# Patient Record
Sex: Female | Born: 1935 | Race: Black or African American | Hispanic: No | Smoking: Former smoker
Health system: Southern US, Community
[De-identification: ages and names within clinical notes are randomized; demographics above are authoritative.]

## PROBLEM LIST (undated history)

## (undated) DIAGNOSIS — I639 Cerebral infarction, unspecified: Secondary | ICD-10-CM

## (undated) DIAGNOSIS — E119 Type 2 diabetes mellitus without complications: Secondary | ICD-10-CM

## (undated) DIAGNOSIS — I10 Essential (primary) hypertension: Secondary | ICD-10-CM

---

## 2000-04-07 ENCOUNTER — Encounter: Payer: Self-pay | Admitting: Neurosurgery

## 2000-04-07 ENCOUNTER — Inpatient Hospital Stay (HOSPITAL_COMMUNITY): Admission: RE | Admit: 2000-04-07 | Discharge: 2000-04-08 | Payer: Self-pay | Admitting: Neurosurgery

## 2000-05-10 ENCOUNTER — Ambulatory Visit (HOSPITAL_COMMUNITY): Admission: RE | Admit: 2000-05-10 | Discharge: 2000-05-10 | Payer: Self-pay | Admitting: Neurosurgery

## 2000-05-10 ENCOUNTER — Encounter: Payer: Self-pay | Admitting: Neurosurgery

## 2006-04-11 ENCOUNTER — Ambulatory Visit: Payer: Self-pay | Admitting: Ophthalmology

## 2006-04-17 ENCOUNTER — Ambulatory Visit: Payer: Self-pay | Admitting: Ophthalmology

## 2006-12-21 ENCOUNTER — Other Ambulatory Visit: Payer: Self-pay

## 2006-12-21 ENCOUNTER — Inpatient Hospital Stay: Payer: Self-pay | Admitting: Internal Medicine

## 2007-01-01 ENCOUNTER — Ambulatory Visit: Payer: Self-pay | Admitting: Ophthalmology

## 2007-01-08 ENCOUNTER — Ambulatory Visit: Payer: Self-pay | Admitting: Ophthalmology

## 2008-03-24 ENCOUNTER — Other Ambulatory Visit: Payer: Self-pay

## 2008-03-24 ENCOUNTER — Emergency Department: Payer: Self-pay | Admitting: Emergency Medicine

## 2009-04-21 ENCOUNTER — Inpatient Hospital Stay: Payer: Self-pay | Admitting: Internal Medicine

## 2013-05-23 ENCOUNTER — Ambulatory Visit: Payer: Self-pay

## 2013-06-18 ENCOUNTER — Ambulatory Visit: Payer: Self-pay

## 2014-02-04 ENCOUNTER — Emergency Department: Payer: Self-pay | Admitting: Emergency Medicine

## 2014-02-04 LAB — CBC WITH DIFFERENTIAL/PLATELET
Basophil #: 0 10*3/uL (ref 0.0–0.1)
Basophil %: 1.2 %
Eosinophil #: 0.1 10*3/uL (ref 0.0–0.7)
Eosinophil %: 2.9 %
HCT: 35.8 % (ref 35.0–47.0)
HGB: 11.5 g/dL — ABNORMAL LOW (ref 12.0–16.0)
Lymphocyte #: 1.5 10*3/uL (ref 1.0–3.6)
Lymphocyte %: 35.8 %
MCH: 29.7 pg (ref 26.0–34.0)
MCHC: 32.1 g/dL (ref 32.0–36.0)
MCV: 92 fL (ref 80–100)
Monocyte #: 0.3 x10 3/mm (ref 0.2–0.9)
Monocyte %: 6.3 %
Neutrophil #: 2.3 10*3/uL (ref 1.4–6.5)
Neutrophil %: 53.8 %
Platelet: 174 10*3/uL (ref 150–440)
RBC: 3.88 10*6/uL (ref 3.80–5.20)
RDW: 15.5 % — ABNORMAL HIGH (ref 11.5–14.5)
WBC: 4.3 10*3/uL (ref 3.6–11.0)

## 2014-02-04 LAB — URINALYSIS, COMPLETE
Glucose,UR: >=500 mg/dL (ref 0–75)
Ketone: NEGATIVE
Nitrite: NEGATIVE
Ph: 7 (ref 4.5–8.0)
Protein: 30
RBC,UR: 1 /HPF (ref 0–5)
Specific Gravity: 1.005 (ref 1.003–1.030)
Squamous Epithelial: 1
WBC UR: 4 /HPF (ref 0–5)

## 2014-02-04 LAB — BASIC METABOLIC PANEL
Anion Gap: 4 — ABNORMAL LOW (ref 7–16)
BUN: 12 mg/dL (ref 7–18)
Calcium, Total: 9.3 mg/dL (ref 8.5–10.1)
Chloride: 108 mmol/L — ABNORMAL HIGH (ref 98–107)
Co2: 29 mmol/L (ref 21–32)
Creatinine: 1.06 mg/dL (ref 0.60–1.30)
EGFR (African American): 59 — ABNORMAL LOW
EGFR (Non-African Amer.): 51 — ABNORMAL LOW
Glucose: 225 mg/dL — ABNORMAL HIGH (ref 65–99)
Osmolality: 288 (ref 275–301)
Potassium: 4.3 mmol/L (ref 3.5–5.1)
Sodium: 141 mmol/L (ref 136–145)

## 2014-02-04 LAB — PRO B NATRIURETIC PEPTIDE: B-Type Natriuretic Peptide: 175 pg/mL (ref 0–450)

## 2014-02-04 LAB — TROPONIN I: Troponin-I: <0.02 ng/mL

## 2014-08-20 DIAGNOSIS — E1140 Type 2 diabetes mellitus with diabetic neuropathy, unspecified: Secondary | ICD-10-CM | POA: Insufficient documentation

## 2014-08-20 DIAGNOSIS — R42 Dizziness and giddiness: Secondary | ICD-10-CM | POA: Insufficient documentation

## 2014-08-20 DIAGNOSIS — R200 Anesthesia of skin: Secondary | ICD-10-CM | POA: Insufficient documentation

## 2014-08-20 DIAGNOSIS — E119 Type 2 diabetes mellitus without complications: Secondary | ICD-10-CM | POA: Insufficient documentation

## 2014-08-20 DIAGNOSIS — E669 Obesity, unspecified: Secondary | ICD-10-CM | POA: Insufficient documentation

## 2014-08-27 DIAGNOSIS — R262 Difficulty in walking, not elsewhere classified: Secondary | ICD-10-CM | POA: Insufficient documentation

## 2014-12-15 ENCOUNTER — Ambulatory Visit: Payer: Self-pay | Admitting: Internal Medicine

## 2014-12-29 ENCOUNTER — Encounter: Payer: Self-pay | Admitting: Internal Medicine

## 2015-01-21 DIAGNOSIS — Z8673 Personal history of transient ischemic attack (TIA), and cerebral infarction without residual deficits: Secondary | ICD-10-CM | POA: Insufficient documentation

## 2015-02-10 ENCOUNTER — Emergency Department: Payer: Self-pay | Admitting: Emergency Medicine

## 2015-02-19 ENCOUNTER — Ambulatory Visit: Payer: Self-pay | Admitting: Vascular Surgery

## 2015-05-20 ENCOUNTER — Ambulatory Visit: Payer: Medicare HMO | Attending: Internal Medicine

## 2015-05-20 DIAGNOSIS — E118 Type 2 diabetes mellitus with unspecified complications: Secondary | ICD-10-CM | POA: Insufficient documentation

## 2015-05-20 DIAGNOSIS — H539 Unspecified visual disturbance: Secondary | ICD-10-CM | POA: Insufficient documentation

## 2015-05-20 DIAGNOSIS — G4733 Obstructive sleep apnea (adult) (pediatric): Secondary | ICD-10-CM | POA: Diagnosis not present

## 2015-05-20 DIAGNOSIS — R400 Somnolence: Secondary | ICD-10-CM | POA: Diagnosis present

## 2015-08-31 DIAGNOSIS — E1165 Type 2 diabetes mellitus with hyperglycemia: Secondary | ICD-10-CM | POA: Diagnosis not present

## 2015-08-31 DIAGNOSIS — Z8673 Personal history of transient ischemic attack (TIA), and cerebral infarction without residual deficits: Secondary | ICD-10-CM | POA: Diagnosis not present

## 2015-08-31 DIAGNOSIS — I10 Essential (primary) hypertension: Secondary | ICD-10-CM | POA: Diagnosis not present

## 2015-08-31 DIAGNOSIS — H539 Unspecified visual disturbance: Secondary | ICD-10-CM | POA: Diagnosis not present

## 2015-08-31 DIAGNOSIS — E118 Type 2 diabetes mellitus with unspecified complications: Secondary | ICD-10-CM | POA: Diagnosis not present

## 2015-09-01 DIAGNOSIS — I10 Essential (primary) hypertension: Secondary | ICD-10-CM | POA: Diagnosis not present

## 2015-09-01 DIAGNOSIS — Z8673 Personal history of transient ischemic attack (TIA), and cerebral infarction without residual deficits: Secondary | ICD-10-CM | POA: Diagnosis not present

## 2015-09-01 DIAGNOSIS — Z794 Long term (current) use of insulin: Secondary | ICD-10-CM | POA: Diagnosis not present

## 2015-09-01 DIAGNOSIS — E118 Type 2 diabetes mellitus with unspecified complications: Secondary | ICD-10-CM | POA: Diagnosis not present

## 2015-09-01 DIAGNOSIS — H539 Unspecified visual disturbance: Secondary | ICD-10-CM | POA: Diagnosis not present

## 2015-09-03 DIAGNOSIS — I10 Essential (primary) hypertension: Secondary | ICD-10-CM | POA: Diagnosis not present

## 2015-09-03 DIAGNOSIS — Z794 Long term (current) use of insulin: Secondary | ICD-10-CM | POA: Diagnosis not present

## 2015-09-03 DIAGNOSIS — E118 Type 2 diabetes mellitus with unspecified complications: Secondary | ICD-10-CM | POA: Diagnosis not present

## 2015-09-03 DIAGNOSIS — Z8673 Personal history of transient ischemic attack (TIA), and cerebral infarction without residual deficits: Secondary | ICD-10-CM | POA: Diagnosis not present

## 2015-09-03 DIAGNOSIS — R262 Difficulty in walking, not elsewhere classified: Secondary | ICD-10-CM | POA: Diagnosis not present

## 2015-10-05 DIAGNOSIS — E1140 Type 2 diabetes mellitus with diabetic neuropathy, unspecified: Secondary | ICD-10-CM | POA: Diagnosis not present

## 2015-10-05 DIAGNOSIS — I119 Hypertensive heart disease without heart failure: Secondary | ICD-10-CM | POA: Diagnosis not present

## 2015-11-27 ENCOUNTER — Ambulatory Visit
Admission: RE | Admit: 2015-11-27 | Discharge: 2015-11-27 | Disposition: A | Discharge status: Home / Self Care | Payer: Commercial Managed Care - HMO | Source: Ambulatory Visit | Attending: Physician Assistant | Admitting: Physician Assistant

## 2015-11-27 ENCOUNTER — Other Ambulatory Visit: Payer: Self-pay | Admitting: Physician Assistant

## 2015-11-27 DIAGNOSIS — R600 Localized edema: Secondary | ICD-10-CM | POA: Insufficient documentation

## 2015-11-27 DIAGNOSIS — M79604 Pain in right leg: Secondary | ICD-10-CM

## 2016-01-01 DIAGNOSIS — Z8673 Personal history of transient ischemic attack (TIA), and cerebral infarction without residual deficits: Secondary | ICD-10-CM | POA: Diagnosis not present

## 2016-01-01 DIAGNOSIS — I10 Essential (primary) hypertension: Secondary | ICD-10-CM | POA: Diagnosis not present

## 2016-01-01 DIAGNOSIS — E118 Type 2 diabetes mellitus with unspecified complications: Secondary | ICD-10-CM | POA: Diagnosis not present

## 2016-01-01 DIAGNOSIS — R262 Difficulty in walking, not elsewhere classified: Secondary | ICD-10-CM | POA: Diagnosis not present

## 2016-01-19 DIAGNOSIS — I10 Essential (primary) hypertension: Secondary | ICD-10-CM | POA: Diagnosis not present

## 2016-01-19 DIAGNOSIS — N183 Chronic kidney disease, stage 3 (moderate): Secondary | ICD-10-CM | POA: Diagnosis not present

## 2016-01-19 DIAGNOSIS — Z794 Long term (current) use of insulin: Secondary | ICD-10-CM | POA: Diagnosis not present

## 2016-01-19 DIAGNOSIS — R200 Anesthesia of skin: Secondary | ICD-10-CM | POA: Diagnosis not present

## 2016-01-19 DIAGNOSIS — E1122 Type 2 diabetes mellitus with diabetic chronic kidney disease: Secondary | ICD-10-CM | POA: Diagnosis not present

## 2016-01-19 DIAGNOSIS — Z8673 Personal history of transient ischemic attack (TIA), and cerebral infarction without residual deficits: Secondary | ICD-10-CM | POA: Diagnosis not present

## 2016-01-19 DIAGNOSIS — E118 Type 2 diabetes mellitus with unspecified complications: Secondary | ICD-10-CM | POA: Diagnosis not present

## 2016-01-19 DIAGNOSIS — I739 Peripheral vascular disease, unspecified: Secondary | ICD-10-CM | POA: Diagnosis not present

## 2016-02-02 DIAGNOSIS — E113553 Type 2 diabetes mellitus with stable proliferative diabetic retinopathy, bilateral: Secondary | ICD-10-CM | POA: Diagnosis not present

## 2016-02-25 DIAGNOSIS — M6281 Muscle weakness (generalized): Secondary | ICD-10-CM | POA: Diagnosis not present

## 2016-02-26 ENCOUNTER — Emergency Department
Admission: EM | Admit: 2016-02-26 | Discharge: 2016-02-26 | Disposition: A | Discharge status: Home / Self Care | Payer: Commercial Managed Care - HMO | Attending: Emergency Medicine | Admitting: Emergency Medicine

## 2016-02-26 ENCOUNTER — Emergency Department: Payer: Commercial Managed Care - HMO

## 2016-02-26 ENCOUNTER — Encounter: Payer: Self-pay | Admitting: Emergency Medicine

## 2016-02-26 DIAGNOSIS — S80212A Abrasion, left knee, initial encounter: Secondary | ICD-10-CM | POA: Insufficient documentation

## 2016-02-26 DIAGNOSIS — Y998 Other external cause status: Secondary | ICD-10-CM | POA: Insufficient documentation

## 2016-02-26 DIAGNOSIS — W1839XA Other fall on same level, initial encounter: Secondary | ICD-10-CM | POA: Insufficient documentation

## 2016-02-26 DIAGNOSIS — S0990XA Unspecified injury of head, initial encounter: Secondary | ICD-10-CM | POA: Diagnosis not present

## 2016-02-26 DIAGNOSIS — W19XXXA Unspecified fall, initial encounter: Secondary | ICD-10-CM

## 2016-02-26 DIAGNOSIS — Y92009 Unspecified place in unspecified non-institutional (private) residence as the place of occurrence of the external cause: Secondary | ICD-10-CM | POA: Insufficient documentation

## 2016-02-26 DIAGNOSIS — E119 Type 2 diabetes mellitus without complications: Secondary | ICD-10-CM | POA: Insufficient documentation

## 2016-02-26 DIAGNOSIS — R531 Weakness: Secondary | ICD-10-CM | POA: Insufficient documentation

## 2016-02-26 DIAGNOSIS — Z87891 Personal history of nicotine dependence: Secondary | ICD-10-CM | POA: Insufficient documentation

## 2016-02-26 DIAGNOSIS — I10 Essential (primary) hypertension: Secondary | ICD-10-CM | POA: Insufficient documentation

## 2016-02-26 DIAGNOSIS — I6782 Cerebral ischemia: Secondary | ICD-10-CM | POA: Diagnosis not present

## 2016-02-26 DIAGNOSIS — Y9389 Activity, other specified: Secondary | ICD-10-CM | POA: Insufficient documentation

## 2016-02-26 HISTORY — DX: Essential (primary) hypertension: I10

## 2016-02-26 HISTORY — DX: Type 2 diabetes mellitus without complications: E11.9

## 2016-02-26 HISTORY — DX: Cerebral infarction, unspecified: I63.9

## 2016-02-26 LAB — URINALYSIS COMPLETE WITH MICROSCOPIC (ARMC ONLY)
Bacteria, UA: NONE SEEN
Bilirubin Urine: NEGATIVE
Glucose, UA: >500 mg/dL — AB
KETONES UR: NEGATIVE mg/dL
LEUKOCYTES UA: NEGATIVE
Nitrite: NEGATIVE
PH: 6 (ref 5.0–8.0)
PROTEIN: NEGATIVE mg/dL
SPECIFIC GRAVITY, URINE: 1.007 (ref 1.005–1.030)

## 2016-02-26 LAB — CBC
HCT: 38.4 % (ref 35.0–47.0)
Hemoglobin: 12.6 g/dL (ref 12.0–16.0)
MCH: 29.7 pg (ref 26.0–34.0)
MCHC: 32.9 g/dL (ref 32.0–36.0)
MCV: 90.3 fL (ref 80.0–100.0)
PLATELETS: 167 10*3/uL (ref 150–440)
RBC: 4.26 MIL/uL (ref 3.80–5.20)
RDW: 15 % — AB (ref 11.5–14.5)
WBC: 4.7 10*3/uL (ref 3.6–11.0)

## 2016-02-26 LAB — COMPREHENSIVE METABOLIC PANEL
ALBUMIN: 3.6 g/dL (ref 3.5–5.0)
ALT: 17 U/L (ref 14–54)
AST: 20 U/L (ref 15–41)
Alkaline Phosphatase: 84 U/L (ref 38–126)
Anion gap: 5 (ref 5–15)
BUN: 23 mg/dL — AB (ref 6–20)
CHLORIDE: 104 mmol/L (ref 101–111)
CO2: 30 mmol/L (ref 22–32)
Calcium: 9.3 mg/dL (ref 8.9–10.3)
Creatinine, Ser: 1.08 mg/dL — ABNORMAL HIGH (ref 0.44–1.00)
GFR calc Af Amer: 55 mL/min — ABNORMAL LOW (ref >60)
GFR calc non Af Amer: 47 mL/min — ABNORMAL LOW (ref >60)
GLUCOSE: 126 mg/dL — AB (ref 65–99)
POTASSIUM: 4 mmol/L (ref 3.5–5.1)
Sodium: 139 mmol/L (ref 135–145)
Total Bilirubin: 0.7 mg/dL (ref 0.3–1.2)
Total Protein: 7.6 g/dL (ref 6.5–8.1)

## 2016-02-26 LAB — TROPONIN I
Troponin I: 0.04 ng/mL — ABNORMAL HIGH (ref <0.031)
Troponin I: 0.03 ng/mL (ref <0.031)

## 2016-02-26 NOTE — Discharge Instructions (Signed)
Weakness °Weakness is a lack of strength. You may feel weak all over your body or just in one part of your body. Weakness can be serious. In some cases, you may need more medical tests. °HOME CARE °· Rest. °· Eat a well-balanced diet. °· Try to exercise every day. °· Only take medicines as told by your doctor. °GET HELP RIGHT AWAY IF:  °· You cannot do your normal daily activities. °· You cannot walk up and down stairs, or you feel very tired when you do so. °· You have shortness of breath or chest pain. °· You have trouble moving parts of your body. °· You have weakness in only one body part or on only one side of the body. °· You have a fever. °· You have trouble speaking or swallowing. °· You cannot control when you pee (urinate) or poop (bowel movement). °· You have black or bloody throw up (vomit) or poop. °· Your weakness gets worse or spreads to other body parts. °· You have new aches or pains. °MAKE SURE YOU:  °· Understand these instructions. °· Will watch your condition. °· Will get help right away if you are not doing well or get worse. °  °This information is not intended to replace advice given to you by your health care provider. Make sure you discuss any questions you have with your health care provider. °  °Document Released: 11/17/2008 Document Revised: 06/05/2012 Document Reviewed: 02/03/2012 °Elsevier Interactive Patient Education ©2016 [employer] ° °

## 2016-02-26 NOTE — ED Notes (Signed)
Pts daughter called for pickup and wheelchair.

## 2016-02-26 NOTE — ED Notes (Signed)
Crystal from [employer] here to take pt home, awaiting clean scrubs from supply chain

## 2016-02-26 NOTE — ED Notes (Signed)
Pt arrived by EMS from home post fall and generalized weakness. EMS reports pt fell tonight onto wooden floor, pt and pts daughter were unable to get out of floor, EMS was called. Pt reports she has had generalized weakness, mostly in legs and knees. EMS states pt had a stroke 2 months ago. Pt is 98% RA on arrival.

## 2016-02-26 NOTE — ED Notes (Signed)
Continuing to attempt to reach daughter for pt's discharge

## 2016-02-26 NOTE — ED Notes (Signed)
Dr. Gwinda PassePadachowski aware of BP readings.

## 2016-02-26 NOTE — ED Notes (Signed)
Pt resting quietly, no distress noted, cont to monitor 

## 2016-02-26 NOTE — ED Provider Notes (Signed)
[facility] Emergency Department Provider Note  Time seen: 1:37 AM  I have reviewed the triage vital signs and the nursing notes.   HISTORY  Chief Complaint Fall and Weakness    HPI [name] [name] is a 82 y.o. female with a past medical history of diabetes, hypertension, CVA 5 months ago affecting her left side presents the emergency department after a fall. According to the patient for the past 5 months since her stroke she has continued to feel weak on the left side. Patient states today she was attempting to walk when she fell down to her knees. Denies hitting her head or any loss of consciousness. Patient's family was unable to get her up off the floor so they called EMS. Patient denies any new weakness, states her weakness has not improved since her stroke. She saw her primary care physician on Monday, and they were discussing possible rehabilitation or home health per patient.Patient denies any headache, chest or abdominal pain. She does state mild left knee pain.     Past Medical History  Diagnosis Date  . Stroke (HCC)   . Diabetes mellitus without complication (HCC)   . Hypertension     There are no active problems to display for this patient.   History reviewed. No pertinent past surgical history.  No current outpatient prescriptions on file.  Allergies Review of patient's allergies indicates not on file.  History reviewed. No pertinent family history.  Social History Social History  Substance Use Topics  . Smoking status: Former Smoker    Types: Cigarettes  . Smokeless tobacco: None  . Alcohol Use: No    Review of Systems Constitutional: Negative for fever. Cardiovascular: Negative for chest pain. Respiratory: Negative for shortness of breath. Gastrointestinal: Negative for abdominal pain Musculoskeletal: Mild left knee pain Skin: Abrasion to left knee Neurological: Negative for . Weakness on the left side which she states is  unchanged since her stroke approximate 5 months ago 10-point ROS otherwise negative.  ____________________________________________   PHYSICAL EXAM:  VITAL SIGNS: ED Triage Vitals  Enc Vitals Group     BP 02/26/16 0123 173/99 mmHg     Pulse Rate 02/26/16 0123 87     Resp 02/26/16 0123 16     Temp 02/26/16 0123 98.4 F (36.9 C)     Temp Source 02/26/16 0123 Oral     SpO2 02/26/16 0113 95 %     Weight 02/26/16 0123 165 lb (74.844 kg)     Height 02/26/16 0123 5\' 2"  (1.575 m)     Head Cir --      Peak Flow --      Pain Score --      Pain Loc --      Pain Edu? --      Excl. in [place]? --     Constitutional: Alert.  Well appearing and in no distress. Eyes: Normal exam ENT   Head: Normocephalic and atraumatic.   Mouth/Throat: Mucous membranes are moist. Cardiovascular: Normal rate, regular rhythm Respiratory: Normal respiratory effort without tachypnea nor retractions. Breath sounds are clear Gastrointestinal: Soft and nontender. No distention. Musculoskeletal: Mild abrasion to left knee, no effusion palpated, good range of motion without tenderness. Stable pelvis, good range of motion bilateral hips. Neurologic:  Normal speech and language. 4/5 motor in left upper and 3/5 motor in left lower extremity which she states is unchanged since her stroke. Speech is normal. Skin:  Skin is warm, dry and intact.  Psychiatric: Mood and affect  are normal. Speech and behavior are normal.   ____________________________________________    EKG  EKG reviewed and interpreted by myself shows normal sinus rhythm at 88 bpm, widened QRS, left axis deviation, prolonged PR interval, nonspecific ST changes. No ST elevations.  ____________________________________________    RADIOLOGY  CT shows no acute abnormality  ____________________________________________    INITIAL IMPRESSION / ASSESSMENT AND PLAN / ED COURSE  Pertinent labs & imaging results that were available during my care of  the patient were reviewed by me and considered in my medical decision making (see chart for details).  Patient presents the emergency department after a fall complaining of weakness however she states the weakness is unchanged since her stroke. We will check a CT head, labs including urinalysis and closely monitor in the emergency department. Patient has a mild abrasion to left knee, otherwise nontender with good range of motion. Do not suspect any bony abnormalities. No other signs of trauma.  CT shows no acute abnormality. Troponin slightly elevated 0.04. Patient states she feels well. We'll repeat a troponin 2 hours after initial labs. If within normal limits we'll discharge the patient home, patient able to plan.  Repeat troponin negative. We'll discharge the patient home. Patient agreeable plan.  ____________________________________________   FINAL CLINICAL IMPRESSION(S) / ED DIAGNOSES  Fall Left-sided weakness   Minna Antis, MD 02/26/16 [phone]

## 2016-02-26 NOTE — ED Notes (Signed)
Pt states that she has a girl who comes to help her from [employer] who is supposed to be getting to her house now, pt states that if we are able to reach her or her company she feels certain she would either wake her daughter or come get her herselg, phone call made

## 2016-02-26 NOTE — ED Notes (Signed)
Continuing to attempt to reach daughter

## 2016-04-02 ENCOUNTER — Emergency Department: Payer: Commercial Managed Care - HMO

## 2016-04-02 ENCOUNTER — Emergency Department
Admission: EM | Admit: 2016-04-02 | Discharge: 2016-04-03 | Disposition: A | Discharge status: Home / Self Care | Payer: Commercial Managed Care - HMO | Attending: Emergency Medicine | Admitting: Emergency Medicine

## 2016-04-02 ENCOUNTER — Encounter: Payer: Self-pay | Admitting: *Deleted

## 2016-04-02 DIAGNOSIS — I10 Essential (primary) hypertension: Secondary | ICD-10-CM | POA: Diagnosis not present

## 2016-04-02 DIAGNOSIS — H539 Unspecified visual disturbance: Secondary | ICD-10-CM | POA: Diagnosis not present

## 2016-04-02 DIAGNOSIS — Z794 Long term (current) use of insulin: Secondary | ICD-10-CM | POA: Diagnosis not present

## 2016-04-02 DIAGNOSIS — Z79899 Other long term (current) drug therapy: Secondary | ICD-10-CM | POA: Diagnosis not present

## 2016-04-02 DIAGNOSIS — H578 Other specified disorders of eye and adnexa: Secondary | ICD-10-CM | POA: Insufficient documentation

## 2016-04-02 DIAGNOSIS — Z8673 Personal history of transient ischemic attack (TIA), and cerebral infarction without residual deficits: Secondary | ICD-10-CM | POA: Diagnosis not present

## 2016-04-02 DIAGNOSIS — H542 Low vision, both eyes: Secondary | ICD-10-CM | POA: Diagnosis present

## 2016-04-02 DIAGNOSIS — S0990XA Unspecified injury of head, initial encounter: Secondary | ICD-10-CM | POA: Diagnosis not present

## 2016-04-02 DIAGNOSIS — E119 Type 2 diabetes mellitus without complications: Secondary | ICD-10-CM | POA: Insufficient documentation

## 2016-04-02 DIAGNOSIS — Z87891 Personal history of nicotine dependence: Secondary | ICD-10-CM | POA: Insufficient documentation

## 2016-04-02 DIAGNOSIS — H538 Other visual disturbances: Secondary | ICD-10-CM | POA: Diagnosis not present

## 2016-04-02 LAB — BASIC METABOLIC PANEL
Anion gap: 7 (ref 5–15)
BUN: 19 mg/dL (ref 6–20)
CHLORIDE: 110 mmol/L (ref 101–111)
CO2: 23 mmol/L (ref 22–32)
CREATININE: 1.03 mg/dL — AB (ref 0.44–1.00)
Calcium: 9.1 mg/dL (ref 8.9–10.3)
GFR calc Af Amer: 58 mL/min — ABNORMAL LOW (ref >60)
GFR calc non Af Amer: 50 mL/min — ABNORMAL LOW (ref >60)
GLUCOSE: 92 mg/dL (ref 65–99)
Potassium: 4.3 mmol/L (ref 3.5–5.1)
Sodium: 140 mmol/L (ref 135–145)

## 2016-04-02 LAB — CBC
HCT: 37.6 % (ref 35.0–47.0)
Hemoglobin: 12.5 g/dL (ref 12.0–16.0)
MCH: 30.4 pg (ref 26.0–34.0)
MCHC: 33.1 g/dL (ref 32.0–36.0)
MCV: 91.9 fL (ref 80.0–100.0)
PLATELETS: 163 10*3/uL (ref 150–440)
RBC: 4.09 MIL/uL (ref 3.80–5.20)
RDW: 15.8 % — AB (ref 11.5–14.5)
WBC: 3.6 10*3/uL (ref 3.6–11.0)

## 2016-04-02 LAB — SEDIMENTATION RATE: Sed Rate: 38 mm/hr — ABNORMAL HIGH (ref 0–30)

## 2016-04-02 NOTE — ED Notes (Signed)
Pt soiled self, cleaned up and changed, given new sheets. Tolerated well NAD noted.

## 2016-04-02 NOTE — ED Notes (Signed)
Lab called, purple top hemolyzed. Must redraw.

## 2016-04-02 NOTE — ED Notes (Addendum)
Pt to ED from home via EMS with vision changes, per pt, "I just see some dots everywhere" Upon arrival, pt AAOx2, baseline cognition per EMS. Denies chest pain, SOB, HA or any other complaints/pain. Pt with hx of stoke in past, baseline left sided weakness. No neuro deficits noted. Vitals stable. NAD noted.

## 2016-04-02 NOTE — ED Notes (Signed)
MD Quale at bedside. 

## 2016-04-03 NOTE — ED Provider Notes (Signed)
-----------------------------------------   1:09 AM on 04/03/2016 -----------------------------------------  CT head without contrast interpreted per Dr. Clovis RileyMitchell: No acute intracranial findings. There is remote left occipital infarction. There are remote lacunar infarctions in the right globus pallidus and in the pons. There is unchanged generalized atrophy and chronic small vessel disease.  Updated patient on CT results. Plan for discharge and ophthalmology follow-up as per Dr. Fanny BienQuale. Strict return precautions given. Patient and family verbalizes understanding and agrees with plan of care.    [name] HongJade J Noraa Pickeral, MD 04/03/16 [phone]

## 2016-04-03 NOTE — Discharge Instructions (Signed)
Visual Disturbances °You have had a disturbance in your vision. This may be caused by various conditions, such as: °· Migraines. Migraine headaches are often preceded by a disturbance in vision. Blind spots or light flashes are followed by a headache. This type of visual disturbance is temporary. It does not damage the eye. °· Glaucoma. This is caused by increased pressure in the eye. Symptoms include haziness, blurred vision, or seeing rainbow colored circles when looking at bright lights. Partial or complete visual loss can occur. You may or may not experience eye pain. Visual loss may be gradual or sudden and is irreversible. Glaucoma is the leading cause of blindness. °· Retina problems. Vision will be reduced if the retina becomes detached or if there is a circulation problem as with diabetes, high blood pressure, or a mini-stroke. Symptoms include seeing "floaters," flashes of light, or shadows, as if a curtain has fallen over your eye. °· Optic nerve problems. The main nerve in your eye can be damaged by redness, soreness, and swelling (inflammation), poor circulation, drugs, and toxins. °It is very important to have a complete exam done by a specialist to determine the exact cause of your eye problem. The specialist may recommend medicines or surgery, depending on the cause of the problem. This can help prevent further loss of vision or reduce the risk of having a stroke. Contact the caregiver to whom you have been referred and arrange for follow-up care right away. °SEEK IMMEDIATE MEDICAL CARE IF:  °· Your vision gets worse. °· You develop severe headaches. °· You have any weakness or numbness in the face, arms, or legs. °· You have any trouble speaking or walking. °  °This information is not intended to replace advice given to you by your health care provider. Make sure you discuss any questions you have with your health care provider. °  °Document Released: 01/12/2005 Document Revised: 02/27/2012 Document  Reviewed: 05/14/2014 °Elsevier Interactive Patient Education ©2016 Elsevier Inc. ° °

## 2016-04-03 NOTE — ED Provider Notes (Addendum)
[facility] Emergency Department Provider Note  ____________________________________________  Time seen: Approximately 12:11 AM  I have reviewed the triage vital signs and the nursing notes.   HISTORY  Chief Complaint Eye Problem    HPI [name] [name] is a 82 y.o. female reports that she started seeing "spots" in the eyes around 8:00. She tells me that she has severe problems with poor vision and "almost blind" at baseline, today she started no 7 she looks at lights that she sees some spots in the distance. She denies loss of vision or that she cannot see. Just seeing spots occasionally, they are not always consistent or persistent.  Denies headache. No nausea or vomiting. No fever. No injury. No numbness weakness or tingling except for some chronic left-sided weakness from a previous stroke.  No trouble speaking  Past Medical History  Diagnosis Date  . Stroke ([place])   . Diabetes mellitus without complication ([place])   . Hypertension     There are no active problems to display for this patient.   History reviewed. No pertinent past surgical history.  Current Outpatient Rx  Name  Route  Sig  Dispense  Refill  . atorvastatin (LIPITOR) 10 MG tablet   Oral   Take 1 tablet by mouth daily.         Marland Kitchen gabapentin (NEURONTIN) 100 MG capsule   Oral   Take 1 capsule by mouth daily.         Marland Kitchen KLOR-CON M20 20 MEQ tablet   Oral   Take 1 tablet by mouth daily.           Dispense as written.   Marland Kitchen NOVOLIN 70/30 (70-30) 100 UNIT/ML injection   Subcutaneous   Inject 45 Units into the skin every morning.      4     Dispense as written.   . Vitamin D, Ergocalciferol, (DRISDOL) 50000 units CAPS capsule   Oral   Take 1 capsule by mouth once a week.           Allergies Review of patient's allergies indicates no known allergies.  History reviewed. No pertinent family history.  Social History Social History  Substance Use Topics  . Smoking  status: Former Smoker    Types: Cigarettes  . Smokeless tobacco: None  . Alcohol Use: No    Review of Systems Constitutional: No fever/chills Eyes:See history of present illness. No double vision ENT: No sore throat. Cardiovascular: Denies chest pain. Respiratory: Denies shortness of breath. Gastrointestinal: No abdominal pain.  No nausea, no vomiting.  No diarrhea.  No constipation. Genitourinary: Negative for dysuria. Musculoskeletal: Negative for back pain. Skin: Negative for rash. Neurological: Negative for headaches, focal weakness or numbness.  10-point ROS otherwise negative.  ____________________________________________   PHYSICAL EXAM:  VITAL SIGNS: ED Triage Vitals  Enc Vitals Group     BP 04/02/16 2105 169/90 mmHg     Pulse Rate 04/02/16 2105 86     Resp 04/02/16 2105 16     Temp 04/02/16 2105 98.7 F (37.1 C)     Temp Source 04/02/16 2105 Oral     SpO2 04/02/16 2105 96 %     Weight 04/02/16 2104 165 lb (74.844 kg)     Height 04/02/16 2104 5' 2" (1.575 m)     Head Cir --      Peak Flow --      Pain Score --      Pain Loc --      Pain  Edu? --      Excl. in [place]? --    Constitutional: Alert and oriented. Well appearing and in no acute distress. Eyes: Conjunctivae are normal. PERRL. EOMI. The patient is able to discriminate 2 fingers at arms length with both eyes, she does have trouble reading 12.5 at arms reach, but also reports that she has poor vision at baseline and this seems to be unchanged. The present time she is no longer seeing any spots. Her vision seems to be better now. Ophthalmological exam demonstrates no evidence of hemorrhage, the vessels of the retina are seen clearly. Head: Atraumatic. Nose: No congestion/rhinnorhea. Mouth/Throat: Mucous membranes are moist.  Oropharynx non-erythematous. Neck: No stridor.   Cardiovascular: Normal rate, regular rhythm. Grossly normal heart sounds.  Good peripheral circulation. Respiratory: Normal respiratory  effort.  No retractions. Lungs CTAB. Gastrointestinal: Soft and nontender. No distention.  Musculoskeletal: No lower extremity tenderness nor edema.  No joint effusions. Neurologic:  Normal speech and language. The patient has mild 4-5 strength on the left side in the arm and leg, but reports this is chronic since her previous stroke. She denies any new changes. She has good strength on the right side. Normal sensation. No pronator drift. No sensory deficits. Normal cranial nerve exam. Skin:  Skin is warm, dry and intact. No rash noted. Psychiatric: Mood and affect are normal. Speech and behavior are normal.  ____________________________________________   LABS (all labs ordered are listed, but only abnormal results are displayed)  Labs Reviewed  BASIC METABOLIC PANEL - Abnormal; Notable for the following:    Creatinine, Ser 1.03 (*)    GFR calc non Af Amer 50 (*)    GFR calc Af Amer 58 (*)    All other components within normal limits  CBC - Abnormal; Notable for the following:    RDW 15.8 (*)    All other components within normal limits  SEDIMENTATION RATE - Abnormal; Notable for the following:    Sed Rate 38 (*)    All other components within normal limits   ____________________________________________  EKG  Reviewed and interpreted by me at 2115 Ventricular rate 90 Here to 40 QRS 108 QTc 450 Reviewed and interpreted as left ventricular hypertrophy, no evidence of acute ST abnormality. Probable old inferior or anterior MI. ____________________________________________  RADIOLOGY  Pending at time of sign out ____________________________________________   PROCEDURES  Procedure(s) performed: None  Critical Care performed: No  ____________________________________________   INITIAL IMPRESSION / ASSESSMENT AND PLAN / ED COURSE  Pertinent labs & imaging results that were available during my care of the patient were reviewed by me and considered in my medical decision  making (see chart for details).  Patient presents for evaluation of changes in her vision including seeing spots. This seems to resolve now at the present time I find no evidence of a new visual deficit. She does have chronic left-sided weakness reports this is unchanged for months. She has no evidence of new or acute neurologic deficit. No evidence of retinal hemorrhage, and her acuity seems to be unchanged from previous based on her report.  No temporal artery tenderness, normal temporal artery sensation and ESR less than 40 making giant cell highly unlikely. He will proceed with CT of the head to evaluate for any possible new stroke, though the patient does not clearly report any new symptoms. At the present time her ophthalmologic exam is reassuring.  Discussed with the patient and her family, we will have her follow-up with ophthalmology early this  week. Careful return precautions advised.  Ongoing care assigned to Dr. Beather Arbour. Follow-up CT head, no evidence of a new stroke or acute abnormality would discharged home to follow-up with ophthalmology. ____________________________________________   FINAL CLINICAL IMPRESSION(S) / ED DIAGNOSES  Final diagnoses:  Vision changes      Delman Kitten, MD 04/03/16 0031  Delman Kitten, MD 04/14/16 [phone]

## 2016-04-05 DIAGNOSIS — Z8673 Personal history of transient ischemic attack (TIA), and cerebral infarction without residual deficits: Secondary | ICD-10-CM | POA: Diagnosis not present

## 2016-04-05 DIAGNOSIS — E1149 Type 2 diabetes mellitus with other diabetic neurological complication: Secondary | ICD-10-CM | POA: Diagnosis not present

## 2016-04-05 DIAGNOSIS — H5310 Unspecified subjective visual disturbances: Secondary | ICD-10-CM | POA: Diagnosis not present

## 2016-04-05 DIAGNOSIS — I10 Essential (primary) hypertension: Secondary | ICD-10-CM | POA: Diagnosis not present

## 2016-04-06 DIAGNOSIS — E113593 Type 2 diabetes mellitus with proliferative diabetic retinopathy without macular edema, bilateral: Secondary | ICD-10-CM | POA: Diagnosis not present

## 2016-08-01 DIAGNOSIS — E113593 Type 2 diabetes mellitus with proliferative diabetic retinopathy without macular edema, bilateral: Secondary | ICD-10-CM | POA: Diagnosis not present

## 2016-08-21 ENCOUNTER — Encounter: Payer: Self-pay | Admitting: Emergency Medicine

## 2016-08-21 ENCOUNTER — Inpatient Hospital Stay
Admission: EM | Admit: 2016-08-21 | Discharge: 2016-08-25 | DRG: 580 | Disposition: A | Discharge status: Skilled Nursing Facility | Payer: Commercial Managed Care - HMO | Attending: Internal Medicine | Admitting: Internal Medicine

## 2016-08-21 ENCOUNTER — Emergency Department: Payer: Commercial Managed Care - HMO

## 2016-08-21 DIAGNOSIS — N179 Acute kidney failure, unspecified: Secondary | ICD-10-CM | POA: Diagnosis present

## 2016-08-21 DIAGNOSIS — R918 Other nonspecific abnormal finding of lung field: Secondary | ICD-10-CM | POA: Diagnosis not present

## 2016-08-21 DIAGNOSIS — E1165 Type 2 diabetes mellitus with hyperglycemia: Secondary | ICD-10-CM | POA: Diagnosis not present

## 2016-08-21 DIAGNOSIS — T383X6A Underdosing of insulin and oral hypoglycemic [antidiabetic] drugs, initial encounter: Secondary | ICD-10-CM | POA: Diagnosis not present

## 2016-08-21 DIAGNOSIS — L02211 Cutaneous abscess of abdominal wall: Principal | ICD-10-CM | POA: Diagnosis present

## 2016-08-21 DIAGNOSIS — Z833 Family history of diabetes mellitus: Secondary | ICD-10-CM | POA: Diagnosis not present

## 2016-08-21 DIAGNOSIS — Z5189 Encounter for other specified aftercare: Secondary | ICD-10-CM | POA: Diagnosis not present

## 2016-08-21 DIAGNOSIS — R1312 Dysphagia, oropharyngeal phase: Secondary | ICD-10-CM | POA: Diagnosis not present

## 2016-08-21 DIAGNOSIS — Z823 Family history of stroke: Secondary | ICD-10-CM

## 2016-08-21 DIAGNOSIS — L0291 Cutaneous abscess, unspecified: Secondary | ICD-10-CM | POA: Diagnosis not present

## 2016-08-21 DIAGNOSIS — Z87891 Personal history of nicotine dependence: Secondary | ICD-10-CM

## 2016-08-21 DIAGNOSIS — B964 Proteus (mirabilis) (morganii) as the cause of diseases classified elsewhere: Secondary | ICD-10-CM | POA: Diagnosis not present

## 2016-08-21 DIAGNOSIS — J40 Bronchitis, not specified as acute or chronic: Secondary | ICD-10-CM | POA: Diagnosis present

## 2016-08-21 DIAGNOSIS — I10 Essential (primary) hypertension: Secondary | ICD-10-CM | POA: Diagnosis present

## 2016-08-21 DIAGNOSIS — Z981 Arthrodesis status: Secondary | ICD-10-CM | POA: Diagnosis not present

## 2016-08-21 DIAGNOSIS — M6281 Muscle weakness (generalized): Secondary | ICD-10-CM | POA: Diagnosis not present

## 2016-08-21 DIAGNOSIS — Z8673 Personal history of transient ischemic attack (TIA), and cerebral infarction without residual deficits: Secondary | ICD-10-CM

## 2016-08-21 DIAGNOSIS — E876 Hypokalemia: Secondary | ICD-10-CM | POA: Diagnosis not present

## 2016-08-21 DIAGNOSIS — E785 Hyperlipidemia, unspecified: Secondary | ICD-10-CM | POA: Diagnosis not present

## 2016-08-21 DIAGNOSIS — R739 Hyperglycemia, unspecified: Secondary | ICD-10-CM

## 2016-08-21 DIAGNOSIS — R0902 Hypoxemia: Secondary | ICD-10-CM | POA: Diagnosis present

## 2016-08-21 DIAGNOSIS — R41841 Cognitive communication deficit: Secondary | ICD-10-CM | POA: Diagnosis not present

## 2016-08-21 DIAGNOSIS — Z91128 Patient's intentional underdosing of medication regimen for other reason: Secondary | ICD-10-CM

## 2016-08-21 DIAGNOSIS — E119 Type 2 diabetes mellitus without complications: Secondary | ICD-10-CM | POA: Diagnosis not present

## 2016-08-21 DIAGNOSIS — Z794 Long term (current) use of insulin: Secondary | ICD-10-CM

## 2016-08-21 DIAGNOSIS — Y92009 Unspecified place in unspecified non-institutional (private) residence as the place of occurrence of the external cause: Secondary | ICD-10-CM | POA: Diagnosis not present

## 2016-08-21 DIAGNOSIS — R262 Difficulty in walking, not elsewhere classified: Secondary | ICD-10-CM | POA: Diagnosis not present

## 2016-08-21 DIAGNOSIS — J9691 Respiratory failure, unspecified with hypoxia: Secondary | ICD-10-CM | POA: Diagnosis not present

## 2016-08-21 LAB — BASIC METABOLIC PANEL
ANION GAP: 10 (ref 5–15)
BUN: 25 mg/dL — AB (ref 6–20)
CO2: 27 mmol/L (ref 22–32)
Calcium: 8.5 mg/dL — ABNORMAL LOW (ref 8.9–10.3)
Chloride: 96 mmol/L — ABNORMAL LOW (ref 101–111)
Creatinine, Ser: 1.24 mg/dL — ABNORMAL HIGH (ref 0.44–1.00)
GFR, EST AFRICAN AMERICAN: 46 mL/min — AB (ref >60)
GFR, EST NON AFRICAN AMERICAN: 40 mL/min — AB (ref >60)
Glucose, Bld: 525 mg/dL (ref 65–99)
POTASSIUM: 4.8 mmol/L (ref 3.5–5.1)
SODIUM: 133 mmol/L — AB (ref 135–145)

## 2016-08-21 LAB — GLUCOSE, CAPILLARY
Glucose-Capillary: 446 mg/dL — ABNORMAL HIGH (ref 65–99)
Glucose-Capillary: 520 mg/dL (ref 65–99)
Glucose-Capillary: 527 mg/dL (ref 65–99)

## 2016-08-21 LAB — PHOSPHORUS: PHOSPHORUS: 2.9 mg/dL (ref 2.5–4.6)

## 2016-08-21 LAB — CBC
HEMATOCRIT: 37.6 % (ref 35.0–47.0)
HEMOGLOBIN: 12.4 g/dL (ref 12.0–16.0)
MCH: 30.2 pg (ref 26.0–34.0)
MCHC: 33 g/dL (ref 32.0–36.0)
MCV: 91.3 fL (ref 80.0–100.0)
Platelets: 231 10*3/uL (ref 150–440)
RBC: 4.12 MIL/uL (ref 3.80–5.20)
RDW: 15.6 % — AB (ref 11.5–14.5)
WBC: 12.6 10*3/uL — AB (ref 3.6–11.0)

## 2016-08-21 LAB — MAGNESIUM: Magnesium: 2.2 mg/dL (ref 1.7–2.4)

## 2016-08-21 MED ORDER — ATORVASTATIN CALCIUM 20 MG PO TABS
10.0000 mg | ORAL_TABLET | Freq: Every day | ORAL | Status: DC
  Administered 2016-08-22 – 2016-08-25 (×4): 10 mg via ORAL
  Filled 2016-08-21 (×4): qty 1

## 2016-08-21 MED ORDER — SODIUM CHLORIDE 0.9 % IV BOLUS (SEPSIS)
1000.0000 mL | Freq: Once | INTRAVENOUS | Status: AC
  Administered 2016-08-21: 1000 mL via INTRAVENOUS

## 2016-08-21 MED ORDER — ONDANSETRON HCL 4 MG/2ML IJ SOLN
4.0000 mg | Freq: Four times a day (QID) | INTRAMUSCULAR | Status: DC | PRN
  Filled 2016-08-21: qty 2

## 2016-08-21 MED ORDER — ZOLPIDEM TARTRATE 5 MG PO TABS: 5.0000 mg | ORAL_TABLET | Freq: Every evening | ORAL | Status: DC | PRN

## 2016-08-21 MED ORDER — INSULIN ASPART PROT & ASPART (70-30 MIX) 100 UNIT/ML ~~LOC~~ SUSP: 45.0000 [IU] | Freq: Every day | SUBCUTANEOUS | Status: DC

## 2016-08-21 MED ORDER — ENOXAPARIN SODIUM 40 MG/0.4ML ~~LOC~~ SOLN
40.0000 mg | Freq: Every day | SUBCUTANEOUS | Status: DC
  Administered 2016-08-21 – 2016-08-24 (×4): 40 mg via SUBCUTANEOUS
  Filled 2016-08-21 (×4): qty 0.4

## 2016-08-21 MED ORDER — VANCOMYCIN HCL IN DEXTROSE 750-5 MG/150ML-% IV SOLN
750.0000 mg | INTRAVENOUS | Status: DC
  Administered 2016-08-22 – 2016-08-24 (×3): 750 mg via INTRAVENOUS
  Filled 2016-08-21 (×4): qty 150

## 2016-08-21 MED ORDER — SENNOSIDES-DOCUSATE SODIUM 8.6-50 MG PO TABS: 1.0000 | ORAL_TABLET | Freq: Every evening | ORAL | Status: DC | PRN

## 2016-08-21 MED ORDER — ACETAMINOPHEN 325 MG PO TABS: 650.0000 mg | ORAL_TABLET | Freq: Four times a day (QID) | ORAL | Status: DC | PRN

## 2016-08-21 MED ORDER — MAGNESIUM CITRATE PO SOLN: 1.0000 | Freq: Once | ORAL | Status: DC | PRN

## 2016-08-21 MED ORDER — VITAMIN D (ERGOCALCIFEROL) 1.25 MG (50000 UNIT) PO CAPS
50000.0000 [IU] | ORAL_CAPSULE | ORAL | Status: DC
  Administered 2016-08-22: 50000 [IU] via ORAL
  Filled 2016-08-21: qty 1

## 2016-08-21 MED ORDER — BISACODYL 5 MG PO TBEC: 5.0000 mg | DELAYED_RELEASE_TABLET | Freq: Every day | ORAL | Status: DC | PRN

## 2016-08-21 MED ORDER — POTASSIUM CHLORIDE CRYS ER 20 MEQ PO TBCR
20.0000 meq | EXTENDED_RELEASE_TABLET | Freq: Every day | ORAL | Status: DC
  Administered 2016-08-22 – 2016-08-25 (×4): 20 meq via ORAL
  Filled 2016-08-21 (×4): qty 1

## 2016-08-21 MED ORDER — ONDANSETRON HCL 4 MG PO TABS: 4.0000 mg | ORAL_TABLET | Freq: Four times a day (QID) | ORAL | Status: DC | PRN

## 2016-08-21 MED ORDER — INSULIN ASPART 100 UNIT/ML ~~LOC~~ SOLN
0.0000 [IU] | Freq: Three times a day (TID) | SUBCUTANEOUS | Status: DC
  Administered 2016-08-22: 5 [IU] via SUBCUTANEOUS
  Administered 2016-08-22: 11 [IU] via SUBCUTANEOUS
  Administered 2016-08-23: 2 [IU] via SUBCUTANEOUS
  Administered 2016-08-23: 8 [IU] via SUBCUTANEOUS
  Administered 2016-08-23: 5 [IU] via SUBCUTANEOUS
  Administered 2016-08-24: 8 [IU] via SUBCUTANEOUS
  Administered 2016-08-24: 2 [IU] via SUBCUTANEOUS
  Administered 2016-08-24: 5 [IU] via SUBCUTANEOUS
  Administered 2016-08-25: 8 [IU] via SUBCUTANEOUS
  Administered 2016-08-25: 5 [IU] via SUBCUTANEOUS
  Filled 2016-08-21: qty 5
  Filled 2016-08-21: qty 2
  Filled 2016-08-21: qty 11
  Filled 2016-08-21: qty 5
  Filled 2016-08-21 (×2): qty 8
  Filled 2016-08-21: qty 5
  Filled 2016-08-21: qty 8

## 2016-08-21 MED ORDER — ACETAMINOPHEN 650 MG RE SUPP: 650.0000 mg | Freq: Four times a day (QID) | RECTAL | Status: DC | PRN

## 2016-08-21 MED ORDER — SODIUM CHLORIDE 0.9 % IV SOLN
INTRAVENOUS | Status: DC
  Administered 2016-08-21: via INTRAVENOUS

## 2016-08-21 MED ORDER — INSULIN ASPART 100 UNIT/ML ~~LOC~~ SOLN
0.0000 [IU] | Freq: Every day | SUBCUTANEOUS | Status: DC
  Administered 2016-08-22: 3 [IU] via SUBCUTANEOUS
  Administered 2016-08-22: 4 [IU] via SUBCUTANEOUS
  Administered 2016-08-24: 3 [IU] via SUBCUTANEOUS
  Filled 2016-08-21: qty 3
  Filled 2016-08-21: qty 4
  Filled 2016-08-21: qty 3
  Filled 2016-08-21: qty 2

## 2016-08-21 MED ORDER — GABAPENTIN 100 MG PO CAPS
100.0000 mg | ORAL_CAPSULE | Freq: Every day | ORAL | Status: DC
Start: 2016-08-22 — End: 2016-08-25
  Administered 2016-08-22 – 2016-08-25 (×4): 100 mg via ORAL
  Filled 2016-08-21 (×4): qty 1

## 2016-08-21 MED ORDER — LIDOCAINE-EPINEPHRINE (PF) 1 %-1:200000 IJ SOLN
30.0000 mL | Freq: Once | INTRAMUSCULAR | Status: DC
Start: 2016-08-21 — End: 2016-08-25
  Filled 2016-08-21: qty 30

## 2016-08-21 MED ORDER — HYDROCODONE-ACETAMINOPHEN 5-325 MG PO TABS
1.0000 | ORAL_TABLET | ORAL | Status: DC | PRN
  Administered 2016-08-24: 1 via ORAL
  Filled 2016-08-21: qty 1
  Filled 2016-08-21: qty 2

## 2016-08-21 MED ORDER — VANCOMYCIN HCL IN DEXTROSE 1-5 GM/200ML-% IV SOLN
1000.0000 mg | Freq: Once | INTRAVENOUS | Status: AC
  Administered 2016-08-21: 1000 mg via INTRAVENOUS
  Filled 2016-08-21: qty 200

## 2016-08-21 MED ORDER — INSULIN ASPART 100 UNIT/ML ~~LOC~~ SOLN
15.0000 [IU] | Freq: Once | SUBCUTANEOUS | Status: AC
  Administered 2016-08-21: 15 [IU] via SUBCUTANEOUS
  Filled 2016-08-21: qty 15

## 2016-08-21 NOTE — ED Provider Notes (Signed)
[facility] Emergency Department Provider Note   ____________________________________________   I have reviewed the triage vital signs and the nursing notes.   HISTORY  Chief Complaint Abscess   History limited by: Not Limited   HPI [name] [name] is a 82 y.o. female with history of diabetes who presents to the emergency department today because of concerns for redness and swelling to her stomach. Patient states that this is been getting worse for the past week. It started after she injected herself with insulin in her stomach. It is painful. She states she has not given herself her insulin for the past 2 days because of this. It has started draining a small amount of fluid. She denies any fevers. No nausea or vomiting.   Past Medical History:  Diagnosis Date  . Diabetes mellitus without complication (HCC)   . Hypertension   . Stroke [facility])     There are no active problems to display for this patient.   History reviewed. No pertinent surgical history.  Prior to Admission medications   Medication Sig Start Date End Date Taking? Authorizing Provider  atorvastatin (LIPITOR) 10 MG tablet Take 1 tablet by mouth daily. 03/26/16   Historical Provider, MD  gabapentin (NEURONTIN) 100 MG capsule Take 1 capsule by mouth daily. 02/23/16   Historical Provider, MD  KLOR-CON M20 20 MEQ tablet Take 1 tablet by mouth daily. 02/23/16   Historical Provider, MD  NOVOLIN 70/30 (70-30) 100 UNIT/ML injection Inject 45 Units into the skin every morning. 01/28/16   Historical Provider, MD  Vitamin D, Ergocalciferol, (DRISDOL) 50000 units CAPS capsule Take 1 capsule by mouth once a week. 03/26/16   Historical Provider, MD    Allergies Review of patient's allergies indicates no known allergies.  History reviewed. No pertinent family history.  Social History Social History  Substance Use Topics  . Smoking status: Former Smoker    Types: Cigarettes  . Smokeless tobacco: Never  Used  . Alcohol use No    Review of Systems  Constitutional: Negative for fever. Cardiovascular: Negative for chest pain. Respiratory: Negative for shortness of breath. Gastrointestinal: Positive for abdominal pain. Genitourinary: Negative for dysuria. Musculoskeletal: Negative for back pain. Skin: Positive for redness and swelling to the abdomen.  Neurological: Negative for headaches, focal weakness or numbness.   10-point ROS otherwise negative.  ____________________________________________   PHYSICAL EXAM:  VITAL SIGNS: ED Triage Vitals  Enc Vitals Group     BP 08/21/16 1753 (!) 131/55     Pulse Rate 08/21/16 1753 91     Resp --      Temp 08/21/16 1753 98.1 F (36.7 C)     Temp Source 08/21/16 1753 Oral     SpO2 08/21/16 1753 (!) 89 %     Weight 08/21/16 1753 160 lb (72.6 kg)     Height 08/21/16 1753 5\' 2"  (1.575 m)     Head Circumference --      Peak Flow --      Pain Score 08/21/16 1801 0   Constitutional: Alert and oriented. Well appearing and in no distress. Eyes: Conjunctivae are normal. Normal extraocular movements. ENT   Head: Normocephalic and atraumatic.   Nose: No congestion/rhinnorhea.   Mouth/Throat: Mucous membranes are moist.   Neck: No stridor. Hematological/Lymphatic/Immunilogical: No cervical lymphadenopathy. Cardiovascular: Normal rate, regular rhythm.  No murmurs, rubs, or gallops. Respiratory: Normal respiratory effort without tachypnea nor retractions. Breath sounds are clear and equal bilaterally. No wheezes/rales/rhonchi. Gastrointestinal: Soft and nontender. No distention.  Genitourinary: Deferred Musculoskeletal: Normal range of motion in all extremities. No lower extremity edema. Neurologic:  Normal speech and language. No gross focal neurologic deficits are appreciated.  Skin:  Large area of redness, warmth and fluctuance just inferior to the umbilicus.  Psychiatric: Mood and affect are normal. Speech and behavior are  normal. Patient exhibits appropriate insight and judgment.  ____________________________________________    LABS (pertinent positives/negatives)  Labs Reviewed  BASIC METABOLIC PANEL - Abnormal; Notable for the following:       Result Value   Sodium 133 (*)    Chloride 96 (*)    Glucose, Bld 525 (*)    BUN 25 (*)    Creatinine, Ser 1.24 (*)    Calcium 8.5 (*)    GFR calc non Af Amer 40 (*)    GFR calc Af Amer 46 (*)    All other components within normal limits  CBC - Abnormal; Notable for the following:    WBC 12.6 (*)    RDW 15.6 (*)    All other components within normal limits  GLUCOSE, CAPILLARY - Abnormal; Notable for the following:    Glucose-Capillary 520 (*)    All other components within normal limits  CBG MONITORING, ED     ____________________________________________   EKG  None  ____________________________________________    RADIOLOGY  None  ____________________________________________   PROCEDURES  .Marland KitchenIncision and Drainage Date/Time: 08/21/2016 8:58 PM Performed by: Phineas Semen Authorized by: Phineas Semen   Consent:    Consent obtained:  Verbal   Consent given by:  Patient   Risks discussed:  Infection and pain Location:    Type:  Abscess   Size:  5 cm   Location:  Trunk   Trunk location:  Abdomen Pre-procedure details:    Skin preparation:  Chloraprep Anesthesia (see MAR for exact dosages):    Anesthesia method:  Local infiltration   Local anesthetic:  Lidocaine 1% WITH epi Procedure type:    Complexity:  Complex Procedure details:    Incision types:  Single straight   Incision depth:  Dermal   Scalpel blade:  11   Wound management:  Probed and deloculated and irrigated with saline   Drainage:  Purulent   Drainage amount:  Copious   Packing materials:  1/4 in iodoform gauze   Amount 1/4" iodoform:  15 cm Post-procedure details:    Patient tolerance of procedure:  Tolerated well, no immediate  complications    ____________________________________________   INITIAL IMPRESSION / ASSESSMENT AND PLAN / ED COURSE  Pertinent labs & imaging results that were available during my care of the patient were reviewed by me and considered in my medical decision making (see chart for details).  Patient presented to the emergency department today because of concerns for redness swelling and pain to her abdomen. Clinical exam was consistent with an abscess. This was incised and drained with a large amount of purulent material. This was packed. Patient was given dose of IV vancomycin. I think likely the infection and the fact the patient has not had her insulin the past couple of days is because of the hyperglycemia. No anion gap to suggest DKA. In addition the patient was found to be satting 88% on room air. Because of this patient was placed on 2 L nasal cannula and a chest x-ray will be obtained. ____________________________________________   FINAL CLINICAL IMPRESSION(S) / ED DIAGNOSES  Final diagnoses:  Abscess  Hyperglycemia  Hypoxia     Note: This dictation was prepared  with Office managerDragon dictation. Any transcriptional errors that result from this process are unintentional    Phineas SemenGraydon Jmya Uliano, MD 08/21/16 2102

## 2016-08-21 NOTE — ED Triage Notes (Signed)
Pt arrived to ED via POV with abscess around umbilicus. Pt gets insulin shots in her abdomen and developed redness and abscess just below umbilicus.  Area is firm and pt states some drainage has been present.

## 2016-08-21 NOTE — Progress Notes (Signed)
Pharmacy Antibiotic Note  [name] [name] is a 82 y.o. female admitted on 08/21/2016 with cellulitis with abscess.  Pharmacy has been consulted for vancomycin dosing.  Plan: Vancomycin 1 gm IV x 1 in ED followed in approximately 16 hours (stacked dosing) by vancomycin 750 mg IV Q24, predicted trough 16 mcg/mL. Pharmacy will continue to follow and adjust as needed to maintain trough 15 to 20 mcg/mL.   Vd 41.2 L, Ke 0.032 hr-1, T1/2 21.5 hr  Height: 5\' 2"  (157.5 cm) Weight: 160 lb (72.6 kg) IBW/kg (Calculated) : 50.1  Temp (24hrs), Avg:98.4 F (36.9 C), Min:98.1 F (36.7 C), Max:98.6 F (37 C)   Recent Labs Lab 08/21/16 1818  WBC 12.6*  CREATININE 1.24*    Estimated Creatinine Clearance (by C-G formula based on SCr of 1.24 mg/dL) Female: 16.133.8 mL/min Female: 41.5 mL/min    No Known Allergies  Thank you for allowing pharmacy to be a part of this patient's care.  Carola FrostNathan A Khamari Yousuf, Pharm.D., BCPS Clinical Pharmacist 08/21/2016 11:33 PM

## 2016-08-21 NOTE — ED Notes (Signed)
Report called to floor, given to Nicki Guadalajararicia, Charity fundraiserN.

## 2016-08-21 NOTE — ED Notes (Signed)
Admitting MD paged regarding glucose.

## 2016-08-22 ENCOUNTER — Inpatient Hospital Stay: Payer: Commercial Managed Care - HMO

## 2016-08-22 ENCOUNTER — Encounter: Payer: Self-pay | Admitting: *Deleted

## 2016-08-22 DIAGNOSIS — J9691 Respiratory failure, unspecified with hypoxia: Secondary | ICD-10-CM

## 2016-08-22 LAB — BASIC METABOLIC PANEL
ANION GAP: 2 — AB (ref 5–15)
BUN: 27 mg/dL — ABNORMAL HIGH (ref 6–20)
CO2: 30 mmol/L (ref 22–32)
Calcium: 8.1 mg/dL — ABNORMAL LOW (ref 8.9–10.3)
Chloride: 107 mmol/L (ref 101–111)
Creatinine, Ser: 1.08 mg/dL — ABNORMAL HIGH (ref 0.44–1.00)
GFR, EST AFRICAN AMERICAN: 55 mL/min — AB (ref >60)
GFR, EST NON AFRICAN AMERICAN: 47 mL/min — AB (ref >60)
GLUCOSE: 54 mg/dL — AB (ref 65–99)
POTASSIUM: 3.9 mmol/L (ref 3.5–5.1)
Sodium: 139 mmol/L (ref 135–145)

## 2016-08-22 LAB — GLUCOSE, CAPILLARY
GLUCOSE-CAPILLARY: 341 mg/dL — AB (ref 65–99)
GLUCOSE-CAPILLARY: 51 mg/dL — AB (ref 65–99)
GLUCOSE-CAPILLARY: 185 mg/dL — AB (ref 65–99)
GLUCOSE-CAPILLARY: 315 mg/dL — AB (ref 65–99)
GLUCOSE-CAPILLARY: 270 mg/dL — AB (ref 65–99)
Glucose-Capillary: 72 mg/dL (ref 65–99)
Glucose-Capillary: 242 mg/dL — ABNORMAL HIGH (ref 65–99)

## 2016-08-22 LAB — CBC
HEMATOCRIT: 30 % — AB (ref 35.0–47.0)
HEMOGLOBIN: 10.2 g/dL — AB (ref 12.0–16.0)
MCH: 30.3 pg (ref 26.0–34.0)
MCHC: 34.1 g/dL (ref 32.0–36.0)
MCV: 89 fL (ref 80.0–100.0)
Platelets: 211 10*3/uL (ref 150–440)
RBC: 3.37 MIL/uL — AB (ref 3.80–5.20)
RDW: 14.9 % — ABNORMAL HIGH (ref 11.5–14.5)
WBC: 9.9 10*3/uL (ref 3.6–11.0)

## 2016-08-22 LAB — HEMOGLOBIN A1C: Hgb A1c MFr Bld: 9.1 % — ABNORMAL HIGH (ref 4.0–6.0)

## 2016-08-22 MED ORDER — IPRATROPIUM-ALBUTEROL 0.5-2.5 (3) MG/3ML IN SOLN: 3.0000 mL | Freq: Four times a day (QID) | RESPIRATORY_TRACT | Status: DC | PRN

## 2016-08-22 MED ORDER — PIPERACILLIN-TAZOBACTAM 3.375 G IVPB
3.3750 g | Freq: Three times a day (TID) | INTRAVENOUS | Status: DC
  Administered 2016-08-22 – 2016-08-25 (×9): 3.375 g via INTRAVENOUS
  Filled 2016-08-22 (×9): qty 50

## 2016-08-22 MED ORDER — INSULIN ASPART 100 UNIT/ML ~~LOC~~ SOLN
15.0000 [IU] | Freq: Once | SUBCUTANEOUS | Status: AC
  Administered 2016-08-22: 15 [IU] via SUBCUTANEOUS
  Filled 2016-08-22: qty 15

## 2016-08-22 MED ORDER — INSULIN ASPART PROT & ASPART (70-30 MIX) 100 UNIT/ML ~~LOC~~ SUSP
38.0000 [IU] | Freq: Every day | SUBCUTANEOUS | Status: DC
  Administered 2016-08-23 – 2016-08-25 (×3): 38 [IU] via SUBCUTANEOUS
  Filled 2016-08-22 (×3): qty 38

## 2016-08-22 NOTE — Progress Notes (Signed)
Dr Emmit PomfretHugelmeyer notified of bloodsugar 341, ordered to follow bedtime ss

## 2016-08-22 NOTE — H&P (Addendum)
[facility] - [place] @ [facility] Admission History and Physical [name] [name], D.O.  ---------------------------------------------------------------------------------------------------------------------   PATIENT NAMETerez [name] MR#: [id number] DATE OF BIRTH: 03/29/1934 DATE OF ADMISSION: 08/21/2016 PRIMARY CARE PHYSICIAN: Barbette Reichmann, MD  REQUESTING/REFERRING PHYSICIAN: ED Dr. Derrill Kay  CHIEF COMPLAINT: Chief Complaint  Patient presents with  . Abscess    HISTORY OF PRESENT ILLNESS: [name] [name] is a 82 y.o. female with a known history of Diabetes, hypertension, CVA was in a usual state of health until one week ago when she noticed swelling and redness in her abdomen at this location site of her insulin injections. She states that she has been having increasing pain, redness and swelling and therefore has not administered insulin for the past 2 days secondary to pain. She was concerned because her abdominal wound began draining foul-smelling fluid. She reports decreased appetite but no nausea or vomiting, fevers or chills. He lives at home with her daughter but states that she has a caretaker.  Otherwise there has been no change in status. There has been no recent change in medication or diet.  There has been no recent illness, travel or sick contacts.    Patient denies fevers/chills, weakness, dizziness, chest pain, shortness of breath, N/V/C/D, abdominal pain, dysuria/frequency, changes in mental status.   EMS/ED COURSE:   I&D of the abdominal wound was performed by the emergency department physician and the patient subsequently received 1 g of IV vancomycin. Hospitalist was consulted to continue management of cellulitis as well as hyperglycemia without DKA.  PAST MEDICAL HISTORY: Past Medical History:  Diagnosis Date  . Diabetes mellitus without complication (HCC)   . Hypertension   . Stroke [facility])       PAST SURGICAL HISTORY: History reviewed. No  pertinent surgical history.    SOCIAL HISTORY: Social History  Substance Use Topics  . Smoking status: Former Smoker    Types: Cigarettes  . Smokeless tobacco: Never Used  . Alcohol use No      FAMILY HISTORY: History reviewed. No pertinent family history.   MEDICATIONS AT HOME: Prior to Admission medications   Medication Sig Start Date End Date Taking? Authorizing Provider  atorvastatin (LIPITOR) 10 MG tablet Take 1 tablet by mouth daily. 03/26/16  Yes Historical Provider, MD  gabapentin (NEURONTIN) 100 MG capsule Take 1 capsule by mouth daily. 02/23/16  Yes Historical Provider, MD  KLOR-CON M20 20 MEQ tablet Take 1 tablet by mouth daily. 02/23/16  Yes Historical Provider, MD  NOVOLIN 70/30 (70-30) 100 UNIT/ML injection Inject 45 Units into the skin every morning. 01/28/16  Yes Historical Provider, MD  Vitamin D, Ergocalciferol, (DRISDOL) 50000 units CAPS capsule Take 1 capsule by mouth once a week. 03/26/16  Yes Historical Provider, MD      DRUG ALLERGIES: No Known Allergies   REVIEW OF SYSTEMS: CONSTITUTIONAL: No fever/chills, fatigue, weakness, weight gain/loss, headache EYES: No blurry or double vision. ENT: No tinnitus, postnasal drip, redness or soreness of the oropharynx. RESPIRATORY: No cough, wheeze, hemoptysis, dyspnea. CARDIOVASCULAR: No chest pain, orthopnea, palpitations, syncope. GASTROINTESTINAL: No nausea, vomiting, constipation, diarrhea, abdominal pain, hematemesis, melena or hematochezia. GENITOURINARY: No dysuria or hematuria. ENDOCRINE: No polyuria or nocturia. No heat or cold intolerance. HEMATOLOGY: No anemia, bruising, bleeding. INTEGUMENTARY: No rashes, ulcers, lesions.Positive abdominal wound MUSCULOSKELETAL: No arthritis, swelling, gout. NEUROLOGIC: No numbness, tingling, weakness or ataxia. No seizure-type activity. PSYCHIATRIC: No anxiety, depression, insomnia.  PHYSICAL EXAMINATION: VITAL SIGNS: Blood pressure (!) 151/74, pulse 90, temperature  98.6 F (37 C), temperature source Oral, resp.  rate 16, height 5\' 2"  (1.575 m), weight 72.6 kg (160 lb), SpO2 100 %.  GENERAL: 82 y.o.-year-old black female patient, well-developed, well-nourished lying in the bed in no acute distress.  Pleasant and cooperative.   HEENT: Head atraumatic, normocephalic. Pupils equal, round, reactive to light and accommodation. No scleral icterus. Extraocular muscles intact. Nares are patent. Oropharynx is clear. Mucus membranes moist. NECK: Supple, full range of motion. No JVD, no bruit heard. No thyroid enlargement, no tenderness, no cervical lymphadenopathy. CHEST: Normal breath sounds bilaterally. No wheezing, rales, rhonchi or crackles. No use of accessory muscles of respiration.  No reproducible chest wall tenderness.  CARDIOVASCULAR: S1, S2 normal. No murmurs, rubs, or gallops. Cap refill <2 seconds. ABDOMEN: Soft, nontender, nondistended. No rebound, guarding, rigidity. Normoactive bowel sounds present in all four quadrants. No organomegaly or mass. EXTREMITIES: Full range of motion. No pedal edema, cyanosis, or clubbing. NEUROLOGIC: Cranial nerves II through XII are grossly intact with no focal sensorimotor deficit. Muscle strength 5/5 in all extremities. Sensation intact. Gait not checked. PSYCHIATRIC: The patient is alert and oriented x 3. Normal affect, mood, thought content. SKIN: Warm, dry, and intact without obvious rash, lesion, or ulcer with the exception of packed and dressed wound in the central abdomen just inferior to the umbilicus status post I&D.  LABORATORY PANEL:  CBC  Recent Labs Lab 08/21/16 1818  WBC 12.6*  HGB 12.4  HCT 37.6  PLT 231   ----------------------------------------------------------------------------------------------------------------- Chemistries  Recent Labs Lab 08/21/16 1818 08/21/16 2255  NA 133*  --   K 4.8  --   CL 96*  --   CO2 27  --   GLUCOSE 525*  --   BUN 25*  --   CREATININE 1.24*  --    CALCIUM 8.5*  --   MG  --  2.2   ------------------------------------------------------------------------------------------------------------------ Cardiac Enzymes No results for input(s): TROPONINI in the last 168 hours. ------------------------------------------------------------------------------------------------------------------  RADIOLOGY: Dg Chest Portable 1 View  Result Date: 08/21/2016 CLINICAL DATA:  Hypoxia, history hypertension, stroke, diabetes mellitus EXAM: PORTABLE CHEST 1 VIEW COMPARISON:  Portable exam 2054 hours compared 02/04/2014 FINDINGS: Normal heart size, mediastinal contours, and pulmonary vascularity. Atherosclerotic calcification aorta. Bronchitic changes with bibasilar scarring. No acute infiltrate, pleural effusion or pneumothorax. Bones demineralized. Prior cervical spine fusion. IMPRESSION: Bronchitic changes with bibasilar scarring. No acute abnormalities. Aortic atherosclerosis. Electronically Signed   By: Ulyses Southward M.D.   On: 08/21/2016 21:15    EKG: Pending  IMPRESSION AND PLAN:  This is a 82 y.o. female with a history of diabetes, hypertension, CVA now being admitted with: 1. Cellulitis and abscess of the abdominal wall secondary to insulin administration status post I&D. Patient will be admitted for IV antibiotics, IV fluids, tight glucose control and follow up wound cultures. 2. Hyperglycemia secondary to medication noncompliance-Will continue home medications and cover with regular insulin sliding scale coverage. 3. AKI -IV normal saline and recheck BMP in a.m. 4. Hypoxia with evidence of bronchitis -continue O2, DuoNeb's and repeat chest x-ray in a.m.  Consider steroids if hypoxia persists.  Patient has no fevers, cough or symptoms suggestive of pneumonia and therefore does not warrant antibiotics at this time.  Otherwise continue home medications We'll place a consult to social work for home health services.  Diet/Nutrition: Carb controlled,  heart healthy Fluids: IV normal saline DVT Px: Lovenox, SCDs and early ambulation Code Status: Full  All the records are reviewed and case discussed with ED provider. Management plans discussed with the patient and/or  family who express understanding and agree with plan of care.   TOTAL TIME TAKING CARE OF THIS PATIENT: 60 minutes.   [name] [name] D.O. on 08/22/2016 at 1:51 AM Between 7am to 6pm - Pager - [phone] After 6pm go to [email or web address] - Social research officer, governmentpassword EPAS [facility] [place] Hospitalists Office [phone] CC: Primary care physician; Barbette ReichmannHANDE,VISHWANATH, MD     Note: This dictation was prepared with Dragon dictation along with smaller phrase technology. Any transcriptional errors that result from this process are unintentional.

## 2016-08-22 NOTE — Care Management Important Message (Signed)
Important Message  Patient Details  Name: [name] [name] MRN: [id number] Date of Birth: 1934/09/11   Medicare Important Message Given:  Yes    [name] A, RN 08/22/2016, 7:52 AM

## 2016-08-22 NOTE — Progress Notes (Signed)
Dr Emmit PomfretHugelmeyer notified of increased blood sugar, orders given, also instructed no lab draw

## 2016-08-22 NOTE — Progress Notes (Signed)
Pharmacy Antibiotic Note  [name] [name] is a 82 y.o. female admitted on 08/21/2016 with cellulitis with abscess.  Pharmacy has been consulted for vancomycin and Zosyn dosing.  Plan: Zosyn 3.375 g EI q 8 hours.   Vancomycin 1 gm IV x 1 in ED followed in approximately 16 hours (stacked dosing) by vancomycin 750 mg IV Q24, predicted trough 16 mcg/mL. Pharmacy will continue to follow and adjust as needed to maintain trough 15 to 20 mcg/mL.   Vd 41.2 L, Ke 0.032 hr-1, T1/2 21.5 hr  Height: 5\' 2"  (157.5 cm) Weight: 160 lb (72.6 kg) IBW/kg (Calculated) : 50.1  Temp (24hrs), Avg:98.3 F (36.8 C), Min:98.1 F (36.7 C), Max:98.6 F (37 C)   Recent Labs Lab 08/21/16 1818 08/22/16 0634  WBC 12.6* 9.9  CREATININE 1.24* 1.08*    Estimated Creatinine Clearance (by C-G formula based on SCr of 1.08 mg/dL) Female: 40.938.8 mL/min Female: 47.7 mL/min    No Known Allergies  Thank you for allowing pharmacy to be a part of this patient's care.  Luisa Harthristy, Erbie Arment D, Pharm.D., BCPS Clinical Pharmacist 08/22/2016 11:52 AM

## 2016-08-22 NOTE — Progress Notes (Signed)
Inpatient Diabetes Program Recommendations  AACE/ADA: New Consensus Statement on Inpatient Glycemic Control (2015)  Target Ranges:  Prepandial:   less than 140 mg/dL      Peak postprandial:   less than 180 mg/dL (1-2 hours)      Critically ill patients:  140 - 180 mg/dL   Results for [name] [name], [name] [name] (MRN [id number]) as of 08/22/2016 08:25  Ref. Range 08/21/2016 22:29 08/21/2016 23:46 08/22/2016 01:27 08/22/2016 07:44 08/22/2016 08:00  Glucose-Capillary Latest Ref Range: 65 - 99 mg/dL 119527 (HH) 147446 (H) 829341 (H) 51 (L) 72    Review of Glycemic Control  Diabetes history: DM2 Outpatient Diabetes medications: 70/30 45 units q am Current orders for Inpatient glycemic control: 70/30 45 units q am + Novolog correction 0-15 units tid + 0-5 units hs  Inpatient Diabetes Program Recommendations:  Noted patient had hypoglycemia post administration of Novolog to decrease hyperglycemia. Spoke with nurse Clydie BraunKaren regarding patient admitted with abdominal wound @ site of insulin injections. Nurse has been reviewing with patient need to rotate injection sites and plans to review proper selection of sites along with need for medication compliance. Attempted to call by phone to speak with patient but no answer. Will follow.  Thank you, Billy FischerJudy E. Jadin Kagel, RN, MSN, CDE Inpatient Glycemic Control Team Team Pager [phone] (8am-5pm) 08/22/2016 8:41 AM

## 2016-08-22 NOTE — Care Management Note (Signed)
Case Management Note  Patient Details  Name: Frederich Chickrnell Gorby MRN: [id number] Date of Birth: [birth year]  Subjective/Objective:       Attempted to discuss discharge planning with Mrs Rebecca EatonMcTillman but she was quite pleasantly confused and was a poor historian today. PCP=DR Hande. She is unable to remember the name of her pharmacy but states [place]. Ms Rebecca EatonMcTillman reports that she has a home health aide who helps with cooking and housework but she cannot remember what agency that the aide comes from. She reports that the Aide pulls up her insulin into syringes for her but that she self administers her own insulin. She reports that she resides with her [facility] and breaking her leg in 2 places. [place]. Ms Rebecca EatonMcTillman states that she has a RW ,and no home oxygen.Case management will follow for discharge planning and will try to contact Ms Bradd BurnerMcTillmans daughter to obtain more information.                Action/Plan:   Expected Discharge Date:                  Expected Discharge Plan:     In-House Referral:     Discharge planning Services     Post Acute Care Choice:    Choice offered to:     DME Arranged:    DME Agency:     HH Arranged:    HH Agency:     Status of Service:     If discussed at [employer] Length of Stay Meetings, dates discussed:    Additional Comments:  Shanaia Sievers A, RN 08/22/2016, 2:41 PM

## 2016-08-22 NOTE — Progress Notes (Signed)
[facility] - [place] at [facility]   PATIENT NAME: [name] [name]    MR#:  [id number]  DATE OF BIRTH:  March 08, 1934  SUBJECTIVE:  CHIEF COMPLAINT:   Chief Complaint  Patient presents with  . Abscess   Feels well. Mild pain around the abdominal wound. She was taking her insulin shots in the same site every day.  Do not take insulin for 2 days since noticing her wound.  REVIEW OF SYSTEMS:    Review of Systems  Constitutional: Positive for malaise/fatigue. Negative for chills and fever.  HENT: Negative for sore throat.   Eyes: Negative for blurred vision, double vision and pain.  Respiratory: Negative for cough, hemoptysis, shortness of breath and wheezing.   Cardiovascular: Negative for chest pain, palpitations, orthopnea and leg swelling.  Gastrointestinal: Negative for abdominal pain, constipation, diarrhea, heartburn, nausea and vomiting.  Genitourinary: Negative for dysuria and hematuria.  Musculoskeletal: Negative for back pain and joint pain.  Skin: Negative for rash.  Neurological: Negative for sensory change, speech change, focal weakness and headaches.  Endo/Heme/Allergies: Does not bruise/bleed easily.  Psychiatric/Behavioral: Negative for depression. The patient is not nervous/anxious.     DRUG ALLERGIES:  No Known Allergies  VITALS:  Blood pressure (!) 107/47, pulse 77, temperature 98.2 F (36.8 C), temperature source Oral, resp. rate 20, height 5\' 2"  (1.575 m), weight 72.6 kg (160 lb), SpO2 99 %.  PHYSICAL EXAMINATION:   Physical Exam  GENERAL:  82 y.o.-year-old patient lying in the bed with no acute distress. obese EYES: Pupils equal, round, reactive to light and accommodation. No scleral icterus. Extraocular muscles intact.  HEENT: Head atraumatic, normocephalic. Oropharynx and nasopharynx clear.  NECK:  Supple, no jugular venous distention. No thyroid enlargement, no tenderness.  LUNGS: Normal breath sounds bilaterally, no  wheezing, rales, rhonchi. No use of accessory muscles of respiration.  CARDIOVASCULAR: S1, S2 normal. No murmurs, rubs, or gallops.  ABDOMEN: Soft, nondistended. Bowel sounds present. No organomegaly or mass. Open Wound 2 x 2 cm with serosanguineous foul-smelling discharge. Rounding erythema EXTREMITIES: No cyanosis, clubbing or edema b/l.    NEUROLOGIC: Cranial nerves II through XII are intact. No focal Motor or sensory deficits b/l.   PSYCHIATRIC: The patient is alert and oriented x 3.  SKIN: No obvious rash, lesion, or ulcer.   LABORATORY PANEL:   CBC  Recent Labs Lab 08/22/16 0634  WBC 9.9  HGB 10.2*  HCT 30.0*  PLT 211   ------------------------------------------------------------------------------------------------------------------ Chemistries   Recent Labs Lab 08/21/16 2255 08/22/16 0634  NA  --  139  K  --  3.9  CL  --  107  CO2  --  30  GLUCOSE  --  54*  BUN  --  27*  CREATININE  --  1.08*  CALCIUM  --  8.1*  MG 2.2  --    ------------------------------------------------------------------------------------------------------------------  Cardiac Enzymes No results for input(s): TROPONINI in the last 168 hours. ------------------------------------------------------------------------------------------------------------------  RADIOLOGY:  Dg Chest 1 View  Result Date: 08/22/2016 CLINICAL DATA:  Hypoxic EXAM: CHEST 1 VIEW COMPARISON:  08/21/2016 FINDINGS: Mild patchy bibasilar opacities, likely atelectasis, right lower lobe pneumonia not excluded. Possible small bilateral pleural effusions.  No pneumothorax. The heart is normal in size. IMPRESSION: Mild patchy bibasilar opacities, likely atelectasis, right lower lobe pneumonia not excluded. Possible small bilateral pleural effusions. Electronically Signed   By: Charline Bills M.D.   On: 08/22/2016 10:23   Dg Chest Portable 1 View  Result Date: 08/21/2016 CLINICAL DATA:  Hypoxia,  history hypertension, stroke,  diabetes mellitus EXAM: PORTABLE CHEST 1 VIEW COMPARISON:  Portable exam 2054 hours compared 02/04/2014 FINDINGS: Normal heart size, mediastinal contours, and pulmonary vascularity. Atherosclerotic calcification aorta. Bronchitic changes with bibasilar scarring. No acute infiltrate, pleural effusion or pneumothorax. Bones demineralized. Prior cervical spine fusion. IMPRESSION: Bronchitic changes with bibasilar scarring. No acute abnormalities. Aortic atherosclerosis. Electronically Signed   By: Ulyses SouthwardMark  Boles M.D.   On: 08/21/2016 21:15     ASSESSMENT AND PLAN:   * Abdominal abscess at insulin injection site S/p InD Sent wound cx swab today. Dressing changes daily On IV vancomycin. Foul smelling discharge. Will add zosyn for GNR and anerobes.  *  IDDM Uncontrolled on admission due to missing insulin doses for 2 days Hypoglycemia earlier likely due to increased SSI coverage. Reduce 70/30 dose. Continue SSI Diabetic diet  * Hypertension Continue home medications  * DVT prophylaxis with Lovenox  All the records are reviewed and case discussed with Care Management/Social Workerr. Management plans discussed with the patient, family and they are in agreement.  CODE STATUS: FULL CODE  DVT Prophylaxis: SCDs  TOTAL TIME TAKING CARE OF THIS PATIENT: 30 minutes.   POSSIBLE D/C IN 1-2 DAYS, DEPENDING ON CLINICAL CONDITION.  Milagros LollSudini, Verl Kitson R M.D on 08/22/2016 at 11:46 AM  Between 7am to 6pm - Pager - [phone]  After 6pm go to [email or web address] - password [email or web address]  Fabio Neighbors[facility] [place] [facility]  Office  [phone]  CC: Primary care physician; Barbette ReichmannHANDE,VISHWANATH, MD  Note: This dictation was prepared with Dragon dictation along with smaller phrase technology. Any transcriptional errors that result from this process are unintentional.

## 2016-08-23 DIAGNOSIS — L0291 Cutaneous abscess, unspecified: Secondary | ICD-10-CM

## 2016-08-23 LAB — GLUCOSE, CAPILLARY
GLUCOSE-CAPILLARY: 287 mg/dL — AB (ref 65–99)
GLUCOSE-CAPILLARY: 164 mg/dL — AB (ref 65–99)
Glucose-Capillary: 250 mg/dL — ABNORMAL HIGH (ref 65–99)
Glucose-Capillary: 136 mg/dL — ABNORMAL HIGH (ref 65–99)

## 2016-08-23 MED ORDER — DAKINS (1/4 STRENGTH) 0.125 % EX SOLN
1300.0000 "application " | Freq: Two times a day (BID) | CUTANEOUS | Status: DC
  Administered 2016-08-23 – 2016-08-25 (×4): 1300
  Filled 2016-08-23 (×2): qty 13244

## 2016-08-23 NOTE — NC FL2 (Signed)
[place] MEDICAID FL2 LEVEL OF CARE SCREENING TOOL     IDENTIFICATION  Patient Name: [name] [name] Birthdate: February 16, 1934 Sex: female Admission Date (Current Location): 08/21/2016  [place]   Facility and Address:  [facility], [address], [place], [place] [address]      Provider Number: [id number]  Attending Physician Name and Address:  Altamese DillingVaibhavkumar Vachhani, MD  Relative Name and Phone Number:       Current Level of Care: Hospital Recommended Level of Care: Skilled Nursing Facility Prior Approval Number:    Date Approved/Denied:   PASRR Number:  [id number])  Discharge Plan: SNF    Current Diagnoses: Patient Active Problem List   Diagnosis Date Noted  . Abscess 08/21/2016    Orientation RESPIRATION BLADDER Height & Weight     Self, Time, Situation  Normal Incontinent Weight: 160 lb (72.6 kg) Height:  5\' 2"  (157.5 cm)  BEHAVIORAL SYMPTOMS/MOOD NEUROLOGICAL BOWEL NUTRITION STATUS   (none)  (none) Continent Diet (Diet: Heart Healthy/ Carb Modified )  AMBULATORY STATUS COMMUNICATION OF NEEDS Skin   Extensive Assist Verbally Surgical wounds (I&D on Abdomen. )                       Personal Care Assistance Level of Assistance  Bathing, Feeding, Dressing Bathing Assistance: Limited assistance Feeding assistance: Independent Dressing Assistance: Limited assistance     Functional Limitations Info  Sight, Hearing, Speech Sight Info: Adequate Hearing Info: Adequate Speech Info: Adequate    SPECIAL CARE FACTORS FREQUENCY  PT (By licensed PT), OT (By licensed OT)     PT Frequency:  (5) OT Frequency:  (5)            Contractures      Additional Factors Info  Code Status, Allergies, Insulin Sliding Scale Code Status Info:  (Full Code. ) Allergies Info:  (No Known Allergies. )   Insulin Sliding Scale Info:  (NovoLog Insulin )       Current Medications (08/23/2016):  This is the current  hospital active medication list Current Facility-Administered Medications  Medication Dose Route Frequency Provider Last Rate Last Dose  . acetaminophen (TYLENOL) tablet 650 mg  650 mg Oral Q6H PRN Alexis Hugelmeyer, DO       Or  . acetaminophen (TYLENOL) suppository 650 mg  650 mg Rectal Q6H PRN Alexis Hugelmeyer, DO      . atorvastatin (LIPITOR) tablet 10 mg  10 mg Oral Daily Alexis Hugelmeyer, DO   10 mg at 08/23/16 1149  . bisacodyl (DULCOLAX) EC tablet 5 mg  5 mg Oral Daily PRN Alexis Hugelmeyer, DO      . enoxaparin (LOVENOX) injection 40 mg  40 mg Subcutaneous QHS Alexis Hugelmeyer, DO   40 mg at 08/22/16 2203  . gabapentin (NEURONTIN) capsule 100 mg  100 mg Oral Daily Alexis Hugelmeyer, DO   100 mg at 08/23/16 1149  . HYDROcodone-acetaminophen (NORCO/VICODIN) 5-325 MG per tablet 1-2 tablet  1-2 tablet Oral Q4H PRN Alexis Hugelmeyer, DO      . insulin aspart (novoLOG) injection 0-15 Units  0-15 Units Subcutaneous TID WC Alexis Hugelmeyer, DO   8 Units at 08/23/16 1147  . insulin aspart (novoLOG) injection 0-5 Units  0-5 Units Subcutaneous QHS Alexis Hugelmeyer, DO   3 Units at 08/22/16 2204  . insulin aspart protamine- aspart (NOVOLOG MIX 70/30) injection 38 Units  38 Units Subcutaneous Q breakfast Milagros LollSrikar Sudini, MD   38 Units at 08/23/16 [id number]  .  ipratropium-albuterol (DUONEB) 0.5-2.5 (3) MG/3ML nebulizer solution 3 mL  3 mL Nebulization Q6H PRN Alexis Hugelmeyer, DO      . lidocaine-EPINEPHrine (XYLOCAINE-EPINEPHrine) 1 %-1:200000 (PF) injection 30 mL  30 mL Intradermal Once Phineas Semen, MD      . magnesium citrate solution 1 Bottle  1 Bottle Oral Once PRN Alexis Hugelmeyer, DO      . ondansetron (ZOFRAN) tablet 4 mg  4 mg Oral Q6H PRN Alexis Hugelmeyer, DO       Or  . ondansetron (ZOFRAN) injection 4 mg  4 mg Intravenous Q6H PRN Alexis Hugelmeyer, DO      . piperacillin-tazobactam (ZOSYN) IVPB 3.375 g  3.375 g Intravenous Q8H Srikar Sudini, MD   3.375 g at 08/23/16 1345  . potassium  chloride SA (K-DUR,KLOR-CON) CR tablet 20 mEq  20 mEq Oral Daily Alexis Hugelmeyer, DO   20 mEq at 08/23/16 1148  . senna-docusate (Senokot-S) tablet 1 tablet  1 tablet Oral QHS PRN Alexis Hugelmeyer, DO      . sodium hypochlorite (DAKIN'S 1/4 STRENGTH) topical solution 1,300 application  1,300 application Irrigation BID Ricarda Frame, MD      . vancomycin (VANCOCIN) IVPB 750 mg/150 ml premix  750 mg Intravenous Q24H Alexis Hugelmeyer, DO   750 mg at 08/23/16 1151  . Vitamin D (Ergocalciferol) (DRISDOL) capsule 50,000 Units  50,000 Units Oral Weekly Alexis Hugelmeyer, DO   50,000 Units at 08/22/16 1125  . zolpidem (AMBIEN) tablet 5 mg  5 mg Oral QHS PRN Alexis Hugelmeyer, DO         Discharge Medications: Please see discharge summary for a list of discharge medications.  Relevant Imaging Results:  Relevant Lab Results:   Additional Information  (SSN: [id number])  Dontavion Noxon, Darleen Crocker, LCSW

## 2016-08-23 NOTE — Evaluation (Signed)
Physical Therapy Evaluation Patient Details Name: [name] [name] Matters MRN: [id number] DOB: [birth year] Today's Date: 08/23/2016   History of Present Illness  Pt is an 82 y.o. female presenting to hospital with redness and swelling to stomache.  Pt admitted with cellulitis and abscess of abdominal wall secondary to insulin administration; s/p I&D 08/21/16.  PMH includes DM, htn, stroke, c-spine fusion.  Clinical Impression  Prior to admission, pt was most recently using w/c in home d/t unable to ambulate d/t weakness for past few weeks.  Pt lives alone currently and reports struggling at home (pt reports home health aide only comes about 1x/month now).  Currently pt is mod assist supine to sit and required 2 assist to stand and take some steps bed to chair with RW.  Pt would benefit from skilled PT to address noted impairments and functional limitations.  Pt does not appear safe to discharge home alone d/t current assist levels required for functional mobility.  Recommend pt discharge to STR when medically appropriate.    Follow Up Recommendations SNF    Equipment Recommendations   (TBD)    Recommendations for Other Services       Precautions / Restrictions Precautions Precautions: Fall Precaution Comments: Abdominal incision Restrictions Weight Bearing Restrictions: No      Mobility  Bed Mobility Overal bed mobility: Needs Assistance Bed Mobility: Supine to Sit     Supine to sit: HOB elevated;Mod assist     General bed mobility comments: vc's for logrolling technique required; assist for trunk and B LE's; extra time to perform  Transfers Overall transfer level: Needs assistance Equipment used: Rolling walker (2 wheeled) Transfers: Sit to/from Stand Sit to Stand: Min assist;Mod assist;+2 physical assistance         General transfer comment: assist to initiate stand and come to full upright; vc's for hand and feet placement required  Ambulation/Gait Ambulation/Gait  assistance: Min assist;Mod assist;Max assist;+2 physical assistance Ambulation Distance (Feet): 3 Feet Assistive device: Rolling walker (2 wheeled)   Gait velocity: decreased   General Gait Details: pt initially min to mod assist x2 with shuffling gait but pt quickly fatigued and required mod to max assist x2 to make it to chair safely  Stairs            Wheelchair Mobility    Modified Rankin (Stroke Patients Only)       Balance Overall balance assessment: Needs assistance Sitting-balance support: Bilateral upper extremity supported;Feet supported Sitting balance-Leahy Scale: Fair     Standing balance support: Bilateral upper extremity supported (on RW) Standing balance-Leahy Scale: Fair Standing balance comment: static standing                             Pertinent Vitals/Pain Pain Assessment: 0-10 Pain Score: 3  Pain Location: Abdomen Pain Descriptors / Indicators: Sore Pain Intervention(s): Limited activity within patient's tolerance;Monitored during session;Repositioned  Pt's O2 desaturated to 89% on room air with activity but recovered to 93% with rest (nursing notified).  HR WFL during session.    Home Living Family/patient expects to be discharged to:: Private residence Living Arrangements: Alone (Pt's daughter typically lives with pt but currently not staying in the home) Available Help at Discharge: Family Type of Home: House Home Access: Stairs to enter Entrance Stairs-Rails: Right Entrance Stairs-Number of Steps: 6 Home Layout: One level Home Equipment: Wheelchair - [employer] Corporationmanual;Walker - 2 wheels      Prior Function Level of Independence: Independent with assistive  device(s)         Comments: Pt reports using w/c last few weeks d/t too weak to walk.  Pt reports having a home health aide but she only comes 1x/month to assist with groceries etc and pt reports having difficulty managing at home now and family has had to help.     Hand  Dominance        Extremity/Trunk Assessment   Upper Extremity Assessment: RUE deficits/detail;LUE deficits/detail RUE Deficits / Details: R shoulder flexion 4+/5; R elbow flexion/extension 4+/5; good grip strength     LUE Deficits / Details: L shoulder flexion 4/5; L elbow flexion/extension 4/5; decreased L grip strength compared to R (residual from prior stroke)   Lower Extremity Assessment: RLE deficits/detail;LLE deficits/detail RLE Deficits / Details: R hip flexion, knee flexion/extension and DF at least 4/5 LLE Deficits / Details: L hip flexion 3+/5; L knee flexion/extension 4/5; L DF 4-/5     Communication   Communication: No difficulties  Cognition Arousal/Alertness: Awake/alert Behavior During Therapy: WFL for tasks assessed/performed Overall Cognitive Status:  (Oriented to person, Place (with multiple choice); requires cueing for date and situation)                      General Comments   Nursing cleared pt for participation in physical therapy.  Pt agreeable to PT session.    Exercises General Exercises - Lower Extremity Ankle Circles/Pumps: AROM;Strengthening;Both;10 reps;Supine Short Arc Quad: AROM;Strengthening;Both;10 reps;Supine Heel Slides: AROM;AAROM;Strengthening;Both;10 reps;Supine Hip ABduction/ADduction: AROM;AAROM;Strengthening;Both;10 reps;Supine      Assessment/Plan    PT Assessment Patient needs continued PT services  PT Diagnosis Difficulty walking;Generalized weakness   PT Problem List Decreased strength;Decreased activity tolerance;Decreased balance;Decreased mobility  PT Treatment Interventions DME instruction;Gait training;Stair training;Functional mobility training;Therapeutic activities;Therapeutic exercise;Balance training;Patient/family education   PT Goals (Current goals can be found in the Care Plan section) Acute Rehab PT Goals Patient Stated Goal: to be able to walk again PT Goal Formulation: With patient Time For Goal  Achievement: 09/06/16 Potential to Achieve Goals: Fair    Frequency Min 2X/week   Barriers to discharge Decreased caregiver support      Co-evaluation               End of Session Equipment Utilized During Treatment: Gait belt Activity Tolerance: Patient limited by fatigue Patient left: in chair;with call bell/phone within reach;with chair alarm set;with family/visitor present Nurse Communication: Mobility status;Precautions (assist levels)         Time: 9604-5409 PT Time Calculation (min) (ACUTE ONLY): 38 min   Charges:   PT Evaluation $PT Eval Low Complexity: 1 Procedure PT Treatments $Therapeutic Exercise: 8-22 mins $Therapeutic Activity: 8-22 mins   PT G CodesHendricks Limes Sep 07, 2016, 5:03 PM Hendricks Limes, PT [phone]

## 2016-08-23 NOTE — Progress Notes (Signed)
[facility] - [place] at [facility]   PATIENT NAME: [name] [name]    MR#:  [id number]  DATE OF BIRTH:  Jul 22, 1934  SUBJECTIVE:  CHIEF COMPLAINT:   Chief Complaint  Patient presents with  . Abscess   Feels well. Mild pain around the abdominal wound. She was taking her insulin shots in the same site every day.  did not take insulin for 2 days since noticing her wound. I& D was done in ER, and pt feels the wound is getting better, as per nurse also less induration today.  REVIEW OF SYSTEMS:    Review of Systems  Constitutional: Positive for malaise/fatigue. Negative for chills and fever.  HENT: Negative for sore throat.   Eyes: Negative for blurred vision, double vision and pain.  Respiratory: Negative for cough, hemoptysis, shortness of breath and wheezing.   Cardiovascular: Negative for chest pain, palpitations, orthopnea and leg swelling.  Gastrointestinal: Negative for abdominal pain, constipation, diarrhea, heartburn, nausea and vomiting.  Genitourinary: Negative for dysuria and hematuria.  Musculoskeletal: Negative for back pain and joint pain.  Skin: Negative for rash.  Neurological: Negative for sensory change, speech change, focal weakness and headaches.  Endo/Heme/Allergies: Does not bruise/bleed easily.  Psychiatric/Behavioral: Negative for depression. The patient is not nervous/anxious.     DRUG ALLERGIES:  No Known Allergies  VITALS:  Blood pressure (!) 137/57, pulse 79, temperature 98.6 F (37 C), temperature source Oral, resp. rate 20, height 5\' 2"  (1.575 m), weight 72.6 kg (160 lb), SpO2 96 %.  PHYSICAL EXAMINATION:   Physical Exam  GENERAL:  82 y.o.-year-old patient lying in the bed with no acute distress. obese EYES: Pupils equal, round, reactive to light and accommodation. No scleral icterus. Extraocular muscles intact.  HEENT: Head atraumatic, normocephalic. Oropharynx and nasopharynx clear.  NECK:  Supple, no jugular  venous distention. No thyroid enlargement, no tenderness.  LUNGS: Normal breath sounds bilaterally, no wheezing, rales, rhonchi. No use of accessory muscles of respiration.  CARDIOVASCULAR: S1, S2 normal. No murmurs, rubs, or gallops.  ABDOMEN: Soft, nondistended. Bowel sounds present. No organomegaly or mass. Open Wound on lower abdominal wall 2 x 2 cm with serosanguineous foul-smelling discharge. Rounding erythema for 3-4 cm surrounding the wound all the sides. EXTREMITIES: No cyanosis, clubbing or edema b/l.    NEUROLOGIC: Cranial nerves II through XII are intact. No focal Motor or sensory deficits b/l.   PSYCHIATRIC: The patient is alert and oriented x 3.  SKIN: No obvious rash, lesion, or ulcer.   LABORATORY PANEL:   CBC  Recent Labs Lab 08/22/16 0634  WBC 9.9  HGB 10.2*  HCT 30.0*  PLT 211   ------------------------------------------------------------------------------------------------------------------ Chemistries   Recent Labs Lab 08/21/16 2255 08/22/16 0634  NA  --  139  K  --  3.9  CL  --  107  CO2  --  30  GLUCOSE  --  54*  BUN  --  27*  CREATININE  --  1.08*  CALCIUM  --  8.1*  MG 2.2  --    ------------------------------------------------------------------------------------------------------------------  Cardiac Enzymes No results for input(s): TROPONINI in the last 168 hours. ------------------------------------------------------------------------------------------------------------------  RADIOLOGY:  Dg Chest 1 View  Result Date: 08/22/2016 CLINICAL DATA:  Hypoxic EXAM: CHEST 1 VIEW COMPARISON:  08/21/2016 FINDINGS: Mild patchy bibasilar opacities, likely atelectasis, right lower lobe pneumonia not excluded. Possible small bilateral pleural effusions.  No pneumothorax. The heart is normal in size. IMPRESSION: Mild patchy bibasilar opacities, likely atelectasis, right lower lobe pneumonia not excluded.  Possible small bilateral pleural effusions.  Electronically Signed   By: Charline BillsSriyesh  Krishnan M.D.   On: 08/22/2016 10:23   Dg Chest Portable 1 View  Result Date: 08/21/2016 CLINICAL DATA:  Hypoxia, history hypertension, stroke, diabetes mellitus EXAM: PORTABLE CHEST 1 VIEW COMPARISON:  Portable exam 2054 hours compared 02/04/2014 FINDINGS: Normal heart size, mediastinal contours, and pulmonary vascularity. Atherosclerotic calcification aorta. Bronchitic changes with bibasilar scarring. No acute infiltrate, pleural effusion or pneumothorax. Bones demineralized. Prior cervical spine fusion. IMPRESSION: Bronchitic changes with bibasilar scarring. No acute abnormalities. Aortic atherosclerosis. Electronically Signed   By: Ulyses SouthwardMark  Boles M.D.   On: 08/21/2016 21:15     ASSESSMENT AND PLAN:   * Abdominal abscess at insulin injection site S/p I &D Sent wound cx swab . Dressing changes twice daily On IV vancomycin and Zosyn. Foul smelling discharge. Appreciated help by surgery today.  *  IDDM Uncontrolled on admission due to missing insulin doses for 2 days Hypoglycemia earlier likely due to increased SSI coverage. Reduce 70/30 dose. Continue SSI Diabetic diet  * Hypertension Continue home medications- stable.  * DVT prophylaxis with Lovenox  * Generalized weakness   Will get PT eval.  All the records are reviewed and case discussed with Care Management/Social Workerr. Management plans discussed with the patient, family and they are in agreement.  CODE STATUS: FULL CODE  DVT Prophylaxis: SCDs  TOTAL TIME TAKING CARE OF THIS PATIENT: 30 minutes.   POSSIBLE D/C IN 1-2 DAYS, DEPENDING ON CLINICAL CONDITION.  Altamese DillingVACHHANI, Hubbert Landrigan M.D on 08/23/2016 at 1:18 PM  Between 7am to 6pm - Pager - [phone]  After 6pm go to [email or web address] - password [email or web address]  Fabio Neighbors[facility] [place] [facility]  Office  [phone]  CC: Primary care physician; Barbette ReichmannHANDE,VISHWANATH, MD  Note: This dictation was prepared with Dragon dictation along with  smaller phrase technology. Any transcriptional errors that result from this process are unintentional.

## 2016-08-23 NOTE — Progress Notes (Signed)
Inpatient Diabetes Program Recommendations  AACE/ADA: New Consensus Statement on Inpatient Glycemic Control (2015)  Target Ranges:  Prepandial:   less than 140 mg/dL      Peak postprandial:   less than 180 mg/dL (1-2 hours)      Critically ill patients:  140 - 180 mg/dL  Results for [name] [name], [name] [name] (MRN [id number]) as of 08/23/2016 12:44  Ref. Range 08/21/2016 22:29 08/21/2016 23:46 08/22/2016 01:27 08/22/2016 07:44 08/22/2016 08:00 08/22/2016 09:56 08/22/2016 11:39 08/22/2016 16:29 08/22/2016 21:55 08/23/2016 07:43 08/23/2016 11:36  Glucose-Capillary Latest Ref Range: 65 - 99 mg/dL 784 (HH) 696 (H) 295 (H) 51 (L) 72 185 (H) 242 (H) 315 (H) 270 (H) 250 (H) 287 (H)   Results for [name] [name], [name] [name] (MRN [id number]) as of 08/23/2016 12:44  Ref. Range 08/22/2016 06:34  Hemoglobin A1C Latest Ref Range: 4.0 - 6.0 % 9.1 (H)    Review of Glycemic Control  Diabetes history: DM2 Outpatient Diabetes medications: 70/30 28 units QAM (before breakfast) Current orders for Inpatient glycemic control: 70/30 38 units with breakfast, Novolog 0-15 units TID with meals, Novolog 0-5 units QHS  Inpatient Diabetes Program Recommendations: HgbA1C: A1C 9.1% on 08/22/16 indicating an average glucose of 214 mg/dl over the past 2-3 months.   NOTE: Spoke with patient about diabetes and home regimen for diabetes control. Patient reports that she is followed by PCP for diabetes management and currently she takes insulin 28 units before breakfast daily as an outpatient for diabetes control. Inquired about the name of the insulin she is taking and patient states "I am not sure what the name of it is. I have been on it for years and it is the same one I have been taking for years."  Informed patient that 70/30 is the insulin listed on her home medication list and patient stated "I guess that is the one I am taking then."  Patient reports that she is taking insulin as prescribed and that no changes were made with her insulin at her last office visit. In  talking with the patient she states that she has been injecting the insulin in her abdomen in the same general area. She now has an abscess where she has been injecting insulin and patient reports that it is tender and had pus draining from wound. Examined abdomen but wound is covered with dressing so not able to visualize. Explained to the patient that she needs to be rotating insulin injection sites. Discussed other locations she could use for insulin injections (back of arms, front or side of thigh, side of hips, etc..).  Patient states that she was not aware that she could use other locations for insulin injections. Patient states that she checks her glucose once a day and that it is usually in the mid 100's mg/dl in the morning.  Inquired about prior A1C and patient reports that she does not know what an  A1C is. Explained what an A1C is. Discussed A1C results (9.1% on 08/22/16) and explained that her current A1C indicates an average glucose of 214 mg/dl over the past 2-3 months. Discussed glucose and A1C goals. Discussed importance of checking CBGs and maintaining good CBG control to prevent long-term and short-term complications. Explained how hyperglycemia leads to damage within blood vessels which lead to the common complications seen with uncontrolled diabetes. Stressed to the patient the importance of improving glycemic control to prevent further complications from uncontrolled diabetes. Discussed impact of nutrition, exercise, stress, sickness, and medications on diabetes control.  Encouraged patient to check  her glucose 2-3 times per day (before meals and at bedtime) and to keep a log book of glucose readings and insulin taken which she will need to take to doctor appointments. Explained how the doctor he follows up with can use the log book to continue to make insulin adjustments if needed. Provided patient with log book to document on at home. Patient verbalized understanding of information discussed  and she states that she has no further questions at this time related to diabetes.  Note that patient is using 70/30 once a day and is currently ordered 70/30 38 units with breakfast as an inpatient. Will follow glucose trends but anticipate that patient will need to take 70/30 BID as once a day 70/30 will not last for 24 hours.   Thanks, Orlando PennerMarie Breaunna Gottlieb, RN, MSN, CDE Diabetes Coordinator Inpatient Diabetes Program [phone] (Team Pager) [phone] ([facility] office) [phone] [facility] office) [phone] [facility] office)

## 2016-08-23 NOTE — Clinical Social Work Note (Signed)
Clinical Social Work Assessment  Patient Details  Name: [name] [name] MRN: [id number] Date of Birth: 1934-03-24  Date of referral:  08/23/16               Reason for consult:  Facility Placement                Permission sought to share information with:  Family Supports, Oceanographeracility Contact Representative Permission granted to share information::  Yes, Verbal Permission Granted  Name::        Agency::     Relationship::     Contact Information:     Housing/Transportation Living arrangements for the past 2 months:  Single Family Home Source of Information:   (sister of patient: [name] [name]: [phone]) Patient Interpreter Needed:  None Criminal Activity/Legal Involvement Pertinent to Current Situation/Hospitalization:  No - Comment as needed Significant Relationships:  Siblings Lives with:   (granddaughter who recently broke her leg ) Do you feel safe going back to the place where you live?  Yes Need for family participation in patient care:  Yes (Comment)  Care giving concerns:  Patient resides with her granddaughter who is unable to assist in her care due to her having broken her leg recently.   Social Worker assessment / plan:  Patient's sister requested to speak with CSW and stated that patient would not be able to return home because she cannot care for herself. CSW explained the placement process and insurance process. Patient's sister and patient verbalized understanding. CSW was asked by patient and sister to contact Charlie at [phone] to discuss with her patient's situation as she was a Tourist information centre managercommunity social worker. CSW contacted Billey GoslingCharlie and was informed that she is a Sports coachcase manager with [employer] who provides patient aide services for a total of 12 hours per week. They have noticed patient is rapidly declining and feel as though she needs 24/7 care. Patient and sister gave permission for CSW to conduct bed search.   Employment status:  Retired Product/process development scientistnsurance information:  Managed  Medicare PT Recommendations:  Skilled Nursing Facility Information / Referral to community resources:  Skilled Nursing Facility  Patient/Family's Response to care:  Patient gave permission to speak with family and with Billey GoslingCharlie and gave permission for bedsearch but other than this, did not have much else to contribute to the conversation.   Patient/Family's Understanding of and Emotional Response to Diagnosis, Current Treatment, and Prognosis:  Patient was in a pleasant mood. Emotional Assessment Appearance:  Appears stated age Attitude/Demeanor/Rapport:   (quiet but gave verbal permission for me to speak with her sister ) Affect (typically observed):  Accepting, Adaptable Orientation:  Oriented to Self, Oriented to Situation, Oriented to Place Alcohol / Substance use:  Not Applicable Psych involvement (Current and /or in the community):  No (Comment)  Discharge Needs  Concerns to be addressed:  Care Coordination Readmission within the last 30 days:  No Current discharge risk:  None Barriers to Discharge:  No Barriers Identified   York SpanielMonica Kimerly Rowand, LCSW 08/23/2016, 3:43 PM

## 2016-08-23 NOTE — Consult Note (Signed)
Patient ID: [name] [name], female   DOB: 07-25-1934, 82 y.o.   MRN: [id number]  CC: Abdominal wound infection  HPI [name] [name] is a 82 y.o. female who is currently admitted to the medicine service for treatment of an infected abdominal wound. Surgery consult was requested by Dr.Vachhani for further evaluation of her abdominal wound. Patient reports that the area began to swell 2 days before coming to the emergency department. It was drained in the ER. She says she is feeling much better than when she came in. She currently denies any fevers, chills, nausea, vomiting, chest pain, shortness of breath, constipation, diarrhea. Her only complaint is of the foul smell coming from the open wound. [name] known to have diabetes and states the area of infection is where she is been injecting insulin into herself.  HPI  Past Medical History:  Diagnosis Date  . Diabetes mellitus without complication (HCC)   . Hypertension   . Stroke [facility](HCC)     Surgical history: Hysterectomy, cervical spine fusion  Family history: Diabetes, CVA, prostate cancer. All needle extended family.  Social History Social History  Substance Use Topics  . Smoking status: Former Smoker    Types: Cigarettes  . Smokeless tobacco: Never Used  . Alcohol use No  [name] a widow who lives with her daughter.  No Known Allergies  Current Facility-Administered Medications  Medication Dose Route Frequency Provider Last Rate Last Dose  . acetaminophen (TYLENOL) tablet 650 mg  650 mg Oral Q6H PRN Alexis Hugelmeyer, DO       Or  . acetaminophen (TYLENOL) suppository 650 mg  650 mg Rectal Q6H PRN Alexis Hugelmeyer, DO      . atorvastatin (LIPITOR) tablet 10 mg  10 mg Oral Daily Alexis Hugelmeyer, DO   10 mg at 08/22/16 1126  . bisacodyl (DULCOLAX) EC tablet 5 mg  5 mg Oral Daily PRN Alexis Hugelmeyer, DO      . enoxaparin (LOVENOX) injection 40 mg  40 mg Subcutaneous QHS Alexis Hugelmeyer, DO   40 mg at 08/22/16 2203  .  gabapentin (NEURONTIN) capsule 100 mg  100 mg Oral Daily Alexis Hugelmeyer, DO   100 mg at 08/22/16 1125  . HYDROcodone-acetaminophen (NORCO/VICODIN) 5-325 MG per tablet 1-2 tablet  1-2 tablet Oral Q4H PRN Alexis Hugelmeyer, DO      . insulin aspart (novoLOG) injection 0-15 Units  0-15 Units Subcutaneous TID WC Alexis Hugelmeyer, DO   5 Units at 08/23/16 0853  . insulin aspart (novoLOG) injection 0-5 Units  0-5 Units Subcutaneous QHS Alexis Hugelmeyer, DO   3 Units at 08/22/16 2204  . insulin aspart protamine- aspart (NOVOLOG MIX 70/30) injection 38 Units  38 Units Subcutaneous Q breakfast Milagros LollSrikar Sudini, MD   38 Units at 08/23/16 [id number]  . ipratropium-albuterol (DUONEB) 0.5-2.5 (3) MG/3ML nebulizer solution 3 mL  3 mL Nebulization Q6H PRN Alexis Hugelmeyer, DO      . lidocaine-EPINEPHrine (XYLOCAINE-EPINEPHrine) 1 %-1:200000 (PF) injection 30 mL  30 mL Intradermal Once Phineas SemenGraydon Goodman, MD      . magnesium citrate solution 1 Bottle  1 Bottle Oral Once PRN Alexis Hugelmeyer, DO      . ondansetron (ZOFRAN) tablet 4 mg  4 mg Oral Q6H PRN Alexis Hugelmeyer, DO       Or  . ondansetron (ZOFRAN) injection 4 mg  4 mg Intravenous Q6H PRN Alexis Hugelmeyer, DO      . piperacillin-tazobactam (ZOSYN) IVPB 3.375 g  3.375 g Intravenous Q8H Milagros LollSrikar Sudini, MD  3.375 g at 08/23/16 0517  . potassium chloride SA (K-DUR,KLOR-CON) CR tablet 20 mEq  20 mEq Oral Daily Alexis Hugelmeyer, DO   20 mEq at 08/22/16 1125  . senna-docusate (Senokot-S) tablet 1 tablet  1 tablet Oral QHS PRN Alexis Hugelmeyer, DO      . sodium hypochlorite (DAKIN'S 1/4 STRENGTH) topical solution 1,300 application  1,300 application Irrigation BID Ricarda Frame, MD      . vancomycin (VANCOCIN) IVPB 750 mg/150 ml premix  750 mg Intravenous Q24H Alexis Hugelmeyer, DO   750 mg at 08/22/16 1235  . Vitamin D (Ergocalciferol) (DRISDOL) capsule 50,000 Units  50,000 Units Oral Weekly Alexis Hugelmeyer, DO   50,000 Units at 08/22/16 1125  . zolpidem  (AMBIEN) tablet 5 mg  5 mg Oral QHS PRN Alexis Hugelmeyer, DO         Review of Systems A Multi-point review of systems was asked and was negative except for the findings documented in the history of present illness  Physical Exam Blood pressure (!) 137/57, pulse 79, temperature 98.6 F (37 C), temperature source Oral, resp. rate 20, height 5\' 2"  (1.575 m), weight 72.6 kg (160 lb), SpO2 96 %. CONSTITUTIONAL: Resting in bed in no acute distress. EYES: Pupils are equal, round, and reactive to light, Sclera are non-icteric. EARS, NOSE, MOUTH AND THROAT: The oropharynx is clear. The oral mucosa is pink and moist. Hearing is intact to voice. LYMPH NODES:  Lymph nodes in the neck are normal. RESPIRATORY:  Lungs are clear. There is normal respiratory effort, with equal breath sounds bilaterally, and without pathologic use of accessory muscles. CARDIOVASCULAR: Heart is regular without murmurs, gallops, or rubs. GI: The abdomen is soft, and nondistended. There is an obvious wound just below her umbilicus. The opening in the skin measures approximately 1.5 cm and surrounded with what appears to be necrotic material. There is mild peripheral cellulitis that extends approximately 1 cm from the wound bed edges. Induration extends additional 3 cm beyond that in all directions. The areas appropriately tender to palpation when pressed upon.. There is no hepatosplenomegaly. There are normal bowel sounds in all quadrants. GU: Rectal deferred.   MUSCULOSKELETAL: Normal muscle strength and tone. No cyanosis or edema.   SKIN: Turgor is good and there are no pathologic skin lesions or ulcers. NEUROLOGIC: Motor and sensation is grossly normal. Cranial nerves are grossly intact. PSYCH:  Oriented to person, place and time. Affect is normal.  Data Reviewed Images and labs reviewed. Culture of the wound currently showing gram-positive cocci in pairs and gram variable rods. Most recent CBC is from yesterday with a white  blood cell count 9.9. Patient has numerous glucose measurements that are elevated with most recent being at 250. The remainder of her labs are unremarkable. There are no images associated with this encounter. I have personally reviewed the patient's imaging, laboratory findings and medical records.    Assessment    Infected abdominal wound    Plan    82 year old female with an infected abdominal wound secondary to self injections with insulin. Per the patient and the nurse the area is improving. The area was incised and drained by the emergency department at the time of admission. Although the areas improving, there continues to be a foul odor coming from the wound. The wound is widely drained however we will change the dressings to twice daily Dakin soaked gauze and a wet-to-dry fashion. Discussed this treatment plan with the nursing voiced understanding. Discussed with the patient that this  would help clear the infection and all questions were answered to her satisfaction. Recommend continuing to work for better control of her diabetes. Continue IV antibiotics until culture provides a more targeted therapy. Surgery will follow with you while she is an inpatient.     Time spent with the patient was 80 minutes, with more than 50% of the time spent in face-to-face education, counseling and care coordination.     Ricarda Frame, MD FACS General Surgeon 08/23/2016, 11:25 AM

## 2016-08-24 LAB — GLUCOSE, CAPILLARY
GLUCOSE-CAPILLARY: 133 mg/dL — AB (ref 65–99)
GLUCOSE-CAPILLARY: 260 mg/dL — AB (ref 65–99)
GLUCOSE-CAPILLARY: 286 mg/dL — AB (ref 65–99)
Glucose-Capillary: 239 mg/dL — ABNORMAL HIGH (ref 65–99)

## 2016-08-24 NOTE — Progress Notes (Signed)
CC: Abdominal wound Subjective: Patient reports feeling much better today. No complaints of pain. Tolerating a diet and having bowel function.  Objective: Vital signs in last 24 hours: Temp:  [98.2 F (36.8 C)-99.4 F (37.4 C)] 98.6 F (37 C) (09/06 0400) Pulse Rate:  [79-85] 79 (09/06 0400) Resp:  [16-20] 20 (09/06 0400) BP: (147-154)/(62-70) 149/62 (09/06 0400) SpO2:  [94 %-96 %] 94 % (09/06 0400) Last BM Date: 08/23/16  Intake/Output from previous day: 09/05 0701 - 09/06 0700 In: 710 [P.O.:360; IV Piggyback:350] Out: -  Intake/Output this shift: Total I/O In: 240 [P.O.:240] Out: -   Physical exam:  Gen.: No acute distress Chest: Clear to auscultation Heart: Regular rhythm Abdomen: Soft, nondistended, minimally tender to palpation around the abscess site. Abscess with Dakin solution dressing in place. Odor much improved. Retreating cellulitis with appropriate tenderness at the skin opening.  Lab Results: CBC   Recent Labs  08/21/16 1818 08/22/16 0634  WBC 12.6* 9.9  HGB 12.4 10.2*  HCT 37.6 30.0*  PLT 231 211   BMET  Recent Labs  08/21/16 1818 08/22/16 0634  NA 133* 139  K 4.8 3.9  CL 96* 107  CO2 27 30  GLUCOSE 525* 54*  BUN 25* 27*  CREATININE 1.24* 1.08*  CALCIUM 8.5* 8.1*   PT/INR No results for input(s): LABPROT, INR in the last 72 hours. ABG No results for input(s): PHART, HCO3 in the last 72 hours.  Invalid input(s): PCO2, PO2  Studies/Results: No results found.  Anti-infectives: Anti-infectives    Start     Dose/Rate Route Frequency Ordered Stop   08/22/16 1400  piperacillin-tazobactam (ZOSYN) IVPB 3.375 g     3.375 g 12.5 mL/hr over 240 Minutes Intravenous Every 8 hours 08/22/16 1152     08/22/16 1200  vancomycin (VANCOCIN) IVPB 750 mg/150 ml premix     750 mg 150 mL/hr over 60 Minutes Intravenous Every 24 hours 08/21/16 2329     08/21/16 1945  vancomycin (VANCOCIN) IVPB 1000 mg/200 mL premix     1,000 mg 200 mL/hr over 60  Minutes Intravenous  Once 08/21/16 1935 08/21/16 2058      Assessment/Plan:  82 year old female with abdominal abscess secondary to insulin injection. Doing much better. She will need continued wound care but does not appear to need any further opening of the wound. Would recommend continuing Dakin solution as part of her wound care. Continue antibiotics. Surgery will continue to follow with you.  Katilin Raynes T. Tonita CongWoodham, MD, FACS  08/24/2016

## 2016-08-24 NOTE — Progress Notes (Signed)
[facility] [place] [facility]   PATIENT NAME: [name] [name]    MR#:  [id number]  DATE OF BIRTH:  [birth year]  SUBJECTIVE:   Feels well. Mild pain around the abdominal wound. She was taking her insulin shots in the same site every day. Feels weak REVIEW OF SYSTEMS:    Review of Systems  Constitutional: Positive for malaise/fatigue. Negative for chills and fever.  HENT: Negative for sore throat.   Eyes: Negative for blurred vision, double vision and pain.  Respiratory: Negative for cough, hemoptysis, shortness of breath and wheezing.   Cardiovascular: Negative for chest pain, palpitations, orthopnea and leg swelling.  Gastrointestinal: Negative for abdominal pain, constipation, diarrhea, heartburn, nausea and vomiting.  Genitourinary: Negative for dysuria and hematuria.  Musculoskeletal: Negative for back pain and joint pain.  Skin: Negative for rash.  Neurological: Negative for sensory change, speech change, focal weakness and headaches.  Endo/Heme/Allergies: Does not bruise/bleed easily.  Psychiatric/Behavioral: Negative for depression. The patient is not nervous/anxious.     DRUG ALLERGIES:  No Known Allergies  VITALS:  Blood pressure (!) 149/62, pulse 79, temperature 98.6 F (37 C), temperature source Oral, resp. rate 20, height 5\' 2"  (1.575 m), weight 72.6 kg (160 lb), SpO2 94 %.  PHYSICAL EXAMINATION:   Physical Exam  GENERAL:  82 y.o.-year-old patient lying in the bed with no acute distress. obese EYES: Pupils equal, round, reactive to light and accommodation. No scleral icterus. Extraocular muscles intact.  HEENT: Head atraumatic, normocephalic. Oropharynx and nasopharynx clear.  NECK:  Supple, no jugular venous distention. No thyroid enlargement, no tenderness.  LUNGS: Normal breath sounds bilaterally, no wheezing, rales, rhonchi. No use of accessory muscles of respiration.  CARDIOVASCULAR: S1, S2 normal. No murmurs, rubs, or  gallops.  ABDOMEN: Soft, nondistended. Bowel sounds present. No organomegaly or mass. Open Wound on lower abdominal wall 2 x 2 cm with serosanguineous foul-smelling discharge. Rounding erythema for 3-4 cm surrounding the wound all the sides. EXTREMITIES: No cyanosis, clubbing or edema b/l.    NEUROLOGIC: Cranial nerves II through XII are intact. No focal Motor or sensory deficits b/l.   PSYCHIATRIC: The patient is alert and oriented x 3.  SKIN: No obvious rash, lesion, or ulcer.   LABORATORY PANEL:   CBC  Recent Labs Lab 08/22/16 0634  WBC 9.9  HGB 10.2*  HCT 30.0*  PLT 211   ------------------------------------------------------------------------------------------------------------------ Chemistries   Recent Labs Lab 08/21/16 2255 08/22/16 0634  NA  --  139  K  --  3.9  CL  --  107  CO2  --  30  GLUCOSE  --  54*  BUN  --  27*  CREATININE  --  1.08*  CALCIUM  --  8.1*  MG 2.2  --    ------------------------------------------------------------------------------------------------------------------  Cardiac Enzymes No results for input(s): TROPONINI in the last 168 hours. ------------------------------------------------------------------------------------------------------------------  RADIOLOGY:  No results found.   ASSESSMENT AND PLAN:   * Abdominal abscess at insulin injection site S/p I &D Sent wound cx swab -result pending Dressing changes twice daily per surgery On IV vancomycin and Zosyn. Foul smelling discharge.  *  IDDM Continue 70/30 dose and  SSI Diabetic diet  * Hypertension Continue home medications- stable.  * DVT prophylaxis with Lovenox  * Generalized weakness  PT recommends SNF. Pt agreeable  All the records are reviewed and case discussed with Care Management/Social Workerr. Management plans discussed with the patient, family and they are in agreement.  CODE STATUS: FULL CODE  DVT Prophylaxis: SCDs  TOTAL TIME TAKING CARE OF THIS  PATIENT: 30 minutes.   POSSIBLE D/C IN 1-2 DAYS, DEPENDING ON CLINICAL CONDITION.  Kristyl Athens M.D on 08/24/2016 at 12:31 PM  Between 7am to 6pm - Pager - [phone]  After 6pm go to [email or web address] - password [email or web address]  [facility]  Office  [phone]  CC: Primary care physician; Barbette Reichmann, MD  Note: This dictation was prepared with Dragon dictation along with smaller phrase technology. Any transcriptional errors that result from this process are unintentional.

## 2016-08-24 NOTE — Clinical Social Work Note (Signed)
Bed offers are available and CSW attempted to speak with patient this morning but she was confused and unaware of time, situation, or place. She was unable to follow my questions. CSW contacted her sister, [name] [name], and left a message in order to extend bed offers. The physician informed CSW that discharge is anticipated for tomorrow. CSW has updated Medicare [facility] rep: Amy regarding need for review for auth once a facility has been chosen. York SpanielMonica Pakou [name] MSW,LCSW [phone]

## 2016-08-24 NOTE — Plan of Care (Signed)
Problem: Education: Goal: Knowledge of [place] [facility] Education information/materials will improve Outcome: Not Progressing [name] confused at times.  Needs assistance.  Problem: Health Behavior/Discharge Planning: Goal: Ability to manage health-related needs will improve Outcome: Not Progressing Needs family assistance.

## 2016-08-24 NOTE — Progress Notes (Signed)
PT Cancellation Note  Patient Details Name: [name] [name] MRN: [id number] DOB: 1934-02-06   Cancelled Treatment:    Reason Eval/Treat Not Completed: Other (comment). Treatment attempted; nursing assistant just initiating personal hygiene/change with pt, and state they will need some time. Re attempt treatment at a later time/date.   Kristeen MissHeidi Elizabeth Bishop, [place]PTA 08/24/2016, 3:40 PM

## 2016-08-24 NOTE — Clinical Social Work Note (Signed)
Second message left for patient's sister, Vee this afternoon to attempt to extend bed offers. York SpanielMonica Cortlan Dolin MSW,LCSW [phone]

## 2016-08-25 DIAGNOSIS — S31109D Unspecified open wound of abdominal wall, unspecified quadrant without penetration into peritoneal cavity, subsequent encounter: Secondary | ICD-10-CM | POA: Diagnosis not present

## 2016-08-25 DIAGNOSIS — R739 Hyperglycemia, unspecified: Secondary | ICD-10-CM | POA: Diagnosis not present

## 2016-08-25 DIAGNOSIS — M1990 Unspecified osteoarthritis, unspecified site: Secondary | ICD-10-CM | POA: Diagnosis not present

## 2016-08-25 DIAGNOSIS — E784 Other hyperlipidemia: Secondary | ICD-10-CM | POA: Diagnosis not present

## 2016-08-25 DIAGNOSIS — N183 Chronic kidney disease, stage 3 (moderate): Secondary | ICD-10-CM | POA: Diagnosis not present

## 2016-08-25 DIAGNOSIS — I10 Essential (primary) hypertension: Secondary | ICD-10-CM | POA: Diagnosis not present

## 2016-08-25 DIAGNOSIS — E876 Hypokalemia: Secondary | ICD-10-CM | POA: Diagnosis not present

## 2016-08-25 DIAGNOSIS — Z7982 Long term (current) use of aspirin: Secondary | ICD-10-CM | POA: Diagnosis not present

## 2016-08-25 DIAGNOSIS — G629 Polyneuropathy, unspecified: Secondary | ICD-10-CM | POA: Diagnosis not present

## 2016-08-25 DIAGNOSIS — S31105A Unspecified open wound of abdominal wall, periumbilic region without penetration into peritoneal cavity, initial encounter: Secondary | ICD-10-CM | POA: Diagnosis not present

## 2016-08-25 DIAGNOSIS — R41841 Cognitive communication deficit: Secondary | ICD-10-CM | POA: Diagnosis not present

## 2016-08-25 DIAGNOSIS — R0602 Shortness of breath: Secondary | ICD-10-CM | POA: Diagnosis not present

## 2016-08-25 DIAGNOSIS — R0600 Dyspnea, unspecified: Secondary | ICD-10-CM | POA: Diagnosis present

## 2016-08-25 DIAGNOSIS — E1122 Type 2 diabetes mellitus with diabetic chronic kidney disease: Secondary | ICD-10-CM | POA: Diagnosis present

## 2016-08-25 DIAGNOSIS — R0902 Hypoxemia: Secondary | ICD-10-CM | POA: Diagnosis not present

## 2016-08-25 DIAGNOSIS — I130 Hypertensive heart and chronic kidney disease with heart failure and stage 1 through stage 4 chronic kidney disease, or unspecified chronic kidney disease: Secondary | ICD-10-CM | POA: Diagnosis not present

## 2016-08-25 DIAGNOSIS — L0291 Cutaneous abscess, unspecified: Secondary | ICD-10-CM | POA: Diagnosis not present

## 2016-08-25 DIAGNOSIS — Z794 Long term (current) use of insulin: Secondary | ICD-10-CM | POA: Diagnosis not present

## 2016-08-25 DIAGNOSIS — E1140 Type 2 diabetes mellitus with diabetic neuropathy, unspecified: Secondary | ICD-10-CM | POA: Diagnosis not present

## 2016-08-25 DIAGNOSIS — M6281 Muscle weakness (generalized): Secondary | ICD-10-CM | POA: Diagnosis not present

## 2016-08-25 DIAGNOSIS — Z8673 Personal history of transient ischemic attack (TIA), and cerebral infarction without residual deficits: Secondary | ICD-10-CM | POA: Diagnosis not present

## 2016-08-25 DIAGNOSIS — R262 Difficulty in walking, not elsewhere classified: Secondary | ICD-10-CM | POA: Diagnosis not present

## 2016-08-25 DIAGNOSIS — J9601 Acute respiratory failure with hypoxia: Secondary | ICD-10-CM | POA: Diagnosis not present

## 2016-08-25 DIAGNOSIS — L02211 Cutaneous abscess of abdominal wall: Secondary | ICD-10-CM | POA: Diagnosis not present

## 2016-08-25 DIAGNOSIS — D649 Anemia, unspecified: Secondary | ICD-10-CM | POA: Diagnosis not present

## 2016-08-25 DIAGNOSIS — E785 Hyperlipidemia, unspecified: Secondary | ICD-10-CM | POA: Diagnosis not present

## 2016-08-25 DIAGNOSIS — E119 Type 2 diabetes mellitus without complications: Secondary | ICD-10-CM | POA: Diagnosis not present

## 2016-08-25 DIAGNOSIS — Z79899 Other long term (current) drug therapy: Secondary | ICD-10-CM | POA: Diagnosis not present

## 2016-08-25 DIAGNOSIS — N179 Acute kidney failure, unspecified: Secondary | ICD-10-CM | POA: Diagnosis not present

## 2016-08-25 DIAGNOSIS — E11622 Type 2 diabetes mellitus with other skin ulcer: Secondary | ICD-10-CM | POA: Diagnosis not present

## 2016-08-25 DIAGNOSIS — I5033 Acute on chronic diastolic (congestive) heart failure: Secondary | ICD-10-CM | POA: Diagnosis not present

## 2016-08-25 DIAGNOSIS — X58XXXA Exposure to other specified factors, initial encounter: Secondary | ICD-10-CM | POA: Diagnosis not present

## 2016-08-25 DIAGNOSIS — Z87891 Personal history of nicotine dependence: Secondary | ICD-10-CM | POA: Diagnosis not present

## 2016-08-25 DIAGNOSIS — Z5189 Encounter for other specified aftercare: Secondary | ICD-10-CM | POA: Diagnosis not present

## 2016-08-25 DIAGNOSIS — R1312 Dysphagia, oropharyngeal phase: Secondary | ICD-10-CM | POA: Diagnosis not present

## 2016-08-25 LAB — GLUCOSE, CAPILLARY
GLUCOSE-CAPILLARY: 221 mg/dL — AB (ref 65–99)
GLUCOSE-CAPILLARY: 255 mg/dL — AB (ref 65–99)

## 2016-08-25 MED ORDER — INSULIN ASPART PROT & ASPART (70-30 MIX) 100 UNIT/ML ~~LOC~~ SUSP: 10.0000 [IU] | Freq: Every day | SUBCUTANEOUS | 11 refills | Status: DC

## 2016-08-25 MED ORDER — AMOXICILLIN-POT CLAVULANATE 875-125 MG PO TABS: 1.0000 | ORAL_TABLET | Freq: Two times a day (BID) | ORAL | 0 refills | Status: DC

## 2016-08-25 MED ORDER — INSULIN ASPART PROT & ASPART (70-30 MIX) 100 UNIT/ML ~~LOC~~ SUSP: 38.0000 [IU] | Freq: Every day | SUBCUTANEOUS | Status: DC

## 2016-08-25 MED ORDER — AMOXICILLIN-POT CLAVULANATE 875-125 MG PO TABS: 1.0000 | ORAL_TABLET | Freq: Two times a day (BID) | ORAL | Status: DC

## 2016-08-25 MED ORDER — INSULIN ASPART PROT & ASPART (70-30 MIX) 100 UNIT/ML ~~LOC~~ SUSP: 10.0000 [IU] | Freq: Every day | SUBCUTANEOUS | Status: DC

## 2016-08-25 MED ORDER — INSULIN ASPART PROT & ASPART (70-30 MIX) 100 UNIT/ML ~~LOC~~ SUSP: 38.0000 [IU] | Freq: Every day | SUBCUTANEOUS | 11 refills | Status: DC

## 2016-08-25 NOTE — Consult Note (Signed)
   [facility]   08/25/2016  [name] [name] [birth year] [id number]   [facility] received.  Chart reviewed. Noted discharge plan is for  SNF  There are no identifiable [facility] needs at this time. For questions please contact:  Raiford Nobletika Hall, MSN-Ed, RN,BSN [facility] Care [facility] Hospital Liaison [phone]

## 2016-08-25 NOTE — Plan of Care (Signed)
Problem: Education: Goal: Knowledge of [facility] Education information/materials will improve Outcome: Not Progressing Needs family assistance.  Problem: Health Behavior/Discharge Planning: Goal: Ability to manage health-related needs will improve Outcome: Not Progressing Needs family assistance.

## 2016-08-25 NOTE — Clinical Social Work Placement (Signed)
   CLINICAL SOCIAL WORK PLACEMENT  NOTE  Date:  08/25/2016  Patient Details  Name: [name] [name] MRN: [id number] Date of Birth: 1934/03/09  Clinical Social Work is seeking post-discharge placement for this patient at the Skilled  Nursing Facility level of care (*CSW will initial, date and re-position this form in  chart as items are completed):  Yes   Patient/family provided with [facility] Clinical Social Work Department's list of facilities offering this level of care within the geographic area requested by the patient (or if unable, by the patient's family).  Yes   Patient/family informed of their freedom to choose among providers that offer the needed level of care, that participate in Medicare, Medicaid or managed care program needed by the patient, have an available bed and are willing to accept the patient.  Yes   Patient/family informed of [facility]'s ownership interest in [facility] and [facility], as well as of the fact that they are under no obligation to receive care at these facilities.  PASRR submitted to EDS on 08/23/16     PASRR number received on 08/23/16     Existing PASRR number confirmed on       FL2 transmitted to all facilities in geographic area requested by pt/family on 08/23/16     FL2 transmitted to all facilities within larger geographic area on       Patient informed that his/her managed care company has contracts with or will negotiate with certain facilities, including the following:        Yes   Patient/family informed of bed offers received.  Patient chooses bed at  [facility])     Physician recommends and patient chooses bed at  [facility](SNF)    Patient to be transferred to  ([facility]) on 08/25/16.  Patient to be transferred to facility by  (sister)     Patient family notified on 08/25/16 of transfer.  Name of family member notified:   (sister: Vee)     PHYSICIAN       Additional Comment:     _______________________________________________ York SpanielMonica Austin Pongratz, LCSW 08/25/2016, 2:00 PM

## 2016-08-25 NOTE — Care Management Important Message (Signed)
Important Message  Patient Details  Name: Frederich Chickrnell Gallego MRN: [id number] Date of Birth: 1934/02/09   Medicare Important Message Given:  Yes    Gwenette GreetBrenda S Tereasa Yilmaz, RN 08/25/2016, 2:18 PM

## 2016-08-25 NOTE — Discharge Summary (Signed)
[facility] [place] [facility]   PATIENT NAME: [name] [name]    MR#:  [id number]  DATE OF BIRTH:  18-Mar-1934  DATE OF ADMISSION:  08/21/2016 ADMITTING PHYSICIAN: Tonye Royalty, DO  DATE OF DISCHARGE: 08/25/16  PRIMARY CARE PHYSICIAN: Barbette Reichmann, MD    ADMISSION DIAGNOSIS:  Abscess [L02.91] Hyperglycemia [R73.9] Hypoxia [R09.02]  DISCHARGE DIAGNOSIS:  Abdominal abscess s/p I and D DM-2  SECONDARY DIAGNOSIS:   Past Medical History:  Diagnosis Date  . Diabetes mellitus without complication (HCC)   . Hypertension   . Stroke [facility])     HOSPITAL COURSE:   * Abdominal abscess at insulin injection site S/p I &D Sent wound cx swab -shows proteus and Providencia Dressing changes twice daily per surgery On IV vancomycin and Zosyn.---change to po augmentin -dressing changes per Surgeon instructions.  *  IDDM Continue 70/30 dose and  SSI Diabetic diet  * Hypertension Continue home medications- stable.  * DVT prophylaxis with Lovenox  * Generalized weakness  PT recommends SNF. Pt agreeable  Overall improving D/c to SNF today CONSULTS OBTAINED:  Treatment Team:  Altamese Dilling, MD Ricarda Frame, MD  DRUG ALLERGIES:  No Known Allergies  DISCHARGE MEDICATIONS:   Current Discharge Medication List    START taking these medications   Details  amoxicillin-clavulanate (AUGMENTIN) 875-125 MG tablet Take 1 tablet by mouth every 12 (twelve) hours. Qty: 18 tablet, Refills: 0    !! insulin aspart protamine- aspart (NOVOLOG MIX 70/30) (70-30) 100 UNIT/ML injection Inject 0.38 mLs (38 Units total) into the skin daily with breakfast. Qty: 10 mL, Refills: 11    !! insulin aspart protamine- aspart (NOVOLOG MIX 70/30) (70-30) 100 UNIT/ML injection Inject 0.1 mLs (10 Units total) into the skin daily with supper. Qty: 10 mL, Refills: 11     !! - Potential duplicate medications found. Please discuss with provider.     CONTINUE these medications which have NOT CHANGED   Details  atorvastatin (LIPITOR) 10 MG tablet Take 1 tablet by mouth daily.    gabapentin (NEURONTIN) 100 MG capsule Take 1 capsule by mouth daily.    KLOR-CON M20 20 MEQ tablet Take 1 tablet by mouth daily.    Vitamin D, Ergocalciferol, (DRISDOL) 50000 units CAPS capsule Take 1 capsule by mouth once a week.      STOP taking these medications     NOVOLIN 70/30 (70-30) 100 UNIT/ML injection         If you experience worsening of your admission symptoms, develop shortness of breath, life threatening emergency, suicidal or homicidal thoughts you must seek medical attention immediately by calling 911 or calling your MD immediately  if symptoms less severe.  You Must read complete instructions/literature along with all the possible adverse reactions/side effects for all the Medicines you take and that have been prescribed to you. Take any new Medicines after you have completely understood and accept all the possible adverse reactions/side effects.   Please note  You were cared for by a hospitalist during your hospital stay. If you have any questions about your discharge medications or the care you received while you were in the hospital after you are discharged, you can call the unit and asked to speak with the hospitalist on call if the hospitalist that took care of you is not available. Once you are discharged, your primary care physician will handle any further medical issues. Please note that NO REFILLS for any discharge medications will be authorized once you are discharged, as  it is imperative that you return to your primary care physician (or establish a relationship with a primary care physician if you do not have one) for your aftercare needs so that they can reassess your need for medications and monitor your lab values. Today   SUBJECTIVE   Doing ok  VITAL SIGNS:  Blood pressure (!) 134/50, pulse 78, temperature 98.2 F (36.8  C), temperature source Oral, resp. rate 17, height 5\' 2"  (1.575 m), weight 72.6 kg (160 lb), SpO2 97 %.  I/O:   Intake/Output Summary (Last 24 hours) at 08/25/16 1333 Last data filed at 08/25/16 0900  Gross per 24 hour  Intake              590 ml  Output                0 ml  Net              590 ml    PHYSICAL EXAMINATION:  GENERAL:  82 y.o.-year-old patient lying in the bed with no acute distress.  EYES: Pupils equal, round, reactive to light and accommodation. No scleral icterus. Extraocular muscles intact.  HEENT: Head atraumatic, normocephalic. Oropharynx and nasopharynx clear.  NECK:  Supple, no jugular venous distention. No thyroid enlargement, no tenderness.  LUNGS: Normal breath sounds bilaterally, no wheezing, rales,rhonchi or crepitation. No use of accessory muscles of respiration.  CARDIOVASCULAR: S1, S2 normal. No murmurs, rubs, or gallops.  ABDOMEN: Soft, non-tender, non-distended. Bowel sounds present. No organomegaly or mass. Abdominal dressing+ EXTREMITIES: No pedal edema, cyanosis, or clubbing.  NEUROLOGIC: Cranial nerves II through XII are intact. Muscle strength 5/5 in all extremities. Sensation intact. Gait not checked.  PSYCHIATRIC: The patient is alert and oriented x 3.  SKIN: No obvious rash, lesion, or ulcer.   DATA REVIEW:   CBC   Recent Labs Lab 08/22/16 0634  WBC 9.9  HGB 10.2*  HCT 30.0*  PLT 211    Chemistries   Recent Labs Lab 08/21/16 2255 08/22/16 0634  NA  --  139  K  --  3.9  CL  --  107  CO2  --  30  GLUCOSE  --  54*  BUN  --  27*  CREATININE  --  1.08*  CALCIUM  --  8.1*  MG 2.2  --     Microbiology Results   Recent Results (from the past 240 hour(s))  Aerobic/Anaerobic Culture (surgical/deep wound)     Status: None (Preliminary result)   Collection Time: 08/22/16 10:38 AM  Result Value Ref Range Status   Specimen Description ABDOMEN  Final   Special Requests NONE  Final   Gram Stain   Final    FEW WBC PRESENT,BOTH  PMN AND MONONUCLEAR MODERATE GRAM VARIABLE ROD FEW GRAM POSITIVE COCCI IN PAIRS [facility]    Culture FEW PROVIDENCIA STUARTII FEW PROTEUS SPECIES   Final   Report Status PENDING  Incomplete   Organism ID, Bacteria PROVIDENCIA STUARTII  Final   Organism ID, Bacteria PROTEUS SPECIES  Final      Susceptibility   Proteus species - MIC*    AMPICILLIN >=32 RESISTANT Resistant     CEFAZOLIN >=64 RESISTANT Resistant     CEFEPIME <=1 SENSITIVE Sensitive     CEFTAZIDIME <=1 SENSITIVE Sensitive     CEFTRIAXONE <=1 SENSITIVE Sensitive     CIPROFLOXACIN <=0.25 SENSITIVE Sensitive     GENTAMICIN <=1 SENSITIVE Sensitive     IMIPENEM 8 INTERMEDIATE Intermediate  TRIMETH/SULFA <=20 SENSITIVE Sensitive     AMPICILLIN/SULBACTAM 8 SENSITIVE Sensitive     PIP/TAZO <=4 SENSITIVE Sensitive     * FEW PROTEUS SPECIES   Providencia stuartii - MIC*    AMPICILLIN 16 RESISTANT Resistant     CEFAZOLIN >=64 RESISTANT Resistant     CEFEPIME <=1 SENSITIVE Sensitive     CEFTAZIDIME <=1 SENSITIVE Sensitive     CEFTRIAXONE <=1 SENSITIVE Sensitive     CIPROFLOXACIN <=0.25 SENSITIVE Sensitive     GENTAMICIN 4 RESISTANT Resistant     IMIPENEM 2 SENSITIVE Sensitive     TRIMETH/SULFA <=20 SENSITIVE Sensitive     AMPICILLIN/SULBACTAM 4 SENSITIVE Sensitive     PIP/TAZO <=4 SENSITIVE Sensitive     * FEW PROVIDENCIA STUARTII    RADIOLOGY:  No results found.   Management plans discussed with the patient, family and they are in agreement.  CODE STATUS:     Code Status Orders        Start     Ordered   08/21/16 2324  Full code  Continuous     08/21/16 2323    Code Status History    Date Active Date Inactive Code Status Order ID Comments User Context   This patient has a current code status but no historical code status.      TOTAL TIME TAKING CARE OF THIS PATIENT: 40  minutes.    Raeden Belzer M.D on 08/25/2016 at 1:33 PM  Between 7am to 6pm - Pager - [phone] After 6pm  go to [email or web address] - password [email or web address] [facility]  [place] [facility]  Office  [phone]  CC: Primary care physician; Barbette Reichmann, MD

## 2016-08-25 NOTE — Clinical Social Work Note (Signed)
CSW was able to reach Vee this morning and she chose [facility] for patient due to location and also due to the fact that she has heard good things about the facility. [facility]: [id number]. Jomarie LongsJoseph at [facility] is aware and nurse to call report. Patient's sister to transport.  York SpanielMonica Ramiyah Mcclenahan MSW,LCSW [phone]

## 2016-08-25 NOTE — Progress Notes (Signed)
Inpatient Diabetes Program Recommendations  AACE/ADA: New Consensus Statement on Inpatient Glycemic Control (2015)  Target Ranges:  Prepandial:   less than 140 mg/dL      [facility]ak postprandial:   less than 180 mg/dL (1-2 hours)      Critically ill patients:  140 - 180 mg/dL   Lab Results  Component Value Date   GLUCAP 221 (H) 08/25/2016   HGBA1C 9.1 (H) 08/22/2016    Review of Glycemic Control  Results for [name], [name] (MRN [id number]) as of 08/25/2016 07:50  Ref. Range 08/24/2016 07:42 08/24/2016 11:40 08/24/2016 16:33 08/24/2016 21:44 08/25/2016 07:35  Glucose-Capillary Latest Ref Range: 65 - 99 mg/dL 829133 (H) 562239 (H) 130260 (H) 286 (H) 221 (H)    Diabetes history: DM2 Outpatient Diabetes medications: 70/30 28 units q am  Current orders for Inpatient glycemic control: 70/30 38 units q am + Novolog correction 0-15 units tid + 0-5 units hs   Recommendations: Blood sugar remains elevated- consider adding Novolog mix 70/30 10 units pre-supper.   Susette RacerJulie Caroleann Casler, RN, BA, MHA, CDE Diabetes Coordinator Inpatient Diabetes Program  [phone] (Team Pager) [phone] [facility] Office) 08/25/2016 7:55 AM

## 2016-08-25 NOTE — Progress Notes (Signed)
08/25/2016 2:17 PM  Called report to receiving nurse Kim at [facility].  Answered all questions and gave my phone number in case further info is required.  Bradly Chrisougherty, Copelyn Widmer E, RN

## 2016-08-25 NOTE — Progress Notes (Signed)
Physical Therapy Treatment Patient Details Name: [name] [name] MRN: [id number] DOB: 04/14/1934 Today's Date: 08/25/2016    History of Present Illness Pt is an 82 y.o. female presenting to hospital with redness and swelling to stomache.  Pt admitted with cellulitis and abscess of abdominal wall secondary to insulin administration; s/p I&D 08/21/16.  PMH includes DM, htn, stroke, c-spine fusion.    PT Comments    Pt agreeable to PT. Reports only mild discomfort in abdominal incision. Pt complains of poor left eye sight and weakness from a previous stroke? Pt very talkative with many topics regarding her family. Pt requires increased cues, but less physical assist for bed mobility. Difficulty attaining balance, but able to maintain through exercises once attained. Pt subjectively notes feeling unsteady with slight movement of trunk. Requires 2 assist for stand with poor ability to attain full upright position. Stepping proves very difficult bed to chair. Pt comfortable up in chair. Continue PT to progress strength, endurance and balance to improve functional mobility.   Follow Up Recommendations  SNF     Equipment Recommendations       Recommendations for Other Services       Precautions / Restrictions Precautions Precautions: Fall Restrictions Weight Bearing Restrictions: No    Mobility  Bed Mobility Overal bed mobility: Needs Assistance Bed Mobility: Supine to Sit     Supine to sit: Min assist;HOB elevated     General bed mobility comments: Pt perfomred log roll with Mod I; assist to push to sit and find balance. Maintains balance with seated exercises with single UE support and feet supported  Transfers Overall transfer level: Needs assistance Equipment used: Rolling walker (2 wheeled) Transfers: Sit to/from Stand Sit to Stand: Mod assist;+2 physical assistance (Attempted with 1; unable to attain stand)         General transfer comment: Attempt with 1 person at bed  demonstrates heavy lean posterolateral to the R. 2 person assist with poor upright posture and leans R  Ambulation/Gait Ambulation/Gait assistance: Mod assist;+2 physical assistance Ambulation Distance (Feet): 3 Feet Assistive device: Rolling walker (2 wheeled) Gait Pattern/deviations: Step-to pattern;Decreased dorsiflexion - right;Decreased dorsiflexion - left;Decreased weight shift to left;Decreased step length - right;Decreased step length - left;Decreased stance time - left;Trunk flexed;Narrow base of support (heavy lean R)     General Gait Details: Poor ability to step and 2+ to maintain upright posture   Stairs            Wheelchair Mobility    Modified Rankin (Stroke Patients Only)       Balance Overall balance assessment: Needs assistance Sitting-balance support: Single extremity supported;Bilateral upper extremity supported;Feet supported Sitting balance-Leahy Scale: Fair Sitting balance - Comments: Diffiuculty attaining seated upright posture initially. Once assisted able to maintain, but subjectively notes unsteady feel with sligth movement out of base of support   Standing balance support: No upper extremity supported Standing balance-Leahy Scale: Poor Standing balance comment: Requires 2 person assist to maintain upright posture, which is poor/forward flexed                    Cognition Arousal/Alertness: Awake/alert Behavior During Therapy: WFL for tasks assessed/performed;Impulsive (brings up random conversations) Overall Cognitive Status: Difficult to assess       Memory: Decreased short-term memory              Exercises General Exercises - Lower Extremity Long Arc Quad: AROM;Both;20 reps;Seated Hip ABduction/ADduction: AROM;Both;20 reps;Seated (90/90 position) Hip Flexion/Marching: AROM;Both;20 reps;Seated Toe Raises: AROM;Both;20  reps;Seated Heel Raises: AROM;Both;20 reps;Seated    General Comments        Pertinent Vitals/Pain  Pain Assessment:  ("I know it's there, not bad" referring to abdominal incision) Pain Intervention(s): Monitored during session    Home Living                      Prior Function            PT Goals (current goals can now be found in the care plan section) Progress towards PT goals: Progressing toward goals (slowly)    Frequency  Min 2X/week    PT Plan Current plan remains appropriate    Co-evaluation             End of Session   Activity Tolerance: Patient limited by fatigue;Other (comment) (weakness and balance) Patient left: in chair;with chair alarm set;with SCD's reapplied;with call bell/phone within reach     Time: 1610-9604 PT Time Calculation (min) (ACUTE ONLY): 32 min  Charges:  $Gait Training: 8-22 mins $Therapeutic Exercise: 8-22 mins                    G Codes:      Kristeen Miss, PTA 08/25/2016, 11:53 AM

## 2016-08-25 NOTE — Progress Notes (Signed)
CC: Abdominal wall abscess Subjective: Patient reports no complaints this morning. Doing the same.  Objective: Vital signs in last 24 hours: Temp:  [98.2 F (36.8 C)-98.5 F (36.9 C)] 98.4 F (36.9 C) (09/07 0524) Pulse Rate:  [77-82] 82 (09/07 1114) Resp:  [16-20] 20 (09/07 0524) BP: (136-143)/(57-70) 140/70 (09/07 0524) SpO2:  [94 %-96 %] 96 % (09/07 1114) Last BM Date: 08/24/16  Intake/Output from previous day: 09/06 0701 - 09/07 0700 In: 540 [P.O.:240; IV Piggyback:300] Out: -  Intake/Output this shift: Total I/O In: 290 [P.O.:240; IV Piggyback:50] Out: -   Physical exam:  Gen.: No acute distress Chest: Clear to auscultation Heart: Regular rhythm Abdomen: Soft and nontender. Abdominal wall abscess with minimal hyperemia around the opening in the skin. No foul smell or obvious purulent drainage.  Lab Results: CBC  No results for input(s): WBC, HGB, HCT, PLT in the last 72 hours. BMET No results for input(s): NA, K, CL, CO2, GLUCOSE, BUN, CREATININE, CALCIUM in the last 72 hours. PT/INR No results for input(s): LABPROT, INR in the last 72 hours. ABG No results for input(s): PHART, HCO3 in the last 72 hours.  Invalid input(s): PCO2, PO2  Studies/Results: No results found.  Anti-infectives: Anti-infectives    Start     Dose/Rate Route Frequency Ordered Stop   08/22/16 1400  piperacillin-tazobactam (ZOSYN) IVPB 3.375 g     3.375 g 12.5 mL/hr over 240 Minutes Intravenous Every 8 hours 08/22/16 1152     08/22/16 1200  vancomycin (VANCOCIN) IVPB 750 mg/150 ml premix  Status:  Discontinued     750 mg 150 mL/hr over 60 Minutes Intravenous Every 24 hours 08/21/16 2329 08/25/16 0847   08/21/16 1945  vancomycin (VANCOCIN) IVPB 1000 mg/200 mL premix     1,000 mg 200 mL/hr over 60 Minutes Intravenous  Once 08/21/16 1935 08/21/16 2058      Assessment/Plan:  82 year old female with open abscess to the anterior abdominal wall. Continues to improve with twice a day  dressing changes. No indications for any further operative intervention at this time. Patient will need to continue with local wound care until it fully heals. If patient remains afebrile and without leukocytosis of be okay to transition to oral antibiotics for outpatient therapy. Surgery will continue to follow with you.  Amnah Breuer T. Tonita CongWoodham, MD, FACS  08/25/2016

## 2016-08-25 NOTE — Progress Notes (Signed)
08/25/2016  3:54 PM  [name] [name] to be D/C'd Skilled nursing facility per MD order.  Discussed prescriptions and follow up appointments with the patient. Prescriptions given to patient, medication list explained in detail. Pt verbalized understanding.    Medication List    STOP taking these medications   NOVOLIN 70/30 (70-30) 100 UNIT/ML injection Generic drug:  insulin NPH-regular Human     TAKE these medications   amoxicillin-clavulanate 875-125 MG tablet Commonly known as:  AUGMENTIN Take 1 tablet by mouth every 12 (twelve) hours.   atorvastatin 10 MG tablet Commonly known as:  LIPITOR Take 1 tablet by mouth daily.   gabapentin 100 MG capsule Commonly known as:  NEURONTIN Take 1 capsule by mouth daily.   insulin aspart protamine- aspart (70-30) 100 UNIT/ML injection Commonly known as:  NOVOLOG MIX 70/30 Inject 0.1 mLs (10 Units total) into the skin daily with supper.   insulin aspart protamine- aspart (70-30) 100 UNIT/ML injection Commonly known as:  NOVOLOG MIX 70/30 Inject 0.38 mLs (38 Units total) into the skin daily with breakfast. Start taking on:  08/26/2016   KLOR-CON M20 20 MEQ tablet Generic drug:  potassium chloride SA Take 1 tablet by mouth daily.   Vitamin D (Ergocalciferol) 50000 units Caps capsule Commonly known as:  DRISDOL Take 1 capsule by mouth once a week.       Vitals:   08/25/16 1114 08/25/16 1327  BP:  (!) 134/50  Pulse: 82 78  Resp:  17  Temp:  98.2 F (36.8 C)    Skin clean, dry and intact without evidence of skin break down, no evidence of skin tears noted. IV catheter discontinued intact. Site without signs and symptoms of complications. Dressing and pressure applied. Pt denies pain at this time. No complaints noted.  An After Visit Summary was printed and given to the patient. Patient escorted via WC, and D/C to SNF via private auto.  Bradly Chrisougherty, [name] E

## 2016-08-25 NOTE — Discharge Instructions (Signed)
Abdominal wound dressing changes per surgery instructions

## 2016-08-26 LAB — AEROBIC/ANAEROBIC CULTURE (SURGICAL/DEEP WOUND)

## 2016-08-26 LAB — AEROBIC/ANAEROBIC CULTURE W GRAM STAIN (SURGICAL/DEEP WOUND)

## 2016-08-27 DIAGNOSIS — E119 Type 2 diabetes mellitus without complications: Secondary | ICD-10-CM | POA: Diagnosis not present

## 2016-08-27 DIAGNOSIS — E784 Other hyperlipidemia: Secondary | ICD-10-CM | POA: Diagnosis not present

## 2016-08-27 DIAGNOSIS — G629 Polyneuropathy, unspecified: Secondary | ICD-10-CM | POA: Diagnosis not present

## 2016-08-27 DIAGNOSIS — I10 Essential (primary) hypertension: Secondary | ICD-10-CM | POA: Diagnosis not present

## 2016-09-01 ENCOUNTER — Other Ambulatory Visit: Payer: Self-pay

## 2016-09-01 ENCOUNTER — Ambulatory Visit (INDEPENDENT_AMBULATORY_CARE_PROVIDER_SITE_OTHER): Payer: Commercial Managed Care - HMO | Admitting: General Surgery

## 2016-09-01 ENCOUNTER — Encounter: Payer: Self-pay | Admitting: General Surgery

## 2016-09-01 VITALS — BP 143/93 | HR 85 | Temp 98.4°F

## 2016-09-01 DIAGNOSIS — S31109A Unspecified open wound of abdominal wall, unspecified quadrant without penetration into peritoneal cavity, initial encounter: Secondary | ICD-10-CM | POA: Insufficient documentation

## 2016-09-01 DIAGNOSIS — G5600 Carpal tunnel syndrome, unspecified upper limb: Secondary | ICD-10-CM | POA: Insufficient documentation

## 2016-09-01 DIAGNOSIS — S31109D Unspecified open wound of abdominal wall, unspecified quadrant without penetration into peritoneal cavity, subsequent encounter: Secondary | ICD-10-CM

## 2016-09-01 DIAGNOSIS — I739 Peripheral vascular disease, unspecified: Secondary | ICD-10-CM | POA: Insufficient documentation

## 2016-09-01 DIAGNOSIS — I10 Essential (primary) hypertension: Secondary | ICD-10-CM | POA: Insufficient documentation

## 2016-09-01 NOTE — Patient Instructions (Addendum)
Please continue to change dressing once a day or when needed.  Please give us a call if patient starts to have a fever, drainage, or redness.  We will send a referral to the [facility] so they could continue patient's care.

## 2016-09-01 NOTE — Progress Notes (Signed)
Outpatient Surgical Follow Up  09/01/2016  [name] [name] is an 82 y.o. female.   Chief Complaint  Patient presents with  . Follow-up    Hospital-abdominal abscess drained    HPI: A 82 year old female presents to clinic for follow-up from recent hospitalization. She is currently being cared for at [facility]. She had an abscess at the site of insulin injections to her abdominal wall that required incision and drainage last week. She is continuing daily wound care. She is unable to tell if she thinks it looks better or worse but states it does not hurt as much as it did last week. She is otherwise questioning about her mobility and her neuropathy. She denies any fevers, chills, nausea, vomiting, diarrhea, constipation.  Past Medical History:  Diagnosis Date  . Diabetes mellitus without complication (HCC)   . Hypertension   . Stroke [facility](HCC)     No past surgical history on file.  No family history on file.  Social History:  reports that she has quit smoking. Her smoking use included Cigarettes. She has never used smokeless tobacco. She reports that she does not drink alcohol or use drugs.  Allergies: No Known Allergies  Medications reviewed.    ROS A multipoint review of systems was completed. All pertinent positives and negatives are documented in the history of present illness remainder negative.   BP (!) 143/93   Pulse 85   Temp 98.4 F (36.9 C) (Oral)   Physical Exam Gen.: No acute distress Chest: Clear to auscultation Heart: Regular rhythm Abdomen: Soft, nontender, nondistended. Approximately 3 cm wide open wound to the lower anterior abdominal wall. There is no evidence of spreading erythema or purulent discharge. The open wound is filled with a fibrinous exudate and without any evidence of necrotic material or purulence.    No results found for this or any previous visit (from the past 48 hour(s)). No results found.  Assessment/Plan:  1. Open abdominal  wall wound, subsequent encounter 82 year old female with an open abdominal wound from an infected insulin injection site. Much improved from when she was in the hospital. Discussed with the patient and with the [facility] form that she is to continue daily wound care. At this point given the size of the wound she will need prolonged wound care so a referral to the wound care center will be placed today. The wound care center and can follow-up with the surgery clinic on an as-needed basis.     Ricarda Frameharles Kynsie Falkner, MD FACS General Surgeon  09/01/2016,10:12 AM

## 2016-09-02 DIAGNOSIS — E784 Other hyperlipidemia: Secondary | ICD-10-CM | POA: Diagnosis not present

## 2016-09-02 DIAGNOSIS — I10 Essential (primary) hypertension: Secondary | ICD-10-CM | POA: Diagnosis not present

## 2016-09-02 DIAGNOSIS — D649 Anemia, unspecified: Secondary | ICD-10-CM | POA: Diagnosis not present

## 2016-09-02 DIAGNOSIS — E119 Type 2 diabetes mellitus without complications: Secondary | ICD-10-CM | POA: Diagnosis not present

## 2016-09-02 DIAGNOSIS — G629 Polyneuropathy, unspecified: Secondary | ICD-10-CM | POA: Diagnosis not present

## 2016-09-05 ENCOUNTER — Telehealth: Payer: Self-pay | Admitting: Surgery

## 2016-09-05 NOTE — Telephone Encounter (Signed)
I have contacted the [facility] to check the status of referral. The receptionist has stated that she has tried calling the patient two times and left voicemail. I have also called the patient to advise her that they have been trying to contact her to make an appointment. No answer. I have also left a detailed message.

## 2016-09-09 ENCOUNTER — Telehealth: Payer: Self-pay

## 2016-09-09 ENCOUNTER — Encounter: Payer: Commercial Managed Care - HMO | Attending: Surgery | Admitting: Surgery

## 2016-09-09 DIAGNOSIS — E11622 Type 2 diabetes mellitus with other skin ulcer: Secondary | ICD-10-CM | POA: Diagnosis not present

## 2016-09-09 DIAGNOSIS — E1140 Type 2 diabetes mellitus with diabetic neuropathy, unspecified: Secondary | ICD-10-CM | POA: Insufficient documentation

## 2016-09-09 DIAGNOSIS — Z794 Long term (current) use of insulin: Secondary | ICD-10-CM | POA: Diagnosis not present

## 2016-09-09 DIAGNOSIS — M1990 Unspecified osteoarthritis, unspecified site: Secondary | ICD-10-CM | POA: Diagnosis not present

## 2016-09-09 DIAGNOSIS — E119 Type 2 diabetes mellitus without complications: Secondary | ICD-10-CM | POA: Diagnosis not present

## 2016-09-09 DIAGNOSIS — L02211 Cutaneous abscess of abdominal wall: Secondary | ICD-10-CM | POA: Diagnosis not present

## 2016-09-09 DIAGNOSIS — S31105A Unspecified open wound of abdominal wall, periumbilic region without penetration into peritoneal cavity, initial encounter: Secondary | ICD-10-CM | POA: Insufficient documentation

## 2016-09-09 DIAGNOSIS — Z8673 Personal history of transient ischemic attack (TIA), and cerebral infarction without residual deficits: Secondary | ICD-10-CM | POA: Diagnosis not present

## 2016-09-09 DIAGNOSIS — X58XXXA Exposure to other specified factors, initial encounter: Secondary | ICD-10-CM | POA: Insufficient documentation

## 2016-09-09 DIAGNOSIS — E784 Other hyperlipidemia: Secondary | ICD-10-CM | POA: Diagnosis not present

## 2016-09-09 DIAGNOSIS — I10 Essential (primary) hypertension: Secondary | ICD-10-CM | POA: Insufficient documentation

## 2016-09-09 DIAGNOSIS — G629 Polyneuropathy, unspecified: Secondary | ICD-10-CM | POA: Diagnosis not present

## 2016-09-09 NOTE — Telephone Encounter (Signed)
-----   Message from Nicole KindredAngela K Brouillard sent at 09/02/2016  2:41 PM EDT ----- Regarding: RE: Referral Referral has been sent. Patient has [employer] Gold Plus, I have left a message with PCP for referral.  ----- Message ----- From: Adela PortsMaritza Nafisa Olds, CMA Sent: 09/01/2016  11:42 AM To: Karna ChristmasAngela K Brouillard Subject: Referral                                       Can you please refer patient to Wound Care Center for Open abdominal wall wound.  Patient had an I&D done on 08/21/2016.  Thank you!

## 2016-09-09 NOTE — Telephone Encounter (Signed)
Patient has an appointment with the wound care center on 09/09/2016 with Dr. Meyer RusselBritto to take care of patient's wound.

## 2016-09-10 NOTE — Progress Notes (Signed)
Rebecca EatonMCTILLMAN, Taina ([id number]) Visit Report for 09/09/2016 Abuse/Suicide Risk Screen Details Patient Name: [name] [name], [name] [name] Date of Service: 09/09/2016 3:00 PM Medical Record Patient Account Number: [id number] Number: Afful, RN, BSN, Treating RN: 11-12-1934 (82 y.o. [place] Date of Birth/Sex: Female) Other Clinician: Primary Care Physician: Barbette ReichmannHande, Vishwanath Treating Britto, Errol Referring Physician: Ricarda FrameWOODHAM, CHARLES Physician/Extender: Weeks in Treatment: 0 Abuse/Suicide Risk Screen Items Answer ABUSE/SUICIDE RISK SCREEN: Has anyone close to you tried to hurt or harm you recentlyo No Do you feel uncomfortable with anyone in your familyo No Has anyone forced you do things that you didnot want to doo No Do you have any thoughts of harming yourselfo No Patient displays signs or symptoms of abuse and/or neglect. No Electronic Signature(s) Signed: 09/09/2016 4:34:10 PM By: Elpidio EricAfful, Rita BSN, RN Entered By: Elpidio EricAfful, Rita on 09/09/2016 14:48:20 Manard, Yurani ([id number]) -------------------------------------------------------------------------------- Activities of Daily Living Details Patient Name: [name] [name], [name] [name] Date of Service: 09/09/2016 3:00 PM Medical Record Patient Account Number: [id number] Number: Afful, RN, BSN, Treating RN: 11-12-1934 (82 y.o. [place] Date of Birth/Sex: Female) Other Clinician: Primary Care Physician: Barbette ReichmannHande, Vishwanath Treating Britto, Errol Referring Physician: Ricarda FrameWOODHAM, CHARLES Physician/Extender: Weeks in Treatment: 0 Activities of Daily Living Items Answer Activities of Daily Living (Please select one for each item) Drive Automobile Need Assistance Take Medications Need Assistance Use Telephone Need Assistance Care for Appearance Need Assistance Use Toilet Need Assistance Bath / Shower Need Assistance Dress Self Need Assistance Feed Self Need Assistance Walk Need Assistance Get In / Out Bed Need Assistance Housework Need  Assistance Prepare Meals Need Assistance Handle Money Need Assistance Shop for Self Need Assistance Electronic Signature(s) Signed: 09/09/2016 4:34:10 PM By: Elpidio EricAfful, Rita BSN, RN Entered By: Elpidio EricAfful, Rita on 09/09/2016 14:48:52 Railsback, Adhira ([id number]) -------------------------------------------------------------------------------- Education Assessment Details Patient Name: [name] [name], [name] [name] Date of Service: 09/09/2016 3:00 PM Medical Record Patient Account Number: [id number] Number: Afful, RN, BSN, Treating RN: 11-12-1934 (82 y.o. [place] Date of Birth/Sex: Female) Other Clinician: Primary Care Physician: Marcello FennelHande, Vishwanath Treating Britto, Errol Referring Physician: Ricarda FrameWOODHAM, CHARLES Physician/Extender: Tania AdeWeeks in Treatment: 0 Primary Learner Assessed: Patient Learning Preferences/Education Level/Primary Language Learning Preference: Explanation Highest Education Level: High School Preferred Language: English Cognitive Barrier Assessment/Beliefs Language Barrier: No Physical Barrier Assessment Impaired Vision: No Impaired Hearing: No Decreased Hand dexterity: No Knowledge/Comprehension Assessment Knowledge Level: Medium Comprehension Level: Medium Ability to understand written Medium instructions: Ability to understand verbal Medium instructions: Motivation Assessment Anxiety Level: Calm Cooperation: Cooperative Education Importance: Acknowledges Need Interest in Health Problems: Asks Questions Perception: Coherent Willingness to Engage in Self- Medium Management Activities: Readiness to Engage in Self- Medium Management Activities: Electronic Signature(s) Signed: 09/09/2016 4:34:10 PM By: Elpidio EricAfful, Rita BSN, RN Entered By: Elpidio EricAfful, Rita on 09/09/2016 14:49:15 Mahurin, Victorina ([id number]) HamiltonMCTILLMAN, [name] [name] ([id number]) -------------------------------------------------------------------------------- Fall Risk Assessment Details Patient Name: [name] [name],  [name] [name] Date of Service: 09/09/2016 3:00 PM Medical Record Patient Account Number: [id number] Number: Afful, RN, BSN, Treating RN: 11-12-1934 (82 y.o. Grandfather [place] Date of Birth/Sex: Female) Other Clinician: Primary Care Physician: Barbette ReichmannHande, Vishwanath Treating Britto, Errol Referring Physician: Ricarda FrameWOODHAM, CHARLES Physician/Extender: Weeks in Treatment: 0 Fall Risk Assessment Items Have you had 2 or more falls in the last 12 monthso 0 No Have you had any fall that resulted in injury in the last 12 monthso 0 No FALL RISK ASSESSMENT: History of falling - immediate or within 3 months 0 No Secondary diagnosis 0 No Ambulatory aid None/bed rest/wheelchair/nurse 0 Yes Crutches/cane/walker 0 No Furniture 0 No IV Access/Saline Lock 0 No Gait/Training Normal/bed rest/immobile 0 Yes  Weak 10 Yes Impaired 20 Yes Mental Status Oriented to own ability 0 Yes Electronic Signature(s) Signed: 09/09/2016 4:34:10 PM By: Elpidio Eric BSN, RN Entered By: Elpidio Eric on 09/09/2016 14:49:38 [place], Aloha ([id number]) -------------------------------------------------------------------------------- Foot Assessment Details Patient Name: [name] [name] Date of Service: 09/09/2016 3:00 PM Medical Record Patient Account Number: [id number] Number: Afful, RN, BSN, Treating RN: 22-Mar-1934 (82 y.o. [place] Date of Birth/Sex: Female) Other Clinician: Primary Care Physician: Barbette Reichmann Treating Britto, Errol Referring Physician: Ricarda Frame Physician/Extender: Weeks in Treatment: 0 Foot Assessment Items Site Locations + = Sensation present, - = Sensation absent, C = Callus, U = Ulcer R = Redness, W = Warmth, M = Maceration, PU = Pre-ulcerative lesion F = Fissure, S = Swelling, D = Dryness Assessment Right: Left: Other Deformity: No No Prior Foot Ulcer: No No Prior Amputation: No No Charcot Joint: No No Ambulatory Status: Ambulatory With Help Assistance Device: Wheelchair Gait:  Surveyor, mining) Signed: 09/09/2016 4:34:10 PM By: Elpidio Eric BSN, RN Entered By: Elpidio Eric on 09/09/2016 14:49:57 Kulesza, Salinda ([id number]) [facility], Namiyah ([id number]) -------------------------------------------------------------------------------- Nutrition Risk Assessment Details Patient Name: [name] [name] Date of Service: 09/09/2016 3:00 PM Medical Record Patient Account Number: [id number] Number: Afful, RN, BSN, Treating RN: 31-Jul-1934 (82 y.o. [place] Date of Birth/Sex: Female) Other Clinician: Primary Care Physician: Barbette Reichmann Treating Britto, Errol Referring Physician: Ricarda Frame Physician/Extender: Weeks in Treatment: 0 Height (in): 62 Weight (lbs): 165 Body Mass Index (BMI): 30.2 Nutrition Risk Assessment Items NUTRITION RISK SCREEN: I have an illness or condition that made me change the kind and/or 0 No amount of food I eat I eat fewer than two meals per day 0 No I eat few fruits and vegetables, or milk products 0 No I have three or more drinks of beer, liquor or wine almost every day 0 No I have tooth or mouth problems that make it hard for me to eat 0 No I don't always have enough money to buy the food I need 0 No I eat alone most of the time 0 No I take three or more different prescribed or over-the-counter drugs a 0 No day Without wanting to, I have lost or gained 10 pounds in the last six 0 No months I am not always physically able to shop, cook and/or feed myself 0 No Nutrition Protocols Good Risk Protocol 0 No interventions needed Moderate Risk Protocol Electronic Signature(s) Signed: 09/09/2016 4:34:10 PM By: Elpidio Eric BSN, RN Entered By: Elpidio Eric on 09/09/2016 14:49:44

## 2016-09-10 NOTE — Progress Notes (Addendum)
[name] [name] ([id number]) Visit Report for 09/09/2016 Allergy List Details Patient Name: [name] [name] Date of Service: 09/09/2016 3:00 PM Medical Record Number: [id number] Patient Account Number: [id number] Date of Birth/Sex: [birth year] (82 y.o. Female) Treating RN: Clover Mealy, RN, BSN, [place] Sink Primary Care Physician: Barbette Reichmann Other Clinician: Referring Physician: Ricarda Frame Treating Physician/Extender: Rudene Re in Treatment: 0 Allergies Active Allergies No known Allergies Allergy Notes Electronic Signature(s) Signed: 09/09/2016 4:34:10 PM By: Elpidio Eric BSN, RN Entered By: Elpidio Eric on 09/09/2016 14:59:39 [name] [name] ([id number]) -------------------------------------------------------------------------------- Arrival Information Details Patient Name: [name] [name] Date of Service: 09/09/2016 3:00 PM Medical Record Number: [id number] Patient Account Number: [id number] Date of Birth/Sex: 07-07-1934 (82 y.o. Female) Treating RN: Clover Mealy, RN, BSN, [place] Sink Primary Care Physician: Barbette Reichmann Other Clinician: Referring Physician: Ricarda Frame Treating Physician/Extender: Rudene Re in Treatment: 0 Visit Information Patient Arrived: Wheel Chair Arrival Time: 14:38 Accompanied By: caregiver Transfer Assistance: EasyPivot Patient Lift Patient Identification Verified: Yes Secondary Verification Process Yes Completed: Patient Requires Transmission- No Based Precautions: Patient Has Alerts: No Electronic Signature(s) Signed: 09/09/2016 4:34:10 PM By: Elpidio Eric BSN, RN Entered By: Elpidio Eric on 09/09/2016 14:41:36 [name] [name] ([id number]) -------------------------------------------------------------------------------- Clinic Level of Care Assessment Details Patient Name: [name] [name] Date of Service: 09/09/2016 3:00 PM Medical Record Number: [id number] Patient Account Number: [id number] Date of Birth/Sex: 01/01/1934 (82  y.o. Female) Treating RN: Afful, RN, BSN, [place] Sink Primary Care Physician: Barbette Reichmann Other Clinician: Referring Physician: Ricarda Frame Treating Physician/Extender: Rudene Re in Treatment: 0 Clinic Level of Care Assessment Items TOOL 1 Quantity Score []  - Use when EandM and Procedure is performed on INITIAL visit 0 ASSESSMENTS - Nursing Assessment / Reassessment X - General Physical Exam (combine w/ comprehensive assessment (listed just 1 20 below) when performed on new pt. evals) X - Comprehensive Assessment (HX, ROS, Risk Assessments, Wounds Hx, etc.) 1 25 ASSESSMENTS - Wound and Skin Assessment / Reassessment []  - Dermatologic / Skin Assessment (not related to wound area) 0 ASSESSMENTS - Ostomy and/or Continence Assessment and Care []  - Incontinence Assessment and Management 0 []  - Ostomy Care Assessment and Management (repouching, etc.) 0 PROCESS - Coordination of Care X - Simple Patient / Family Education for ongoing care 1 15 []  - Complex (extensive) Patient / Family Education for ongoing care 0 X - Staff obtains Chiropractor, Records, Test Results / Process Orders 1 10 X - Staff telephones HHA, Nursing Homes / Clarify orders / etc 1 10 []  - Routine Transfer to another Facility (non-emergent condition) 0 []  - Routine Hospital Admission (non-emergent condition) 0 X - New Admissions / Manufacturing engineer / Ordering NPWT, Apligraf, etc. 1 15 []  - Emergency Hospital Admission (emergent condition) 0 PROCESS - Special Needs []  - Pediatric / Minor Patient Management 0 []  - Isolation Patient Management 0 [name] [name] ([id number]) []  - Hearing / Language / Visual special needs 0 []  - Assessment of Community assistance (transportation, D/C planning, etc.) 0 []  - Additional assistance / Altered mentation 0 []  - Support Surface(s) Assessment (bed, cushion, seat, etc.) 0 INTERVENTIONS - Miscellaneous []  - External ear exam 0 X - Patient Transfer (multiple staff  / Nurse, adult / Similar devices) 1 10 []  - Simple Staple / Suture removal (25 or less) 0 []  - Complex Staple / Suture removal (26 or more) 0 []  - Hypo/Hyperglycemic Management (do not check if billed separately) 0 []  - Ankle / Brachial Index (ABI) - do not check if billed separately 0 Has the patient been seen at the  hospital within the last three years: Yes Total Score: 105 Level Of Care: New/Established - Level 3 Electronic Signature(s) Signed: 09/09/2016 4:34:10 PM By: Elpidio Eric BSN, RN Entered By: Elpidio Eric on 09/09/2016 15:13:06 [name] [name] ([id number]) -------------------------------------------------------------------------------- Encounter Discharge Information Details Patient Name: [name] [name] Date of Service: 09/09/2016 3:00 PM Medical Record Number: [id number] Patient Account Number: [id number] Date of Birth/Sex: 11-03-1934 (82 y.o. Female) Treating RN: Clover Mealy, RN, BSN, [place] Sink Primary Care Physician: Barbette Reichmann Other Clinician: Referring Physician: Ricarda Frame Treating Physician/Extender: Rudene Re in Treatment: 0 Encounter Discharge Information Items Discharge Pain Level: 0 Discharge Condition: Stable Ambulatory Status: Wheelchair Discharge Destination: Nursing Home Transportation: Other Accompanied By: caregiver/transport Schedule Follow-up Appointment: No Medication Reconciliation completed and provided to No Patient/Care Ludwika Rodd: Provided on Clinical Summary of Care: 09/09/2016 Form Type Recipient Paper Patient EM Electronic Signature(s) Signed: 09/09/2016 4:34:10 PM By: Elpidio Eric BSN, RN Previous Signature: 09/09/2016 3:12:43 PM Version By: Gwenlyn Perking Entered By: Elpidio Eric on 09/09/2016 15:16:39 [name] [name] ([id number]) -------------------------------------------------------------------------------- Lower Extremity Assessment Details Patient Name: [name] [name] Date of Service: 09/09/2016 3:00 PM Medical Record  Number: [id number] Patient Account Number: [id number] Date of Birth/Sex: 01-18-1934 (82 y.o. Female) Treating RN: Clover Mealy, RN, BSN, [place] Sink Primary Care Physician: Barbette Reichmann Other Clinician: Referring Physician: Ricarda Frame Treating Physician/Extender: Rudene Re in Treatment: 0 Electronic Signature(s) Signed: 09/09/2016 4:34:10 PM By: Elpidio Eric BSN, RN Entered By: Elpidio Eric on 09/09/2016 14:45:59 [name] [name] ([id number]) -------------------------------------------------------------------------------- Multi Wound Chart Details Patient Name: [name] [name] Date of Service: 09/09/2016 3:00 PM Medical Record Number: [id number] Patient Account Number: [id number] Date of Birth/Sex: 1934/03/15 (82 y.o. Female) Treating RN: Clover Mealy, RN, BSN, [place] Sink Primary Care Physician: Barbette Reichmann Other Clinician: Referring Physician: Ricarda Frame Treating Physician/Extender: Rudene Re in Treatment: 0 Vital Signs Height(in): 62 Pulse(bpm): 91 Weight(lbs): 165 Blood Pressure 152/76 (mmHg): Body Mass Index(BMI): 30 Temperature(F): 98.3 Respiratory Rate 18 (breaths/min): Photos: [1:No Photos] [N/A:N/A] Wound Location: [1:Right Abdomen - Lower Quadrant - Medial] [N/A:N/A] Wounding Event: [1:Gradually Appeared] [N/A:N/A] Primary Etiology: [1:Trauma, Other] [N/A:N/A] Date Acquired: [1:08/02/2016] [N/A:N/A] Weeks of Treatment: [1:0] [N/A:N/A] Wound Status: [1:Open] [N/A:N/A] Measurements L x W x D 2.2x3.8x0.2 [N/A:N/A] (cm) Area (cm) : [1:6.566] [N/A:N/A] Volume (cm) : [1:1.313] [N/A:N/A] % Reduction in Area: [1:0.00%] [N/A:N/A] % Reduction in Volume: 0.00% [N/A:N/A] Classification: [1:Full Thickness Without Exposed Support Structures] [N/A:N/A] Exudate Amount: [1:Large] [N/A:N/A] Exudate Type: [1:Serosanguineous] [N/A:N/A] Exudate Color: [1:red, brown] [N/A:N/A] Wound Margin: [1:Distinct, outline attached] [N/A:N/A] Granulation Amount: [1:Small  (1-33%)] [N/A:N/A] Granulation Quality: [1:Pale] [N/A:N/A] Necrotic Amount: [1:Large (67-100%)] [N/A:N/A] Necrotic Tissue: [1:Eschar, Adherent Slough] [N/A:N/A] Exposed Structures: [1:Fascia: No Fat: No Tendon: No Muscle: No Joint: No] [N/A:N/A] Bone: No Limited to Skin Breakdown Epithelialization: None N/A N/A Periwound Skin Texture: Edema: No N/A N/A Excoriation: No Induration: No Callus: No Crepitus: No Fluctuance: No Friable: No Rash: No Scarring: No Periwound Skin Moist: Yes N/A N/A Moisture: Maceration: No Dry/Scaly: No Periwound Skin Color: Atrophie Blanche: No N/A N/A Cyanosis: No Ecchymosis: No Erythema: No Hemosiderin Staining: No Mottled: No Pallor: No Rubor: No Temperature: No Abnormality N/A N/A Tenderness on No N/A N/A Palpation: Wound Preparation: Ulcer Cleansing: N/A N/A Rinsed/Irrigated with Saline Topical Anesthetic Applied: Other: lidocaine 4% Treatment Notes Electronic Signature(s) Signed: 09/09/2016 4:34:10 PM By: Elpidio Eric BSN, RN Entered By: Elpidio Eric on 09/09/2016 15:00:58 [name] [name] ([id number]) -------------------------------------------------------------------------------- Multi-Disciplinary Care Plan Details Patient Name: [name] [name] Date of Service: 09/09/2016 3:00 PM Medical Record Number: [id number] Patient Account Number: [id number] Date of Birth/Sex: 24-Feb-1934 (82 y.o.  Female) Treating RN: Clover Mealy, RN, BSN, [place] Sink Primary Care Physician: Barbette Reichmann Other Clinician: Referring Physician: Ricarda Frame Treating Physician/Extender: Rudene Re in Treatment: 0 Active Inactive Electronic Signature(s) Signed: 10/19/2016 1:42:52 PM By: Elliot Gurney RN, BSN, Kim RN, BSN Signed: 12/16/2016 5:33:36 PM By: Elpidio Eric BSN, RN Previous Signature: 09/09/2016 4:34:10 PM Version By: Elpidio Eric BSN, RN Entered By: Elliot Gurney, RN, BSN, Kim on 10/19/2016 13:42:51 [name] [name]  ([id number]) -------------------------------------------------------------------------------- Pain Assessment Details Patient Name: [name] [name] Date of Service: 09/09/2016 3:00 PM Medical Record Number: [id number] Patient Account Number: [id number] Date of Birth/Sex: 09/30/1934 (82 y.o. Female) Treating RN: Clover Mealy, RN, BSN, [place] Sink Primary Care Physician: Barbette Reichmann Other Clinician: Referring Physician: Ricarda Frame Treating Physician/Extender: Rudene Re in Treatment: 0 Active Problems Location of Pain Severity and Description of Pain Patient Has Paino No Site Locations With Dressing Change: No Pain Management and Medication Current Pain Management: Electronic Signature(s) Signed: 09/09/2016 4:34:10 PM By: Elpidio Eric BSN, RN Entered By: Elpidio Eric on 09/09/2016 14:46:09 [name] [name] ([id number]) -------------------------------------------------------------------------------- Patient/Caregiver Education Details Patient Name: [name] [name] Date of Service: 09/09/2016 3:00 PM Medical Record Number: [id number] Patient Account Number: [id number] Date of Birth/Gender: 1934-08-05 (82 y.o. Female) Treating RN: Clover Mealy, RN, BSN, [place] Sink Primary Care Physician: Barbette Reichmann Other Clinician: Referring Physician: Ricarda Frame Treating Physician/Extender: Rudene Re in Treatment: 0 Education Assessment Education Provided To: Patient Education Topics Provided Basic Hygiene: Methods: Explain/Verbal Responses: State content correctly Safety: Methods: Explain/Verbal Responses: State content correctly [facility]: Methods: Explain/Verbal Responses: State content correctly Wound/Skin Impairment: Methods: Explain/Verbal Responses: State content correctly Electronic Signature(s) Signed: 09/09/2016 4:34:10 PM By: Elpidio Eric BSN, RN Entered By: Elpidio Eric on 09/09/2016 15:16:59 [name] [name]  ([id number]) -------------------------------------------------------------------------------- Wound Assessment Details Patient Name: [name] [name] Date of Service: 09/09/2016 3:00 PM Medical Record Number: [id number] Patient Account Number: [id number] Date of Birth/Sex: April 04, 1934 (82 y.o. Female) Treating RN: Afful, RN, BSN, [name] Primary Care Physician: Barbette Reichmann Other Clinician: Referring Physician: Ricarda Frame Treating Physician/Extender: Rudene Re in Treatment: 0 Wound Status Wound Number: 1 Primary Etiology: Trauma, Other Wound Location: Right Abdomen - Lower Wound Status: Open Quadrant - Medial Wounding Event: Gradually Appeared Date Acquired: 08/02/2016 Weeks Of Treatment: 0 Clustered Wound: No Photos Photo Uploaded By: Elpidio Eric on 09/09/2016 16:30:53 Wound Measurements Length: (cm) 2.2 Width: (cm) 3.8 Depth: (cm) 0.2 Area: (cm) 6.566 Volume: (cm) 1.313 % Reduction in Area: 0% % Reduction in Volume: 0% Epithelialization: None Tunneling: No Undermining: No Wound Description Full Thickness Without Exposed Classification: Support Structures Wound Margin: Distinct, outline attached Exudate Large Amount: Exudate Type: Serosanguineous Exudate Color: red, brown Wound Bed Granulation Amount: Small (1-33%) Exposed Structure Granulation Quality: Pale Fascia Exposed: No Routt, [name] ([id number]) Necrotic Amount: Large (67-100%) Fat Layer Exposed: No Necrotic Quality: Eschar, Adherent Slough Tendon Exposed: No Muscle Exposed: No Joint Exposed: No Bone Exposed: No Limited to Skin Breakdown Periwound Skin Texture Texture Color No Abnormalities Noted: No No Abnormalities Noted: No Callus: No Atrophie Blanche: No Crepitus: No Cyanosis: No Excoriation: No Ecchymosis: No Fluctuance: No Erythema: No Friable: No Hemosiderin Staining: No Induration: No Mottled: No Localized Edema: No Pallor: No Rash: No Rubor:  No Scarring: No Temperature / Pain Moisture Temperature: No Abnormality No Abnormalities Noted: No Dry / Scaly: No Maceration: No Moist: Yes Wound Preparation Ulcer Cleansing: Rinsed/Irrigated with Saline Topical Anesthetic Applied: Other: lidocaine 4%, Electronic Signature(s) Signed: 09/09/2016 4:34:10 PM By: Elpidio Eric BSN, RN Entered By: Elpidio Eric on 09/09/2016 14:45:39 Sampedro, Karoline (  [id number]) -------------------------------------------------------------------------------- Vitals Details Patient Name: [name] ChickMCTILLMAN, Katlin Date of Service: 09/09/2016 3:00 PM Medical Record Number: [id number] Patient Account Number: [id number] Date of Birth/Sex: 07-31-1934 (82 y.o. Female) Treating RN: Afful, RN, BSN, [name] Primary Care Physician: Barbette ReichmannHande, Vishwanath Other Clinician: Referring Physician: Ricarda FrameWOODHAM, CHARLES Treating Physician/Extender: Rudene ReBritto, Errol Weeks in Treatment: 0 Vital Signs Time Taken: 14:46 Temperature (F): 98.3 Height (in): 62 Pulse (bpm): 91 Source: Stated Respiratory Rate (breaths/min): 18 Weight (lbs): 165 Blood Pressure (mmHg): 152/76 Source: Stated Reference Range: 80 - 120 mg / dl Body Mass Index (BMI): 30.2 Electronic Signature(s) Signed: 09/09/2016 4:34:10 PM By: Elpidio EricAfful, [name] BSN, RN Entered By: Elpidio EricAfful, [name] on 09/09/2016 14:46:58

## 2016-09-10 NOTE — Progress Notes (Addendum)
[name], [name] ([id number]) Visit Report for 09/09/2016 Chief Complaint Document Details Patient Name: [name] [name], [name] [name] Date of Service: 09/09/2016 3:00 PM Medical Record Patient Account Number: [id number] Number: Afful, [name] [name], [name] [name], Treating [name] [name]: [birth year] (82 y.o. [name] [name] Date of Birth/Sex: Female) Other Clinician: Primary Care Physician: Barbette Reichmann Treating [name] [name] Referring Physician: Ricarda Frame Physician/Extender: Weeks in Treatment: 0 Information Obtained from: Patient Chief Complaint Patients presents for treatment of an open diabetic ulcer the abdomen wall in the periumbilical region which she's had since 3 weeks. Electronic Signature(s) Signed: 09/09/2016 3:10:03 PM By: [name] Kanner MD, [name] [name] Entered By: [name] [name] on 09/09/2016 15:10:02 [facility], [place] ([id number]) -------------------------------------------------------------------------------- Debridement Details Patient Name: [name] [name] Date of Service: 09/09/2016 3:00 PM Medical Record Patient Account Number: [id number] Number: Afful, [name] [name], [name] [name], Treating [name] [name]: December 21, 1933 (82 y.o. [name] [name] Date of Birth/Sex: Female) Other Clinician: Primary Care Physician: Barbette Reichmann Treating Trysten Bernard Referring Physician: Ricarda Frame Physician/Extender: Weeks in Treatment: 0 Debridement Performed for Wound #1 Right,Medial Abdomen - Lower Quadrant Assessment: Performed By: Physician [name] Kanner, MD Debridement: Debridement Pre-procedure Yes - 14:57 Verification/Time Out Taken: Start Time: 14:57 Pain Control: Lidocaine 4% Topical Solution Level: Skin/Subcutaneous Tissue Total Area Debrided (L x 2.2 (cm) x 3.8 (cm) = 8.36 (cm) W): Tissue and other Viable, Non-Viable, Eschar, Exudate, Fat, Fibrin/Slough, Subcutaneous material debrided: Instrument: Blade, Forceps Bleeding: Minimum Hemostasis Achieved: Pressure End Time: 15:02 Procedural Pain: 0 Post Procedural Pain:  0 Response to Treatment: Procedure was tolerated well Post Debridement Measurements of Total Wound Length: (cm) 2.2 Width: (cm) 3.8 Depth: (cm) 0.5 Volume: (cm) 3.283 Character of Wound/Ulcer Post Requires Further Debridement Debridement: Severity of Tissue Post Debridement: Fat layer exposed Post Procedure Diagnosis Same as Pre-procedure Electronic Signature(s) Signed: 09/09/2016 3:09:19 PM By: [name] Kanner MD, [name] [name] Signed: 09/09/2016 4:34:10 PM By: Elpidio Eric BSN, [name] [name] [place], Laural ([id number]) Entered By: [name] [name] on 09/09/2016 15:09:19 Vachon, Ndia ([id number]) -------------------------------------------------------------------------------- HPI Details Patient Name: [name] [name] Date of Service: 09/09/2016 3:00 PM Medical Record Patient Account Number: [id number] Number: Afful, [name] [name], [name] [name], Treating [name] [name]: 11/15/1934 (82 y.o. [name] [name] Date of Birth/Sex: Female) Other Clinician: Primary Care Physician: Barbette Reichmann Treating [name] [name] Referring Physician: Ricarda Frame Physician/Extender: Weeks in Treatment: 0 History of Present Illness Location: abdomen wall below her mucus Quality: Patient reports experiencing a dull pain to affected area(s). Severity: Patient states wound are getting worse. Duration: Patient has had the wound for < 3 weeks prior to presenting for treatment Timing: Pain in wound is Intermittent (comes and goes Context: The wound occurred when the patient an abscess in that area and was treated in hospital for this Modifying Factors: Other treatment(s) tried include:ID in the ER and IV antibiotics and supportive care Associated Signs and Symptoms: Patient reports having increase discharge. HPI Description: 82 year old female who was recently admitted to the hospital between 08/21/2016 and 08/25/2016, after an incision and drainage of an abdominal abscess. Her past medical history significant for diabetes mellitus,  hypertension and stroke. The abscess happened at the site of insulin injection and grew Proteus and probably dementia. She was treated with IV vancomycin and Zosyn while she was in hospital and changed over to Augmentin at the time of discharge. palmar smoker and her last hemoglobin A1c was 9.1% Electronic Signature(s) Signed: 09/09/2016 3:11:21 PM By: [name] Kanner MD, [name] [name] Previous Signature: 09/09/2016 2:55:24 PM Version By: [name] Kanner MD, [name] [name] Previous Signature: 09/09/2016 2:54:12 PM Version By: [name] Kanner MD, [name] [name] Entered By: [name] [name] on 09/09/2016 15:11:21 Schloesser, Saida ([id number]) --------------------------------------------------------------------------------  Physical Exam Details Patient Name: [name] ChickMCTILLMAN, Markeda Date of Service: 09/09/2016 3:00 PM Medical Record Patient Account Number: [id number] Number: Afful, [name] [name], [name] [name], Treating [name] [name]: [birth year] (82 y.o. [name] [name] [name] Date of Birth/Sex: Female) Other Clinician: Primary Care Physician: Barbette ReichmannHande, Vishwanath Treating [name] [name] Referring Physician: Ricarda FrameWOODHAM, CHARLES Physician/Extender: Weeks in Treatment: 0 Constitutional . Pulse regular. Respirations normal and unlabored. Afebrile. . Eyes Nonicteric. Reactive to light. Ears, Nose, Mouth, and Throat Lips, teeth, and gums WNL.Marland Kitchen. Moist mucosa without lesions. Neck supple and nontender. No palpable supraclavicular or cervical adenopathy. Normal sized without goiter. Respiratory WNL. No retractions.. Cardiovascular Pedal Pulses WNL. No clubbing, cyanosis or edema. Gastrointestinal (GI) Abdomen without masses or tenderness.. No liver or spleen enlargement or tenderness.. Lymphatic No adneopathy. No adenopathy. No adenopathy. Musculoskeletal Adexa without tenderness or enlargement.. Digits and nails w/o clubbing, cyanosis, infection, petechiae, ischemia, or inflammatory conditions.. Integumentary (Hair, Skin) No suspicious lesions. No crepitus or fluctuance. No  peri-wound warmth or erythema. No masses.Marland Kitchen. Psychiatric Judgement and insight Intact.. No evidence of depression, anxiety, or agitation.. Notes ulcerated area in the infraumbilical region with a lot of necrotic debris down to subcutaneous tissue and this was sharply debrided with a forcep and 50 number blade down to healthy granulation tissue. All bleeding controlled with pressure. Electronic Signature(s) Signed: 09/09/2016 3:12:07 PM By: [name] KannerBritto, Pacey Altizer MD, [name] [name] Entered By: [name] KannerBritto, Rheta Hemmelgarn on 09/09/2016 15:12:07 Gatchell, Jameson ([id number]) -------------------------------------------------------------------------------- Physician Orders Details Patient Name: [name] ChickMCTILLMAN, Breeonna Date of Service: 09/09/2016 3:00 PM Medical Record Patient Account Number: [id number] Number: Afful, [name] [name], [name] [name], Treating [name] [name]: [birth year] (82 y.o. [place] [name] [name] Date of Birth/Sex: Female) Other Clinician: Primary Care Physician: Marcello FennelHande, Vishwanath Treating [name] [name] Referring Physician: Ricarda FrameWOODHAM, CHARLES Physician/Extender: Tania AdeWeeks in Treatment: 0 Verbal / Phone Orders: Yes Clinician: Afful, [name] [name], [name] [name], Rita Read Back and Verified: Yes Diagnosis Coding Wound Cleansing Wound #1 Right,Medial Abdomen - Lower Quadrant o Cleanse wound with mild soap and water o May Shower, gently pat wound dry prior to applying new dressing. Anesthetic Wound #1 Right,Medial Abdomen - Lower Quadrant o Topical Lidocaine 4% cream applied to wound bed prior to debridement Skin Barriers/Peri-Wound Care Wound #1 Right,Medial Abdomen - Lower Quadrant o Skin Prep Primary Wound Dressing Wound #1 Right,Medial Abdomen - Lower Quadrant o Aquacel Ag Secondary Dressing Wound #1 Right,Medial Abdomen - Lower Quadrant o Dry Gauze o Boardered Foam Dressing Dressing Change Frequency Wound #1 Right,Medial Abdomen - Lower Quadrant o Change dressing every day. Follow-up Appointments Wound #1 Right,Medial Abdomen - Lower Quadrant o  Return Appointment in 1 week. Additional Orders / Instructions Wound #1 Right,Medial Abdomen - Lower Quadrant o Increase protein intake. [facility] ([id number]) o Activity as tolerated o Other: - Zinc, MVI, Vit C,A Electronic Signature(s) Signed: 09/09/2016 3:52:17 PM By: [name] KannerBritto, Benjamine Strout MD, [name] [name] Signed: 09/09/2016 4:34:10 PM By: Elpidio EricAfful, Rita BSN, [name] [name] Entered By: Elpidio EricAfful, Rita on 09/09/2016 15:12:10 Rachels, Mariza ([id number]) -------------------------------------------------------------------------------- Problem List Details Patient Name: [name] ChickMCTILLMAN, Jolonda Date of Service: 09/09/2016 3:00 PM Medical Record Patient Account Number: [id number] Number: Afful, [name] [name], [name] [name], Treating [name] [name]: [birth year] (82 y.o. [place] [name] [name] Date of Birth/Sex: Female) Other Clinician: Primary Care Physician: Barbette ReichmannHande, Vishwanath Treating [name] KannerBritto, [name] [name] Referring Physician: Ricarda FrameWOODHAM, CHARLES Physician/Extender: Weeks in Treatment: 0 Active Problems ICD-10 Encounter Code Description Active Date Diagnosis E11.622 Type 2 diabetes mellitus with other skin ulcer 09/09/2016 Yes S31.105A Unspecified open wound of abdominal wall, periumbilic 09/09/2016 Yes region without penetration into peritoneal cavity, initial encounter L02.211 Cutaneous abscess of abdominal wall 09/09/2016 Yes Inactive Problems Resolved Problems Electronic Signature(s) Signed: 09/09/2016 3:08:46 PM  By: [name] Kanner MD, [name] [name] Entered By: [name] [name] on 09/09/2016 15:08:46 [place], Shallen ([id number]) -------------------------------------------------------------------------------- Progress Note Details Patient Name: [name] [name] Date of Service: 09/09/2016 3:00 PM Medical Record Patient Account Number: [id number] Number: Afful, [name] [name], [name] [name], Treating [name] [name]: December 06, 1934 (82 y.o. [place] [name] Date of Birth/Sex: Female) Other Clinician: Primary Care Physician: Barbette Reichmann Treating [name] [name] Referring Physician: Ricarda Frame Physician/Extender: Weeks in Treatment: 0 Subjective Chief Complaint Information obtained from Patient Patients presents for treatment of an open diabetic ulcer the abdomen wall in the periumbilical region which she's had since 3 weeks. History of Present Illness (HPI) The following HPI elements were documented for the patient's wound: Location: abdomen wall below her mucus Quality: Patient reports experiencing a dull pain to affected area(s). Severity: Patient states wound are getting worse. Duration: Patient has had the wound for < 3 weeks prior to presenting for treatment Timing: Pain in wound is Intermittent (comes and goes Context: The wound occurred when the patient an abscess in that area and was treated in hospital for this Modifying Factors: Other treatment(s) tried include:ID in the ER and IV antibiotics and supportive care Associated Signs and Symptoms: Patient reports having increase discharge. 82 year old female who was recently admitted to the hospital between 08/21/2016 and 08/25/2016, after an incision and drainage of an abdominal abscess. Her past medical history significant for diabetes mellitus, hypertension and stroke. The abscess happened at the site of insulin injection and grew Proteus and probably dementia. She was treated with IV vancomycin and Zosyn while she was in hospital and changed over to Augmentin at the time of discharge. palmar smoker and her last hemoglobin A1c was 9.1% Wound History Patient presents with 1 open wound that has been present for approximately 1months. Patient has been treating wound in the following manner: silver alginate. Laboratory tests have not been performed in the last month. Patient reportedly has not tested positive for an antibiotic resistant organism. Patient reportedly has not tested positive for osteomyelitis. Patient reportedly has not had testing performed to evaluate circulation in the legs. Patient experiences  the following problems associated with their wounds: infection. Patient History Information obtained from Patient, Chart. Allergies No known Allergies [place], Baylyn ([id number]) Family History Diabetes - Siblings, Hypertension - Paternal Grandparents, Father, No family history of Cancer, Heart Disease, Hereditary Spherocytosis, Kidney Disease, Lung Disease, Seizures, Stroke, Thyroid Problems, Tuberculosis. Social History Never smoker, Marital Status - Widowed, Alcohol Use - Never, Drug Use - No History, Caffeine Use - Moderate. Medical History Eyes Patient has history of Cataracts Ear/Nose/Mouth/Throat Denies history of Chronic sinus problems/congestion, Middle ear problems Hematologic/Lymphatic Denies history of Anemia, Hemophilia, Human Immunodeficiency Virus, Lymphedema, Sickle Cell Disease Respiratory Denies history of Aspiration, Asthma, Pneumothorax, Sleep Apnea, Tuberculosis Cardiovascular Patient has history of Arrhythmia, Hypertension, Peripheral Venous Disease Denies history of Angina, Hypotension Gastrointestinal Denies history of Cirrhosis , Colitis, Crohn s, Hepatitis A, Hepatitis B, Hepatitis C Endocrine Patient has history of Type II Diabetes Genitourinary Denies history of End Stage Renal Disease Immunological Denies history of Lupus Erythematosus, Raynaud s, Scleroderma Integumentary (Skin) Denies history of History of Burn, History of pressure wounds Musculoskeletal Patient has history of Osteoarthritis Neurologic Patient has history of Neuropathy - idiopathic peripheral autonomic Oncologic Denies history of Received Chemotherapy, Received Radiation [name] treated with Insulin, Oral Agents. Blood sugar is not tested. Blood sugar results noted at the following times: Breakfast - 156. Medical And Surgical History Notes Ear/Nose/Mouth/Throat Dysphasia Cardiovascular Vitamin D deficiency, hypokalemia Musculoskeletal Difficulty in  walking [facility], Nayelis (  [id number]) Review of Systems (ROS) Constitutional Symptoms (General Health) The patient has no complaints or symptoms. Eyes Complains or has symptoms of Glasses / Contacts. Ear/Nose/Mouth/Throat The patient has no complaints or symptoms. Hematologic/Lymphatic The patient has no complaints or symptoms. Respiratory The patient has no complaints or symptoms. Cardiovascular The patient has no complaints or symptoms. Gastrointestinal The patient has no complaints or symptoms. Endocrine The patient has no complaints or symptoms. Genitourinary The patient has no complaints or symptoms. Immunological The patient has no complaints or symptoms. Integumentary (Skin) Complains or has symptoms of Wounds, Breakdown. Musculoskeletal Complains or has symptoms of Muscle Weakness. Neurologic The patient has no complaints or symptoms. Oncologic The patient has no complaints or symptoms. Psychiatric Complains or has symptoms of Anxiety. Objective Constitutional Pulse regular. Respirations normal and unlabored. Afebrile. Vitals Time Taken: 2:46 PM, Height: 62 in, Source: Stated, Weight: 165 lbs, Source: Stated, BMI: 30.2, Temperature: 98.3 F, Pulse: 91 bpm, Respiratory Rate: 18 breaths/min, Blood Pressure: 152/76 mmHg. Eyes Nonicteric. Reactive to light. Ears, Nose, Mouth, and Throat Eichler, Karron ([id number]) Lips, teeth, and gums WNL.Marland Kitchen Moist mucosa without lesions. Neck supple and nontender. No palpable supraclavicular or cervical adenopathy. Normal sized without goiter. Respiratory WNL. No retractions.. Cardiovascular Pedal Pulses WNL. No clubbing, cyanosis or edema. Gastrointestinal (GI) Abdomen without masses or tenderness.. No liver or spleen enlargement or tenderness.. Lymphatic No adneopathy. No adenopathy. No adenopathy. Musculoskeletal Adexa without tenderness or enlargement.. Digits and nails w/o clubbing, cyanosis, infection,  petechiae, ischemia, or inflammatory conditions.Marland Kitchen Psychiatric Judgement and insight Intact.. No evidence of depression, anxiety, or agitation.. General Notes: ulcerated area in the infraumbilical region with a lot of necrotic debris down to subcutaneous tissue and this was sharply debrided with a forcep and 50 number blade down to healthy granulation tissue. All bleeding controlled with pressure. Integumentary (Hair, Skin) No suspicious lesions. No crepitus or fluctuance. No peri-wound warmth or erythema. No masses.. Wound #1 status is Open. Original cause of wound was Gradually Appeared. The wound is located on the Right,Medial Abdomen - Lower Quadrant. The wound measures 2.2cm length x 3.8cm width x 0.2cm depth; 6.566cm^2 area and 1.313cm^3 volume. The wound is limited to skin breakdown. There is no tunneling or undermining noted. There is a large amount of serosanguineous drainage noted. The wound margin is distinct with the outline attached to the wound base. There is small (1-33%) pale granulation within the wound bed. There is a large (67-100%) amount of necrotic tissue within the wound bed including Eschar and Adherent Slough. The periwound skin appearance exhibited: Moist. The periwound skin appearance did not exhibit: Callus, Crepitus, Excoriation, Fluctuance, Friable, Induration, Localized Edema, Rash, Scarring, Dry/Scaly, Maceration, Atrophie Blanche, Cyanosis, Ecchymosis, Hemosiderin Staining, Mottled, Pallor, Rubor, Erythema. Periwound temperature was noted as No Abnormality. Assessment Active Problems [name] [name], [name] [name] ([id number]) ICD-10 E11.622 - Type 2 diabetes mellitus with other skin ulcer S31.105A - Unspecified open wound of abdominal wall, periumbilic region without penetration into peritoneal cavity, initial encounter L02.211 - Cutaneous abscess of abdominal wall 82 year old patient with recent incision and drainage of an abdominal wall abscess with diabetes mellitus  is here for an opinion regarding further wound management. I have recommended: 1. repacking of the wound with silver alginate and a bordered foam. 2. Good control of her diabetes mellitus 3. Adequate protein, vitamin A, vitamin C and zinc. 4. review on a regular basis at the wound center Procedures Wound #1 Wound #1 is a Trauma, Other located on the Right,Medial Abdomen - Lower Quadrant . There was a  Skin/Subcutaneous Tissue Debridement ([id number]) debridement with total area of 8.36 sq cm performed by [name] Kanner, MD. with the following instrument(s): Blade and Forceps to remove Viable and Non-Viable tissue/material including Exudate, Fat, Fibrin/Slough, Eschar, and Subcutaneous after achieving pain control using Lidocaine 4% Topical Solution. A time out was conducted at 14:57, prior to the start of the procedure. A Minimum amount of bleeding was controlled with Pressure. The procedure was tolerated well with a pain level of 0 throughout and a pain level of 0 following the procedure. Post Debridement Measurements: 2.2cm length x 3.8cm width x 0.5cm depth; 3.283cm^3 volume. Character of Wound/Ulcer Post Debridement requires further debridement. Severity of Tissue Post Debridement is: Fat layer exposed. Post procedure Diagnosis Wound #1: Same as Pre-Procedure Plan 82 year old patient with recent incision and drainage of an abdominal wall abscess with diabetes mellitus is here for an opinion regarding further wound management. I have recommended: 1. repacking of the wound with silver alginate and a bordered foam. Bicking, Marilin ([id number]) 2. Good control of her diabetes mellitus 3. Adequate protein, vitamin A, vitamin C and zinc. 4. review on a regular basis at the wound center Electronic Signature(s) Signed: 09/12/2016 3:07:31 PM By: [name] Kanner MD, [name] [name] Previous Signature: 09/09/2016 3:14:05 PM Version By: [name] Kanner MD, [name] [name] Entered By: [name] [name] on 09/12/2016  15:07:31 Forsberg, Onita ([id number]) -------------------------------------------------------------------------------- ROS/PFSH Details Patient Name: [name] [name] Date of Service: 09/09/2016 3:00 PM Medical Record Patient Account Number: [id number] Number: Afful, [name] [name], [name] [name], Treating [name] [name]: 1934/01/08 (82 y.o. [place] [name] Date of Birth/Sex: Female) Other Clinician: Primary Care Physician: Barbette Reichmann Treating Alahna Dunne Referring Physician: Ricarda Frame Physician/Extender: Weeks in Treatment: 0 Information Obtained From Patient Chart Wound History Do you currently have one or more open woundso Yes How many open wounds do you currently haveo 1 Approximately how long have you had your woundso 1months How have you been treating your wound(s) until nowo silver alginate Has your wound(s) ever healed and then re-openedo No Have you had any lab work done in the past montho No Have you tested positive for an antibiotic resistant organism (MRSA, VRE)o No Have you tested positive for osteomyelitis (bone infection)o No Have you had any tests for circulation on your legso No Have you had other problems associated with your woundso Infection Eyes Complaints and Symptoms: Positive for: Glasses / Contacts Medical History: Positive for: Cataracts Integumentary (Skin) Complaints and Symptoms: Positive for: Wounds; Breakdown Medical History: Negative for: History of Burn; History of pressure wounds Musculoskeletal Complaints and Symptoms: Positive for: Muscle Weakness Medical History: Positive for: Osteoarthritis Past Medical History Notes: Difficulty in walking [facility], [place] ([id number]) Psychiatric Complaints and Symptoms: Positive for: Anxiety Constitutional Symptoms (General Health) Complaints and Symptoms: No Complaints or Symptoms Ear/Nose/Mouth/Throat Complaints and Symptoms: No Complaints or Symptoms Medical History: Negative for: Chronic sinus  problems/congestion; Middle ear problems Past Medical History Notes: Dysphasia Hematologic/Lymphatic Complaints and Symptoms: No Complaints or Symptoms Medical History: Negative for: Anemia; Hemophilia; Human Immunodeficiency Virus; Lymphedema; Sickle Cell Disease Respiratory Complaints and Symptoms: No Complaints or Symptoms Medical History: Negative for: Aspiration; Asthma; Pneumothorax; Sleep Apnea; Tuberculosis Cardiovascular Complaints and Symptoms: No Complaints or Symptoms Medical History: Positive for: Arrhythmia; Hypertension; Peripheral Venous Disease Negative for: Angina; Hypotension Past Medical History Notes: Vitamin D deficiency, hypokalemia Gastrointestinal Complaints and Symptoms: No Complaints or Symptoms Medical HistoryClaressa Hughley, Islah ([id number]) Negative for: Cirrhosis ; Colitis; Crohnos; Hepatitis A; Hepatitis B; Hepatitis C Endocrine Complaints and Symptoms: No Complaints or Symptoms Medical History: Positive for: Type II Diabetes Time  with diabetes: years Treated with: Insulin, Oral agents Blood sugar tested every day: No Blood sugar testing results: Breakfast: 156 Genitourinary Complaints and Symptoms: No Complaints or Symptoms Medical History: Negative for: End Stage Renal Disease Immunological Complaints and Symptoms: No Complaints or Symptoms Medical History: Negative for: Lupus Erythematosus; Raynaudos; Scleroderma Neurologic Complaints and Symptoms: No Complaints or Symptoms Medical History: Positive for: Neuropathy - idiopathic peripheral autonomic Oncologic Complaints and Symptoms: No Complaints or Symptoms Medical History: Negative for: Received Chemotherapy; Received Radiation HBO Extended History Items Eyes: Cataracts Vanpatten, Georgetta ([id number]) Family and Social History Cancer: No; Diabetes: Yes - Siblings; Heart Disease: No; Hereditary Spherocytosis: No; Hypertension: Yes - Paternal Grandparents, Father; Kidney  Disease: No; Lung Disease: No; Seizures: No; Stroke: No; Thyroid Problems: No; Tuberculosis: No; Never smoker; Marital Status - Widowed; Alcohol Use: Never; Drug Use: No History; Caffeine Use: Moderate; Financial Concerns: No; Food, Clothing or Shelter Needs: No; Support System Lacking: No; Transportation Concerns: No; Advanced Directives: No; Patient does not want information on Advanced Directives; Living Will: No Physician Affirmation I have reviewed and agree with the above information. Electronic Signature(s) Signed: 09/09/2016 4:34:10 PM By: Elpidio Eric BSN, [name] [name] Signed: 09/09/2016 4:37:55 PM By: [name] Kanner MD, [name] [name] Previous Signature: 09/09/2016 3:52:17 PM Version By: [name] Kanner MD, [name] [name] Entered By: Elpidio Eric on 09/09/2016 15:53:59 Mckelvie, Munira ([id number]) -------------------------------------------------------------------------------- SuperBill Details Patient Name: [name] [name] Date of Service: 09/09/2016 Medical Record Patient Account Number: [id number] Number: Afful, [name] [name], [name] [name], Treating [name] [name]: 04/22/1934 (82 y.o. [place] [name] Date of Birth/Sex: Female) Other Clinician: Primary Care Physician: Barbette Reichmann Treating Adore Kithcart Referring Physician: Ricarda Frame Physician/Extender: Weeks in Treatment: 0 Diagnosis Coding ICD-10 Codes Code Description E11.622 Type 2 diabetes mellitus with other skin ulcer Unspecified open wound of abdominal wall, periumbilic region without penetration into S31.105A peritoneal cavity, initial encounter L02.211 Cutaneous abscess of abdominal wall Facility Procedures CPT4: Description Modifier Quantity Code 78295621 99213 - WOUND CARE VISIT-LEV 3 EST PT 1 CPT4: 30865784 11042 - DEB SUBQ TISSUE 20 SQ CM/< 1 ICD-10 Description Diagnosis E11.622 Type 2 diabetes mellitus with other skin ulcer S31.105A Unspecified open wound of abdominal wall, periumbilic region without penetration into peritoneal cavity,  initial encounter  L02.211 Cutaneous abscess of abdominal wall Physician Procedures CPT4: Description Modifier Quantity Code 6962952 99204 - WC PHYS LEVEL 4 - NEW PT 25 1 ICD-10 Description Diagnosis E11.622 Type 2 diabetes mellitus with other skin ulcer S31.105A Unspecified open wound of abdominal wall, periumbilic region without  penetration into peritoneal cavity, initial encounter L02.211 Cutaneous abscess of abdominal wall CPT4: 8413244 11042 - WC PHYS SUBQ TISS 20 SQ CM 1 Description Oppedisano, Sondra ([id number]) Electronic Signature(s) Signed: 09/09/2016 3:14:25 PM By: [name] Kanner MD, [name] [name] Entered By: [name] [name] on 09/09/2016 15:14:24

## 2016-09-14 ENCOUNTER — Other Ambulatory Visit: Payer: Self-pay

## 2016-09-14 ENCOUNTER — Encounter: Payer: Self-pay | Admitting: Emergency Medicine

## 2016-09-14 ENCOUNTER — Inpatient Hospital Stay
Admission: EM | Admit: 2016-09-14 | Discharge: 2016-09-17 | DRG: 291 | Disposition: A | Discharge status: Skilled Nursing Facility | Payer: Commercial Managed Care - HMO | Attending: Internal Medicine | Admitting: Internal Medicine

## 2016-09-14 ENCOUNTER — Emergency Department: Payer: Commercial Managed Care - HMO

## 2016-09-14 DIAGNOSIS — Z794 Long term (current) use of insulin: Secondary | ICD-10-CM

## 2016-09-14 DIAGNOSIS — J9601 Acute respiratory failure with hypoxia: Secondary | ICD-10-CM | POA: Diagnosis present

## 2016-09-14 DIAGNOSIS — R2681 Unsteadiness on feet: Secondary | ICD-10-CM

## 2016-09-14 DIAGNOSIS — Z87891 Personal history of nicotine dependence: Secondary | ICD-10-CM

## 2016-09-14 DIAGNOSIS — Z7982 Long term (current) use of aspirin: Secondary | ICD-10-CM | POA: Diagnosis not present

## 2016-09-14 DIAGNOSIS — E1122 Type 2 diabetes mellitus with diabetic chronic kidney disease: Secondary | ICD-10-CM | POA: Diagnosis present

## 2016-09-14 DIAGNOSIS — I509 Heart failure, unspecified: Secondary | ICD-10-CM | POA: Diagnosis not present

## 2016-09-14 DIAGNOSIS — M6281 Muscle weakness (generalized): Secondary | ICD-10-CM | POA: Diagnosis not present

## 2016-09-14 DIAGNOSIS — R739 Hyperglycemia, unspecified: Secondary | ICD-10-CM | POA: Diagnosis not present

## 2016-09-14 DIAGNOSIS — Z8673 Personal history of transient ischemic attack (TIA), and cerebral infarction without residual deficits: Secondary | ICD-10-CM | POA: Diagnosis not present

## 2016-09-14 DIAGNOSIS — E1140 Type 2 diabetes mellitus with diabetic neuropathy, unspecified: Secondary | ICD-10-CM | POA: Diagnosis present

## 2016-09-14 DIAGNOSIS — Z79899 Other long term (current) drug therapy: Secondary | ICD-10-CM

## 2016-09-14 DIAGNOSIS — N179 Acute kidney failure, unspecified: Secondary | ICD-10-CM | POA: Diagnosis present

## 2016-09-14 DIAGNOSIS — I130 Hypertensive heart and chronic kidney disease with heart failure and stage 1 through stage 4 chronic kidney disease, or unspecified chronic kidney disease: Secondary | ICD-10-CM | POA: Diagnosis present

## 2016-09-14 DIAGNOSIS — R079 Chest pain, unspecified: Secondary | ICD-10-CM | POA: Diagnosis not present

## 2016-09-14 DIAGNOSIS — E119 Type 2 diabetes mellitus without complications: Secondary | ICD-10-CM | POA: Diagnosis not present

## 2016-09-14 DIAGNOSIS — L02211 Cutaneous abscess of abdominal wall: Secondary | ICD-10-CM | POA: Diagnosis not present

## 2016-09-14 DIAGNOSIS — I679 Cerebrovascular disease, unspecified: Secondary | ICD-10-CM | POA: Diagnosis not present

## 2016-09-14 DIAGNOSIS — R0902 Hypoxemia: Secondary | ICD-10-CM

## 2016-09-14 DIAGNOSIS — R0602 Shortness of breath: Secondary | ICD-10-CM | POA: Diagnosis not present

## 2016-09-14 DIAGNOSIS — N183 Chronic kidney disease, stage 3 (moderate): Secondary | ICD-10-CM | POA: Diagnosis present

## 2016-09-14 DIAGNOSIS — Z5189 Encounter for other specified aftercare: Secondary | ICD-10-CM | POA: Diagnosis not present

## 2016-09-14 DIAGNOSIS — E876 Hypokalemia: Secondary | ICD-10-CM | POA: Diagnosis not present

## 2016-09-14 DIAGNOSIS — I5033 Acute on chronic diastolic (congestive) heart failure: Secondary | ICD-10-CM | POA: Diagnosis present

## 2016-09-14 DIAGNOSIS — R0600 Dyspnea, unspecified: Secondary | ICD-10-CM | POA: Diagnosis present

## 2016-09-14 DIAGNOSIS — E785 Hyperlipidemia, unspecified: Secondary | ICD-10-CM | POA: Diagnosis present

## 2016-09-14 LAB — BLOOD GAS, ARTERIAL
ACID-BASE EXCESS: 1.9 mmol/L (ref 0.0–2.0)
Bicarbonate: 27.7 mmol/L (ref 20.0–28.0)
Delivery systems: POSITIVE
FIO2: 0.5
Mechanical Rate: 8
O2 SAT: 96.7 %
PATIENT TEMPERATURE: 37
PCO2 ART: 48 mmHg (ref 32.0–48.0)
PO2 ART: 90 mmHg (ref 83.0–108.0)
pH, Arterial: 7.37 (ref 7.350–7.450)

## 2016-09-14 LAB — CBC WITH DIFFERENTIAL/PLATELET
BASOS PCT: 1 %
Basophils Absolute: 0 10*3/uL (ref 0–0.1)
EOS ABS: 0.2 10*3/uL (ref 0–0.7)
EOS PCT: 3 %
HCT: 35.4 % (ref 35.0–47.0)
HEMOGLOBIN: 11.9 g/dL — AB (ref 12.0–16.0)
LYMPHS ABS: 3.4 10*3/uL (ref 1.0–3.6)
Lymphocytes Relative: 54 %
MCH: 31.5 pg (ref 26.0–34.0)
MCHC: 33.8 g/dL (ref 32.0–36.0)
MCV: 93.3 fL (ref 80.0–100.0)
MONO ABS: 0.4 10*3/uL (ref 0.2–0.9)
MONOS PCT: 6 %
NEUTROS PCT: 36 %
Neutro Abs: 2.2 10*3/uL (ref 1.4–6.5)
Platelets: 209 10*3/uL (ref 150–440)
RBC: 3.79 MIL/uL — ABNORMAL LOW (ref 3.80–5.20)
RDW: 17.6 % — AB (ref 11.5–14.5)
WBC: 6.2 10*3/uL (ref 3.6–11.0)

## 2016-09-14 LAB — COMPREHENSIVE METABOLIC PANEL
ALBUMIN: 3.1 g/dL — AB (ref 3.5–5.0)
ALK PHOS: 92 U/L (ref 38–126)
ALT: 13 U/L — ABNORMAL LOW (ref 14–54)
ANION GAP: 6 (ref 5–15)
AST: 29 U/L (ref 15–41)
BILIRUBIN TOTAL: 0.7 mg/dL (ref 0.3–1.2)
BUN: 24 mg/dL — ABNORMAL HIGH (ref 6–20)
CALCIUM: 8.9 mg/dL (ref 8.9–10.3)
CO2: 28 mmol/L (ref 22–32)
Chloride: 105 mmol/L (ref 101–111)
Creatinine, Ser: 1.39 mg/dL — ABNORMAL HIGH (ref 0.44–1.00)
GFR calc non Af Amer: 35 mL/min — ABNORMAL LOW (ref >60)
GFR, EST AFRICAN AMERICAN: 40 mL/min — AB (ref >60)
Glucose, Bld: 348 mg/dL — ABNORMAL HIGH (ref 65–99)
POTASSIUM: 4.2 mmol/L (ref 3.5–5.1)
SODIUM: 139 mmol/L (ref 135–145)
TOTAL PROTEIN: 8.1 g/dL (ref 6.5–8.1)

## 2016-09-14 LAB — TROPONIN I: Troponin I: 0.03 ng/mL (ref <0.03)

## 2016-09-14 LAB — BRAIN NATRIURETIC PEPTIDE: B NATRIURETIC PEPTIDE 5: 191 pg/mL — AB (ref 0.0–100.0)

## 2016-09-14 MED ORDER — FUROSEMIDE 10 MG/ML IJ SOLN
60.0000 mg | Freq: Once | INTRAMUSCULAR | Status: AC
Start: 2016-09-14 — End: 2016-09-14
  Administered 2016-09-14: 60 mg via INTRAVENOUS
  Filled 2016-09-14: qty 8

## 2016-09-14 NOTE — ED Provider Notes (Addendum)
[facility] Emergency Department Provider Note  Time seen: 10:10 PM  I have reviewed the triage vital signs and the nursing notes.   HISTORY  Chief Complaint Respiratory Distress    HPI [name] [name] is a 82 y.o. female with a past medical history of diabetes, hypertension, CVA, who presents to the emergency department for unresponsiveness from her nursing facility. [name] currently at [facility] since 08/25/16, after hospital admission from abdominal abscess. Tonight EMS was called out for unresponsiveness. EMS states upon arrival of patient was unresponsive with very decreased breath sounds bilaterally and a room air saturation of 70%. [name] placed on CPAP given duo nebs, albuterol, 125 mg of Solu-Medrol, and patient improved dramatically. Initial blood pressure 240 systolic which decreased to 180 systolic in route to the hospital. Patient arrived via emergency traffic awake alert and oriented on CPAP. Patient denies chest pain at this time.  Past Medical History:  Diagnosis Date  . Diabetes mellitus without complication (HCC)   . Hypertension   . Stroke [facility])     Patient Active Problem List   Diagnosis Date Noted  . Carpal tunnel syndrome 09/01/2016  . Hypertension 09/01/2016  . PVD (peripheral vascular disease) (HCC) 09/01/2016  . Open abdominal wall wound 09/01/2016  . Abscess 08/21/2016  . Type 2 diabetes mellitus with complication (HCC) 05/20/2015  . Visual disturbance 05/20/2015  . H/O: CVA (cerebrovascular accident) 01/21/2015  . Difficulty in walking 08/27/2014  . Diabetes (HCC) 08/20/2014  . Diabetic neuropathy (HCC) 08/20/2014  . Dizzy spells 08/20/2014  . Hand numbness 08/20/2014  . Obesity 08/20/2014    History reviewed. No pertinent surgical history.  Prior to Admission medications   Medication Sig Start Date End Date Taking? Authorizing Provider  amoxicillin-clavulanate (AUGMENTIN) 875-125 MG tablet Take 1 tablet by  mouth every 12 (twelve) hours. 08/25/16   Enedina Finner, MD  aspirin EC 81 MG tablet Take 1 tablet by mouth 1 day or 1 dose.    Historical Provider, MD  atorvastatin (LIPITOR) 10 MG tablet Take 1 tablet by mouth daily. 03/26/16   Historical Provider, MD  gabapentin (NEURONTIN) 100 MG capsule Take 1 capsule by mouth daily. 02/23/16   Historical Provider, MD  insulin aspart protamine- aspart (NOVOLOG MIX 70/30) (70-30) 100 UNIT/ML injection Inject 0.38 mLs (38 Units total) into the skin daily with breakfast. 08/26/16   Enedina Finner, MD  insulin aspart protamine- aspart (NOVOLOG MIX 70/30) (70-30) 100 UNIT/ML injection Inject 0.1 mLs (10 Units total) into the skin daily with supper. 08/25/16   Enedina Finner, MD  KLOR-CON M20 20 MEQ tablet Take 1 tablet by mouth daily. 02/23/16   Historical Provider, MD  Vitamin D, Ergocalciferol, (DRISDOL) 50000 units CAPS capsule Take 1 capsule by mouth once a week. 03/26/16   Historical Provider, MD    No Known Allergies  History reviewed. No pertinent family history.  Social History Social History  Substance Use Topics  . Smoking status: Former Smoker    Types: Cigarettes  . Smokeless tobacco: Never Used  . Alcohol use No    Review of Systems Constitutional: Negative for fever. Cardiovascular: Negative for chest pain. Respiratory: Positive for shortness of breath. Gastrointestinal: Negative for abdominal pain Neurological: Negative for headache 10-point ROS otherwise negative.  ____________________________________________   PHYSICAL EXAM:  VITAL SIGNS: ED Triage Vitals  Enc Vitals Group     BP 09/14/16 2154 (!) 175/77     Pulse Rate 09/14/16 2154 100     Resp 09/14/16 2154 Marland Kitchen)  21     Temp 09/14/16 2154 98.1 F (36.7 C)     Temp src --      SpO2 09/14/16 2154 100 %     Weight 09/14/16 2201 160 lb (72.6 kg)     Height 09/14/16 2201 5\' 2"  (1.575 m)     Head Circumference --      Peak Flow --      Pain Score --      Pain Loc --      Pain Edu? --      Excl.  in GC? --     Constitutional: Alert and oriented. Mild distress due to respiratory distress with a CPAP machine. Eyes: Normal exam ENT   Head: Normocephalic and atraumatic   Mouth/Throat: Mucous membranes are moist. Cardiovascular: Normal rate, regular rhythm. Respiratory: Moderate tachypnea, decreased breath sounds bilaterally with mild external wheezes bilaterally. Gastrointestinal: Soft and nontender. No distention.  Musculoskeletal: Nontender with normal range of motion in all extremities. Slight lower extremity edema, equal bilaterally. Neurologic:  Normal speech and language. No gross focal neurologic deficits Skin:  Skin is warm, dry and intact.  Psychiatric: Mood and affect are normal.   ____________________________________________    EKG  EKG reviewed and interpreted, so shows sinus rhythm at 100 cc/m, slightly widened QRS, left axis deviation, nonspecific ST changes but no ST elevation.  ____________________________________________    RADIOLOGY  X-ray consistent with CHF exacerbation.  ____________________________________________   INITIAL IMPRESSION / ASSESSMENT AND PLAN / ED COURSE  Pertinent labs & imaging results that were available during my care of the patient were reviewed by me and considered in my medical decision making (see chart for details).  The patient presents in the emergency department from her nursing facility for unresponsiveness. Patient found to be very somnolent, room air saturation of 70%. Placed on CPAP given Solu-Medrol, DuoNeb, albuterol, and brought to the emergency department. Upon arrival the patient has a O2 saturation 95% on CPAP, patient is awake alert oriented, but still has moderate respiratory distress difficulty breathing with tachypnea. Patient's blood pressure initially greater in 200 systolic, which decreased to approximately 170 upon arrival. Given the patient's significant respiratory distress with hypoxia and  significantly elevated blood pressure this could suggest flash pulmonary edema. In reviewing the patient's records I see no history of CHF or COPD. We will continue the patient on BiPAP in the emergency department. Currently awaiting lab and imaging results.    Patient's blood pressure currently 159/76. She is awake alert oriented, appears much improved from arrival. We will trial the patient off of BiPAP as she appears very well at this time. I have dosed IV Lasix. Patient will be admitted for further treatment.  CRITICAL CARE Performed by: Minna Antis   Total critical care time: 30 minutes  Critical care time was exclusive of separately billable procedures and treating other patients.  Critical care was necessary to treat or prevent imminent or life-threatening deterioration.  Critical care was time spent personally by me on the following activities: development of treatment plan with patient and/or surrogate as well as nursing, discussions with consultants, evaluation of patient's response to treatment, examination of patient, obtaining history from patient or surrogate, ordering and performing treatments and interventions, ordering and review of laboratory studies, ordering and review of radiographic studies, pulse oximetry and re-evaluation of patient's condition.    ____________________________________________   FINAL CLINICAL IMPRESSION(S) / ED DIAGNOSES  Hypoxia Dyspnea Unresponsiveness CHF exacerbation   Minna Antis, MD 09/14/16 [phone]  Minna AntisKevin Aksh Swart, MD 09/14/16 [phone]

## 2016-09-14 NOTE — ED Notes (Signed)
Pt presents to ED via EMS from [facility] for respiratory distress. Nursing home called EMS, stating that pt coded briefly but no CPR initiated. Pt was found awake with diminished airway and saturation of 73%. Pt arrived to ED on BiPAP. EMS given duoneb x1, albuterol x1 and solumedrol 125mg  prior to arrival to ED. Initial vital per EMS BP-245/129, HR-107, CBG-362.

## 2016-09-15 ENCOUNTER — Inpatient Hospital Stay: Payer: Commercial Managed Care - HMO

## 2016-09-15 ENCOUNTER — Inpatient Hospital Stay: Admit: 2016-09-15 | Payer: Commercial Managed Care - HMO

## 2016-09-15 ENCOUNTER — Inpatient Hospital Stay
Admit: 2016-09-15 | Discharge: 2016-09-15 | Disposition: A | Discharge status: Home / Self Care | Payer: Commercial Managed Care - HMO | Attending: Cardiovascular Disease | Admitting: Cardiovascular Disease

## 2016-09-15 ENCOUNTER — Encounter: Payer: Self-pay | Admitting: *Deleted

## 2016-09-15 DIAGNOSIS — Z87891 Personal history of nicotine dependence: Secondary | ICD-10-CM | POA: Diagnosis not present

## 2016-09-15 DIAGNOSIS — I5033 Acute on chronic diastolic (congestive) heart failure: Secondary | ICD-10-CM | POA: Diagnosis present

## 2016-09-15 DIAGNOSIS — E1122 Type 2 diabetes mellitus with diabetic chronic kidney disease: Secondary | ICD-10-CM | POA: Diagnosis present

## 2016-09-15 DIAGNOSIS — E1140 Type 2 diabetes mellitus with diabetic neuropathy, unspecified: Secondary | ICD-10-CM | POA: Diagnosis present

## 2016-09-15 DIAGNOSIS — N183 Chronic kidney disease, stage 3 (moderate): Secondary | ICD-10-CM | POA: Diagnosis present

## 2016-09-15 DIAGNOSIS — J9601 Acute respiratory failure with hypoxia: Secondary | ICD-10-CM | POA: Diagnosis present

## 2016-09-15 DIAGNOSIS — R0600 Dyspnea, unspecified: Secondary | ICD-10-CM | POA: Diagnosis present

## 2016-09-15 DIAGNOSIS — Z7982 Long term (current) use of aspirin: Secondary | ICD-10-CM | POA: Diagnosis not present

## 2016-09-15 DIAGNOSIS — Z8673 Personal history of transient ischemic attack (TIA), and cerebral infarction without residual deficits: Secondary | ICD-10-CM | POA: Diagnosis not present

## 2016-09-15 DIAGNOSIS — I130 Hypertensive heart and chronic kidney disease with heart failure and stage 1 through stage 4 chronic kidney disease, or unspecified chronic kidney disease: Secondary | ICD-10-CM | POA: Diagnosis present

## 2016-09-15 DIAGNOSIS — Z794 Long term (current) use of insulin: Secondary | ICD-10-CM | POA: Diagnosis not present

## 2016-09-15 DIAGNOSIS — E785 Hyperlipidemia, unspecified: Secondary | ICD-10-CM | POA: Diagnosis present

## 2016-09-15 DIAGNOSIS — N179 Acute kidney failure, unspecified: Secondary | ICD-10-CM | POA: Diagnosis present

## 2016-09-15 DIAGNOSIS — Z79899 Other long term (current) drug therapy: Secondary | ICD-10-CM | POA: Diagnosis not present

## 2016-09-15 LAB — MRSA PCR SCREENING: MRSA BY PCR: NEGATIVE

## 2016-09-15 LAB — TROPONIN I
TROPONIN I: 0.08 ng/mL — AB (ref <0.03)
TROPONIN I: 0.08 ng/mL — AB (ref <0.03)
Troponin I: 0.08 ng/mL (ref <0.03)

## 2016-09-15 LAB — ECHOCARDIOGRAM COMPLETE
Height: 62 in
WEIGHTICAEL: 2556.8 [oz_av]

## 2016-09-15 LAB — TSH: TSH: 0.586 u[IU]/mL (ref 0.350–4.500)

## 2016-09-15 LAB — GLUCOSE, CAPILLARY
GLUCOSE-CAPILLARY: 484 mg/dL — AB (ref 65–99)
Glucose-Capillary: 447 mg/dL — ABNORMAL HIGH (ref 65–99)
Glucose-Capillary: 348 mg/dL — ABNORMAL HIGH (ref 65–99)

## 2016-09-15 MED ORDER — SODIUM CHLORIDE 0.9 % IV SOLN: 250.0000 mL | INTRAVENOUS | Status: DC | PRN

## 2016-09-15 MED ORDER — GABAPENTIN 100 MG PO CAPS
100.0000 mg | ORAL_CAPSULE | Freq: Every day | ORAL | Status: DC
  Administered 2016-09-15 – 2016-09-17 (×3): 100 mg via ORAL
  Filled 2016-09-15 (×3): qty 1

## 2016-09-15 MED ORDER — INSULIN ASPART 100 UNIT/ML ~~LOC~~ SOLN
0.0000 [IU] | Freq: Every day | SUBCUTANEOUS | Status: DC
  Administered 2016-09-15: 4 [IU] via SUBCUTANEOUS
  Filled 2016-09-15: qty 4

## 2016-09-15 MED ORDER — ATORVASTATIN CALCIUM 20 MG PO TABS
10.0000 mg | ORAL_TABLET | Freq: Every day | ORAL | Status: DC
  Administered 2016-09-15 – 2016-09-17 (×3): 10 mg via ORAL
  Filled 2016-09-15 (×3): qty 1

## 2016-09-15 MED ORDER — INSULIN ASPART PROT & ASPART (70-30 MIX) 100 UNIT/ML ~~LOC~~ SUSP
38.0000 [IU] | Freq: Every day | SUBCUTANEOUS | Status: DC
  Administered 2016-09-15 – 2016-09-16 (×2): 38 [IU] via SUBCUTANEOUS
  Filled 2016-09-15 (×2): qty 38

## 2016-09-15 MED ORDER — LISINOPRIL 5 MG PO TABS
2.5000 mg | ORAL_TABLET | Freq: Every day | ORAL | Status: DC
  Administered 2016-09-15 – 2016-09-17 (×3): 2.5 mg via ORAL
  Filled 2016-09-15 (×3): qty 1

## 2016-09-15 MED ORDER — INSULIN ASPART PROT & ASPART (70-30 MIX) 100 UNIT/ML ~~LOC~~ SUSP
10.0000 [IU] | Freq: Every day | SUBCUTANEOUS | Status: DC
  Administered 2016-09-15: 10 [IU] via SUBCUTANEOUS
  Filled 2016-09-15: qty 10

## 2016-09-15 MED ORDER — ONDANSETRON HCL 4 MG/2ML IJ SOLN: 4.0000 mg | Freq: Four times a day (QID) | INTRAMUSCULAR | Status: DC | PRN

## 2016-09-15 MED ORDER — POTASSIUM CHLORIDE CRYS ER 20 MEQ PO TBCR
20.0000 meq | EXTENDED_RELEASE_TABLET | Freq: Every day | ORAL | Status: DC
  Administered 2016-09-15 – 2016-09-17 (×3): 20 meq via ORAL
  Filled 2016-09-15 (×3): qty 1

## 2016-09-15 MED ORDER — ENOXAPARIN SODIUM 40 MG/0.4ML ~~LOC~~ SOLN
40.0000 mg | Freq: Every day | SUBCUTANEOUS | Status: DC
  Administered 2016-09-15 – 2016-09-16 (×2): 40 mg via SUBCUTANEOUS
  Filled 2016-09-15 (×2): qty 0.4

## 2016-09-15 MED ORDER — INSULIN ASPART 100 UNIT/ML ~~LOC~~ SOLN
10.0000 [IU] | Freq: Once | SUBCUTANEOUS | Status: AC
  Administered 2016-09-15: 10 [IU] via SUBCUTANEOUS
  Filled 2016-09-15: qty 10

## 2016-09-15 MED ORDER — ASPIRIN EC 81 MG PO TBEC
81.0000 mg | DELAYED_RELEASE_TABLET | ORAL | Status: DC
  Administered 2016-09-15 – 2016-09-17 (×3): 81 mg via ORAL
  Filled 2016-09-15 (×3): qty 1

## 2016-09-15 MED ORDER — INSULIN ASPART 100 UNIT/ML ~~LOC~~ SOLN
0.0000 [IU] | Freq: Three times a day (TID) | SUBCUTANEOUS | Status: DC
  Administered 2016-09-15: 12 [IU] via SUBCUTANEOUS
  Administered 2016-09-16: 1 [IU] via SUBCUTANEOUS
  Administered 2016-09-16: 3 [IU] via SUBCUTANEOUS
  Administered 2016-09-16 – 2016-09-17 (×3): 1 [IU] via SUBCUTANEOUS
  Filled 2016-09-15: qty 12
  Filled 2016-09-15 (×3): qty 1

## 2016-09-15 MED ORDER — CARVEDILOL 6.25 MG PO TABS
3.1250 mg | ORAL_TABLET | Freq: Two times a day (BID) | ORAL | Status: DC
  Administered 2016-09-15 – 2016-09-17 (×4): 3.125 mg via ORAL
  Filled 2016-09-15 (×5): qty 1

## 2016-09-15 MED ORDER — VITAMIN D (ERGOCALCIFEROL) 1.25 MG (50000 UNIT) PO CAPS
50000.0000 [IU] | ORAL_CAPSULE | ORAL | Status: DC
  Administered 2016-09-16: 50000 [IU] via ORAL
  Filled 2016-09-15: qty 1

## 2016-09-15 MED ORDER — SODIUM CHLORIDE 0.9% FLUSH
3.0000 mL | Freq: Two times a day (BID) | INTRAVENOUS | Status: DC
  Administered 2016-09-15 – 2016-09-17 (×6): 3 mL via INTRAVENOUS

## 2016-09-15 MED ORDER — ACETAMINOPHEN 325 MG PO TABS: 650.0000 mg | ORAL_TABLET | ORAL | Status: DC | PRN

## 2016-09-15 MED ORDER — SODIUM CHLORIDE 0.9% FLUSH: 3.0000 mL | INTRAVENOUS | Status: DC | PRN

## 2016-09-15 MED ORDER — FUROSEMIDE 10 MG/ML IJ SOLN
20.0000 mg | Freq: Every day | INTRAMUSCULAR | Status: DC
  Administered 2016-09-15 – 2016-09-16 (×2): 20 mg via INTRAVENOUS
  Filled 2016-09-15 (×2): qty 2

## 2016-09-15 NOTE — H&P (Addendum)
[facility] [place] [facility] Ad[facility] History and Physical Tonye RoyaltyAlexis Katrice [name], D.O.  ---------------------------------------------------------------------------------------------------------------------   PATIENT NAMFrederich [name]: [name] [name] MR#: [id number] DATE OF BIRTH: 04-Feb-1934 DATE OF ADMISSION: 09/14/2016 PRIMARY CARE PHYSICIAN: Barbette ReichmannHANDE,VISHWANATH, MD  REQUESTING/REFERRING PHYSICIAN: ED Dr. Lenard LancePaduchowski  CHIEF COMPLAINT: Chief Complaint  Patient presents with  . Respiratory Distress    HISTORY OF PRESENT ILLNESS: [name] [name] is a 82 y.o. female with a known history of CHF, diabetes, CVA was in a usual state of health until this afternoon when she experienced sudden onset respiratory distress. Patient was found unresponsive, hypoxic, reportedly sustained a CODE BLUE without the initiation of CPR. Per EMS records patient was found to be unresponsive with decreased breath sounds and a room air O2 saturation of 70%. Patient  was placed on CPAP and received DuoNeb, albuterol and Solu-Medrol per EMS. In the emergency department she received 60 mg of IV Lasix. She was briefly placed on BiPAP in the emergency department. She desatted into the low 80s on room air and was subsequently placed back on 2 L where she has been maintaining her sats in the mid 90s.  On my questioning patient states that she is significantly improved and is almost back to her baseline. Patient herself states that she has been having worsening shortness of breath with associated nonproductive cough. She denies any chest pain, palpitations, lower from edema.   Otherwise there has been no change in status. Patient has been taking medication as prescribed and there has been no recent change in medication or diet.  There has been no recent illness, travel or sick contacts.    Of note patient was admitted to this facility on 08/22/2016 with diagnosis of abdominal wall abscess. She has been at [facility]  rehabilitation facility since 08/25/2016.   Patient denies fevers/chills, weakness, dizziness, chest pain, N/V/C/D, abdominal pain, dysuria/frequency, changes in mental status.   EMS/ED COURSE:   See above  PAST MEDICAL HISTORY: Past Medical History:  Diagnosis Date  . Diabetes mellitus without complication (HCC)   . Hypertension   . Stroke [facility](HCC)       PAST SURGICAL HISTORY: History reviewed. No pertinent surgical history.    SOCIAL HISTORY: Social History  Substance Use Topics  . Smoking status: Former Smoker    Types: Cigarettes  . Smokeless tobacco: Never Used  . Alcohol use No      FAMILY HISTORY: History reviewed. No pertinent family history.   MEDICATIONS AT HOME: Prior to Admission medications   Medication Sig Start Date End Date Taking? Authorizing Provider  aspirin EC 81 MG tablet Take 1 tablet by mouth 1 day or 1 dose.   Yes Historical Provider, MD  atorvastatin (LIPITOR) 10 MG tablet Take 1 tablet by mouth daily. 03/26/16  Yes Historical Provider, MD  gabapentin (NEURONTIN) 100 MG capsule Take 1 capsule by mouth daily. 02/23/16  Yes Historical Provider, MD  insulin aspart protamine- aspart (NOVOLOG MIX 70/30) (70-30) 100 UNIT/ML injection Inject 0.38 mLs (38 Units total) into the skin daily with breakfast. 08/26/16  Yes Enedina FinnerSona Patel, MD  insulin aspart protamine- aspart (NOVOLOG MIX 70/30) (70-30) 100 UNIT/ML injection Inject 0.1 mLs (10 Units total) into the skin daily with supper. 08/25/16  Yes Enedina FinnerSona Patel, MD  KLOR-CON M20 20 MEQ tablet Take 1 tablet by mouth daily. 02/23/16  Yes Historical Provider, MD  Vitamin D, Ergocalciferol, (DRISDOL) 50000 units CAPS capsule Take 1 capsule by mouth once a week. 03/26/16  Yes Historical Provider, MD  amoxicillin-clavulanate (AUGMENTIN)  875-125 MG tablet Take 1 tablet by mouth every 12 (twelve) hours. Patient not taking: Reported on 09/14/2016 08/25/16   Enedina Finner, MD      DRUG ALLERGIES: No Known Allergies   REVIEW OF  SYSTEMS: CONSTITUTIONAL: No fever/chills, fatigue, weakness, weight gain/loss, headache EYES: No blurry or double vision. ENT: No tinnitus, postnasal drip, redness or soreness of the oropharynx. RESPIRATORY: Positive cough, wheeze,dyspnea. CARDIOVASCULAR: No chest pain, orthopnea, palpitations, syncope. GASTROINTESTINAL: No nausea, vomiting, constipation, diarrhea, abdominal pain, hematemesis, melena or hematochezia. GENITOURINARY: No dysuria or hematuria. ENDOCRINE: No polyuria or nocturia. No heat or cold intolerance. HEMATOLOGY: No anemia, bruising, bleeding. INTEGUMENTARY: No rashes, ulcers, lesions. MUSCULOSKELETAL: No arthritis, swelling, gout. NEUROLOGIC: No numbness, tingling, weakness or ataxia. No seizure-type activity. PSYCHIATRIC: No anxiety, depression, insomnia.  PHYSICAL EXAMINATION: VITAL SIGNS: Blood pressure (!) 172/84, pulse 90, temperature 98.1 F (36.7 C), resp. rate (!) 24, height 5\' 2"  (1.575 m), weight 72.6 kg (160 lb), SpO2 93 %.  GENERAL: 82 y.o.-yearblack female patient, well-developed, well-nourished lying in the bed in no acute distress.  Pleasant and cooperative.   HEENT: Head atraumatic, normocephalic. Pupils equal, round, reactive to light and accommodation. No scleral icterus. Extraocular muscles intact. Nares are patent. Oropharynx is clear. Mucus membranes moist. NECK: Supple, full range of motion. No JVD, no bruit heard. No thyroid enlargement, no tenderness, no cervical lymphadenopathy. CHEST: Decreased breath sounds bilaterally. No wheezing, rales, rhonchi or crackles. No use of accessory muscles of respiration.  No reproducible chest wall tenderness.  CARDIOVASCULAR: S1, S2 normal. No murmurs, rubs, or gallops. Cap refill <2 seconds. ABDOMEN: Soft, nontender, nondistended. No rebound, guarding, rigidity. Normoactive bowel sounds present in all four quadrants. No organomegaly or mass. EXTREMITIES: Full range of motion. No pedal edema, cyanosis, or  clubbing. NEUROLOGIC: Cranial nerves II through XII are grossly intact with no focal sensorimotor deficit. Muscle strength 5/5 in all extremities. Sensation intact. Gait not checked. PSYCHIATRIC: The patient is alert and oriented x 3. Normal affect, mood, thought content. SKIN: Warm, dry, and intact without obvious rash, lesion, or ulcer.  LABORATORY PANEL:  CBC  Recent Labs Lab 09/14/16 2158  WBC 6.2  HGB 11.9*  HCT 35.4  PLT 209   ----------------------------------------------------------------------------------------------------------------- Chemistries  Recent Labs Lab 09/14/16 2158  NA 139  K 4.2  CL 105  CO2 28  GLUCOSE 348*  BUN 24*  CREATININE 1.39*  CALCIUM 8.9  AST 29  ALT 13*  ALKPHOS 92  BILITOT 0.7   ------------------------------------------------------------------------------------------------------------------ Cardiac Enzymes  Recent Labs Lab 09/14/16 2158  TROPONINI 0.03*   ------------------------------------------------------------------------------------------------------------------  RADIOLOGY: Dg Chest Portable 1 View  Result Date: 09/14/2016 CLINICAL DATA:  82 year old female with shortness of breath EXAM: PORTABLE CHEST 1 VIEW COMPARISON:  Chest radiograph dated 08/22/2016 FINDINGS: Single portable view of the chest demonstrates central vascular and interstitial prominence compatible with congestive changes. Small bilateral pleural effusions noted. Bilateral mid to lower lung field hazy densities may be related to congestive changes or atelectasis versus infiltrate. There is no pneumothorax. Stable appearing cardiac silhouette. No acute osseous pathology. IMPRESSION: Congestive changes with small bilateral pleural effusions. Pneumonia is not excluded. Clinical correlation and follow-up is recommended. Electronically Signed   By: Elgie Collard M.D.   On: 09/14/2016 23:31    EKG:  normal sinus rhythm at 100 bpm with left axis deviation and  nonspecific ST-T wave changes.  IMPRESSION AND PLAN:  This is a 82 y.o. female with a history of  diabetes, CHF and CVA now being admitted with:  1.  Acute hypoxic respiratory failure secondary to exacerbation of CHF-admit to inpatient for telemetry monitoring, diuresis, strict I's and O's, daily weights, cardiology consult. We'll check troponins and TSH.  Add Coreg and Ace. Check echo.   2. AK I-we'll repeat BMP in a.m. 3. History of hypertension-added Coreg.  4. History of diabetes-continue NovoLog Mix 70/30. Accu-Cheks before meals and at bedtime. Carb controlled diet. 5. History of hyperlipidemia-continue Lipitor 6. History of peripheral neuropathy-continue gabapentin 7. Continue vitamin D, aspirin   Diet/Nutrition: carb controlled, heart healthy Fluids: Hep-Lock DVT Px: Lovenox, SCDs and early ambulation Code Status: Full  All the records are reviewed and case discussed with ED provider. Management plans discussed with the patient and/or family who express understanding and agree with plan of care.   TOTAL TIME TAKING CARE OF THIS PATIENT: 60 minutes.   Leila Schuff D.O. on 09/15/2016 at 2:27 AM Between 7am to 6pm - Pager - [phone] After 6pm go to [email or web address] - Social research officer, government [facility] [place] [facility] Office [phone] CC: Primary care physician; Barbette Reichmann, MD     Note: This dictation was prepared with Dragon dictation along with smaller phrase technology. Any transcriptional errors that result from this process are unintentional.  female

## 2016-09-15 NOTE — Care Management (Signed)
Admitted with acute respiratory failure due to exac of COPD.  [facility] under [facility] skilled plan of care.  Patient may return to facility but return has to be approved by [employer] insurance company.  There is a PT consult pending

## 2016-09-15 NOTE — Progress Notes (Signed)
Pt blood sugar was 484 at 1816, day shift RN gave 12U sliding scale along with 10U 70/30 insulin, rechecked at 1934 blood sugar still at 447. MD paged, Dr. Clint GuyHower to put in one time order for 10U novolog. Will recheck blood sugar after insulin is given & continue to monitor. Shirley FriarAlexis Miller, RN

## 2016-09-15 NOTE — Progress Notes (Addendum)
Inpatient Diabetes Program Recommendations  AACE/ADA: New Consensus Statement on Inpatient Glycemic Control (2015)  Target Ranges:  Prepandial:   less than 140 mg/dL      Peak postprandial:   less than 180 mg/dL (1-2 hours)      Critically ill patients:  140 - 180 mg/dL   Results for [name] [name], Kenedi (MRN [id number]) as of 09/15/2016 09:19  Ref. Range 09/14/2016 21:58  Glucose Latest Ref Range: 65 - 99 mg/dL 409348 (H)   Results for [name] [name], [name] [name] (MRN [id number]) as of 09/15/2016 09:19  Ref. Range 08/22/2016 06:34  Hemoglobin A1C Latest Ref Range: 4.0 - 6.0 % 9.1 (H)    Review of Glycemic Control  Diabetes history: DM2 Outpatient Diabetes medications: Novolog Mix 70/30 38 units daily with breakfast, Novolog Mix 70/30 10 units daily with supper Current orders for Inpatient glycemic control: Novolog Mix 70/30 38 units daily with breakfast, Novolog Mix 70/30 10 units daily with supper  Inpatient Diabetes Program Recommendations:  Please consider moniitoring CBG's and Novolog sensitive correction TIDWC & QHS.  Thank you,  Kristine LineaKaren Drisana Schweickert, RN, BSN Diabetes Coordinator Inpatient Diabetes Program [phone] (Team Pager) [phone] ([facility] office) [phone] [facility] office) [phone] [facility] office)

## 2016-09-15 NOTE — Clinical Social Work Note (Signed)
Clinical Social Work Assessment  Patient Details  Name: [name] [name] MRN: [id number] Date of Birth: April 17, 1934  Date of referral:  09/15/16               Reason for consult:  Other (Comment Required) (From [facility] for short term rehab. )                Permission sought to share information with:  Oceanographeracility Contact Representative Permission granted to share information::  Yes, Verbal Permission Granted  Name::      [facility]   Agency::   Skilled Nursing Facility   Relationship::     Contact Information:     Housing/Transportation Living arrangements for the past 2 months:  Skilled Nursing Facility, Single Family Home Source of Information:  Other (Comment Required) (Brother Merchant navy officerGary ) Patient Interpreter Needed:  None Criminal Activity/Legal Involvement Pertinent to Current Situation/Hospitalization:  No - Comment as needed Significant Relationships:  Adult Children, Siblings Lives with:  Adult Children, Siblings Do you feel safe going back to the place where you live?    Need for family participation in patient care:  Yes (Comment)  Care giving concerns:  [name] from [facility] for short term rehab.    Social Worker assessment / plan:  Visual merchandiserClinical Social Worker (CSW) received SNF consult. [name] from [facility]. Per Lovett CalenderJoseph [facility] liaison patient is a short term rehab resident and can return when stable. [facility] in [facility] to [facility] to [facility]. Per chart patient is not alert and oriented. CSW contacted patient's daughter and sister and left voicemails. CSW also contacted patient's brother Jillyn HiddenGary. Per Jillyn HiddenGary patient has been at [facility] since the beginning of September 2017. Per Jillyn HiddenGary patient lives in [place] with her daughter Judeth CornfieldStephanie. Per Jillyn HiddenGary patient does not have a HPOA and the family is working on that. Jillyn HiddenGary is agreeable for patient to return to [facility]. CSW explained to Jillyn HiddenGary that patient's insurance [employer] [facility] will have to give another authorization. FL2 complete and  faxed out. CSW will continue to follow and assist as needed.    Employment status:  Disabled (Comment on whether or not currently receiving Disability), Retired Database administratornsurance information:  Managed Medicare PT Recommendations:  Not assessed at this time Information / Referral to community resources:  Skilled Nursing Facility  Patient/Family's Response to care:  Patient's brother Jillyn HiddenGary is agreeable for patient to return to [facility].   Patient/Family's Understanding of and Emotional Response to Diagnosis, Current Treatment, and Prognosis:  Patient's brother Jillyn HiddenGary was pleasant and thanked CSW for calling.   Emotional Assessment Appearance:  Appears stated age Attitude/Demeanor/Rapport:  Unable to Assess Affect (typically observed):  Unable to Assess Orientation:  Oriented to Self, Fluctuating Orientation (Suspected and/or reported Sundowners) Alcohol / Substance use:  Not Applicable Psych involvement (Current and /or in the community):  No (Comment)  Discharge Needs  Concerns to be addressed:  Discharge Planning Concerns Readmission within the last 30 days:  No Current discharge risk:  Dependent with Mobility Barriers to Discharge:  Continued Medical Work up   [employer], Darleen CrockerBailey M, LCSW 09/15/2016, 1:53 PM

## 2016-09-15 NOTE — H&P (Signed)
[name] [name] is a 82 y.o. female  [id number]  Primary Cardiologist: Adrian Blackwater Reason for Consultation: CHF  HPI: This is a 82 year old white female with a past medical history of CHF CVA diabetes states that she was at home yesterday and all of a sudden became very short of breath and hypoxic and slurred speech. She did not have any chest pain and was found to be in in respiratory failure and was placed on BiPAP. She still feels short of breath and is able to vocalize sentences without any problem.   Review of Systems: No weakness of any side no chest pain   Past Medical History:  Diagnosis Date  . Diabetes mellitus without complication (HCC)   . Hypertension   . Stroke [facility])     Medications Prior to Admission  Medication Sig Dispense Refill  . aspirin EC 81 MG tablet Take 1 tablet by mouth 1 day or 1 dose.    Marland Kitchen atorvastatin (LIPITOR) 10 MG tablet Take 1 tablet by mouth daily.    Marland Kitchen gabapentin (NEURONTIN) 100 MG capsule Take 1 capsule by mouth daily.    . insulin aspart protamine- aspart (NOVOLOG MIX 70/30) (70-30) 100 UNIT/ML injection Inject 0.38 mLs (38 Units total) into the skin daily with breakfast. 10 mL 11  . insulin aspart protamine- aspart (NOVOLOG MIX 70/30) (70-30) 100 UNIT/ML injection Inject 0.1 mLs (10 Units total) into the skin daily with supper. 10 mL 11  . KLOR-CON M20 20 MEQ tablet Take 1 tablet by mouth daily.    . Vitamin D, Ergocalciferol, (DRISDOL) 50000 units CAPS capsule Take 1 capsule by mouth once a week.    Marland Kitchen amoxicillin-clavulanate (AUGMENTIN) 875-125 MG tablet Take 1 tablet by mouth every 12 (twelve) hours. (Patient not taking: Reported on 09/14/2016) 18 tablet 0     . aspirin EC  81 mg Oral 1 day or 1 dose  . atorvastatin  10 mg Oral Daily  . carvedilol  3.125 mg Oral BID WC  . enoxaparin (LOVENOX) injection  40 mg Subcutaneous QHS  . furosemide  20 mg Intravenous Daily  . gabapentin  100 mg Oral Daily  . insulin aspart protamine- aspart   10 Units Subcutaneous Q supper  . insulin aspart protamine- aspart  38 Units Subcutaneous Q breakfast  . lisinopril  2.5 mg Oral Daily  . potassium chloride SA  20 mEq Oral Daily  . sodium chloride flush  3 mL Intravenous Q12H  . [START ON 09/16/2016] Vitamin D (Ergocalciferol)  50,000 Units Oral Weekly    Infusions:    No Known Allergies  Social History   Social History  . Marital status: Married    Spouse name: N/A  . Number of children: N/A  . Years of education: N/A   Occupational History  . Not on file.   Social History Main Topics  . Smoking status: Former Smoker    Types: Cigarettes  . Smokeless tobacco: Never Used  . Alcohol use No  . Drug use: No  . Sexual activity: Not on file   Other Topics Concern  . Not on file   Social History Narrative  . No narrative on file    History reviewed. No pertinent family history.  PHYSICAL EXAM: Vitals:   09/15/16 0302 09/15/16 0336  BP: (!) 170/83 (!) 167/83  Pulse: 91 95  Resp: 18 20  Temp:  98.2 F (36.8 C)     Intake/Output Summary (Last 24 hours) at 09/15/16 4098 Last data filed  at 09/15/16 0700  Gross per 24 hour  Intake                0 ml  Output              500 ml  Net             -500 ml    General:  Well appearing. No respiratory difficulty HEENT: normal Neck: supple. no JVD. Carotids 2+ bilat; no bruits. No lymphadenopathy or thryomegaly appreciated. Cor: PMI nondisplaced. Regular rate & rhythm. No rubs, gallops or murmurs. Lungs: clear Abdomen: soft, nontender, nondistended. No hepatosplenomegaly. No bruits or masses. Good bowel sounds. Extremities: no cyanosis, clubbing, rash, edema Neuro: alert & oriented x 3, cranial nerves grossly intact. moves all 4 extremities w/o difficulty. Affect pleasant.  WJX:BJYNWECG:Sinus rhythm with old inferior wall MI and nonspecific ST-T changes  Results for orders placed or performed during the hospital encounter of 09/14/16 (from the past 24 hour(s))  CBC with  Differential     Status: Abnormal   Collection Time: 09/14/16  9:58 PM  Result Value Ref Range   WBC 6.2 3.6 - 11.0 K/uL   RBC 3.79 (L) 3.80 - 5.20 MIL/uL   Hemoglobin 11.9 (L) 12.0 - 16.0 g/dL   HCT 29.535.4 62.135.0 - 30.847.0 %   MCV 93.3 80.0 - 100.0 fL   MCH 31.5 26.0 - 34.0 pg   MCHC 33.8 32.0 - 36.0 g/dL   RDW 65.717.6 (H) 84.611.5 - 96.214.5 %   Platelets 209 150 - 440 K/uL   Neutrophils Relative % 36 %   Neutro Abs 2.2 1.4 - 6.5 K/uL   Lymphocytes Relative 54 %   Lymphs Abs 3.4 1.0 - 3.6 K/uL   Monocytes Relative 6 %   Monocytes Absolute 0.4 0.2 - 0.9 K/uL   Eosinophils Relative 3 %   Eosinophils Absolute 0.2 0 - 0.7 K/uL   Basophils Relative 1 %   Basophils Absolute 0.0 0 - 0.1 K/uL  Comprehensive metabolic panel     Status: Abnormal   Collection Time: 09/14/16  9:58 PM  Result Value Ref Range   Sodium 139 135 - 145 mmol/L   Potassium 4.2 3.5 - 5.1 mmol/L   Chloride 105 101 - 111 mmol/L   CO2 28 22 - 32 mmol/L   Glucose, Bld 348 (H) 65 - 99 mg/dL   BUN 24 (H) 6 - 20 mg/dL   Creatinine, Ser 9.521.39 (H) 0.44 - 1.00 mg/dL   Calcium 8.9 8.9 - 84.110.3 mg/dL   Total Protein 8.1 6.5 - 8.1 g/dL   Albumin 3.1 (L) 3.5 - 5.0 g/dL   AST 29 15 - 41 U/L   ALT 13 (L) 14 - 54 U/L   Alkaline Phosphatase 92 38 - 126 U/L   Total Bilirubin 0.7 0.3 - 1.2 mg/dL   GFR calc non Af Amer 35 (L) >60 mL/min   GFR calc Af Amer 40 (L) >60 mL/min   Anion gap 6 5 - 15  Troponin I     Status: Abnormal   Collection Time: 09/14/16  9:58 PM  Result Value Ref Range   Troponin I 0.03 (HH) <0.03 ng/mL  Brain natriuretic peptide     Status: Abnormal   Collection Time: 09/14/16  9:58 PM  Result Value Ref Range   B Natriuretic Peptide 191.0 (H) 0.0 - 100.0 pg/mL  Blood gas, arterial     Status: None   Collection Time: 09/14/16 10:19 PM  Result Value  Ref Range   FIO2 0.50    Delivery systems BILEVEL POSITIVE AIRWAY PRESSURE    pH, Arterial 7.37 7.350 - 7.450   pCO2 arterial 48 32.0 - 48.0 mmHg   pO2, Arterial 90 83.0 -  108.0 mmHg   Bicarbonate 27.7 20.0 - 28.0 mmol/L   Acid-Base Excess 1.9 0.0 - 2.0 mmol/L   O2 Saturation 96.7 %   Patient temperature 37.0    Collection site RIGHT RADIAL    Sample type ARTERIAL DRAW    Allens test (pass/fail) PASS PASS   Mechanical Rate 8   MRSA PCR Screening     Status: None   Collection Time: 09/15/16  3:35 AM  Result Value Ref Range   MRSA by PCR NEGATIVE NEGATIVE  Troponin I     Status: Abnormal   Collection Time: 09/15/16  4:03 AM  Result Value Ref Range   Troponin I 0.08 (HH) <0.03 ng/mL  TSH     Status: None   Collection Time: 09/15/16  4:03 AM  Result Value Ref Range   TSH 0.586 0.350 - 4.500 uIU/mL   Dg Chest 2 View  Result Date: 09/15/2016 CLINICAL DATA:  History of CHF, diabetes, and previous CVA. Episode of shortness of breath and chest pain last night. Former smoker. EXAM: CHEST  2 VIEW COMPARISON:  Portable chest x-ray of September 14, 2016 FINDINGS: The lungs are reasonably well inflated. Bilateral pleural effusions are again demonstrated. The pulmonary interstitial markings are increased bilaterally. The cardiac silhouette is enlarged but its margins are obscured by the left pleural effusion. There is tortuosity of the ascending and descending thoracic aorta with mural calcification. The bony thorax is unremarkable. IMPRESSION: CHF with pulmonary interstitial edema and bilateral pleural effusions. Electronically Signed   By: David  [place] M.D.   On: 09/15/2016 08:01   Dg Chest Portable 1 View  Result Date: 09/14/2016 CLINICAL DATA:  82 year old female with shortness of breath EXAM: PORTABLE CHEST 1 VIEW COMPARISON:  Chest radiograph dated 08/22/2016 FINDINGS: Single portable view of the chest demonstrates central vascular and interstitial prominence compatible with congestive changes. Small bilateral pleural effusions noted. Bilateral mid to lower lung field hazy densities may be related to congestive changes or atelectasis versus infiltrate. There is no  pneumothorax. Stable appearing cardiac silhouette. No acute osseous pathology. IMPRESSION: Congestive changes with small bilateral pleural effusions. Pneumonia is not excluded. Clinical correlation and follow-up is recommended. Electronically Signed   By: Elgie Collard M.D.   On: 09/14/2016 23:31     ASSESSMENT AND PLAN: Respiratory failure/CHF with chest x-ray showing bilateral pleural effusion with questionable pneumonia. Agree with starting the patient on lisinopril and Corag as well as Lasix. We'll get an echocardiogram to further evaluate patient's ejection fraction and make further recommendation.  Renell Coaxum A

## 2016-09-15 NOTE — Consult Note (Signed)
WOC Nurse wound consult note Reason for Consult: Abdominal wall abscess Wound type: Healing right lower quadrant abscess  Measurement: 1 cm x 2.7 cm x 0.2 cm Wound bed: 100% pink granulation tissue Drainage: no appreciable drainage noted; no odor Periwound: intact skin Dressing procedure/placement/frequency: Please cleanse area with saline solution, pat dry. Cover with small foam dressing.  Change daily.  Discussed POC with patient and bedside nurse.  Re consult if needed, will not follow at this time. Thanks Helmut MusterSherry Malissie Musgrave MSN, RN, CNS-BC, [employer]OCN

## 2016-09-15 NOTE — [place] FL2 (Signed)
[place] MEDICAID FL2 LEVEL OF CARE SCREENING TOOL     IDENTIFICATION  Patient Name: [name] [name] Birthdate: [birth year] Sex: female Admission Date (Current Location): 09/14/2016  [place] and [place] Number:  Chiropodist and Address:  [facility], [address] [place], [place] [address]      Provider Number: [id number]  Attending Physician Name and Address:  Katha Hamming, MD  Relative Name and Phone Number:       Current Level of Care: Hospital Recommended Level of Care: Skilled Nursing Facility Prior Approval Number:    Date Approved/Denied:   PASRR Number:  [id number])  Discharge Plan: SNF    Current Diagnoses: Patient Active Problem List   Diagnosis Date Noted  . Acute respiratory failure with hypoxia (HCC) 09/15/2016  . Carpal tunnel syndrome 09/01/2016  . Hypertension 09/01/2016  . PVD (peripheral vascular disease) (HCC) 09/01/2016  . Open abdominal wall wound 09/01/2016  . Abscess 08/21/2016  . Type 2 diabetes mellitus with complication (HCC) 05/20/2015  . Visual disturbance 05/20/2015  . H/O: CVA (cerebrovascular accident) 01/21/2015  . Difficulty in walking 08/27/2014  . Diabetes (HCC) 08/20/2014  . Diabetic neuropathy (HCC) 08/20/2014  . Dizzy spells 08/20/2014  . Hand numbness 08/20/2014  . Obesity 08/20/2014    Orientation RESPIRATION BLADDER Height & Weight     Self  O2 (Nasal Cannula: 2L/min ) Continent Weight: 159 lb 12.8 oz (72.5 kg) Height:  5\' 2"  (157.5 cm)  BEHAVIORAL SYMPTOMS/MOOD NEUROLOGICAL BOWEL NUTRITION STATUS  Wanderer  (None. ) Continent Diet (Diet: Heart Healthy/Carb Modified )  AMBULATORY STATUS COMMUNICATION OF NEEDS Skin   Extensive Assist Verbally Surgical wounds (Incision: Mid Abdomen )    Wound type: Healing right lower quadrant abscess  Measurement: 1 cm x 2.7 cm x 0.2 cm Wound bed: 100% pink granulation tissue Drainage: no appreciable drainage noted; no  odor Periwound: intact skin Dressing procedure/placement/frequency: Please cleanse area with saline solution, pat dry. Cover with small foam dressing.  Change daily.                      Personal Care Assistance Level of Assistance  Bathing, Feeding, Dressing Bathing Assistance: Independent Feeding assistance: Limited assistance Dressing Assistance: Independent     Functional Limitations Info  Hearing, Speech, Sight Sight Info: Adequate Hearing Info: Adequate Speech Info: Impaired (Patial Plate )    SPECIAL CARE FACTORS FREQUENCY  PT (By licensed PT), OT (By licensed OT)     PT Frequency:  (5) OT Frequency:  (5)            Contractures      Additional Factors Info  Code Status, Allergies, Insulin Sliding Scale Code Status Info:  (Full Code ) Allergies Info:  (No Known Allergies )   Insulin Sliding Scale Info:  (NovoLog )       Current Medications (09/15/2016):  This is the current hospital active medication list Current Facility-Administered Medications  Medication Dose Route Frequency Provider Last Rate Last Dose  . 0.9 %  sodium chloride infusion  250 mL Intravenous PRN Alexis Hugelmeyer, DO      . acetaminophen (TYLENOL) tablet 650 mg  650 mg Oral Q4H PRN Alexis Hugelmeyer, DO      . aspirin EC tablet 81 mg  81 mg Oral 1 day or 1 dose Alexis Hugelmeyer, DO   81 mg at 09/15/16 0904  . atorvastatin (LIPITOR) tablet 10 mg  10 mg Oral Daily Alexis Hugelmeyer, DO   10  mg at 09/15/16 0904  . carvedilol (COREG) tablet 3.125 mg  3.125 mg Oral BID WC Alexis Hugelmeyer, DO   3.125 mg at 09/15/16 0903  . enoxaparin (LOVENOX) injection 40 mg  40 mg Subcutaneous QHS Alexis Hugelmeyer, DO      . furosemide (LASIX) injection 20 mg  20 mg Intravenous Daily Alexis Hugelmeyer, DO   20 mg at 09/15/16 0903  . gabapentin (NEURONTIN) capsule 100 mg  100 mg Oral Daily Alexis Hugelmeyer, DO   100 mg at 09/15/16 0904  . insulin aspart protamine- aspart (NOVOLOG MIX 70/30) injection  10 Units  10 Units Subcutaneous Q supper Alexis Hugelmeyer, DO      . insulin aspart protamine- aspart (NOVOLOG MIX 70/30) injection 38 Units  38 Units Subcutaneous Q breakfast Alexis Hugelmeyer, DO   38 Units at 09/15/16 0903  . lisinopril (PRINIVIL,ZESTRIL) tablet 2.5 mg  2.5 mg Oral Daily Alexis Hugelmeyer, DO   2.5 mg at 09/15/16 0903  . ondansetron (ZOFRAN) injection 4 mg  4 mg Intravenous Q6H PRN Alexis Hugelmeyer, DO      . potassium chloride SA (K-DUR,KLOR-CON) CR tablet 20 mEq  20 mEq Oral Daily Alexis Hugelmeyer, DO   20 mEq at 09/15/16 0904  . sodium chloride flush (NS) 0.9 % injection 3 mL  3 mL Intravenous Q12H Alexis Hugelmeyer, DO   3 mL at 09/15/16 0904  . sodium chloride flush (NS) 0.9 % injection 3 mL  3 mL Intravenous PRN Alexis Hugelmeyer, DO      . [START ON 09/16/2016] Vitamin D (Ergocalciferol) (DRISDOL) capsule 50,000 Units  50,000 Units Oral Weekly Alexis Hugelmeyer, DO         Discharge Medications: Please see discharge summary for a list of discharge medications.  Relevant Imaging Results:  Relevant Lab Results:   Additional Information  (SSN: [id number])  Ralene BatheMackenzie Jayziah Bankhead, Student-Social Work

## 2016-09-15 NOTE — Progress Notes (Signed)
  Echocardiogram 2D Echocardiogram has been performed.  [name] SavoyCasey N Marquasha [name] 09/15/2016, 3:39 PM

## 2016-09-15 NOTE — Evaluation (Signed)
Physical Therapy Evaluation Patient Details Name: [name] [name] MRN: [id number] DOB: [birth year] Today's Date: 09/15/2016   History of Present Illness  This is a 82 year old white female with a past medical history of CHF CVA diabetes states that she was at home yesterday and all of a sudden became very short of breath and hypoxic and slurred speech. She did not have any chest pain and was found to be in in respiratory failure and was placed on BiPAP. Pt is now on nasal cannula. Pt reports multiple falls in the last 12 months. She is AOx1 at time of evaluation with cues for place. Disoriented to situation. Sister supplements history. Sister has difficulty describing patient's baseline level of cognition.  Clinical Impression  Pt admitted with above diagnosis. Pt currently with functional limitations due to the deficits listed below (see PT Problem List). Pt requires assist for bed mobility, transfers, and limited ambulation. She is weak and limited in her mobility compared to baseline. Pt only able to ambulate very short distance and becomes fatigued very quickly. She will need to return to SNF at discharge in order to facilitate safe return to prior level of function at home. Pt will benefit from skilled PT services to address deficits in strength, balance, and mobility in order to return to full function at home.     Follow Up Recommendations SNF    Equipment Recommendations  None recommended by PT    Recommendations for Other Services       Precautions / Restrictions Precautions Precautions: Fall Restrictions Weight Bearing Restrictions: No      Mobility  Bed Mobility Overal bed mobility: Needs Assistance Bed Mobility: Supine to Sit;Sit to Supine     Supine to sit: Min assist;HOB elevated Sit to supine: Min assist   General bed mobility comments: Pt performed log roll with Mod I; minA+1 to go from R sidelying to sitting . Maintains balance sitting at EOB without UE support.  MinA+1 to return legs back to bed when laying down.   Transfers Overall transfer level: Needs assistance Equipment used: Rolling walker (2 wheeled) Transfers: Sit to/from Stand Sit to Stand: Mod assist (Attempted with 1; unable to attain stand)         General transfer comment: Pt with heavy posterior leaning during transfer. Cues for hand placement and weight shifting. Once upright pt requires cues for postur but able to remain standing with CGA only.   Ambulation/Gait Ambulation/Gait assistance: Min assist Ambulation Distance (Feet): 6 Feet Assistive device: Rolling walker (2 wheeled) Gait Pattern/deviations: Decreased step length - right;Decreased step length - left Gait velocity: decreased Gait velocity interpretation: <1.8 ft/sec, indicative of risk for recurrent falls General Gait Details: Pt takes short shuffling steps forward and backwards at EOB. Pt with posterior leaning requiring minA+1 to maintain posture. Pt with high fear of falling and LE weakness. Once she returns back to bed she has an uncontrolled descent back into sitting before therapist has time to instruct her in safe sequencing  Stairs            Wheelchair Mobility    Modified Rankin (Stroke Patients Only)       Balance Overall balance assessment: Needs assistance Sitting-balance support: No upper extremity supported Sitting balance-Leahy Scale: Fair     Standing balance support: Bilateral upper extremity supported Standing balance-Leahy Scale: Poor Standing balance comment: Frequires minA+1 to maintain upright posture due to posterior leaning  Pertinent Vitals/Pain Pain Assessment: No/denies pain    Home Living Family/patient expects to be discharged to:: Skilled nursing facility Living Arrangements: Alone (Pt's daughter typically lives with pt. Pt recently broke her leg and is unable to help the patient) Available Help at Discharge: Family Type of  Home: House Home Access: Stairs to enter Entrance Stairs-Rails: Right Entrance Stairs-Number of Steps: 6 Home Layout: One level Home Equipment: Wheelchair - [employer] Corporationmanual;Walker - 2 wheels      Prior Function Level of Independence: Independent with assistive device(s)         Comments: Pt reports using w/c last few weeks d/t too weak to walk.  Pt reports having a home health aide but she only comes 1x/month to assist with groceries etc and pt reports having difficulty managing at home now and family has had to help.     Hand Dominance   Dominant Hand: Right    Extremity/Trunk Assessment   Upper Extremity Assessment: RUE deficits/detail;LUE deficits/detail RUE Deficits / Details: R shoulder flexion 4+/5; R elbow flexion/extension 4/5; good grip strength     LUE Deficits / Details: L shoulder flexion 4/5; L elbow flexion/extension 4/5; decreased L grip strength compared to R (residual from prior stroke)   Lower Extremity Assessment: RLE deficits/detail;LLE deficits/detail RLE Deficits / Details: R hip flexion, knee flexion/extension and DF at least 4/5 LLE Deficits / Details: L hip flexion 3+/5; L knee flexion/extension 4/5; L DF 4-/5     Communication   Communication: No difficulties  Cognition Arousal/Alertness: Awake/alert Behavior During Therapy: WFL for tasks assessed/performed Overall Cognitive Status: Difficult to assess       Memory: Decreased short-term memory              General Comments      Exercises General Exercises - Lower Extremity Ankle Circles/Pumps: AROM;Strengthening;Both;10 reps;Supine Heel Slides: Strengthening;Both;10 reps;Supine Hip ABduction/ADduction: Strengthening;Both;10 reps;Supine Straight Leg Raises: Strengthening;Both;10 reps;Supine   Assessment/Plan    PT Assessment Patient needs continued PT services  PT Problem List Decreased strength;Decreased activity tolerance;Decreased balance;Decreased mobility          PT Treatment  Interventions DME instruction;Gait training;Stair training;Functional mobility training;Therapeutic activities;Therapeutic exercise;Balance training;Patient/family education;Neuromuscular re-education    PT Goals (Current goals can be found in the Care Plan section)  Acute Rehab PT Goals Patient Stated Goal: Improve independence at home PT Goal Formulation: With patient/family Time For Goal Achievement: 09/29/16 Potential to Achieve Goals: Fair    Frequency Min 2X/week   Barriers to discharge Decreased caregiver support Lives with daughter but daughter recently broke her leg and cannot help care for her    Co-evaluation               End of Session Equipment Utilized During Treatment: Gait belt Activity Tolerance: Patient limited by fatigue Patient left: with call bell/phone within reach;with family/visitor present;in bed;with bed alarm set;with nursing/sitter in room (Lab tech in room) Nurse Communication: Mobility status         Time: 1440-1510 PT Time Calculation (min) (ACUTE ONLY): 30 min   Charges:         PT G Codes:       Sharalyn InkJason D Terissa Haffey PT, DPT   Ladell Lea 09/15/2016, 3:23 PM

## 2016-09-15 NOTE — Progress Notes (Signed)
[facility] - [place] at [facility]   PATIENT NAME: [name] [name]    MR#:  [id number]  DATE OF BIRTH:  1934-08-03  SUBJECTIVE:seen at bedside.admitted for sob,chf exacerbation.no chest pain but she says she is still sob.,  CHIEF COMPLAINT:   Chief Complaint  Patient presents with  . Respiratory Distress    REVIEW OF SYSTEMS:   ROS CONSTITUTIONAL: No fever, fatigue or weakness.  EYES: No blurred or double vision.  EARS, NOSE, AND THROAT: No tinnitus or ear pain.  RESPIRATORY:  Has sob.no wheezing or hemoptysis.  CARDIOVASCULAR: No chest pain, orthopnea, edema.  GASTROINTESTINAL: No nausea, vomiting, diarrhea or abdominal pain.  GENITOURINARY: No dysuria, hematuria.  ENDOCRINE: No polyuria, nocturia,  HEMATOLOGY: No anemia, easy bruising or bleeding SKIN: No rash or lesion. MUSCULOSKELETAL: No joint pain or arthritis.   NEUROLOGIC: No tingling, numbness, weakness.  PSYCHIATRY: No anxiety or depression.   DRUG ALLERGIES:  No Known Allergies  VITALS:  Blood pressure 127/61, pulse 85, temperature 98.1 F (36.7 C), temperature source Oral, resp. rate (!) 22, height 5\' 2"  (1.575 m), weight 72.5 kg (159 lb 12.8 oz), SpO2 100 %.  PHYSICAL EXAMINATION:  GENERAL:  82 y.o.-year-old patient lying in the bed with no acute distress.  EYES: Pupils equal, round, reactive to light and accommodation. No scleral icterus. Extraocular muscles intact.  HEENT: Head atraumatic, normocephalic. Oropharynx and nasopharynx clear.  NECK:  Supple, no jugular venous distention. No thyroid enlargement, no tenderness.  LUNGS: Normal breath sounds bilaterally, no wheezing, rales,rhonchi or crepitation. No use of accessory muscles of respiration.  CARDIOVASCULAR: S1, S2 normal. No murmurs, rubs, or gallops.  ABDOMEN: Soft, nontender, nondistended. Bowel sounds present. No organomegaly or mass.  EXTREMITIES:  Mild pedal edema.no. cyanosis, or clubbing.  NEUROLOGIC: Cranial  nerves II through XII are intact. Muscle strength 5/5 in all extremities. Sensation intact. Gait not checked.  PSYCHIATRIC: The patient is alert and oriented x 3.  SKIN: No obvious rash, lesion, or ulcer.    LABORATORY PANEL:   CBC  Recent Labs Lab 09/14/16 2158  WBC 6.2  HGB 11.9*  HCT 35.4  PLT 209   ------------------------------------------------------------------------------------------------------------------  Chemistries   Recent Labs Lab 09/14/16 2158  NA 139  K 4.2  CL 105  CO2 28  GLUCOSE 348*  BUN 24*  CREATININE 1.39*  CALCIUM 8.9  AST 29  ALT 13*  ALKPHOS 92  BILITOT 0.7   ------------------------------------------------------------------------------------------------------------------  Cardiac Enzymes  Recent Labs Lab 09/15/16 1510  TROPONINI 0.08*   ------------------------------------------------------------------------------------------------------------------  RADIOLOGY:  Dg Chest 2 View  Result Date: 09/15/2016 CLINICAL DATA:  History of CHF, diabetes, and previous CVA. Episode of shortness of breath and chest pain last night. Former smoker. EXAM: CHEST  2 VIEW COMPARISON:  Portable chest x-ray of September 14, 2016 FINDINGS: The lungs are reasonably well inflated. Bilateral pleural effusions are again demonstrated. The pulmonary interstitial markings are increased bilaterally. The cardiac silhouette is enlarged but its margins are obscured by the left pleural effusion. There is tortuosity of the ascending and descending thoracic aorta with mural calcification. The bony thorax is unremarkable. IMPRESSION: CHF with pulmonary interstitial edema and bilateral pleural effusions. Electronically Signed   By: David  [place] M.D.   On: 09/15/2016 08:01   Dg Chest Portable 1 View  Result Date: 09/14/2016 CLINICAL DATA:  82 year old female with shortness of breath EXAM: PORTABLE CHEST 1 VIEW COMPARISON:  Chest radiograph dated 08/22/2016 FINDINGS: Single  portable view of the chest demonstrates central vascular  and interstitial prominence compatible with congestive changes. Small bilateral pleural effusions noted. Bilateral mid to lower lung field hazy densities may be related to congestive changes or atelectasis versus infiltrate. There is no pneumothorax. Stable appearing cardiac silhouette. No acute osseous pathology. IMPRESSION: Congestive changes with small bilateral pleural effusions. Pneumonia is not excluded. Clinical correlation and follow-up is recommended. Electronically Signed   By: Elgie CollardArash  Radparvar M.D.   On: 09/14/2016 23:31    EKG:   Orders placed or performed in visit on 09/14/16  . EKG 12-Lead    ASSESSMENT AND PLAN:   1.82 yr old female with acute sob /acute on chronic diastolic chf;continue lasix,ace,coreg.Marland Kitchen. 2.DM;II;continue novolog 70/30 with coverage. And novolog S scale correction 3.h/o CVA;PT recommends SNF      All the records are reviewed and case discussed with Care Management/Social Workerr. Management plans discussed with the patient, family and they are in agreement.  CODE STATUS: full  TOTAL TIME TAKING CARE OF THIS PATIENT: 35 minutes.   POSSIBLE D/C IN 1-2 DAYS, DEPENDING ON CLINICAL CONDITION.   Katha HammingKONIDENA,Mahaley Schwering M.D on 09/15/2016 at 6:53 PM  Between 7am to 6pm - Pager - [phone]  After 6pm go to [email or web address] - password [email or web address]  [facility]  Office  [phone]  CC: Primary care physician; Barbette ReichmannHANDE,VISHWANATH, MD   Note: This dictation was prepared with Dragon dictation along with smaller phrase technology. Any transcriptional errors that result from this process are unintentional.

## 2016-09-16 ENCOUNTER — Ambulatory Visit: Payer: PPO | Admitting: Surgery

## 2016-09-16 LAB — BASIC METABOLIC PANEL
ANION GAP: 5 (ref 5–15)
BUN: 43 mg/dL — ABNORMAL HIGH (ref 6–20)
CALCIUM: 8.5 mg/dL — AB (ref 8.9–10.3)
CHLORIDE: 105 mmol/L (ref 101–111)
CO2: 31 mmol/L (ref 22–32)
Creatinine, Ser: 1.52 mg/dL — ABNORMAL HIGH (ref 0.44–1.00)
GFR calc non Af Amer: 31 mL/min — ABNORMAL LOW (ref >60)
GFR, EST AFRICAN AMERICAN: 36 mL/min — AB (ref >60)
Glucose, Bld: 130 mg/dL — ABNORMAL HIGH (ref 65–99)
Potassium: 3.7 mmol/L (ref 3.5–5.1)
SODIUM: 141 mmol/L (ref 135–145)

## 2016-09-16 LAB — GLUCOSE, CAPILLARY
GLUCOSE-CAPILLARY: 136 mg/dL — AB (ref 65–99)
Glucose-Capillary: 202 mg/dL — ABNORMAL HIGH (ref 65–99)
Glucose-Capillary: 145 mg/dL — ABNORMAL HIGH (ref 65–99)
Glucose-Capillary: 188 mg/dL — ABNORMAL HIGH (ref 65–99)

## 2016-09-16 MED ORDER — INSULIN ASPART PROT & ASPART (70-30 MIX) 100 UNIT/ML ~~LOC~~ SUSP
15.0000 [IU] | Freq: Every day | SUBCUTANEOUS | Status: DC
  Administered 2016-09-16: 15 [IU] via SUBCUTANEOUS
  Filled 2016-09-16: qty 15

## 2016-09-16 MED ORDER — FUROSEMIDE 20 MG PO TABS
20.0000 mg | ORAL_TABLET | Freq: Every day | ORAL | Status: DC
  Administered 2016-09-16 – 2016-09-17 (×2): 20 mg via ORAL
  Filled 2016-09-16 (×2): qty 1

## 2016-09-16 MED ORDER — INSULIN ASPART PROT & ASPART (70-30 MIX) 100 UNIT/ML ~~LOC~~ SUSP
42.0000 [IU] | Freq: Every day | SUBCUTANEOUS | Status: DC
  Administered 2016-09-17: 42 [IU] via SUBCUTANEOUS
  Filled 2016-09-16: qty 42

## 2016-09-16 NOTE — Progress Notes (Signed)
[facility] - [place] at [facility]   PATIENT NAME: [name] [name]    MR#:  [id number]  DATE OF BIRTH:  04-05-1934  SUBJECTIVE:seen at bedside.admitted for sob,chf exacerbation.better than yesterday.sob  Better today,  CHIEF COMPLAINT:   Chief Complaint  Patient presents with  . Respiratory Distress    REVIEW OF SYSTEMS:   ROS CONSTITUTIONAL: No fever, fatigue or weakness.  EYES: No blurred or double vision.  EARS, NOSE, AND THROAT: No tinnitus or ear pain.  RESPIRATORY:  Has sob.no wheezing or hemoptysis.  CARDIOVASCULAR: No chest pain, orthopnea, edema.  GASTROINTESTINAL: No nausea, vomiting, diarrhea or abdominal pain.  GENITOURINARY: No dysuria, hematuria.  ENDOCRINE: No polyuria, nocturia,  HEMATOLOGY: No anemia, easy bruising or bleeding SKIN: No rash or lesion. MUSCULOSKELETAL: No joint pain or arthritis.   NEUROLOGIC: No tingling, numbness, weakness.  PSYCHIATRY: No anxiety or depression.   DRUG ALLERGIES:  No Known Allergies  VITALS:  Blood pressure (!) 95/37, pulse 70, temperature 98.2 F (36.8 C), temperature source Oral, resp. rate 17, height 5\' 2"  (1.575 m), weight 72.3 kg (159 lb 4.8 oz), SpO2 100 %.  PHYSICAL EXAMINATION:  GENERAL:  82 y.o.-year-old patient lying in the bed with no acute distress.  EYES: Pupils equal, round, reactive to light and accommodation. No scleral icterus. Extraocular muscles intact.  HEENT: Head atraumatic, normocephalic. Oropharynx and nasopharynx clear.  NECK:  Supple, no jugular venous distention. No thyroid enlargement, no tenderness.  LUNGS: Normal breath sounds bilaterally, no wheezing, rales,rhonchi or crepitation. No use of accessory muscles of respiration.  CARDIOVASCULAR: S1, S2 normal. No murmurs, rubs, or gallops.  ABDOMEN: Soft, nontender, nondistended. Bowel sounds present. No organomegaly or mass.  EXTREMITIES:  Mild pedal edema.no. cyanosis, or clubbing.  NEUROLOGIC: Cranial nerves II  through XII are intact. Muscle strength 5/5 in all extremities. Sensation intact. Gait not checked.  PSYCHIATRIC: The patient is alert and oriented x 3.  SKIN: No obvious rash, lesion, or ulcer.    LABORATORY PANEL:   CBC  Recent Labs Lab 09/14/16 2158  WBC 6.2  HGB 11.9*  HCT 35.4  PLT 209   ------------------------------------------------------------------------------------------------------------------  Chemistries   Recent Labs Lab 09/14/16 2158 09/16/16 0511  NA 139 141  K 4.2 3.7  CL 105 105  CO2 28 31  GLUCOSE 348* 130*  BUN 24* 43*  CREATININE 1.39* 1.52*  CALCIUM 8.9 8.5*  AST 29  --   ALT 13*  --   ALKPHOS 92  --   BILITOT 0.7  --    ------------------------------------------------------------------------------------------------------------------  Cardiac Enzymes  Recent Labs Lab 09/15/16 1510  TROPONINI 0.08*   ------------------------------------------------------------------------------------------------------------------  RADIOLOGY:  Dg Chest 2 View  Result Date: 09/15/2016 CLINICAL DATA:  History of CHF, diabetes, and previous CVA. Episode of shortness of breath and chest pain last night. Former smoker. EXAM: CHEST  2 VIEW COMPARISON:  Portable chest x-ray of September 14, 2016 FINDINGS: The lungs are reasonably well inflated. Bilateral pleural effusions are again demonstrated. The pulmonary interstitial markings are increased bilaterally. The cardiac silhouette is enlarged but its margins are obscured by the left pleural effusion. There is tortuosity of the ascending and descending thoracic aorta with mural calcification. The bony thorax is unremarkable. IMPRESSION: CHF with pulmonary interstitial edema and bilateral pleural effusions. Electronically Signed   By: David  SwazilandJordan M.D.   On: 09/15/2016 08:01   Dg Chest Portable 1 View  Result Date: 09/14/2016 CLINICAL DATA:  82 year old female with shortness of breath EXAM: PORTABLE CHEST 1  VIEW  COMPARISON:  Chest radiograph dated 08/22/2016 FINDINGS: Single portable view of the chest demonstrates central vascular and interstitial prominence compatible with congestive changes. Small bilateral pleural effusions noted. Bilateral mid to lower lung field hazy densities may be related to congestive changes or atelectasis versus infiltrate. There is no pneumothorax. Stable appearing cardiac silhouette. No acute osseous pathology. IMPRESSION: Congestive changes with small bilateral pleural effusions. Pneumonia is not excluded. Clinical correlation and follow-up is recommended. Electronically Signed   By: Elgie Collard M.D.   On: 09/14/2016 23:31    EKG:   Orders placed or performed in visit on 09/14/16  . EKG 12-Lead  . EKG 12-Lead  . EKG 12-Lead    ASSESSMENT AND PLAN:   1.82 yr old female with acute sob /acute on chronic diastolic chf;continue lasix,ace,coreg...renal function slightly worse today,so  Monitor in hospital today ,check BMP am.change iv lasix to PO,ef 65%.follow  With cardio as an out pt for lexiscan stress test. 2.DM;II;continue novolog 70/30  42 units daily,15 units daily at supper. 3.h/o CVA;PT recommends SNF,can go back to [facility] tomorrow,      All the records are reviewed and case discussed with Care Management/Social Workerr. Management plans discussed with the patient, family and they are in agreement.  CODE STATUS: full  TOTAL TIME TAKING CARE OF THIS PATIENT: 35 minutes.   POSSIBLE D/C IN 1-2 DAYS, DEPENDING ON CLINICAL CONDITION.   Katha Hamming M.D on 09/16/2016 at 3:59 PM  Between 7am to 6pm - Pager - [phone]  After 6pm go to [email or web address] - password [email or web address]  [facility]  Office  [phone]  CC: Primary care physician; Barbette Reichmann, MD   Note: This dictation was prepared with Dragon dictation along with smaller phrase technology. Any transcriptional errors that result from this process are unintentional.

## 2016-09-16 NOTE — Progress Notes (Signed)
SUBJECTIVE: [name] feeling better   Vitals:   09/15/16 0336 09/15/16 1150 09/15/16 2008 09/16/16 0434  BP: (!) 167/83 127/61 (!) 115/47 (!) 114/51  Pulse: 95 85 88 73  Resp: 20 (!) 22 15 18   Temp: 98.2 F (36.8 C) 98.1 F (36.7 C) 99.3 F (37.4 C) 98.1 F (36.7 C)  TempSrc: Oral Oral Oral Oral  SpO2: 98% 100% 98% 99%  Weight: 159 lb 12.8 oz (72.5 kg)   159 lb 4.8 oz (72.3 kg)  Height: 5\' 2"  (1.575 m)       Intake/Output Summary (Last 24 hours) at 09/16/16 0841 Last data filed at 09/15/16 2217  Gross per 24 hour  Intake              243 ml  Output                1 ml  Net              242 ml    LABS: Basic Metabolic Panel:  Recent Labs  16/09/9608/27/17 2158 09/16/16 0511  NA 139 141  K 4.2 3.7  CL 105 105  CO2 28 31  GLUCOSE 348* 130*  BUN 24* 43*  CREATININE 1.39* 1.52*  CALCIUM 8.9 8.5*   Liver Function Tests:  Recent Labs  09/14/16 2158  AST 29  ALT 13*  ALKPHOS 92  BILITOT 0.7  PROT 8.1  ALBUMIN 3.1*   No results for input(s): LIPASE, AMYLASE in the last 72 hours. CBC:  Recent Labs  09/14/16 2158  WBC 6.2  NEUTROABS 2.2  HGB 11.9*  HCT 35.4  MCV 93.3  PLT 209   Cardiac Enzymes:  Recent Labs  09/15/16 0403 09/15/16 1010 09/15/16 1510  TROPONINI 0.08* 0.08* 0.08*   BNP: Invalid input(s): POCBNP D-Dimer: No results for input(s): DDIMER in the last 72 hours. Hemoglobin A1C: No results for input(s): HGBA1C in the last 72 hours. Fasting Lipid Panel: No results for input(s): CHOL, HDL, LDLCALC, TRIG, CHOLHDL, LDLDIRECT in the last 72 hours. Thyroid Function Tests:  Recent Labs  09/15/16 0403  TSH 0.586   Anemia Panel: No results for input(s): VITAMINB12, FOLATE, FERRITIN, TIBC, IRON, RETICCTPCT in the last 72 hours.   PHYSICAL EXAM General: Well developed, well nourished, in no acute distress HEENT:  Normocephalic and atramatic Neck:  No JVD.  Lungs: Clear bilaterally to auscultation and percussion. Heart: HRRR . Normal S1  and S2 without gallops or murmurs.  Abdomen: Bowel sounds are positive, abdomen soft and non-tender  Msk:  Back normal, normal gait. Normal strength and tone for age. Extremities: No clubbing, cyanosis or edema.   Neuro: Alert and oriented X 3. Psych:  Good affect, responds appropriately  TELEMETRY:Sinus rhythm  ASSESSMENT AND PLAN: Congestive heart failure due to diastolic dysfunction with left ventricular ejection fraction 65% but grade 1 diastolic dysfunction. Patient can go home with follow-up in the office on Tuesday at 10:00 and will do outpatient Lexiscan Myoview to rule out coronary artery disease. Corag can be treated discontinued breast of the medications can be continued. Lasix can be changed to by mouth.  Active Problems:   Acute respiratory failure with hypoxia (HCC)    [name] [name],[name] Hibler A, MD, [facility] 09/16/2016 8:41 AM

## 2016-09-16 NOTE — Care Management (Signed)
[facility] where she is under a short term skilled nursing plan of care. It is anticipated that she will return to the facility when medically stable.  Current 02 requirement is acute

## 2016-09-16 NOTE — Progress Notes (Signed)
Physical Therapy Treatment Patient Details Name: [name] [name] MRN: [id number] DOB: 1934-10-04 Today's Date: 09/16/2016    History of Present Illness This is a 82 year old white female with a past medical history of CHF CVA diabetes states that she was at home yesterday and all of a sudden became very short of breath and hypoxic and slurred speech. She did not have any chest pain and was found to be in in respiratory failure and was placed on BiPAP. Pt is now on nasal cannula. Pt reports multiple falls in the last 12 months. She is AOx1 at time of evaluation with cues for place. Disoriented to situation. Sister supplements history. Sister has difficulty describing patient's baseline level of cognition.    PT Comments    Pt in bed and agrees to session.  Participated in exercises as described below.  Pt stood 4 times at bedside with overall heavy posterior lean and using bed to support.  She required writer to push down on walker to provide her with a more solid support and when pressure on walker was let up, she would fall backwards.  She was able to take small steps marching in place and some forward and backwards near bed but with poor balance and a high fall risk.  Further gait was deferred and session focused on gaining balance in standing with marching.  She repeated several times that her legs felt weak and unsteady and that she did not trust them with standing mobility.  She also had some complaints of dizziness with mobility and head movements.  O2 at 2 LPM and sats remained 100% at rest and with activity.   Follow Up Recommendations  SNF     Equipment Recommendations  None recommended by PT    Recommendations for Other Services       Precautions / Restrictions Precautions Precautions: Fall Precaution Comments: Abdominal incision Restrictions Weight Bearing Restrictions: No    Mobility  Bed Mobility Overal bed mobility: Needs Assistance Bed Mobility: Supine to Sit;Sit to  Supine     Supine to sit: Min assist;HOB elevated Sit to supine: Min assist   General bed mobility comments: increased time and awkward movements at times as she struggled with techniques and hand placements  Transfers Overall transfer level: Needs assistance Equipment used: Rolling walker (2 wheeled) Transfers: Sit to/from Stand Sit to Stand: Mod assist         General transfer comment:  (posterior leand with legs braced on bed)  Ambulation/Gait Ambulation/Gait assistance: Min assist Ambulation Distance (Feet): 3 Feet (forward and backwards near bed) Assistive device: Rolling walker (2 wheeled) Gait Pattern/deviations: Decreased step length - right;Decreased step length - left;Shuffle Gait velocity: decreased Gait velocity interpretation: <1.8 ft/sec, indicative of risk for recurrent falls General Gait Details: posterior lean which required assistance from writer to push down through walker to prevent post LOB   Stairs            Wheelchair Mobility    Modified Rankin (Stroke Patients Only)       Balance Overall balance assessment: Needs assistance Sitting-balance support: Feet supported Sitting balance-Leahy Scale: Fair     Standing balance support: Bilateral upper extremity supported Standing balance-Leahy Scale: Poor                      Cognition Arousal/Alertness: Awake/alert Behavior During Therapy: WFL for tasks assessed/performed Overall Cognitive Status: Difficult to assess       Memory: Decreased short-term memory  Exercises General Exercises - Lower Extremity Ankle Circles/Pumps: AROM;Strengthening;Both;10 reps;Supine Long Arc Quad: AROM;Both;20 reps;Seated Heel Slides: Strengthening;Both;10 reps;Supine Hip ABduction/ADduction: Strengthening;Both;10 reps;Supine Straight Leg Raises: Strengthening;Both;10 reps;Supine Hip Flexion/Marching: AROM;Both;20 reps;Seated    General Comments        Pertinent  Vitals/Pain Pain Assessment: No/denies pain Pain Intervention(s): Monitored during session    Home Living                      Prior Function            PT Goals (current goals can now be found in the care plan section) Progress towards PT goals: Progressing toward goals    Frequency    Min 2X/week      PT Plan Current plan remains appropriate    Co-evaluation             End of Session Equipment Utilized During Treatment: Gait belt Activity Tolerance: Patient limited by fatigue Patient left: with call bell/phone within reach;with family/visitor present;in bed;with bed alarm set;with nursing/sitter in room     Time: 1520-1555 PT Time Calculation (min) (ACUTE ONLY): 35 min  Charges:  $Therapeutic Exercise: 8-22 mins $Therapeutic Activity: 8-22 mins                    G Codes:      Danielle DessSarah Donice Alperin, PTA 09/16/16, 5:12 PM

## 2016-09-16 NOTE — Progress Notes (Signed)
Inpatient Diabetes Program Recommendations  AACE/ADA: New Consensus Statement on Inpatient Glycemic Control (2015)  Target Ranges:  Prepandial:   less than 140 mg/dL      [facility] postprandial:   less than 180 mg/dL (1-2 hours)      Critically ill patients:  140 - 180 mg/dL   Results for [name] [name], [name] (MRN [id number]) as of 09/16/2016 08:20  Ref. Range 09/15/2016 18:16 09/15/2016 19:34 09/15/2016 22:05 09/16/2016 07:33  Glucose-Capillary Latest Ref Range: 65 - 99 mg/dL 409484 (H) Novolog 12 units 70/30 10 units 447 (H) Novolog 10 units  348 (H) Novolog 4 units 136 (H)   A1C on 08/22/16 (= average CBG of 214 mg/dL)   Review of Glycemic Control  Diabetes history: DM2 Outpatient Diabetes medications:Novolog Mix 70/30 38 units daily with breakfast, Novolog Mix 70/30 10 units daily with supper  Current orders for Inpatient glycemic control: Novolog Mix 70/30 38 units daily with breakfast, Novolog Mix 70/30 10 units daily with supper, Novolog Sensitive Correction TID with meals.  Inpatient Diabetes Program Recommendations:  Please consider increasing Novolog Mix 70/30 to 42 units daily at breakfast and 15 units daily at supper.  Thank you,  Kristine LineaKaren Matalyn Nawaz, RN, BSN Diabetes Coordinator Inpatient Diabetes Program [phone] (Team Pager) [phone] ([facility] office) [phone] [facility] office) [phone] [facility] office)

## 2016-09-16 NOTE — Care Management Important Message (Signed)
Important Message  Patient Details  Name: [name] [name] MRN: [id number] Date of Birth: 15-May-1934   Medicare Important Message Given:  Yes    Eber HongGreene, Silvano Garofano R, RN 09/16/2016, 11:52 AM

## 2016-09-17 DIAGNOSIS — E785 Hyperlipidemia, unspecified: Secondary | ICD-10-CM | POA: Diagnosis not present

## 2016-09-17 DIAGNOSIS — I509 Heart failure, unspecified: Secondary | ICD-10-CM | POA: Diagnosis not present

## 2016-09-17 DIAGNOSIS — E876 Hypokalemia: Secondary | ICD-10-CM | POA: Diagnosis not present

## 2016-09-17 DIAGNOSIS — G629 Polyneuropathy, unspecified: Secondary | ICD-10-CM | POA: Diagnosis not present

## 2016-09-17 DIAGNOSIS — L02211 Cutaneous abscess of abdominal wall: Secondary | ICD-10-CM | POA: Diagnosis not present

## 2016-09-17 DIAGNOSIS — I10 Essential (primary) hypertension: Secondary | ICD-10-CM | POA: Diagnosis not present

## 2016-09-17 DIAGNOSIS — E119 Type 2 diabetes mellitus without complications: Secondary | ICD-10-CM | POA: Diagnosis not present

## 2016-09-17 DIAGNOSIS — R739 Hyperglycemia, unspecified: Secondary | ICD-10-CM | POA: Diagnosis not present

## 2016-09-17 DIAGNOSIS — I679 Cerebrovascular disease, unspecified: Secondary | ICD-10-CM | POA: Diagnosis not present

## 2016-09-17 DIAGNOSIS — N179 Acute kidney failure, unspecified: Secondary | ICD-10-CM | POA: Diagnosis not present

## 2016-09-17 DIAGNOSIS — N390 Urinary tract infection, site not specified: Secondary | ICD-10-CM | POA: Diagnosis not present

## 2016-09-17 DIAGNOSIS — M6281 Muscle weakness (generalized): Secondary | ICD-10-CM | POA: Diagnosis not present

## 2016-09-17 DIAGNOSIS — J9601 Acute respiratory failure with hypoxia: Secondary | ICD-10-CM | POA: Diagnosis not present

## 2016-09-17 DIAGNOSIS — Z5189 Encounter for other specified aftercare: Secondary | ICD-10-CM | POA: Diagnosis not present

## 2016-09-17 DIAGNOSIS — E784 Other hyperlipidemia: Secondary | ICD-10-CM | POA: Diagnosis not present

## 2016-09-17 LAB — BASIC METABOLIC PANEL
Anion gap: 5 (ref 5–15)
BUN: 51 mg/dL — AB (ref 6–20)
CHLORIDE: 104 mmol/L (ref 101–111)
CO2: 31 mmol/L (ref 22–32)
CREATININE: 1.55 mg/dL — AB (ref 0.44–1.00)
Calcium: 8.5 mg/dL — ABNORMAL LOW (ref 8.9–10.3)
GFR calc Af Amer: 35 mL/min — ABNORMAL LOW (ref >60)
GFR calc non Af Amer: 30 mL/min — ABNORMAL LOW (ref >60)
Glucose, Bld: 193 mg/dL — ABNORMAL HIGH (ref 65–99)
POTASSIUM: 4.3 mmol/L (ref 3.5–5.1)
Sodium: 140 mmol/L (ref 135–145)

## 2016-09-17 LAB — GLUCOSE, CAPILLARY
GLUCOSE-CAPILLARY: 122 mg/dL — AB (ref 65–99)
Glucose-Capillary: 150 mg/dL — ABNORMAL HIGH (ref 65–99)

## 2016-09-17 MED ORDER — INSULIN ASPART PROT & ASPART (70-30 MIX) 100 UNIT/ML ~~LOC~~ SUSP: 15.0000 [IU] | Freq: Every day | SUBCUTANEOUS | 11 refills | Status: DC

## 2016-09-17 MED ORDER — CARVEDILOL 3.125 MG PO TABS: 3.1250 mg | ORAL_TABLET | Freq: Two times a day (BID) | ORAL | 0 refills | Status: AC

## 2016-09-17 MED ORDER — LISINOPRIL 2.5 MG PO TABS: 2.5000 mg | ORAL_TABLET | Freq: Every day | ORAL | 0 refills | Status: AC

## 2016-09-17 MED ORDER — FUROSEMIDE 20 MG PO TABS: 20.0000 mg | ORAL_TABLET | Freq: Every day | ORAL | 0 refills | Status: DC

## 2016-09-17 MED ORDER — ENOXAPARIN SODIUM 30 MG/0.3ML ~~LOC~~ SOLN: 30.0000 mg | Freq: Every day | SUBCUTANEOUS | Status: DC

## 2016-09-17 NOTE — Progress Notes (Signed)
Patient discharged via wheelchair and private vehicle. IV removed and catheter intact. All discharge instructions given and patient verbalizes understanding. Tele removed and returned. No prescriptions given to patient No distress noted. Report called to [facility] no questions at this time. Brother is here to pick her up

## 2016-09-17 NOTE — Discharge Summary (Signed)
[name] [name], is a 82 y.o. female  DOB 03/16/1934  MRN [id number].  Admission date:  09/14/2016  Admitting Physician  Tonye Royalty, DO  Discharge Date:  09/17/2016   Primary MD  Barbette Reichmann, MD  Recommendations for primary care physician for things to follow:   Follow up with primary doctor in 1 week   Admission Diagnosis  CHF (congestive heart failure) (HCC) [I50.9] Hypoxia [R09.02] Congestive heart failure, unspecified congestive heart failure chronicity, unspecified congestive heart failure type (HCC) [I50.9]   Discharge Diagnosis  CHF (congestive heart failure) (HCC) [I50.9] Hypoxia [R09.02] Congestive heart failure, unspecified congestive heart failure chronicity, unspecified congestive heart failure type (HCC) [I50.9]    Active Problems:   Acute respiratory failure with hypoxia [facility])      Past Medical History:  Diagnosis Date  . Diabetes mellitus without complication (HCC)   . Hypertension   . Stroke [facility])     History reviewed. No pertinent surgical history.     History of present illness and  Hospital Course:     Kindly see H&P for history of present illness and admission details, please review complete Labs, Consult reports and Test reports for all details in brief  HPI  from the history and physical done on the day of admission 82 year old female patient with history of fall CVA, diabetes mellitus, congestive heart failure comes in because of shortness of breath. No chest pain. Initially required BiPAP in the emergency room. Admitted to telemetry because of CHF exacerbation.   Hospital Course  #1 acute on chronic diastolic heart failure: Started on IV Lasix, seen by cardiology, establish showed a pleural effusion bilaterally. Patient also started on Coreg, lisinopril. A cardiogram  showed EF of 65%.[name] [name] for [facility] today. Plan # 2 CKD  Stage 3;. Creatinine 1.5. #3 slightly elevated troponins up to 0.08 without any chest pain. Cardiology saw the patient. 4. diabetes mellitus type 2: [name] on NovoLog 70/30  42 units daily with breakfast, 15 units with supper.. Seen by diabetes coordinator.  Diabetic neuropathy: Continue Neurontin.  Discharge Condition: stable   Follow UP  Contact information for after-discharge care    Destination    [facility] .   Specialty:  Skilled Nursing Facility Contact information: [address] [place] [address] [phone]                Discharge Instructions  and  Discharge Medications        Medication List    STOP taking these medications   amoxicillin-clavulanate 875-125 MG tablet Commonly known as:  AUGMENTIN     TAKE these medications   aspirin EC 81 MG tablet Take 1 tablet by mouth 1 day or 1 dose.   atorvastatin 10 MG tablet Commonly known as:  LIPITOR Take 1 tablet by mouth daily.   carvedilol 3.125 MG tablet Commonly known as:  COREG Take 1 tablet (3.125 mg total) by mouth 2 (two) times daily with a meal.   furosemide 20 MG tablet Commonly known as:  LASIX Take 1 tablet (20 mg total) by mouth daily. Start taking on:  09/18/2016   gabapentin 100 MG capsule Commonly known as:  NEURONTIN Take 1 capsule by mouth daily.   insulin aspart protamine- aspart (70-30) 100 UNIT/ML injection Commonly known as:  NOVOLOG MIX 70/30 Inject 0.38 mLs (38 Units total) into the skin daily with breakfast. What changed:  Another medication with the same name was changed. Make sure  you understand how and when to take each.   insulin aspart protamine- aspart (70-30) 100 UNIT/ML injection Commonly known as:  NOVOLOG MIX 70/30 Inject 0.15 mLs (15 Units total) into the skin daily with supper. What changed:  how much to take    KLOR-CON M20 20 MEQ tablet Generic drug:  potassium chloride SA Take 1 tablet by mouth daily.   lisinopril 2.5 MG tablet Commonly known as:  PRINIVIL,ZESTRIL Take 1 tablet (2.5 mg total) by mouth daily. Start taking on:  09/18/2016   Vitamin D (Ergocalciferol) 50000 units Caps capsule Commonly known as:  DRISDOL Take 1 capsule by mouth once a week.         Diet and Activity recommendation: See Discharge Instructions above   Consults obtained - cardio   Major procedures and Radiology Reports - PLEASE review detailed and final reports for all details, in brief -      Dg Chest 1 View  Result Date: 08/22/2016 CLINICAL DATA:  Hypoxic EXAM: CHEST 1 VIEW COMPARISON:  08/21/2016 FINDINGS: Mild patchy bibasilar opacities, likely atelectasis, right lower lobe pneumonia not excluded. Possible small bilateral pleural effusions.  No pneumothorax. The heart is normal in size. IMPRESSION: Mild patchy bibasilar opacities, likely atelectasis, right lower lobe pneumonia not excluded. Possible small bilateral pleural effusions. Electronically Signed   By: Charline Bills M.D.   On: 08/22/2016 10:23   Dg Chest 2 View  Result Date: 09/15/2016 CLINICAL DATA:  History of CHF, diabetes, and previous CVA. Episode of shortness of breath and chest pain last night. Former smoker. EXAM: CHEST  2 VIEW COMPARISON:  Portable chest x-ray of September 14, 2016 FINDINGS: The lungs are reasonably well inflated. Bilateral pleural effusions are again demonstrated. The pulmonary interstitial markings are increased bilaterally. The cardiac silhouette is enlarged but its margins are obscured by the left pleural effusion. There is tortuosity of the ascending and descending thoracic aorta with mural calcification. The bony thorax is unremarkable. IMPRESSION: CHF with pulmonary interstitial edema and bilateral pleural effusions. Electronically Signed   By: David  [place] M.D.   On: 09/15/2016 08:01   Dg Chest Portable 1  View  Result Date: 09/14/2016 CLINICAL DATA:  82 year old female with shortness of breath EXAM: PORTABLE CHEST 1 VIEW COMPARISON:  Chest radiograph dated 08/22/2016 FINDINGS: Single portable view of the chest demonstrates central vascular and interstitial prominence compatible with congestive changes. Small bilateral pleural effusions noted. Bilateral mid to lower lung field hazy densities may be related to congestive changes or atelectasis versus infiltrate. There is no pneumothorax. Stable appearing cardiac silhouette. No acute osseous pathology. IMPRESSION: Congestive changes with small bilateral pleural effusions. Pneumonia is not excluded. Clinical correlation and follow-up is recommended. Electronically Signed   By: Elgie Collard M.D.   On: 09/14/2016 23:31   Dg Chest Portable 1 View  Result Date: 08/21/2016 CLINICAL DATA:  Hypoxia, history hypertension, stroke, diabetes mellitus EXAM: PORTABLE CHEST 1 VIEW COMPARISON:  Portable exam 2054 hours compared 02/04/2014 FINDINGS: Normal heart size, mediastinal contours, and pulmonary vascularity. Atherosclerotic calcification aorta. Bronchitic changes with bibasilar scarring. No acute infiltrate, pleural effusion or pneumothorax. Bones demineralized. Prior cervical spine fusion. IMPRESSION: Bronchitic changes with bibasilar scarring. No acute abnormalities. Aortic atherosclerosis. Electronically Signed   By: Ulyses Southward M.D.   On: 08/21/2016 21:15    Micro Results    Recent Results (from the past 240 hour(s))  MRSA PCR Screening     Status: None   Collection Time: 09/15/16  3:35 AM  Result Value Ref  Range Status   MRSA by PCR NEGATIVE NEGATIVE Final    Comment:        The GeneXpert MRSA Assay (FDA approved for NASAL specimens only), is one component of a comprehensive MRSA colonization surveillance program. It is not intended to diagnose MRSA infection nor to guide or monitor treatment for MRSA infections.        Today    Subjective:   [name] [name] today has no shortness  of breath, no hypoxia.   Objective:   Blood pressure (!) 126/99, pulse 72, temperature 98.7 F (37.1 C), temperature source Oral, resp. rate 16, height 5\' 2"  (1.575 m), weight 73 kg (160 lb 14.4 oz), SpO2 98 %.   Intake/Output Summary (Last 24 hours) at 09/17/16 1206 Last data filed at 09/16/16 1848  Gross per 24 hour  Intake              240 ml  Output                0 ml  Net              240 ml    Exam Awake Alert, Oriented x 3, No new F.N deficits, Normal affect Robbins.AT,PERRAL Supple Neck,No JVD, No cervical lymphadenopathy appriciated.  Symmetrical Chest wall movement, Good air movement bilaterally, CTAB RRR,No Gallops,Rubs or new Murmurs, No Parasternal Heave +ve B.Sounds, Abd Soft, Non tender, No organomegaly appriciated, No rebound -guarding or rigidity. No Cyanosis, Clubbing or edema, No new Rash or bruise  Data Review   CBC w Diff: Lab Results  Component Value Date   WBC 6.2 09/14/2016   HGB 11.9 (L) 09/14/2016   HGB 11.5 (L) 02/04/2014   HCT 35.4 09/14/2016   HCT 35.8 02/04/2014   PLT 209 09/14/2016   PLT 174 02/04/2014   LYMPHOPCT 54 09/14/2016   LYMPHOPCT 35.8 02/04/2014   MONOPCT 6 09/14/2016   MONOPCT 6.3 02/04/2014   EOSPCT 3 09/14/2016   EOSPCT 2.9 02/04/2014   BASOPCT 1 09/14/2016   BASOPCT 1.2 02/04/2014    CMP: Lab Results  Component Value Date   NA 140 09/17/2016   NA 141 02/04/2014   K 4.3 09/17/2016   K 4.3 02/04/2014   CL 104 09/17/2016   CL 108 (H) 02/04/2014   CO2 31 09/17/2016   CO2 29 02/04/2014   BUN 51 (H) 09/17/2016   BUN 12 02/04/2014   CREATININE 1.55 (H) 09/17/2016   CREATININE 1.06 02/04/2014   PROT 8.1 09/14/2016   ALBUMIN 3.1 (L) 09/14/2016   BILITOT 0.7 09/14/2016   ALKPHOS 92 09/14/2016   AST 29 09/14/2016   ALT 13 (L) 09/14/2016  .   Total Time in preparing paper work, data evaluation and todays exam - 35 minutes  Lenay Lovejoy M.D on  09/17/2016 at 12:06 PM    Note: This dictation was prepared with Dragon dictation along with smaller phrase technology. Any transcriptional errors that result from this process are unintentional.

## 2016-09-17 NOTE — Progress Notes (Signed)
SUBJECTIVE: [name] feeling much better no chest pain or shortness of breath   Vitals:   09/16/16 1700 09/16/16 1946 09/17/16 0451 09/17/16 0820  BP: (!) 120/96 (!) 106/39 134/63 (!) 126/99  Pulse:  74 73 72  Resp:  17 16   Temp:  98.7 F (37.1 C)    TempSrc:  Oral    SpO2:  100% 98%   Weight:   160 lb 14.4 oz (73 kg)   Height:        Intake/Output Summary (Last 24 hours) at 09/17/16 1038 Last data filed at 09/16/16 1848  Gross per 24 hour  Intake              240 ml  Output                0 ml  Net              240 ml    LABS: Basic Metabolic Panel:  Recent Labs  12/21/7207/29/17 0511 09/17/16 0516  NA 141 140  K 3.7 4.3  CL 105 104  CO2 31 31  GLUCOSE 130* 193*  BUN 43* 51*  CREATININE 1.52* 1.55*  CALCIUM 8.5* 8.5*   Liver Function Tests:  Recent Labs  09/14/16 2158  AST 29  ALT 13*  ALKPHOS 92  BILITOT 0.7  PROT 8.1  ALBUMIN 3.1*   No results for input(s): LIPASE, AMYLASE in the last 72 hours. CBC:  Recent Labs  09/14/16 2158  WBC 6.2  NEUTROABS 2.2  HGB 11.9*  HCT 35.4  MCV 93.3  PLT 209   Cardiac Enzymes:  Recent Labs  09/15/16 0403 09/15/16 1010 09/15/16 1510  TROPONINI 0.08* 0.08* 0.08*   BNP: Invalid input(s): POCBNP D-Dimer: No results for input(s): DDIMER in the last 72 hours. Hemoglobin A1C: No results for input(s): HGBA1C in the last 72 hours. Fasting Lipid Panel: No results for input(s): CHOL, HDL, LDLCALC, TRIG, CHOLHDL, LDLDIRECT in the last 72 hours. Thyroid Function Tests:  Recent Labs  09/15/16 0403  TSH 0.586   Anemia Panel: No results for input(s): VITAMINB12, FOLATE, FERRITIN, TIBC, IRON, RETICCTPCT in the last 72 hours.   PHYSICAL EXAM General: Well developed, well nourished, in no acute distress HEENT:  Normocephalic and atramatic Neck:  No JVD.  Lungs: Clear bilaterally to auscultation and percussion. Heart: HRRR . Normal S1 and S2 without gallops or murmurs.  Abdomen: Bowel sounds are positive,  abdomen soft and non-tender  Msk:  Back normal, normal gait. Normal strength and tone for age. Extremities: No clubbing, cyanosis or edema.   Neuro: Alert and oriented X 3. Psych:  Good affect, responds appropriately  TELEMETRY:Sinus rhythm  ASSESSMENT AND PLAN: Congestive heart failure due to diastolic dysfunction with normal ejection fraction. Patient can go home on current medicines including Lasix and follow-up in the office on Tuesday at 10:00.  Active Problems:   Acute respiratory failure with hypoxia (HCC)    Adrian BlackwaterKHAN,Siennah Barrasso A, MD, [facility] 09/17/2016 10:38 AM

## 2016-09-17 NOTE — Plan of Care (Signed)
Problem: Education: Goal: Ability to demonstrate managment of disease process will improve Outcome: Not Met (add Reason) Patient has memory impairment, care will be provided by facility.

## 2016-09-17 NOTE — Progress Notes (Signed)
82 yo female ordered enoxaparin 40mg  SQ Q24hr.   Patient's CrCl ~ 6528mL/min. Per protocol will transition patient to enoxaparin 30mg  SQ Q24hr.    Pharmacy will continue to monitor and adjust per protocol.    MLS

## 2016-09-17 NOTE — Clinical Social Work Note (Signed)
Patient to dc back to [facility] via transport from her brother, Jillyn HiddenGary. The facility and family are aware. [employer] has authorized 7 days for CHF observation at [facility]. Berkley HarveyAuth #[id number]. CSW will con't to follow pending any additional dc needs.   Argentina PonderKaren Martha Keola Heninger, MSW, LCSW-A [phone]

## 2016-09-19 DIAGNOSIS — G629 Polyneuropathy, unspecified: Secondary | ICD-10-CM | POA: Diagnosis not present

## 2016-09-19 DIAGNOSIS — L02211 Cutaneous abscess of abdominal wall: Secondary | ICD-10-CM | POA: Diagnosis not present

## 2016-09-19 DIAGNOSIS — I509 Heart failure, unspecified: Secondary | ICD-10-CM | POA: Diagnosis not present

## 2016-09-19 DIAGNOSIS — E784 Other hyperlipidemia: Secondary | ICD-10-CM | POA: Diagnosis not present

## 2016-09-19 DIAGNOSIS — E119 Type 2 diabetes mellitus without complications: Secondary | ICD-10-CM | POA: Diagnosis not present

## 2016-09-19 DIAGNOSIS — I10 Essential (primary) hypertension: Secondary | ICD-10-CM | POA: Diagnosis not present

## 2016-10-18 DIAGNOSIS — I5020 Unspecified systolic (congestive) heart failure: Secondary | ICD-10-CM | POA: Diagnosis not present

## 2016-10-18 DIAGNOSIS — E1140 Type 2 diabetes mellitus with diabetic neuropathy, unspecified: Secondary | ICD-10-CM | POA: Diagnosis not present

## 2016-10-18 DIAGNOSIS — M6281 Muscle weakness (generalized): Secondary | ICD-10-CM | POA: Diagnosis not present

## 2016-10-18 DIAGNOSIS — Z9181 History of falling: Secondary | ICD-10-CM | POA: Diagnosis not present

## 2016-10-18 DIAGNOSIS — J9601 Acute respiratory failure with hypoxia: Secondary | ICD-10-CM | POA: Diagnosis not present

## 2016-10-18 DIAGNOSIS — Z5189 Encounter for other specified aftercare: Secondary | ICD-10-CM | POA: Diagnosis not present

## 2016-10-18 DIAGNOSIS — R42 Dizziness and giddiness: Secondary | ICD-10-CM | POA: Diagnosis not present

## 2016-11-01 DIAGNOSIS — I10 Essential (primary) hypertension: Secondary | ICD-10-CM | POA: Diagnosis not present

## 2016-11-01 DIAGNOSIS — E118 Type 2 diabetes mellitus with unspecified complications: Secondary | ICD-10-CM | POA: Diagnosis not present

## 2016-11-01 DIAGNOSIS — Z794 Long term (current) use of insulin: Secondary | ICD-10-CM | POA: Diagnosis not present

## 2016-11-01 DIAGNOSIS — Z8673 Personal history of transient ischemic attack (TIA), and cerebral infarction without residual deficits: Secondary | ICD-10-CM | POA: Diagnosis not present

## 2016-11-01 DIAGNOSIS — I5032 Chronic diastolic (congestive) heart failure: Secondary | ICD-10-CM | POA: Diagnosis not present

## 2016-11-01 DIAGNOSIS — Z09 Encounter for follow-up examination after completed treatment for conditions other than malignant neoplasm: Secondary | ICD-10-CM | POA: Diagnosis not present

## 2016-11-01 DIAGNOSIS — E1149 Type 2 diabetes mellitus with other diabetic neurological complication: Secondary | ICD-10-CM | POA: Diagnosis not present

## 2016-11-01 DIAGNOSIS — Z23 Encounter for immunization: Secondary | ICD-10-CM | POA: Diagnosis not present

## 2016-11-09 ENCOUNTER — Inpatient Hospital Stay
Admission: EM | Admit: 2016-11-09 | Discharge: 2016-11-15 | DRG: 629 | Disposition: A | Discharge status: Skilled Nursing Facility | Payer: Commercial Managed Care - HMO | Attending: Internal Medicine | Admitting: Internal Medicine

## 2016-11-09 ENCOUNTER — Emergency Department: Payer: Commercial Managed Care - HMO

## 2016-11-09 ENCOUNTER — Encounter: Payer: Self-pay | Admitting: Emergency Medicine

## 2016-11-09 DIAGNOSIS — Z79899 Other long term (current) drug therapy: Secondary | ICD-10-CM

## 2016-11-09 DIAGNOSIS — Z7982 Long term (current) use of aspirin: Secondary | ICD-10-CM

## 2016-11-09 DIAGNOSIS — I739 Peripheral vascular disease, unspecified: Secondary | ICD-10-CM | POA: Diagnosis not present

## 2016-11-09 DIAGNOSIS — W19XXXA Unspecified fall, initial encounter: Secondary | ICD-10-CM | POA: Diagnosis present

## 2016-11-09 DIAGNOSIS — L97529 Non-pressure chronic ulcer of other part of left foot with unspecified severity: Secondary | ICD-10-CM

## 2016-11-09 DIAGNOSIS — E11621 Type 2 diabetes mellitus with foot ulcer: Principal | ICD-10-CM | POA: Diagnosis present

## 2016-11-09 DIAGNOSIS — I6521 Occlusion and stenosis of right carotid artery: Secondary | ICD-10-CM | POA: Diagnosis present

## 2016-11-09 DIAGNOSIS — Z87891 Personal history of nicotine dependence: Secondary | ICD-10-CM | POA: Diagnosis not present

## 2016-11-09 DIAGNOSIS — B952 Enterococcus as the cause of diseases classified elsewhere: Secondary | ICD-10-CM | POA: Diagnosis not present

## 2016-11-09 DIAGNOSIS — E119 Type 2 diabetes mellitus without complications: Secondary | ICD-10-CM | POA: Diagnosis not present

## 2016-11-09 DIAGNOSIS — K5900 Constipation, unspecified: Secondary | ICD-10-CM | POA: Diagnosis present

## 2016-11-09 DIAGNOSIS — I10 Essential (primary) hypertension: Secondary | ICD-10-CM | POA: Diagnosis not present

## 2016-11-09 DIAGNOSIS — Z9289 Personal history of other medical treatment: Secondary | ICD-10-CM

## 2016-11-09 DIAGNOSIS — R410 Disorientation, unspecified: Secondary | ICD-10-CM | POA: Diagnosis not present

## 2016-11-09 DIAGNOSIS — N179 Acute kidney failure, unspecified: Secondary | ICD-10-CM | POA: Diagnosis not present

## 2016-11-09 DIAGNOSIS — Z452 Encounter for adjustment and management of vascular access device: Secondary | ICD-10-CM | POA: Diagnosis not present

## 2016-11-09 DIAGNOSIS — S92912A Unspecified fracture of left toe(s), initial encounter for closed fracture: Secondary | ICD-10-CM | POA: Diagnosis present

## 2016-11-09 DIAGNOSIS — L97409 Non-pressure chronic ulcer of unspecified heel and midfoot with unspecified severity: Secondary | ICD-10-CM | POA: Diagnosis present

## 2016-11-09 DIAGNOSIS — E1142 Type 2 diabetes mellitus with diabetic polyneuropathy: Secondary | ICD-10-CM | POA: Diagnosis present

## 2016-11-09 DIAGNOSIS — I70202 Unspecified atherosclerosis of native arteries of extremities, left leg: Secondary | ICD-10-CM | POA: Diagnosis present

## 2016-11-09 DIAGNOSIS — E1165 Type 2 diabetes mellitus with hyperglycemia: Secondary | ICD-10-CM | POA: Diagnosis present

## 2016-11-09 DIAGNOSIS — E10621 Type 1 diabetes mellitus with foot ulcer: Secondary | ICD-10-CM | POA: Diagnosis present

## 2016-11-09 DIAGNOSIS — I5032 Chronic diastolic (congestive) heart failure: Secondary | ICD-10-CM | POA: Diagnosis present

## 2016-11-09 DIAGNOSIS — Z794 Long term (current) use of insulin: Secondary | ICD-10-CM | POA: Diagnosis not present

## 2016-11-09 DIAGNOSIS — E785 Hyperlipidemia, unspecified: Secondary | ICD-10-CM | POA: Diagnosis present

## 2016-11-09 DIAGNOSIS — R7881 Bacteremia: Secondary | ICD-10-CM | POA: Diagnosis not present

## 2016-11-09 DIAGNOSIS — N183 Chronic kidney disease, stage 3 (moderate): Secondary | ICD-10-CM | POA: Diagnosis present

## 2016-11-09 DIAGNOSIS — L97519 Non-pressure chronic ulcer of other part of right foot with unspecified severity: Secondary | ICD-10-CM | POA: Diagnosis not present

## 2016-11-09 DIAGNOSIS — Z8673 Personal history of transient ischemic attack (TIA), and cerebral infarction without residual deficits: Secondary | ICD-10-CM | POA: Diagnosis not present

## 2016-11-09 DIAGNOSIS — E1151 Type 2 diabetes mellitus with diabetic peripheral angiopathy without gangrene: Secondary | ICD-10-CM | POA: Diagnosis not present

## 2016-11-09 DIAGNOSIS — L03116 Cellulitis of left lower limb: Secondary | ICD-10-CM

## 2016-11-09 DIAGNOSIS — L97429 Non-pressure chronic ulcer of left heel and midfoot with unspecified severity: Secondary | ICD-10-CM | POA: Diagnosis present

## 2016-11-09 DIAGNOSIS — A499 Bacterial infection, unspecified: Secondary | ICD-10-CM | POA: Diagnosis not present

## 2016-11-09 DIAGNOSIS — M79672 Pain in left foot: Secondary | ICD-10-CM | POA: Diagnosis present

## 2016-11-09 DIAGNOSIS — D559 Anemia due to enzyme disorder, unspecified: Secondary | ICD-10-CM | POA: Diagnosis not present

## 2016-11-09 DIAGNOSIS — L89629 Pressure ulcer of left heel, unspecified stage: Secondary | ICD-10-CM | POA: Diagnosis not present

## 2016-11-09 DIAGNOSIS — E1169 Type 2 diabetes mellitus with other specified complication: Secondary | ICD-10-CM | POA: Diagnosis not present

## 2016-11-09 DIAGNOSIS — I70248 Atherosclerosis of native arteries of left leg with ulceration of other part of lower left leg: Secondary | ICD-10-CM | POA: Diagnosis not present

## 2016-11-09 DIAGNOSIS — E559 Vitamin D deficiency, unspecified: Secondary | ICD-10-CM | POA: Diagnosis not present

## 2016-11-09 DIAGNOSIS — I130 Hypertensive heart and chronic kidney disease with heart failure and stage 1 through stage 4 chronic kidney disease, or unspecified chronic kidney disease: Secondary | ICD-10-CM | POA: Diagnosis present

## 2016-11-09 DIAGNOSIS — S92415A Nondisplaced fracture of proximal phalanx of left great toe, initial encounter for closed fracture: Secondary | ICD-10-CM | POA: Diagnosis not present

## 2016-11-09 LAB — CBC WITH DIFFERENTIAL/PLATELET
BASOS ABS: 0 10*3/uL (ref 0–0.1)
BASOS PCT: 0 %
Basophils Absolute: 0.1 10*3/uL (ref 0–0.1)
Basophils Relative: 0 %
EOS ABS: 0 10*3/uL (ref 0–0.7)
EOS ABS: 0 10*3/uL (ref 0–0.7)
EOS PCT: 0 %
Eosinophils Relative: 0 %
HCT: 35 % (ref 35.0–47.0)
HCT: 29.3 % — ABNORMAL LOW (ref 35.0–47.0)
HEMOGLOBIN: 11.3 g/dL — AB (ref 12.0–16.0)
Hemoglobin: 9.7 g/dL — ABNORMAL LOW (ref 12.0–16.0)
LYMPHS PCT: 7 %
Lymphocytes Relative: 6 %
Lymphs Abs: 1.1 10*3/uL (ref 1.0–3.6)
Lymphs Abs: 1.2 10*3/uL (ref 1.0–3.6)
MCH: 29.6 pg (ref 26.0–34.0)
MCH: 29.9 pg (ref 26.0–34.0)
MCHC: 32.4 g/dL (ref 32.0–36.0)
MCHC: 33 g/dL (ref 32.0–36.0)
MCV: 91.4 fL (ref 80.0–100.0)
MCV: 90.6 fL (ref 80.0–100.0)
MONO ABS: 0.9 10*3/uL (ref 0.2–0.9)
Monocytes Absolute: 0.6 10*3/uL (ref 0.2–0.9)
Monocytes Relative: 3 %
Monocytes Relative: 5 %
NEUTROS PCT: 91 %
Neutro Abs: 17.6 10*3/uL — ABNORMAL HIGH (ref 1.4–6.5)
Neutro Abs: 16.2 10*3/uL — ABNORMAL HIGH (ref 1.4–6.5)
Neutrophils Relative %: 88 %
PLATELETS: 241 10*3/uL (ref 150–440)
PLATELETS: 218 10*3/uL (ref 150–440)
RBC: 3.83 MIL/uL (ref 3.80–5.20)
RBC: 3.23 MIL/uL — ABNORMAL LOW (ref 3.80–5.20)
RDW: 15.9 % — ABNORMAL HIGH (ref 11.5–14.5)
RDW: 15.8 % — AB (ref 11.5–14.5)
WBC: 19.4 10*3/uL — AB (ref 3.6–11.0)
WBC: 18.3 10*3/uL — ABNORMAL HIGH (ref 3.6–11.0)

## 2016-11-09 LAB — COMPREHENSIVE METABOLIC PANEL
ALK PHOS: 71 U/L (ref 38–126)
ALT: 13 U/L — ABNORMAL LOW (ref 14–54)
ALT: 12 U/L — AB (ref 14–54)
AST: 31 U/L (ref 15–41)
AST: 24 U/L (ref 15–41)
Albumin: 2.9 g/dL — ABNORMAL LOW (ref 3.5–5.0)
Albumin: 2.4 g/dL — ABNORMAL LOW (ref 3.5–5.0)
Alkaline Phosphatase: 78 U/L (ref 38–126)
Anion gap: 9 (ref 5–15)
Anion gap: 5 (ref 5–15)
BILIRUBIN TOTAL: 0.4 mg/dL (ref 0.3–1.2)
BUN: 31 mg/dL — ABNORMAL HIGH (ref 6–20)
BUN: 32 mg/dL — AB (ref 6–20)
CALCIUM: 8.4 mg/dL — AB (ref 8.9–10.3)
CHLORIDE: 104 mmol/L (ref 101–111)
CO2: 23 mmol/L (ref 22–32)
CO2: 25 mmol/L (ref 22–32)
CREATININE: 1.25 mg/dL — AB (ref 0.44–1.00)
Calcium: 8.9 mg/dL (ref 8.9–10.3)
Chloride: 103 mmol/L (ref 101–111)
Creatinine, Ser: 1.2 mg/dL — ABNORMAL HIGH (ref 0.44–1.00)
GFR calc Af Amer: 48 mL/min — ABNORMAL LOW (ref >60)
GFR calc Af Amer: 46 mL/min — ABNORMAL LOW (ref >60)
GFR calc non Af Amer: 42 mL/min — ABNORMAL LOW (ref >60)
GFR, EST NON AFRICAN AMERICAN: 40 mL/min — AB (ref >60)
Glucose, Bld: 145 mg/dL — ABNORMAL HIGH (ref 65–99)
Glucose, Bld: 60 mg/dL — ABNORMAL LOW (ref 65–99)
Potassium: 4.9 mmol/L (ref 3.5–5.1)
Potassium: 4.5 mmol/L (ref 3.5–5.1)
Sodium: 135 mmol/L (ref 135–145)
Sodium: 134 mmol/L — ABNORMAL LOW (ref 135–145)
TOTAL PROTEIN: 6.4 g/dL — AB (ref 6.5–8.1)
Total Bilirubin: 0.6 mg/dL (ref 0.3–1.2)
Total Protein: 7.9 g/dL (ref 6.5–8.1)

## 2016-11-09 LAB — GLUCOSE, CAPILLARY
GLUCOSE-CAPILLARY: 160 mg/dL — AB (ref 65–99)
GLUCOSE-CAPILLARY: 42 mg/dL — AB (ref 65–99)
Glucose-Capillary: 72 mg/dL (ref 65–99)

## 2016-11-09 LAB — URINALYSIS COMPLETE WITH MICROSCOPIC (ARMC ONLY)
Bacteria, UA: NONE SEEN
Bilirubin Urine: NEGATIVE
Glucose, UA: 50 mg/dL — AB
Hgb urine dipstick: NEGATIVE
Ketones, ur: NEGATIVE mg/dL
Leukocytes, UA: NEGATIVE
Nitrite: NEGATIVE
Protein, ur: 30 mg/dL — AB
Specific Gravity, Urine: 1.023 (ref 1.005–1.030)
pH: 5 (ref 5.0–8.0)

## 2016-11-09 LAB — LACTIC ACID, PLASMA: LACTIC ACID, VENOUS: 1.2 mmol/L (ref 0.5–1.9)

## 2016-11-09 LAB — SEDIMENTATION RATE: SED RATE: 75 mm/h — AB (ref 0–30)

## 2016-11-09 LAB — TROPONIN I: Troponin I: 0.05 ng/mL (ref <0.03)

## 2016-11-09 LAB — CK: Total CK: 368 U/L — ABNORMAL HIGH (ref 38–234)

## 2016-11-09 MED ORDER — LISINOPRIL 5 MG PO TABS
2.5000 mg | ORAL_TABLET | Freq: Every day | ORAL | Status: DC
  Administered 2016-11-11 – 2016-11-15 (×5): 2.5 mg via ORAL
  Filled 2016-11-09 (×5): qty 1

## 2016-11-09 MED ORDER — METRONIDAZOLE 500 MG PO TABS
500.0000 mg | ORAL_TABLET | Freq: Three times a day (TID) | ORAL | Status: DC
Start: 2016-11-09 — End: 2016-11-11
  Administered 2016-11-09 – 2016-11-11 (×5): 500 mg via ORAL
  Filled 2016-11-09 (×5): qty 1

## 2016-11-09 MED ORDER — CARVEDILOL 3.125 MG PO TABS
3.1250 mg | ORAL_TABLET | Freq: Two times a day (BID) | ORAL | Status: DC
  Administered 2016-11-10 – 2016-11-15 (×10): 3.125 mg via ORAL
  Filled 2016-11-09 (×10): qty 1

## 2016-11-09 MED ORDER — DIPHENHYDRAMINE HCL 25 MG PO CAPS: 25.0000 mg | ORAL_CAPSULE | Freq: Four times a day (QID) | ORAL | Status: DC | PRN

## 2016-11-09 MED ORDER — VITAMIN D (ERGOCALCIFEROL) 1.25 MG (50000 UNIT) PO CAPS
50000.0000 [IU] | ORAL_CAPSULE | ORAL | Status: DC
  Filled 2016-11-09: qty 1

## 2016-11-09 MED ORDER — PIPERACILLIN-TAZOBACTAM 3.375 G IVPB 30 MIN
3.3750 g | Freq: Once | INTRAVENOUS | Status: AC
  Administered 2016-11-09: 3.375 g via INTRAVENOUS
  Filled 2016-11-09: qty 50

## 2016-11-09 MED ORDER — VANCOMYCIN HCL IN DEXTROSE 1-5 GM/200ML-% IV SOLN
1000.0000 mg | Freq: Once | INTRAVENOUS | Status: AC
  Administered 2016-11-09: 1000 mg via INTRAVENOUS
  Filled 2016-11-09: qty 200

## 2016-11-09 MED ORDER — ASPIRIN EC 81 MG PO TBEC
81.0000 mg | DELAYED_RELEASE_TABLET | ORAL | Status: DC
  Administered 2016-11-09 – 2016-11-15 (×6): 81 mg via ORAL
  Filled 2016-11-09 (×6): qty 1

## 2016-11-09 MED ORDER — INSULIN ASPART PROT & ASPART (70-30 MIX) 100 UNIT/ML ~~LOC~~ SUSP
15.0000 [IU] | Freq: Every day | SUBCUTANEOUS | Status: DC
  Administered 2016-11-10: 15 [IU] via SUBCUTANEOUS
  Filled 2016-11-09: qty 15

## 2016-11-09 MED ORDER — DEXTROSE 5 % IV SOLN
2.0000 g | INTRAVENOUS | Status: DC
  Filled 2016-11-09: qty 2

## 2016-11-09 MED ORDER — ENOXAPARIN SODIUM 40 MG/0.4ML ~~LOC~~ SOLN
40.0000 mg | SUBCUTANEOUS | Status: DC
  Administered 2016-11-09 – 2016-11-11 (×3): 40 mg via SUBCUTANEOUS
  Filled 2016-11-09 (×3): qty 0.4

## 2016-11-09 MED ORDER — GABAPENTIN 100 MG PO CAPS
200.0000 mg | ORAL_CAPSULE | Freq: Two times a day (BID) | ORAL | Status: DC
Start: 2016-11-09 — End: 2016-11-15
  Administered 2016-11-09 – 2016-11-15 (×12): 200 mg via ORAL
  Filled 2016-11-09 (×13): qty 2

## 2016-11-09 MED ORDER — POTASSIUM CHLORIDE CRYS ER 20 MEQ PO TBCR
20.0000 meq | EXTENDED_RELEASE_TABLET | Freq: Every day | ORAL | Status: DC
  Administered 2016-11-10 – 2016-11-13 (×4): 20 meq via ORAL
  Filled 2016-11-09 (×4): qty 1

## 2016-11-09 MED ORDER — INSULIN ASPART PROT & ASPART (70-30 MIX) 100 UNIT/ML ~~LOC~~ SUSP
38.0000 [IU] | Freq: Every day | SUBCUTANEOUS | Status: DC
  Administered 2016-11-10 – 2016-11-11 (×2): 38 [IU] via SUBCUTANEOUS
  Filled 2016-11-09 (×2): qty 38

## 2016-11-09 MED ORDER — ATORVASTATIN CALCIUM 10 MG PO TABS
10.0000 mg | ORAL_TABLET | Freq: Every day | ORAL | Status: DC
  Administered 2016-11-09 – 2016-11-14 (×6): 10 mg via ORAL
  Filled 2016-11-09 (×6): qty 1

## 2016-11-09 MED ORDER — FUROSEMIDE 20 MG PO TABS
20.0000 mg | ORAL_TABLET | Freq: Every day | ORAL | Status: DC
  Administered 2016-11-10 – 2016-11-15 (×6): 20 mg via ORAL
  Filled 2016-11-09 (×6): qty 1

## 2016-11-09 MED ORDER — CEFTRIAXONE SODIUM-DEXTROSE 2-2.22 GM-% IV SOLR
2.0000 g | INTRAVENOUS | Status: DC
  Administered 2016-11-09: 2 g via INTRAVENOUS
  Filled 2016-11-09 (×2): qty 50

## 2016-11-09 MED ORDER — LORATADINE 10 MG PO TABS
10.0000 mg | ORAL_TABLET | Freq: Every day | ORAL | Status: DC
Start: 2016-11-10 — End: 2016-11-15
  Administered 2016-11-10 – 2016-11-15 (×6): 10 mg via ORAL
  Filled 2016-11-09 (×6): qty 1

## 2016-11-09 NOTE — Consult Note (Signed)
Pharmacy Antibiotic Note  [name] [name] is a 82 y.o. female admitted on 11/09/2016 with a fall/ diabetic foot infection.  Pharmacy has been consulted for vancomycin dosing.  Plan: Patient received 1g of vancomycin in the ED. Will give next dose in 10 hours for stacked dosing. Vancomycin 1000mg  IV every 24 hours.  Goal trough 15-20 mcg/mL. Trough at steady state 11/26 @ 0330  Height: 5\' 2"  (157.5 cm) Weight: 162 lb (73.5 kg) IBW/kg (Calculated) : 50.1  Temp (24hrs), Avg:98.9 F (37.2 C), Min:98.9 F (37.2 C), Max:98.9 F (37.2 C)   Recent Labs Lab 11/09/16 1231 11/09/16 1648  WBC 19.4*  --   CREATININE 1.20*  --   LATICACIDVEN  --  1.2    Estimated Creatinine Clearance: 35.1 mL/min (by C-G formula based on SCr of 1.2 mg/dL (H)).    No Known Allergies  Antimicrobials this admission: zosyn 11/22 >> one dose vancomycin 11/22 >>  Metronidazole 11/22>> Ceftriaxone 11/22>>  Dose adjustments this admission:   Microbiology results: 11/22 BCx:    Thank you for allowing pharmacy to be a part of this patient's care.  Olene FlossMelissa D Illona Bulman, Pharm.D Clinical Pharmacist  11/09/2016 7:16 PM

## 2016-11-09 NOTE — ED Triage Notes (Addendum)
Pt reports fell she thinks last night but is not sure.  Her brother found her this morning and woke her up. She laid in floor all night. Pt doesn't know why you fell.  C/o left leg. Does not appear in distress at this time. Has a new recent sore on foot.  No respiratory distress at this time. Disoriented to time. This is abnormal per brother.  Sister in law gave meds last night and was acting different at that time. Has had stroke in past.

## 2016-11-09 NOTE — H&P (Signed)
[facility] [place] [facility]   PATIENT NAME: [name] [name]    MR#:  [id number]  DATE OF BIRTH:  Jul 24, 1934  DATE OF ADMISSION:  11/09/2016  PRIMARY CARE PHYSICIAN: Barbette ReichmannHANDE,VISHWANATH, MD   REQUESTING/REFERRING PHYSICIAN: Dr. Governor Rooksebecca Lord  CHIEF COMPLAINT: Fall    Chief Complaint  Patient presents with  . Fall    HISTORY OF PRESENT ILLNESS:  [name] Chickrnell Lafortune  is a 82 y.o. female with a known history of Diabetes mellitus type 2 was essential hypertension, stroke comes in because of the fall at home. Brought by the family. Patient does not remember why she fell Patient not sure if she had hit her head. Found by the brother lying on the floor. Patient denies chest pain, shortness of breath. Noted that patient has also on the left heel. Unable to tell me how long she has it. Patient has black necrotic ulcers present on the left heel. Also noted an ulcer present on the lateral aspect of the right foot. WBC elevated at 19. [name] not a good historian. Admitting the patient for diabetic heel ulcer on the left heel of unknown  Duration.  PAST MEDICAL HISTORY:   Past Medical History:  Diagnosis Date  . Diabetes mellitus without complication (HCC)   . Hypertension   . Stroke [facility](HCC)     PAST SURGICAL HISTOIRY:  History reviewed. No pertinent surgical history.  SOCIAL HISTORY:   Social History  Substance Use Topics  . Smoking status: Former Smoker    Types: Cigarettes  . Smokeless tobacco: Never Used  . Alcohol use No    FAMILY HISTORY:  History reviewed. No pertinent family history.  DRUG ALLERGIES:  No Known Allergies  REVIEW OF SYSTEMS:  CONSTITUTIONAL: No fever, fatigue or weakness.  EYES: No blurred or double vision.  EARS, NOSE, AND THROAT: No tinnitus or ear pain.  RESPIRATORY: No cough, shortness of breath, wheezing or hemoptysis.  CARDIOVASCULAR: No chest pain, orthopnea, edema.  GASTROINTESTINAL: No nausea, vomiting, diarrhea  or abdominal pain.  GENITOURINARY: No dysuria, hematuria.  ENDOCRINE: No polyuria, nocturia,  HEMATOLOGY: No anemia, easy bruising or bleeding SKIN: Ulcers present on. Left heel. MUSCULOSKELETAL: No joint pain or arthritis.   NEUROLOGIC: No tingling, numbness, weakness.  PSYCHIATRY: No anxiety or depression.   MEDICATIONS AT HOME:   Prior to Admission medications   Medication Sig Start Date End Date Taking? Authorizing Provider  aspirin EC 81 MG tablet Take 1 tablet by mouth 1 day or 1 dose.   Yes Historical Provider, MD  atorvastatin (LIPITOR) 10 MG tablet Take 1 tablet by mouth at bedtime.  03/26/16  Yes Historical Provider, MD  carvedilol (COREG) 3.125 MG tablet Take 1 tablet (3.125 mg total) by mouth 2 (two) times daily with a meal. 09/17/16  Yes Katha HammingSnehalatha Manav Pierotti, MD  diphenhydrAMINE (BENADRYL) 25 mg capsule Take 25 mg by mouth every 6 (six) hours as needed for itching.   Yes Historical Provider, MD  furosemide (LASIX) 20 MG tablet Take 1 tablet (20 mg total) by mouth daily. 09/18/16  Yes Katha HammingSnehalatha Ludie Pavlik, MD  gabapentin (NEURONTIN) 100 MG capsule Take 2 capsules by mouth 2 (two) times daily. Take 2 capsules (200mg ) twice a day 02/23/16  Yes Historical Provider, MD  insulin aspart protamine- aspart (NOVOLOG MIX 70/30) (70-30) 100 UNIT/ML injection Inject 0.38 mLs (38 Units total) into the skin daily with breakfast. Patient taking differently: Inject 42 Units into the skin daily with breakfast.  08/26/16  Yes Enedina FinnerSona Patel, MD  insulin aspart protamine- aspart (NOVOLOG MIX 70/30) (70-30) 100 UNIT/ML injection Inject 0.15 mLs (15 Units total) into the skin daily with supper. Patient taking differently: Inject 17 Units into the skin daily with supper.  09/17/16  Yes Katha HammingSnehalatha Jakaya Jacobowitz, MD  KLOR-CON M20 20 MEQ tablet Take 1 tablet by mouth daily. 02/23/16  Yes Historical Provider, MD  lisinopril (PRINIVIL,ZESTRIL) 2.5 MG tablet Take 1 tablet (2.5 mg total) by mouth daily. 09/18/16  Yes Katha HammingSnehalatha  Medea Deines, MD  loratadine (CLARITIN) 10 MG tablet Take 10 mg by mouth daily.   Yes Historical Provider, MD  Vitamin D, Ergocalciferol, (DRISDOL) 50000 units CAPS capsule Take 1 capsule by mouth once a week. 03/26/16  Yes Historical Provider, MD      VITAL SIGNS:  Blood pressure (!) 127/55, pulse 85, temperature 98.9 F (37.2 C), temperature source Oral, resp. rate 17, height 5\' 2"  (1.575 m), weight 73.5 kg (162 lb), SpO2 96 %.  PHYSICAL EXAMINATION:  GENERAL:  82 y.o.-year-old patient lying in the bed with no acute distress.  EYES: Pupils equal, round, reactive to light and accommodation. No scleral icterus. Extraocular muscles intact.  HEENT: Head atraumatic, normocephalic. Oropharynx and nasopharynx clear.  NECK:  Supple, no jugular venous distention. No thyroid enlargement, no tenderness.  LUNGS: Normal breath sounds bilaterally, no wheezing, rales,rhonchi or crepitation. No use of accessory muscles of respiration.  CARDIOVASCULAR: S1, S2 normal. No murmurs, rubs, or gallops.  ABDOMEN: Soft, nontender, nondistended. Bowel sounds present. No organomegaly or mass.  EXTREMITIES: No pedal edema, cyanosis, or clubbing.  NEUROLOGIC: Cranial nerves II through XII are intact. Muscle strength 5/5 in all extremities. Sensation intact. Gait not checked.  PSYCHIATRIC: The patient is alert and oriented x 3.  SKIN:  Noted to have intact pedal pulses and dorsalis pedis and popliteal, noted to have black deep ulcer on the left heel extending to the foot, noted to have pitting edema on the left leg, and also redness present in the left foot up to left calf. Patient noted to have ulcer on the lateral aspect of the right foot and black eschar noted.  LABORATORY PANEL:   CBC  Recent Labs Lab 11/09/16 1231  WBC 19.4*  HGB 11.3*  HCT 35.0  PLT 241   ------------------------------------------------------------------------------------------------------------------  Chemistries   Recent Labs Lab  11/09/16 1231  NA 135  K 4.9  CL 103  CO2 23  GLUCOSE 145*  BUN 31*  CREATININE 1.20*  CALCIUM 8.9  AST 31  ALT 13*  ALKPHOS 78  BILITOT 0.6   ------------------------------------------------------------------------------------------------------------------  Cardiac Enzymes  Recent Labs Lab 11/09/16 1231  TROPONINI 0.05*   ------------------------------------------------------------------------------------------------------------------  RADIOLOGY:  Ct Head Wo Contrast  Result Date: 11/09/2016 CLINICAL DATA:  Altered mental status with disorientation. Difficulty ambulating. Unwitnessed fall. EXAM: CT HEAD WITHOUT CONTRAST TECHNIQUE: Contiguous axial images were obtained from the base of the skull through the vertex without intravenous contrast. COMPARISON:  April 02, 2016 FINDINGS: Brain: Moderate diffuse atrophy is stable. There is no intracranial mass, hemorrhage, extra-axial fluid collection, or midline shift. There is evidence of prior infarct in the medial left occipital lobe. The patient has had a prior infarct in the medial posterior right lentiform nucleus. There is evidence of prior lacunar type infarcts in each pons region, slightly larger on the left than on the right. There is small vessel disease throughout the centra semiovale bilaterally. No acute infarct is appreciable on this study. Vascular: There is no hyperdense vessel. There is calcification in each carotid siphon region.  Skull: The bony calvarium appears intact. Sinuses/Orbits: There is mild mucosal thickening in several ethmoid air cells bilaterally. Visualized paranasal sinuses elsewhere clear. Visualized orbits appear symmetric bilaterally. Other: Visualized mastoid air cells are clear. IMPRESSION: Stable atrophy with scattered prior small infarcts as summarized above. There is periventricular small vessel disease in the centra semiovale bilaterally. No acute infarct evident. No mass, hemorrhage, or extra-axial  fluid collection. There are foci of calcification in each carotid siphon region. There is mild ethmoid sinus disease bilaterally. Electronically Signed   By: Bretta Bang III M.D.   On: 11/09/2016 13:01   Dg Foot Complete Left  Result Date: 11/09/2016 CLINICAL DATA:  Fall, heel pain and ulcer. EXAM: LEFT FOOT - COMPLETE 3+ VIEW COMPARISON:  None available FINDINGS: Severe osteopenia noted throughout the left foot. Peripheral atherosclerosis present. Degenerative changes noted of the MTP joints and the interphalangeal joints compatible with osteoarthritis. Normal alignment. Linear lucency through the left great toe proximal phalanx, suspicious for a nondisplaced fracture. Recommend correlation for point tenderness in this region. Small plantar calcaneal spur.  Calcaneus appears intact. IMPRESSION: Left great toe proximal phalanx linear lucency concerning for subtle nondisplaced fracture. Recommend correlation for point tenderness in this region. Severe osteopenia and diffuse osteoarthritis Peripheral atherosclerosis Plantar calcaneal spur. Electronically Signed   By: Judie Petit.  Shick M.D.   On: 11/09/2016 16:54    EKG:   Orders placed or performed during the hospital encounter of 11/09/16  . EKG 12-Lead  . EKG 12-Lead  . ED EKG  . ED EKG    IMPRESSION AND PLAN:   #1 diabetic left heel ulcer: Admitted to hospitalist service, started on vancomycin, Flagyl. Obtain podiatric consult in the morning, patient had x-ray of the left and foot, negative for osteo ?xray did not show osteo., patient noted to have great toe proximal phalanx fracture. Patient has no tenderness there. Order MRI of the left foo to evaluate for osteomyelitis,  #2. Diabetes mellitus type 2: Follows up with Dr. Marcello Fennel.. Patient has history of a peripheral neuropathy, peripheral artery disease. Patient will follows up with him, takes Novolin 70/30 42 units in the morning, 17 units at night.  #3 . History of pontine stroke before,  patient unsteady because of that. Continue aspirin, statins.  Is referred  get some home health. As per medical records from Dr. Marcello Fennel ;s office  #4 history of constipation continue stool softeners  Chronic kidney disease stage III: Stable  History of right carotid stenosis: Continue aspirin  For diabetes patient follows up with Dr. Tedd Sias, recent  hemoglobin A1c 9.1.  Chronic diastolic heart failure:: Patient on Coreg, lisinopril   All the records are reviewed and case discussed with ED provider. Management plans discussed with the patient, family and they are in agreement.  CODE STATUS: full  TOTAL TIME TAKING CARE OF THIS PATIENT: .    Katha Hamming M.D on 11/09/2016 at 6:37 PM  Between 7am to 6pm - Pager - [phone]  After 6pm go to [email or web address] - password [email or web address]  [facility]  Office  [phone]  CC: Primary care physician; Barbette Reichmann, MD  Note: This dictation was prepared with Dragon dictation along with smaller phrase technology. Any transcriptional errors that result from this process are unintentional.

## 2016-11-09 NOTE — ED Provider Notes (Signed)
[facility] Emergency Department Provider Note ____________________________________________   I have reviewed the triage vital signs and the triage nursing note.  HISTORY  Chief Complaint Fall   Historian Patient and brother  HPI [name] [name] is a 82 y.o. female who lives at home with daughter, here for evaluation of left foot pain and after a fall.  Patient does not know why she fell and thinks that she may lost her balance, but she is not sure. She's not sure if she hit her head. It sounds like when the brother came over and found her on the floor lying on her back. She denies chest pain or trouble breathing. She's been having pain to her left heel, and brother states that he noticed the ulcer yesterday and started putting triple antibiotic ointment on it yesterday.  Denies recent fevers. Denies knee and weakness or numbness. Brother states that he thinks that she's been a little bit confused this morning.    Past Medical History:  Diagnosis Date  . Diabetes mellitus without complication (HCC)   . Hypertension   . Stroke [facility](HCC)     Patient Active Problem List   Diagnosis Date Noted  . Acute respiratory failure with hypoxia (HCC) 09/15/2016  . Carpal tunnel syndrome 09/01/2016  . Hypertension 09/01/2016  . PVD (peripheral vascular disease) (HCC) 09/01/2016  . Open abdominal wall wound 09/01/2016  . Abscess 08/21/2016  . Type 2 diabetes mellitus with complication (HCC) 05/20/2015  . Visual disturbance 05/20/2015  . H/O: CVA (cerebrovascular accident) 01/21/2015  . Difficulty in walking 08/27/2014  . Diabetes (HCC) 08/20/2014  . Diabetic neuropathy (HCC) 08/20/2014  . Dizzy spells 08/20/2014  . Hand numbness 08/20/2014  . Obesity 08/20/2014    History reviewed. No pertinent surgical history.  Prior to Admission medications   Medication Sig Start Date End Date Taking? Authorizing Provider  aspirin EC 81 MG tablet Take 1 tablet by mouth 1 day  or 1 dose.   Yes Historical Provider, MD  atorvastatin (LIPITOR) 10 MG tablet Take 1 tablet by mouth at bedtime.  03/26/16  Yes Historical Provider, MD  carvedilol (COREG) 3.125 MG tablet Take 1 tablet (3.125 mg total) by mouth 2 (two) times daily with a meal. 09/17/16  Yes Katha HammingSnehalatha Konidena, MD  diphenhydrAMINE (BENADRYL) 25 mg capsule Take 25 mg by mouth every 6 (six) hours as needed for itching.   Yes Historical Provider, MD  furosemide (LASIX) 20 MG tablet Take 1 tablet (20 mg total) by mouth daily. 09/18/16  Yes Katha HammingSnehalatha Konidena, MD  gabapentin (NEURONTIN) 100 MG capsule Take 2 capsules by mouth 2 (two) times daily. Take 2 capsules (200mg ) twice a day 02/23/16  Yes Historical Provider, MD  insulin aspart protamine- aspart (NOVOLOG MIX 70/30) (70-30) 100 UNIT/ML injection Inject 0.38 mLs (38 Units total) into the skin daily with breakfast. Patient taking differently: Inject 42 Units into the skin daily with breakfast.  08/26/16  Yes Enedina FinnerSona Patel, MD  insulin aspart protamine- aspart (NOVOLOG MIX 70/30) (70-30) 100 UNIT/ML injection Inject 0.15 mLs (15 Units total) into the skin daily with supper. Patient taking differently: Inject 17 Units into the skin daily with supper.  09/17/16  Yes Katha HammingSnehalatha Konidena, MD  KLOR-CON M20 20 MEQ tablet Take 1 tablet by mouth daily. 02/23/16  Yes Historical Provider, MD  lisinopril (PRINIVIL,ZESTRIL) 2.5 MG tablet Take 1 tablet (2.5 mg total) by mouth daily. 09/18/16  Yes Katha HammingSnehalatha Konidena, MD  loratadine (CLARITIN) 10 MG tablet Take 10 mg by mouth daily.  Yes Historical Provider, MD  Vitamin D, Ergocalciferol, (DRISDOL) 50000 units CAPS capsule Take 1 capsule by mouth once a week. 03/26/16  Yes Historical Provider, MD    No Known Allergies  History reviewed. No pertinent family history.  Social History Social History  Substance Use Topics  . Smoking status: Former Smoker    Types: Cigarettes  . Smokeless tobacco: Never Used  . Alcohol use No    Review of  Systems  Constitutional: Negative for fever. Eyes: Negative for visual changes. ENT: Negative for sore throat. Cardiovascular: Negative for chest pain. Respiratory: Negative for shortness of breath. Gastrointestinal: Negative for abdominal pain, vomiting and diarrhea. Genitourinary: Negative for dysuria. Musculoskeletal: Negative for back pain.  Other for left foot pain as per history of present illness. Skin: Negative for rash. Neurological: Negative for headache.  Negative for any new weakness or numbness. 10 point Review of Systems otherwise negative ____________________________________________   PHYSICAL EXAM:  VITAL SIGNS: ED Triage Vitals  Enc Vitals Group     BP 11/09/16 1219 (!) 127/55     Pulse Rate 11/09/16 1219 85     Resp 11/09/16 1219 17     Temp 11/09/16 1219 98.9 F (37.2 C)     Temp Source 11/09/16 1219 Oral     SpO2 11/09/16 1219 96 %     Weight 11/09/16 1219 162 lb (73.5 kg)     Height 11/09/16 1219 5\' 2"  (1.575 m)     Head Circumference --      Peak Flow --      Pain Score 11/09/16 1614 0     Pain Loc --      Pain Edu? --      Excl. in GC? --      Constitutional: Alert and oriented. Well appearing and in no distress. HEENT   Head: Normocephalic and atraumatic.      Eyes: Conjunctivae are normal. PERRL. Normal extraocular movements.      Ears:         Nose: No congestion/rhinnorhea.   Mouth/Throat: Mucous membranes are moist.   Neck: No stridor.  Nontender C-spine to palpation or range of motion. Cardiovascular/Chest: Normal rate, regular rhythm.  No murmurs, rubs, or gallops. Respiratory: Normal respiratory effort without tachypnea nor retractions. Breath sounds are clear and equal bilaterally. No wheezes/rales/rhonchi. Gastrointestinal: Soft. No distention, no guarding, no rebound. Nontender.    Genitourinary/rectal:Deferred Musculoskeletal: Nontender with normal range of motion in all extremities. Left heel has a deep ulcer with  surrounding redness extending to the foot and up the calf with 1+ pitting edema of the left leg.  Left foot is moderately tender. Right foot and leg are normal appearance and nontender. Neurologic:  Normal speech and language. No gross or focal neurologic deficits are appreciated. Skin:  Skin is warm, dry and intact. Redness of the left foot and up the left calf/posterior leg. Psychiatric: Mood and affect are normal. Speech and behavior are normal. Patient exhibits appropriate insight and judgment.   ____________________________________________  LABS (pertinent positives/negatives)  Labs Reviewed  CBC WITH DIFFERENTIAL/PLATELET - Abnormal; Notable for the following:       Result Value   WBC 19.4 (*)    Hemoglobin 11.3 (*)    RDW 15.9 (*)    Neutro Abs 17.6 (*)    All other components within normal limits  COMPREHENSIVE METABOLIC PANEL - Abnormal; Notable for the following:    Glucose, Bld 145 (*)    BUN 31 (*)    Creatinine,  Ser 1.20 (*)    Albumin 2.9 (*)    ALT 13 (*)    GFR calc non Af Amer 42 (*)    GFR calc Af Amer 48 (*)    All other components within normal limits  TROPONIN I - Abnormal; Notable for the following:    Troponin I 0.05 (*)    All other components within normal limits  CK - Abnormal; Notable for the following:    Total CK 368 (*)    All other components within normal limits  GLUCOSE, CAPILLARY - Abnormal; Notable for the following:    Glucose-Capillary 160 (*)    All other components within normal limits  CULTURE, BLOOD (ROUTINE X 2)  CULTURE, BLOOD (ROUTINE X 2)  URINALYSIS COMPLETEWITH MICROSCOPIC ([facility] ONLY)  LACTIC ACID, PLASMA  LACTIC ACID, PLASMA    ____________________________________________    EKG I, Governor Rooks, MD, the attending physician have personally viewed and interpreted all ECGs.  84 bpm. Normal sinus rhythm with first degree AV block. ST deviation. LVH. Nonspecific intraventricular conduction delay. Nonspecific ST and  T-wave ____________________________________________  RADIOLOGY All Xrays were viewed by me. Imaging interpreted by Radiologist.  X-ray left foot:  IMPRESSION: Left great toe proximal phalanx linear lucency concerning for subtle nondisplaced fracture. Recommend correlation for point tenderness in this region.  Severe osteopenia and diffuse osteoarthritis  Peripheral atherosclerosis  Plantar calcaneal spur. __________________________________________  PROCEDURES  Procedure(s) performed: None  Critical Care performed: None  ____________________________________________   ED COURSE / ASSESSMENT AND PLAN  Pertinent labs & imaging results that were available during my care of the patient were reviewed by me and considered in my medical decision making (see chart for details).   Initially patient here for evaluation after fall. From a traumatic perspective, no evidence of traumatic injury. CT scan of the head was reassuring. C-spine cleared clinically. Neurologic exam intact now, brother stated that he was a little confused.  The brother alerted me to look at the foot and the heel has a terrible ulcer with streaking redness concerning for infection and possibly sepsis given elevated wbc.  No fever, or tachycardia or hypotension.  Will approach fluid resuscitation clinically. Started on vancomycin and zosyn for wound infection.  No signs of osteo on xray.  No point tenderness for me in area concerning for possible fx on xray.  No indication for emergency podiatry consultation from the ER -- may need consultation in the hospital.   CONSULTATIONS:  Hospitalist for admission.   Patient / Family / Caregiver informed of clinical course, medical decision-making process, and agree with plan.    ___________________________________________   FINAL CLINICAL IMPRESSION(S) / ED DIAGNOSES   Final diagnoses:  Ulcer of left foot, unspecified ulcer stage (HCC)  Cellulitis of foot,  left              Note: This dictation was prepared with Dragon dictation. Any transcriptional errors that result from this process are unintentional    Governor Rooks, MD 11/09/16 1734

## 2016-11-10 LAB — CBC
HEMATOCRIT: 27.7 % — AB (ref 35.0–47.0)
Hemoglobin: 9.1 g/dL — ABNORMAL LOW (ref 12.0–16.0)
MCH: 29.4 pg (ref 26.0–34.0)
MCHC: 32.8 g/dL (ref 32.0–36.0)
MCV: 89.5 fL (ref 80.0–100.0)
Platelets: 195 10*3/uL (ref 150–440)
RBC: 3.09 MIL/uL — ABNORMAL LOW (ref 3.80–5.20)
RDW: 15.9 % — AB (ref 11.5–14.5)
WBC: 14.7 10*3/uL — ABNORMAL HIGH (ref 3.6–11.0)

## 2016-11-10 LAB — HEMOGLOBIN A1C
Hgb A1c MFr Bld: 8.5 % — ABNORMAL HIGH (ref 4.8–5.6)
Mean Plasma Glucose: 197 mg/dL

## 2016-11-10 LAB — GLUCOSE, CAPILLARY
GLUCOSE-CAPILLARY: 218 mg/dL — AB (ref 65–99)
GLUCOSE-CAPILLARY: 240 mg/dL — AB (ref 65–99)
GLUCOSE-CAPILLARY: 317 mg/dL — AB (ref 65–99)
GLUCOSE-CAPILLARY: 321 mg/dL — AB (ref 65–99)
Glucose-Capillary: 201 mg/dL — ABNORMAL HIGH (ref 65–99)

## 2016-11-10 LAB — BLOOD CULTURE ID PANEL (REFLEXED)
Acinetobacter baumannii: NOT DETECTED
CANDIDA ALBICANS: NOT DETECTED
CANDIDA GLABRATA: NOT DETECTED
CANDIDA PARAPSILOSIS: NOT DETECTED
CANDIDA TROPICALIS: NOT DETECTED
Candida krusei: NOT DETECTED
ENTEROBACTER CLOACAE COMPLEX: NOT DETECTED
ENTEROBACTERIACEAE SPECIES: NOT DETECTED
Enterococcus species: DETECTED — AB
Escherichia coli: NOT DETECTED
Haemophilus influenzae: NOT DETECTED
KLEBSIELLA OXYTOCA: NOT DETECTED
KLEBSIELLA PNEUMONIAE: NOT DETECTED
Listeria monocytogenes: NOT DETECTED
METHICILLIN RESISTANCE: DETECTED — AB
Neisseria meningitidis: NOT DETECTED
PSEUDOMONAS AERUGINOSA: NOT DETECTED
Proteus species: NOT DETECTED
STREPTOCOCCUS AGALACTIAE: NOT DETECTED
STREPTOCOCCUS PNEUMONIAE: NOT DETECTED
Serratia marcescens: NOT DETECTED
Staphylococcus aureus (BCID): NOT DETECTED
Staphylococcus species: DETECTED — AB
Streptococcus pyogenes: NOT DETECTED
Streptococcus species: NOT DETECTED
Vancomycin resistance: NOT DETECTED

## 2016-11-10 LAB — PREALBUMIN: Prealbumin: 8.5 mg/dL — ABNORMAL LOW (ref 18–38)

## 2016-11-10 LAB — C-REACTIVE PROTEIN: CRP: 15 mg/dL — AB (ref <1.0)

## 2016-11-10 LAB — HIV ANTIBODY (ROUTINE TESTING W REFLEX): HIV SCREEN 4TH GENERATION: NONREACTIVE

## 2016-11-10 MED ORDER — VANCOMYCIN HCL IN DEXTROSE 1-5 GM/200ML-% IV SOLN
1000.0000 mg | INTRAVENOUS | Status: DC
  Administered 2016-11-10 – 2016-11-12 (×3): 1000 mg via INTRAVENOUS
  Filled 2016-11-10 (×4): qty 200

## 2016-11-10 NOTE — Consult Note (Addendum)
      INFECTIOUS DISEASE ATTENDING ADDENDUM:   Date: 11/10/2016  Patient name: [name] [name]  Medical record number: [id number]  Date of birth: Nov 23, 1934    Was alerted to this patient's bacteremia via the vigilanz system which generates infectious disease auto consult for patients with staph aureus and enterococcal bacteremias. This patient has both COAG NEG STAPH and [facility]. He also has a diabetic foot ulcer without clear-cut osteomyelitis by plain films.   I WOULD GET MRI O FOOT   I WOULD NOT YET CONSIDER TEE UNTIL WE KNOW WHAT IS GOING TO BE DONE ABOUT HIS FOOT     diabetic foot ulcer: Would strongly recommend getting an MRI with contrast of the left foot and heel to look for osteomyelitis. We'll also consult orthopedic surgery.  I will paged the hospitalist on-call call and to touch base with them.  ADDENDUM: THIS WAS ENTEROCOCCUS AND COAG NEG STAPH ON BCID NOT STAPH AUREUS  THE COAG NEG COULD INDEED BE A CONTAMINANT BUT I WOULD BE INCLINED TO TREAT THE ENTEROCOCCUS AS REAL  Paulette BlanchCornelius Van Dam 11/10/2016, 2:23 PM

## 2016-11-10 NOTE — Progress Notes (Signed)
[facility] [place] [facility]   PATIENT NAME: [name] [name]    MRN#:  [id number]  DATE OF BIRTH:  12-Nov-1934  SUBJECTIVE:  Hospital Day: 1 day [name] [name] is a 82 y.o. female presenting with Fall .   Overnight events: No overnight events Interval Events: Complaints of left leg pain and weakness  REVIEW OF SYSTEMS:  CONSTITUTIONAL: No fever,Positive fatigue or weakness.  EYES: No blurred or double vision.  EARS, NOSE, AND THROAT: No tinnitus or ear pain.  RESPIRATORY: No cough, shortness of breath, wheezing or hemoptysis.  CARDIOVASCULAR: No chest pain, orthopnea, edema.  GASTROINTESTINAL: No nausea, vomiting, diarrhea or abdominal pain.  GENITOURINARY: No dysuria, hematuria.  ENDOCRINE: No polyuria, nocturia,  HEMATOLOGY: No anemia, easy bruising or bleeding SKIN: No rash or lesion. MUSCULOSKELETAL: No joint pain or arthritis.  Positive left leg pain NEUROLOGIC: No tingling, numbness, weakness.  PSYCHIATRY: No anxiety or depression.   DRUG ALLERGIES:  No Known Allergies  VITALS:  Blood pressure (!) 99/50, pulse 82, temperature 98.8 F (37.1 C), temperature source Oral, resp. rate 18, height 5\' 2"  (1.575 m), weight 74.8 kg (164 lb 14.4 oz), SpO2 97 %.  PHYSICAL EXAMINATION:  VITAL SIGNS: Vitals:   11/10/16 0413 11/10/16 0738  BP: (!) 128/53 (!) 99/50  Pulse: 87 82  Resp: 18 18  Temp: 98.8 F (37.1 C)    GENERAL:82 y.o.female currently in no acute distress.  HEAD: Normocephalic, atraumatic.  EYES: Pupils equal, round, reactive to light. Extraocular muscles intact. No scleral icterus.  MOUTH: Moist mucosal membrane. Dentition intact. No abscess noted.  EAR, NOSE, THROAT: Clear without exudates. No external lesions.  NECK: Supple. No thyromegaly. No nodules. No JVD.  PULMONARY: Clear to ascultation, without wheeze rails or rhonci. No use of accessory muscles, Good respiratory effort. good air entry bilaterally CHEST: Nontender  to palpation.  CARDIOVASCULAR: S1 and S2. Regular rate and rhythm. No murmurs, rubs, or gallops. No edema. Pedal pulses 2+ bilaterally.  GASTROINTESTINAL: Soft, nontender, nondistended. No masses. Positive bowel sounds. No hepatosplenomegaly.  MUSCULOSKELETAL: No swelling, clubbing, or edema. Range of motion full in all extremities.  NEUROLOGIC: Cranial nerves II through XII are intact. No gross focal neurological deficits. Sensation intact. Reflexes intact.  SKIN: Heel ulcer with necrotic tissue surrounding erythema left leg right-sided smaller lateral malleoli skin breakdown without erythema No ulceration, lesions, rashes, or cyanosis. Skin warm and dry. Turgor intact.  PSYCHIATRIC: Mood, affect within normal limits. The patient is awake, alert and oriented x 3. Insight, judgment intact.      LABORATORY PANEL:   CBC  Recent Labs Lab 11/10/16 0629  WBC 14.7*  HGB 9.1*  HCT 27.7*  PLT 195   ------------------------------------------------------------------------------------------------------------------  Chemistries   Recent Labs Lab 11/09/16 1900  NA 134*  K 4.5  CL 104  CO2 25  GLUCOSE 60*  BUN 32*  CREATININE 1.25*  CALCIUM 8.4*  AST 24  ALT 12*  ALKPHOS 71  BILITOT 0.4   ------------------------------------------------------------------------------------------------------------------  Cardiac Enzymes  Recent Labs Lab 11/09/16 1231  TROPONINI 0.05*   ------------------------------------------------------------------------------------------------------------------  RADIOLOGY:  Ct Head Wo Contrast  Result Date: 11/09/2016 CLINICAL DATA:  Altered mental status with disorientation. Difficulty ambulating. Unwitnessed fall. EXAM: CT HEAD WITHOUT CONTRAST TECHNIQUE: Contiguous axial images were obtained from the base of the skull through the vertex without intravenous contrast. COMPARISON:  April 02, 2016 FINDINGS: Brain: Moderate diffuse atrophy is stable. There  is no intracranial mass, hemorrhage, extra-axial fluid collection, or midline shift. There  is evidence of prior infarct in the medial left occipital lobe. The patient has had a prior infarct in the medial posterior right lentiform nucleus. There is evidence of prior lacunar type infarcts in each pons region, slightly larger on the left than on the right. There is small vessel disease throughout the centra semiovale bilaterally. No acute infarct is appreciable on this study. Vascular: There is no hyperdense vessel. There is calcification in each carotid siphon region. Skull: The bony calvarium appears intact. Sinuses/Orbits: There is mild mucosal thickening in several ethmoid air cells bilaterally. Visualized paranasal sinuses elsewhere clear. Visualized orbits appear symmetric bilaterally. Other: Visualized mastoid air cells are clear. IMPRESSION: Stable atrophy with scattered prior small infarcts as summarized above. There is periventricular small vessel disease in the centra semiovale bilaterally. No acute infarct evident. No mass, hemorrhage, or extra-axial fluid collection. There are foci of calcification in each carotid siphon region. There is mild ethmoid sinus disease bilaterally. Electronically Signed   By: Bretta BangWilliam  Woodruff III M.D.   On: 11/09/2016 13:01   Dg Foot Complete Left  Result Date: 11/09/2016 CLINICAL DATA:  Fall, heel pain and ulcer. EXAM: LEFT FOOT - COMPLETE 3+ VIEW COMPARISON:  None available FINDINGS: Severe osteopenia noted throughout the left foot. Peripheral atherosclerosis present. Degenerative changes noted of the MTP joints and the interphalangeal joints compatible with osteoarthritis. Normal alignment. Linear lucency through the left great toe proximal phalanx, suspicious for a nondisplaced fracture. Recommend correlation for point tenderness in this region. Small plantar calcaneal spur.  Calcaneus appears intact. IMPRESSION: Left great toe proximal phalanx linear lucency  concerning for subtle nondisplaced fracture. Recommend correlation for point tenderness in this region. Severe osteopenia and diffuse osteoarthritis Peripheral atherosclerosis Plantar calcaneal spur. Electronically Signed   By: Judie PetitM.  Shick M.D.   On: 11/09/2016 16:54    EKG:   Orders placed or performed during the hospital encounter of 11/09/16  . EKG 12-Lead  . EKG 12-Lead  . ED EKG  . ED EKG    ASSESSMENT AND PLAN:   [name] [name] is a 82 y.o. female presenting with Fall . Admitted 11/09/2016 : Day #: 1 day 1. Diabetic foot ulcer with probable osteomyelitis: Continue current antibiotics follow culture data will consult podiatry MRI pending 2. Type 2 diabetes continue coverage with diabetic coordinator 3. Hypertension lisinopril 4. Hyperlipidemia unspecified statin therapy   All the records are reviewed and case discussed with Care Management/Social Workerr. Management plans discussed with the patient, family and they are in agreement.  CODE STATUS: full TOTAL TIME TAKING CARE OF THIS PATIENT: 28 minutes.   POSSIBLE D/C IN 2-3DAYS, DEPENDING ON CLINICAL CONDITION.   Hower,  Mardi MainlandDavid K M.D on 11/10/2016 at 11:55 AM  Between 7am to 6pm - Pager - [phone]  After 6pm: House Pager: - [phone]  [facility]  Office  [phone]  CC: Primary care physician; Barbette ReichmannHANDE,VISHWANATH, MD

## 2016-11-10 NOTE — Progress Notes (Signed)
PHARMACY - PHYSICIAN COMMUNICATION CRITICAL VALUE ALERT - BLOOD CULTURE IDENTIFICATION (BCID)  Results for orders placed or performed during the hospital encounter of 11/09/16  Blood Culture ID Panel (Reflexed) (Collected: 11/09/2016  4:17 PM)  Result Value Ref Range   Enterococcus species DETECTED (A) NOT DETECTED   Vancomycin resistance NOT DETECTED NOT DETECTED   Listeria monocytogenes NOT DETECTED NOT DETECTED   Staphylococcus species DETECTED (A) NOT DETECTED   Staphylococcus aureus NOT DETECTED NOT DETECTED   Methicillin resistance DETECTED (A) NOT DETECTED   Streptococcus species NOT DETECTED NOT DETECTED   Streptococcus agalactiae NOT DETECTED NOT DETECTED   Streptococcus pneumoniae NOT DETECTED NOT DETECTED   Streptococcus pyogenes NOT DETECTED NOT DETECTED   Acinetobacter baumannii NOT DETECTED NOT DETECTED   Enterobacteriaceae species NOT DETECTED NOT DETECTED   Enterobacter cloacae complex NOT DETECTED NOT DETECTED   Escherichia coli NOT DETECTED NOT DETECTED   Klebsiella oxytoca NOT DETECTED NOT DETECTED   Klebsiella pneumoniae NOT DETECTED NOT DETECTED   Proteus species NOT DETECTED NOT DETECTED   Serratia marcescens NOT DETECTED NOT DETECTED   Haemophilus influenzae NOT DETECTED NOT DETECTED   Neisseria meningitidis NOT DETECTED NOT DETECTED   Pseudomonas aeruginosa NOT DETECTED NOT DETECTED   Candida albicans NOT DETECTED NOT DETECTED   Candida glabrata NOT DETECTED NOT DETECTED   Candida krusei NOT DETECTED NOT DETECTED   Candida parapsilosis NOT DETECTED NOT DETECTED   Candida tropicalis NOT DETECTED NOT DETECTED    Name of physician (or Provider) Contacted: Dr. Clint GuyHower  Changes to prescribed antibiotics required: None. Will continue vancomycin.  Cindi CarbonMary M Jakyra Kenealy, PharmD, BCPS Clinical Pharmacist 11/10/2016  2:16 PM

## 2016-11-11 ENCOUNTER — Inpatient Hospital Stay: Payer: Commercial Managed Care - HMO

## 2016-11-11 LAB — GLUCOSE, CAPILLARY
GLUCOSE-CAPILLARY: 221 mg/dL — AB (ref 65–99)
GLUCOSE-CAPILLARY: 296 mg/dL — AB (ref 65–99)
Glucose-Capillary: 241 mg/dL — ABNORMAL HIGH (ref 65–99)
Glucose-Capillary: 189 mg/dL — ABNORMAL HIGH (ref 65–99)

## 2016-11-11 LAB — SEDIMENTATION RATE: Sed Rate: 107 mm/hr — ABNORMAL HIGH (ref 0–30)

## 2016-11-11 LAB — C-REACTIVE PROTEIN: CRP: 11.7 mg/dL — AB (ref <1.0)

## 2016-11-11 LAB — PREALBUMIN: PREALBUMIN: 6.8 mg/dL — AB (ref 18–38)

## 2016-11-11 LAB — CREATININE, SERUM
CREATININE: 1.32 mg/dL — AB (ref 0.44–1.00)
GFR calc Af Amer: 43 mL/min — ABNORMAL LOW (ref >60)
GFR calc non Af Amer: 37 mL/min — ABNORMAL LOW (ref >60)

## 2016-11-11 MED ORDER — INSULIN ASPART 100 UNIT/ML ~~LOC~~ SOLN
0.0000 [IU] | Freq: Every day | SUBCUTANEOUS | Status: DC
  Administered 2016-11-13 – 2016-11-14 (×2): 2 [IU] via SUBCUTANEOUS
  Filled 2016-11-11 (×2): qty 2

## 2016-11-11 MED ORDER — COLLAGENASE 250 UNIT/GM EX OINT
TOPICAL_OINTMENT | Freq: Every day | CUTANEOUS | Status: DC
  Administered 2016-11-11: 1 via TOPICAL
  Administered 2016-11-12 – 2016-11-15 (×3): via TOPICAL
  Filled 2016-11-11: qty 30

## 2016-11-11 MED ORDER — INSULIN GLARGINE 100 UNIT/ML ~~LOC~~ SOLN
20.0000 [IU] | Freq: Every day | SUBCUTANEOUS | Status: DC
  Filled 2016-11-11: qty 0.2

## 2016-11-11 MED ORDER — INSULIN ASPART 100 UNIT/ML ~~LOC~~ SOLN
0.0000 [IU] | Freq: Three times a day (TID) | SUBCUTANEOUS | Status: DC
  Administered 2016-11-11: 8 [IU] via SUBCUTANEOUS
  Administered 2016-11-11 – 2016-11-12 (×2): 5 [IU] via SUBCUTANEOUS
  Administered 2016-11-12 (×2): 3 [IU] via SUBCUTANEOUS
  Administered 2016-11-13: 8 [IU] via SUBCUTANEOUS
  Administered 2016-11-13: 3 [IU] via SUBCUTANEOUS
  Administered 2016-11-13: 5 [IU] via SUBCUTANEOUS
  Administered 2016-11-14 – 2016-11-15 (×4): 3 [IU] via SUBCUTANEOUS
  Filled 2016-11-11: qty 3
  Filled 2016-11-11: qty 8
  Filled 2016-11-11: qty 5
  Filled 2016-11-11: qty 3
  Filled 2016-11-11 (×2): qty 5
  Filled 2016-11-11: qty 3
  Filled 2016-11-11: qty 8
  Filled 2016-11-11 (×4): qty 3

## 2016-11-11 MED ORDER — INSULIN ASPART PROT & ASPART (70-30 MIX) 100 UNIT/ML ~~LOC~~ SUSP
19.0000 [IU] | Freq: Every day | SUBCUTANEOUS | Status: DC
  Administered 2016-11-11 – 2016-11-15 (×4): 19 [IU] via SUBCUTANEOUS
  Filled 2016-11-11 (×3): qty 19
  Filled 2016-11-11: qty 42
  Filled 2016-11-11: qty 19

## 2016-11-11 MED ORDER — INSULIN ASPART PROT & ASPART (70-30 MIX) 100 UNIT/ML ~~LOC~~ SUSP
42.0000 [IU] | Freq: Every day | SUBCUTANEOUS | Status: DC
  Administered 2016-11-12 – 2016-11-15 (×3): 42 [IU] via SUBCUTANEOUS
  Filled 2016-11-11 (×2): qty 42

## 2016-11-11 MED ORDER — GLUCERNA SHAKE PO LIQD
237.0000 mL | Freq: Three times a day (TID) | ORAL | Status: DC
  Administered 2016-11-11 – 2016-11-15 (×6): 237 mL via ORAL

## 2016-11-11 NOTE — Progress Notes (Signed)
Clinical Social Worker (CSW) received consult for medication assistance. RN case manager consult is in place. Please reconsult if future social work needs arise. CSW signing off.   Burk Hoctor, LCSW [phone]  

## 2016-11-11 NOTE — Progress Notes (Signed)
Initial Nutrition Assessment  DOCUMENTATION CODES:   Not applicable  INTERVENTION:   Glucerna Shake po TID, each supplement provides 220 kcal and 10 grams of protein  NUTRITION DIAGNOSIS:   Increased nutrient needs related to wound healing as evidenced by increased protein needs.   GOAL:   Patient will meet greater than or equal to 90% of their needs  MONITOR:   PO intake, Supplement acceptance  REASON FOR ASSESSMENT:   Consult Wound healing  ASSESSMENT:   82 y.o. female with a known history of Diabetes mellitus type 2 uncontrolled, essential hypertension, stroke, CKD III, HF, comes in because of the fall at home and diabetic foot ulcer   Met with patient in room today. Patient reports good appetite pta and eating 100% of meals in hospital. Spoke to patient about her increased protein needs for wound healing. Patient not willing to do Prostats but does like Ensure; agreed to try Glucerna. Patient has h/o intermittent constipation. Patient has diabetic foot ulcer on L foot. Wt stable.     Medications reviewed and include: aspirin, atorvastatin, lovenox, lasix, insulin, metronidazole, vancomycin, KCl, Vitamin D   Labs reviewed: creat 1.32(H) WBC 14.7(H), Hgb 9.1(L), Hct 27.7(L)-labs from 11/23 Na 134(L), BUN 32(H), creat 1.25(H), Ca 8.4 (l) adj 9.68 wnl, Alb 2.4(L), Alt 12(L), prealb 8.5(L), CK 368(H), CRP 15(H)- labs from 11/22 BG- 60,145-11/22  Nutrition-Focused physical exam completed. Findings are no fat depletion, no muscle depletion, and no edema.   Diet Order:  Diet Carb Modified Fluid consistency: Thin; Room service appropriate? Yes  Skin:  Wound (see comment) (2 unstageable PUs, cellulitis)  Last BM:  11/22  Height:   Ht Readings from Last 1 Encounters:  11/09/16 _0  (1.575 m)    Weight:   Wt Readings from Last 1 Encounters:  11/09/16 164 lb 14.4 oz (74.8 kg)    Ideal Body Weight:  50 kg  BMI:  Body mass index is 30.16 kg/m.  Estimated  Nutritional Needs:   Kcal:  1250-1500kcal/day   Protein:  90-120g/day   Fluid:  1.5L/day   EDUCATION NEEDS:   No education needs identified at this time  Koleen Distance, RD, LDN

## 2016-11-11 NOTE — Consult Note (Addendum)
Pharmacy Antibiotic Note  [name] [name] is a 82 y.o. female admitted on 11/09/2016 with a fall/ diabetic foot infection.  Pharmacy has been consulted for vancomycin dosing.  Plan: Patients SCr increased from 1.32 to 1.51. Will draw a random level 24hrs from last dose. Will push out vancomycin 1000mg  IV every 24 hours to 5:00AM tomorrow to allow dose to be adjusted based on random level. Goal trough 15-20 mcg/mL.   [name] also receiving metronidazole 500mg  PO Q8h.   Will continue to monitor renal function and adjust accordingly.   Height: 5\' 2"  (157.5 cm) Weight: 164 lb 14.4 oz (74.8 kg) IBW/kg (Calculated) : 50.1  Temp (24hrs), Avg:100.1 F (37.8 C), Min:98.9 F (37.2 C), Max:102.2 F (39 C)   Recent Labs Lab 11/09/16 1231 11/09/16 1648 11/09/16 1900 11/10/16 0629 11/11/16 0621  WBC 19.4*  --  18.3* 14.7*  --   CREATININE 1.20*  --  1.25*  --  1.32*  LATICACIDVEN  --  1.2  --   --   --     Estimated Creatinine Clearance: 32.2 mL/min (by C-G formula based on SCr of 1.32 mg/dL (H)).    No Known Allergies  Antimicrobials this admission: zosyn 11/22 >> 11/22 vancomycin 11/22 >>  Metronidazole 11/22>> Ceftriaxone 11/22>>11/22  Dose adjustments this admission:   Microbiology results: 11/22 BCx: enterococcus species, staphylococcus species, methicillin resistance    Thank you for allowing pharmacy to be a part of this patient's care.  Delsa BernKelly m Randalyn Ahmed, PharmD 11/11/2016 10:21 AM

## 2016-11-11 NOTE — Care Management Important Message (Signed)
Important Message  Patient Details  Name: [name] [name] MRN: [id number] Date of Birth: Apr 28, 1934   Medicare Important Message Given:  Yes    Gwenette GreetBrenda S Chequita Mofield, RN 11/11/2016, 9:33 AM

## 2016-11-11 NOTE — Consult Note (Signed)
WOC Nurse wound consult note Reason for Consult: Unstageable pressure injury to left heel.  Neuropathic component as well.  Awaiting MRI to rule out osteomyelitis.  Awaiting podiatry consult.  Unstageable pressure injury to right lateral malleolus Wound type:Pressure and neuropathic Pressure Ulcer POA: Yes Measurement: Left heel 4 cm x 4.5 cm fluctuant eschar to wound bed.   Right lateral malleolus  1 cm x 1 cm wound bed is 100% eschar Wound bed:100% devitalized tissue Drainage (amount, consistency, odor) Minimal serosanguinous  Necrotic odor to left heel.  None to right malleolus Periwound:Both have periwound erythema .  Left heel with peeling epithelium present circumferentially.   Dressing procedure/placement/frequency: Awaiting podiatry consult and MRI left heel.  Will not follow at this time, awaiting podiatry consult but available to patient, medical and nursing teams as needed.  Maple HudsonKaren Glynda Soliday RN BSN CWON Pager [phone]

## 2016-11-11 NOTE — Progress Notes (Signed)
Clinical Child psychotherapistocial Worker (CSW) received verbal consult from RN that patient's family member would like to talk to CSW. CSW attempted to meet with patient however she appeared confused. CSW contacted patient's daughter Addison BaileyStephanie Parker to discuss D/C plan. Someone answered the phone and stated that she would not tell CSW who she was and would not confirm that she was QatarStephanie. This person reported that she [facility]. CSW did not give this person patient's name because she would not confirm if she was QatarStephanie. CSW will continue to follow and assist as needed.   Baker Hughes IncorporatedBailey Nahomi Hegner, LCSW [phone]

## 2016-11-11 NOTE — Progress Notes (Signed)
      INFECTIOUS DISEASE ATTENDING ADDENDUM:   Date: 11/11/2016  Patient name: [name] [name]  Medical record number: [id number]  Date of birth: Dec 06, 1934    Patient's MRI did not show osteomyelitis.  He appears to be growing an organism in the 1 which fired with coag negative staph and enterococcus on the bcid system.  Will touch base with  the primary tomorrow  [phone]  Acey LavCornelius Van Dam 11/11/2016, 5:51 PM

## 2016-11-11 NOTE — Progress Notes (Addendum)
Inpatient Diabetes Program Recommendations  AACE/ADA: New Consensus Statement on Inpatient Glycemic Control (2015)  Target Ranges:  Prepandial:   less than 140 mg/dL      Peak postprandial:   less than 180 mg/dL (1-2 hours)      Critically ill patients:  140 - 180 mg/dL   Lab Results  Component Value Date   GLUCAP 241 (H) 11/11/2016   HGBA1C 8.5 (H) 11/09/2016    Review of Glycemic Control  Results for [name] [name], [name] (MRN [id number]) as of 11/11/2016 09:26  Ref. Range 11/10/2016 12:02 11/10/2016 16:31 11/10/2016 21:16 11/10/2016 21:17 11/11/2016 07:29  Glucose-Capillary Latest Ref Range: 65 - 99 mg/dL 132218 (H) 440240 (H) 102321 (H) 317 (H) 241 (H)    Diabetes history: Type 2 Outpatient Diabetes medications: 70/30 42units qam, 17 units with supper Current orders for Inpatient glycemic control: recently switched from Novolog 70/30 38 units with breakfast and Novolog 70/30 15 units with supper to Lantus 20 units at hs, Novolog 0-15 units with meals, Novolog 0-5 units qhs  Inpatient Diabetes Program Recommendations: Patient has been seeing Dr. Marcello FennelHande for diabetes management.  The last time she saw Dr. Tedd SiasSolum was June 2016.  Patient was taking Novolog 70/30 insulin at home.  Please consider keeping this patient on 70/30 insulin here in the hospital and not transferring to the Lantus plus correction.   Consider increasing  Novolog 70/30, 42 units qam, and Novolog 70/30 19 units with supper and continue Novolog correction as ordered.  She will need to go home on Novolog 70/30 and continuing at home will allows us to determine best dose.   Susette RacerJulie Rodney Wigger, RN, BA, MHA, CDE Diabetes Coordinator Inpatient Diabetes Program  [phone] (Team Pager) [phone] [facility] Office) 11/11/2016 9:57 AM

## 2016-11-11 NOTE — Progress Notes (Signed)
[facility] [place] [facility]   PATIENT NAME: [name] [name]    MRN#:  [id number]  DATE OF BIRTH:  23-Jun-1934  SUBJECTIVE:  Hospital Day: 2 days [name] [name] is a 82 y.o. female presenting with Fall .   Overnight events: No overnight events Interval Events: Complaints of left leg pain and weakness  REVIEW OF SYSTEMS:  CONSTITUTIONAL: No fever,Positive fatigue or weakness.  EYES: No blurred or double vision.  EARS, NOSE, AND THROAT: No tinnitus or ear pain.  RESPIRATORY: No cough, shortness of breath, wheezing or hemoptysis.  CARDIOVASCULAR: No chest pain, orthopnea, edema.  GASTROINTESTINAL: No nausea, vomiting, diarrhea or abdominal pain.  GENITOURINARY: No dysuria, hematuria.  ENDOCRINE: No polyuria, nocturia,  HEMATOLOGY: No anemia, easy bruising or bleeding SKIN: No rash or lesion. MUSCULOSKELETAL: No joint pain or arthritis.  Positive left leg pain NEUROLOGIC: No tingling, numbness, weakness.  PSYCHIATRY: No anxiety or depression.   DRUG ALLERGIES:  No Known Allergies  VITALS:  Blood pressure (!) 143/58, pulse 81, temperature 98.9 F (37.2 C), temperature source Oral, resp. rate 16, height 5\' 2"  (1.575 m), weight 74.8 kg (164 lb 14.4 oz), SpO2 97 %.  PHYSICAL EXAMINATION:  VITAL SIGNS: Vitals:   11/11/16 0420 11/11/16 0731  BP: (!) 147/52 (!) 143/58  Pulse: 77 81  Resp: 16   Temp: 99.1 F (37.3 C) 98.9 F (37.2 C)   GENERAL:82 y.o.female currently in no acute distress.  HEAD: Normocephalic, atraumatic.  EYES: Pupils equal, round, reactive to light. Extraocular muscles intact. No scleral icterus.  MOUTH: Moist mucosal membrane. Dentition intact. No abscess noted.  EAR, NOSE, THROAT: Clear without exudates. No external lesions.  NECK: Supple. No thyromegaly. No nodules. No JVD.  PULMONARY: Clear to ascultation, without wheeze rails or rhonci. No use of accessory muscles, Good respiratory effort. good air entry  bilaterally CHEST: Nontender to palpation.  CARDIOVASCULAR: S1 and S2. Regular rate and rhythm. No murmurs, rubs, or gallops. No edema. Pedal pulses 2+ bilaterally.  GASTROINTESTINAL: Soft, nontender, nondistended. No masses. Positive bowel sounds. No hepatosplenomegaly.  MUSCULOSKELETAL: No swelling, clubbing, or edema. Range of motion full in all extremities.  NEUROLOGIC: Cranial nerves II through XII are intact. No gross focal neurological deficits. Sensation intact. Reflexes intact.  SKIN: Heel ulcer with necrotic tissue surrounding erythema left leg right-sided smaller lateral malleoli skin breakdown without erythema No ulceration, lesions, rashes, or cyanosis. Skin warm and dry. Turgor intact.  PSYCHIATRIC: Mood, affect within normal limits. The patient is awake, alert and oriented x 3. Insight, judgment intact.      LABORATORY PANEL:   CBC  Recent Labs Lab 11/10/16 0629  WBC 14.7*  HGB 9.1*  HCT 27.7*  PLT 195   ------------------------------------------------------------------------------------------------------------------  Chemistries   Recent Labs Lab 11/09/16 1900 11/11/16 0621  NA 134*  --   K 4.5  --   CL 104  --   CO2 25  --   GLUCOSE 60*  --   BUN 32*  --   CREATININE 1.25* 1.32*  CALCIUM 8.4*  --   AST 24  --   ALT 12*  --   ALKPHOS 71  --   BILITOT 0.4  --    ------------------------------------------------------------------------------------------------------------------  Cardiac Enzymes  Recent Labs Lab 11/09/16 1231  TROPONINI 0.05*   ------------------------------------------------------------------------------------------------------------------  RADIOLOGY:  Ct Head Wo Contrast  Result Date: 11/09/2016 CLINICAL DATA:  Altered mental status with disorientation. Difficulty ambulating. Unwitnessed fall. EXAM: CT HEAD WITHOUT CONTRAST TECHNIQUE: Contiguous axial images were  obtained from the base of the skull through the vertex without  intravenous contrast. COMPARISON:  April 02, 2016 FINDINGS: Brain: Moderate diffuse atrophy is stable. There is no intracranial mass, hemorrhage, extra-axial fluid collection, or midline shift. There is evidence of prior infarct in the medial left occipital lobe. The patient has had a prior infarct in the medial posterior right lentiform nucleus. There is evidence of prior lacunar type infarcts in each pons region, slightly larger on the left than on the right. There is small vessel disease throughout the centra semiovale bilaterally. No acute infarct is appreciable on this study. Vascular: There is no hyperdense vessel. There is calcification in each carotid siphon region. Skull: The bony calvarium appears intact. Sinuses/Orbits: There is mild mucosal thickening in several ethmoid air cells bilaterally. Visualized paranasal sinuses elsewhere clear. Visualized orbits appear symmetric bilaterally. Other: Visualized mastoid air cells are clear. IMPRESSION: Stable atrophy with scattered prior small infarcts as summarized above. There is periventricular small vessel disease in the centra semiovale bilaterally. No acute infarct evident. No mass, hemorrhage, or extra-axial fluid collection. There are foci of calcification in each carotid siphon region. There is mild ethmoid sinus disease bilaterally. Electronically Signed   By: Bretta BangWilliam  Woodruff III M.D.   On: 11/09/2016 13:01   Dg Foot Complete Left  Result Date: 11/09/2016 CLINICAL DATA:  Fall, heel pain and ulcer. EXAM: LEFT FOOT - COMPLETE 3+ VIEW COMPARISON:  None available FINDINGS: Severe osteopenia noted throughout the left foot. Peripheral atherosclerosis present. Degenerative changes noted of the MTP joints and the interphalangeal joints compatible with osteoarthritis. Normal alignment. Linear lucency through the left great toe proximal phalanx, suspicious for a nondisplaced fracture. Recommend correlation for point tenderness in this region. Small plantar  calcaneal spur.  Calcaneus appears intact. IMPRESSION: Left great toe proximal phalanx linear lucency concerning for subtle nondisplaced fracture. Recommend correlation for point tenderness in this region. Severe osteopenia and diffuse osteoarthritis Peripheral atherosclerosis Plantar calcaneal spur. Electronically Signed   By: Judie PetitM.  Shick M.D.   On: 11/09/2016 16:54    EKG:   Orders placed or performed during the hospital encounter of 11/09/16  . EKG 12-Lead  . EKG 12-Lead  . ED EKG  . ED EKG    ASSESSMENT AND PLAN:   [name] [name] is a 82 y.o. female presenting with Fall . Admitted 11/09/2016 : Day #: 2 days  1. Bacteremia: continue current antibiotics - follow cultures, 1/4 bottles only - seems possible contaminat 2. Diabetic foot ulcer with probable osteomyelitis: Continue current antibiotics follow culture data will consult podiatry MRI pending 3. Type 2 diabetes, poorly controlled: convert to lantus with sliding coverage - adjust as needed 4. Hypertension lisinopril 5. Hyperlipidemia unspecified statin therapy   All the records are reviewed and case discussed with Care Management/Social Workerr. Management plans discussed with the patient, family and they are in agreement.  CODE STATUS: full TOTAL TIME TAKING CARE OF THIS PATIENT: 28 minutes.   POSSIBLE D/C IN 2-3DAYS, DEPENDING ON CLINICAL CONDITION.   Hower,  Mardi MainlandDavid K M.D on 11/11/2016 at 8:27 AM  Between 7am to 6pm - Pager - [phone]  After 6pm: House Pager: - [phone]  [facility]  Office  [phone]  CC: Primary care physician; Barbette ReichmannHANDE,VISHWANATH, MD

## 2016-11-12 ENCOUNTER — Inpatient Hospital Stay: Admit: 2016-11-12 | Payer: Commercial Managed Care - HMO

## 2016-11-12 DIAGNOSIS — L97409 Non-pressure chronic ulcer of unspecified heel and midfoot with unspecified severity: Secondary | ICD-10-CM

## 2016-11-12 DIAGNOSIS — E10621 Type 1 diabetes mellitus with foot ulcer: Secondary | ICD-10-CM

## 2016-11-12 LAB — CBC
HEMATOCRIT: 27.1 % — AB (ref 35.0–47.0)
Hemoglobin: 9 g/dL — ABNORMAL LOW (ref 12.0–16.0)
MCH: 30.1 pg (ref 26.0–34.0)
MCHC: 33.3 g/dL (ref 32.0–36.0)
MCV: 90.3 fL (ref 80.0–100.0)
Platelets: 221 10*3/uL (ref 150–440)
RBC: 3 MIL/uL — ABNORMAL LOW (ref 3.80–5.20)
RDW: 15.9 % — AB (ref 11.5–14.5)
WBC: 9.3 10*3/uL (ref 3.6–11.0)

## 2016-11-12 LAB — CULTURE, BLOOD (ROUTINE X 2)

## 2016-11-12 LAB — GLUCOSE, CAPILLARY
Glucose-Capillary: 154 mg/dL — ABNORMAL HIGH (ref 65–99)
Glucose-Capillary: 174 mg/dL — ABNORMAL HIGH (ref 65–99)
Glucose-Capillary: 203 mg/dL — ABNORMAL HIGH (ref 65–99)
Glucose-Capillary: 185 mg/dL — ABNORMAL HIGH (ref 65–99)

## 2016-11-12 LAB — HEMOGLOBIN A1C
Hgb A1c MFr Bld: 8.6 % — ABNORMAL HIGH (ref 4.8–5.6)
Mean Plasma Glucose: 200 mg/dL

## 2016-11-12 LAB — CREATININE, SERUM
Creatinine, Ser: 1.51 mg/dL — ABNORMAL HIGH (ref 0.44–1.00)
GFR calc Af Amer: 36 mL/min — ABNORMAL LOW (ref >60)
GFR calc non Af Amer: 31 mL/min — ABNORMAL LOW (ref >60)

## 2016-11-12 LAB — VANCOMYCIN, TROUGH: VANCOMYCIN TR: 15 ug/mL (ref 15–20)

## 2016-11-12 MED ORDER — SODIUM CHLORIDE 0.9 % IV SOLN
2.0000 g | Freq: Four times a day (QID) | INTRAVENOUS | Status: DC
  Administered 2016-11-12 – 2016-11-15 (×11): 2 g via INTRAVENOUS
  Filled 2016-11-12 (×16): qty 2000

## 2016-11-12 MED ORDER — ENOXAPARIN SODIUM 30 MG/0.3ML ~~LOC~~ SOLN
30.0000 mg | SUBCUTANEOUS | Status: DC
  Administered 2016-11-12 – 2016-11-14 (×3): 30 mg via SUBCUTANEOUS
  Filled 2016-11-12 (×3): qty 0.3

## 2016-11-12 NOTE — Progress Notes (Signed)
Order for enoxaparin 40 mg subcutaneously daily changed to 30 mg dose per anticoagulation protocol for CrCl < 30 mL/min.  Cindi CarbonMary M Deysha Cartier, PharmD, BCPS 11/12/16 10:51 AM

## 2016-11-12 NOTE — Progress Notes (Addendum)
      INFECTIOUS DISEASE ATTENDING ADDENDUM:   Date: 11/12/2016  Patient name: [name] [name]  Medical record number: [id number]  Date of birth: [birth year]   82 year old man admitted with diabetic foot infection and found to have enterococcus in one of 2 blood cultures along with a coagulase-negative staph species in the same blood culture.  His MRI of the foot is not shown evidence of osteomyelitis. He's been evaluated by vascular surgery and revascularization is being considered with angiogram scheduled for Monday.  The enterococcus is sensitive to ampicillin and I'm narrowing from vancomycin to ampicillin alone.  I would still strongly consider transesophageal echocardiogram to exclude endocarditis in this setting.  The case was discussed with Dr. Rozetta NunneryHower   Randalyn Ahmed [place] 11/12/2016, 8:06 PM

## 2016-11-12 NOTE — Consult Note (Signed)
Pharmacy Antibiotic Note  [name] [name] is a 82 y.o. female admitted on 11/09/2016 with a fall/ diabetic foot infection.  Pharmacy has been consulted for vancomycin dosing.  Plan: 11/25 0348 VT 15, SCr 1.51. Continue current dose, pharmacy will continue to follow and adjust as needed to maintain trough 15 to 20 mcg/mL.   [name] also receiving metronidazole 500mg  PO Q8h.   Will continue to monitor renal function and adjust accordingly.   Height: 5\' 2"  (157.5 cm) Weight: 164 lb 14.4 oz (74.8 kg) IBW/kg (Calculated) : 50.1  Temp (24hrs), Avg:99.9 F (37.7 C), Min:98.9 F (37.2 C), Max:101.2 F (38.4 C)   Recent Labs Lab 11/09/16 1231 11/09/16 1648 11/09/16 1900 11/10/16 0629 11/11/16 0621 11/12/16 0348  WBC 19.4*  --  18.3* 14.7*  --   --   CREATININE 1.20*  --  1.25*  --  1.32* 1.51*  LATICACIDVEN  --  1.2  --   --   --   --   VANCOTROUGH  --   --   --   --   --  15    Estimated Creatinine Clearance: 28.1 mL/min (by C-G formula based on SCr of 1.51 mg/dL (H)).    No Known Allergies  Antimicrobials this admission: zosyn 11/22 >> 11/22 vancomycin 11/22 >>  Metronidazole 11/22>> Ceftriaxone 11/22>>11/22  Dose adjustments this admission:   Microbiology results: 11/22 BCx: enterococcus species, staphylococcus species, methicillin resistance    Thank you for allowing pharmacy to be a part of this patient's care.  Carola FrostNathan A Tysin Salada, Pharm.D., BCPS Clinical Pharmacist 11/12/2016 4:50 AM

## 2016-11-12 NOTE — Consult Note (Signed)
ORTHOPAEDIC CONSULTATION  REQUESTING PHYSICIAN: Wyatt Hasteavid K Hower, MD  Chief Complaint: Left heel ulcer  HPI: [name] [name] is a 82 y.o. female who complains of  Left heel pain. Admitted recently after fall. WBC elevated and pt with + blood culture.  Concern for osteomyelitis.  Past Medical History:  Diagnosis Date  . Diabetes mellitus without complication (HCC)   . Hypertension   . Stroke [facility](HCC)    History reviewed. No pertinent surgical history. Social History   Social History  . Marital status: Married    Spouse name: N/A  . Number of children: N/A  . Years of education: N/A   Social History Main Topics  . Smoking status: Former Smoker    Types: Cigarettes  . Smokeless tobacco: Never Used  . Alcohol use No  . Drug use: No  . Sexual activity: Not Asked   Other Topics Concern  . None   Social History Narrative  . None   History reviewed. No pertinent family history. No Known Allergies Prior to Admission medications   Medication Sig Start Date End Date Taking? Authorizing Provider  aspirin EC 81 MG tablet Take 1 tablet by mouth 1 day or 1 dose.   Yes Historical Provider, MD  atorvastatin (LIPITOR) 10 MG tablet Take 1 tablet by mouth at bedtime.  03/26/16  Yes Historical Provider, MD  carvedilol (COREG) 3.125 MG tablet Take 1 tablet (3.125 mg total) by mouth 2 (two) times daily with a meal. 09/17/16  Yes Katha HammingSnehalatha Konidena, MD  diphenhydrAMINE (BENADRYL) 25 mg capsule Take 25 mg by mouth every 6 (six) hours as needed for itching.   Yes Historical Provider, MD  furosemide (LASIX) 20 MG tablet Take 1 tablet (20 mg total) by mouth daily. 09/18/16  Yes Katha HammingSnehalatha Konidena, MD  gabapentin (NEURONTIN) 100 MG capsule Take 2 capsules by mouth 2 (two) times daily. Take 2 capsules (200mg ) twice a day 02/23/16  Yes Historical Provider, MD  insulin aspart protamine- aspart (NOVOLOG MIX 70/30) (70-30) 100 UNIT/ML injection Inject 0.38 mLs (38 Units total) into the skin daily with  breakfast. Patient taking differently: Inject 42 Units into the skin daily with breakfast.  08/26/16  Yes Enedina FinnerSona Patel, MD  insulin aspart protamine- aspart (NOVOLOG MIX 70/30) (70-30) 100 UNIT/ML injection Inject 0.15 mLs (15 Units total) into the skin daily with supper. Patient taking differently: Inject 17 Units into the skin daily with supper.  09/17/16  Yes Katha HammingSnehalatha Konidena, MD  KLOR-CON M20 20 MEQ tablet Take 1 tablet by mouth daily. 02/23/16  Yes Historical Provider, MD  lisinopril (PRINIVIL,ZESTRIL) 2.5 MG tablet Take 1 tablet (2.5 mg total) by mouth daily. 09/18/16  Yes Katha HammingSnehalatha Konidena, MD  loratadine (CLARITIN) 10 MG tablet Take 10 mg by mouth daily.   Yes Historical Provider, MD  Vitamin D, Ergocalciferol, (DRISDOL) 50000 units CAPS capsule Take 1 capsule by mouth once a week. 03/26/16  Yes Historical Provider, MD   Mr Foot Left Wo Contrast  Result Date: 11/11/2016 CLINICAL DATA:  Diabetic with heel ulcer. Possible fracture of the first proximal phalanx on radiographs. Evaluate for osteomyelitis. History of peripheral neuropathy. EXAM: MRI OF THE LEFT FOOT WITHOUT CONTRAST TECHNIQUE: Multiplanar, multisequence MR imaging of the entire left foot was performed. No intravenous contrast was administered. COMPARISON:  Radiographs 11/09/2016. FINDINGS: Bones/Joint/Cartilage Bone detail limited by mild motion artifact and the large field-of-view utilized to encompass the entire foot. There is bone marrow edema and linear marrow signal within the proximal first phalanx, corresponding with the questioned fracture  on recent radiographs. No other fractures are identified. There is no bone destruction. Mild degenerative changes are present at the first metatarsal phalangeal joint. There are no other significant arthropathic changes or joint effusions. Ligaments Not relevant for exam/ indication. The Lisfranc ligament appears intact. Muscles and Tendons Mild T2 hyperintensity within the plantar foot  musculature, likely from diabetic myopathy. Possible mild flexor tenosynovitis. The ankle tendons appear normal. Soft tissues Mild skin irregularity over the calcaneal tuberosity, corresponding with reported ulceration. No focal underlying fluid collection. IMPRESSION: 1. Nondisplaced fracture of the first proximal phalanx. 2. No evidence of osteomyelitis or soft tissue abscess. 3. 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 Electronically Signed   By: Carey BullocksWilliam  Veazey M.D.   On: 11/11/2016 13:24   Dg Abd Portable 1v  Result Date: 11/11/2016 CLINICAL DATA:  Clearance for MRI.  Constipation. EXAM: PORTABLE ABDOMEN - 1 VIEW COMPARISON:  None. FINDINGS: The bowel gas pattern is normal. No radio-opaque calculi or other significant radiographic abnormality are seen. IMPRESSION: Negative. Electronically Signed   By: Ted Mcalpineobrinka  Dimitrova M.D.   On: 11/11/2016 10:36    Positive ROS: All other systems have been reviewed and were otherwise negative with the exception of those mentioned in the HPI and as above.  12 point ROS was performed.  Physical Exam: General: Alert and oriented.  No apparent distress.  Vascular:  Left foot:Dorsalis Pedis:  absent Posterior Tibial:  absent  Right foot: Dorsalis Pedis:  absent Posterior Tibial:  absent  Neuro:absent protective sensation  Derm:Left heel with 3-4cm necrotic decubitus ulcer with surrounding erythema.  No purulence.  Scnat drainage on dressing.  Right lateral malleolus with superficial small 2cm necrotic eschar.  Dry.  Ortho/MS: Demonstrated some active dorsi-plantar flexion.   Assessment: DM with PAD, Neuropathy and left heel ulcer.  Plan: Agree with santyl and cover with allevyn pad.  MRI negative for osteo or abscess. Recommend Vascular consult. No debridement at this time. Pressure reducing boots ordered.     Irean HongFowler, Siani Utke A, DPM Cell [phone]   11/12/2016 9:56 AM

## 2016-11-12 NOTE — Progress Notes (Signed)
[facility] [place] [facility]   PATIENT NAME: [name] [name]    MRN#:  [id number]  DATE OF BIRTH:  June 13, 1934  SUBJECTIVE:  Hospital Day: 3 days [name] [name] is a 82 y.o. female presenting with Fall .   Overnight events: No overnight events Interval Events: Complaints of left leg pain and weakness  REVIEW OF SYSTEMS:  CONSTITUTIONAL: No fever,Positive fatigue or weakness.  EYES: No blurred or double vision.  EARS, NOSE, AND THROAT: No tinnitus or ear pain.  RESPIRATORY: No cough, shortness of breath, wheezing or hemoptysis.  CARDIOVASCULAR: No chest pain, orthopnea, edema.  GASTROINTESTINAL: No nausea, vomiting, diarrhea or abdominal pain.  GENITOURINARY: No dysuria, hematuria.  ENDOCRINE: No polyuria, nocturia,  HEMATOLOGY: No anemia, easy bruising or bleeding SKIN: No rash or lesion. MUSCULOSKELETAL: No joint pain or arthritis.  Positive left leg pain NEUROLOGIC: No tingling, numbness, weakness.  PSYCHIATRY: No anxiety or depression.   DRUG ALLERGIES:  No Known Allergies  VITALS:  Blood pressure (!) 142/58, pulse 73, temperature 99.2 F (37.3 C), temperature source Axillary, resp. rate 20, height 5\' 2"  (1.575 m), weight 74.8 kg (164 lb 14.4 oz), SpO2 94 %.  PHYSICAL EXAMINATION:  VITAL SIGNS: Vitals:   11/12/16 0519 11/12/16 0728  BP: (!) 124/54 (!) 142/58  Pulse: 74 73  Resp:  20  Temp:  99.2 F (37.3 C)   GENERAL:82 y.o.female currently in no acute distress.  HEAD: Normocephalic, atraumatic.  EYES: Pupils equal, round, reactive to light. Extraocular muscles intact. No scleral icterus.  MOUTH: Moist mucosal membrane. Dentition intact. No abscess noted.  EAR, NOSE, THROAT: Clear without exudates. No external lesions.  NECK: Supple. No thyromegaly. No nodules. No JVD.  PULMONARY: Clear to ascultation, without wheeze rails or rhonci. No use of accessory muscles, Good respiratory effort. good air entry bilaterally CHEST:  Nontender to palpation.  CARDIOVASCULAR: S1 and S2. Regular rate and rhythm. No murmurs, rubs, or gallops. No edema. Pedal pulses 2+ bilaterally.  GASTROINTESTINAL: Soft, nontender, nondistended. No masses. Positive bowel sounds. No hepatosplenomegaly.  MUSCULOSKELETAL: No swelling, clubbing, or edema. Range of motion full in all extremities.  NEUROLOGIC: Cranial nerves II through XII are intact. No gross focal neurological deficits. Sensation intact. Reflexes intact.  SKIN: Heel ulcer with necrotic tissue surrounding erythema left leg right-sided smaller lateral malleoli skin breakdown without erythema No ulceration, lesions, rashes, or cyanosis. Skin warm and dry. Turgor intact.  PSYCHIATRIC: Mood, affect within normal limits. The patient is awake, alert and oriented x 3. Insight, judgment intact.      LABORATORY PANEL:   CBC  Recent Labs Lab 11/12/16 0810  WBC 9.3  HGB 9.0*  HCT 27.1*  PLT 221   ------------------------------------------------------------------------------------------------------------------  Chemistries   Recent Labs Lab 11/09/16 1900  11/12/16 0348  NA 134*  --   --   K 4.5  --   --   CL 104  --   --   CO2 25  --   --   GLUCOSE 60*  --   --   BUN 32*  --   --   CREATININE 1.25*  < > 1.51*  CALCIUM 8.4*  --   --   AST 24  --   --   ALT 12*  --   --   ALKPHOS 71  --   --   BILITOT 0.4  --   --   < > = values in this interval not displayed. ------------------------------------------------------------------------------------------------------------------  Cardiac Enzymes  Recent Labs  Lab 11/09/16 1231  TROPONINI 0.05*   ------------------------------------------------------------------------------------------------------------------  RADIOLOGY:  Mr Foot Left Wo Contrast  Result Date: 11/11/2016 CLINICAL DATA:  Diabetic with heel ulcer. Possible fracture of the first proximal phalanx on radiographs. Evaluate for osteomyelitis. History of  peripheral neuropathy. EXAM: MRI OF THE LEFT FOOT WITHOUT CONTRAST TECHNIQUE: Multiplanar, multisequence MR imaging of the entire left foot was performed. No intravenous contrast was administered. COMPARISON:  Radiographs 11/09/2016. FINDINGS: Bones/Joint/Cartilage Bone detail limited by mild motion artifact and the large field-of-view utilized to encompass the entire foot. There is bone marrow edema and linear marrow signal within the proximal first phalanx, corresponding with the questioned fracture on recent radiographs. No other fractures are identified. There is no bone destruction. Mild degenerative changes are present at the first metatarsal phalangeal joint. There are no other significant arthropathic changes or joint effusions. Ligaments Not relevant for exam/ indication. The Lisfranc ligament appears intact. Muscles and Tendons Mild T2 hyperintensity within the plantar foot musculature, likely from diabetic myopathy. Possible mild flexor tenosynovitis. The ankle tendons appear normal. Soft tissues Mild skin irregularity over the calcaneal tuberosity, corresponding with reported ulceration. No focal underlying fluid collection. IMPRESSION: 1. Nondisplaced fracture of the first proximal phalanx. 2. No evidence of osteomyelitis or soft tissue abscess. 3. 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 Electronically Signed   By: Carey BullocksWilliam  Veazey M.D.   On: 11/11/2016 13:24   Dg Abd Portable 1v  Result Date: 11/11/2016 CLINICAL DATA:  Clearance for MRI.  Constipation. EXAM: PORTABLE ABDOMEN - 1 VIEW COMPARISON:  None. FINDINGS: The bowel gas pattern is normal. No radio-opaque calculi or other significant radiographic abnormality are seen. IMPRESSION: Negative. Electronically Signed   By: Ted Mcalpineobrinka  Dimitrova M.D.   On: 11/11/2016 10:36    EKG:   Orders placed or performed during the hospital encounter of 11/09/16  . EKG 12-Lead  . EKG 12-Lead  . ED EKG  . ED  EKG    ASSESSMENT AND PLAN:   [name] [name] is a 82 y.o. female presenting with Fall . Admitted 11/09/2016 : Day #: 3 days  1. Bacteremia: continue current antibiotics - follow cultures, 1/4 bottles only - TTE - case discussed with ID 2. Diabetic foot ulcer: MRI negative - no osteomyelitis, vascular consult,  3. Type 2 diabetes, poorly controlled: upped insulin yesterday with better, still not optimal, control 4. Hypertension lisinopril 5. Hyperlipidemia unspecified statin therapy   All the records are reviewed and case discussed with Care Management/Social Workerr. Management plans discussed with the patient, family and they are in agreement.  CODE STATUS: full TOTAL TIME TAKING CARE OF THIS PATIENT: 28 minutes.   POSSIBLE D/C IN 2-3DAYS, DEPENDING ON CLINICAL CONDITION.   Timouthy Gilardi,  Mardi MainlandDavid K M.D on 11/12/2016 at 12:06 PM  Between 7am to 6pm - Pager - [phone]  After 6pm: House Pager: - [phone]  [facility]  Office  [phone]  CC: Primary care physician; Barbette ReichmannHANDE,VISHWANATH, MD

## 2016-11-12 NOTE — Consult Note (Signed)
Consult Note  Patient name: [name] [name] MRN: [id number] DOB: 23-May-1934 Sex: female  Consulting Physician:  Dr. Ether GriffinsFowler  Reason for Consult:  Chief Complaint  Patient presents with  . Fall    HISTORY OF PRESENT ILLNESS: 82 year old female with left heel ulcer who was admitted after a fall.  She states ulcer has been there for at least 2 weeks.  MRI was negative for Osteo.  WBC is elevated.  [name] a diabetic on insulin.  She takes a statin for hypercholesterolemia.  She is medically managed for hypertension.  She is a former smoker.  She has a history of stroke.  The patient also has a right ankle ulcer  Past Medical History:  Diagnosis Date  . Diabetes mellitus without complication (HCC)   . Hypertension   . Stroke [facility](HCC)     History reviewed. No pertinent surgical history.  Social History   Social History  . Marital status: Married    Spouse name: N/A  . Number of children: N/A  . Years of education: N/A   Occupational History  . Not on file.   Social History Main Topics  . Smoking status: Former Smoker    Types: Cigarettes  . Smokeless tobacco: Never Used  . Alcohol use No  . Drug use: No  . Sexual activity: Not on file   Other Topics Concern  . Not on file   Social History Narrative  . No narrative on file    History reviewed. No pertinent family history.  Allergies as of 11/09/2016  . (No Known Allergies)    No current facility-administered medications on file prior to encounter.    Current Outpatient Prescriptions on File Prior to Encounter  Medication Sig Dispense Refill  . aspirin EC 81 MG tablet Take 1 tablet by mouth 1 day or 1 dose.    Marland Kitchen. atorvastatin (LIPITOR) 10 MG tablet Take 1 tablet by mouth at bedtime.     . carvedilol (COREG) 3.125 MG tablet Take 1 tablet (3.125 mg total) by mouth 2 (two) times daily with a meal. 30 tablet 0  . furosemide (LASIX) 20 MG tablet Take 1 tablet (20 mg total) by mouth daily. 30 tablet 0  .  gabapentin (NEURONTIN) 100 MG capsule Take 2 capsules by mouth 2 (two) times daily. Take 2 capsules (200mg ) twice a day    . insulin aspart protamine- aspart (NOVOLOG MIX 70/30) (70-30) 100 UNIT/ML injection Inject 0.38 mLs (38 Units total) into the skin daily with breakfast. (Patient taking differently: Inject 42 Units into the skin daily with breakfast. ) 10 mL 11  . insulin aspart protamine- aspart (NOVOLOG MIX 70/30) (70-30) 100 UNIT/ML injection Inject 0.15 mLs (15 Units total) into the skin daily with supper. (Patient taking differently: Inject 17 Units into the skin daily with supper. ) 10 mL 11  . KLOR-CON M20 20 MEQ tablet Take 1 tablet by mouth daily.    Marland Kitchen. lisinopril (PRINIVIL,ZESTRIL) 2.5 MG tablet Take 1 tablet (2.5 mg total) by mouth daily. 30 tablet 0  . Vitamin D, Ergocalciferol, (DRISDOL) 50000 units CAPS capsule Take 1 capsule by mouth once a week.       REVIEW OF SYSTEMS: Cardiovascular: No chest pain, chest pressure, palpitations, orthopnea, or dyspnea on exertion. No claudication or rest pain,  No history of DVT or phlebitis. Pulmonary: No productive cough, asthma or wheezing. Neurologic: No weakness, paresthesias, aphasia, or amaurosis. No dizziness. Hematologic: No bleeding problems or  clotting disorders. Musculoskeletal: No joint pain or joint swelling. Gastrointestinal: No blood in stool or hematemesis Genitourinary: No dysuria or hematuria. Psychiatric:: No history of major depression. Integumentary: left heel ulcer Constitutional: No fever or chills.  PHYSICAL EXAMINATION: General: The patient appears their stated age.  Vital signs are BP (!) 138/52 (BP Location: Right Arm)   Pulse 81   Temp 99.8 F (37.7 C) (Oral)   Resp 20   Ht 5\' 2"  (1.575 m)   Wt 164 lb 14.4 oz (74.8 kg)   SpO2 95%   BMI 30.16 kg/m  Pulmonary: Respirations are non-labored HEENT:  No gross abnormalities Abdomen: Soft and non-tender  Musculoskeletal: There are no major deformities.     Neurologic: No focal weakness or paresthesias are detected, Skin: left heel and right ankle ulcer. Psychiatric: The patient has normal affect. Cardiovascular: There is a regular rate and rhythm without significant murmur appreciated.  Palpable femoral pulses.  Pedal pulses are not palpable  Diagnostic Studies: MRI, left foot:  1. Nondisplaced fracture of the first proximal phalanx. 2. No evidence of osteomyelitis or soft tissue abscess. 3. 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    Assessment:  Bilateral ulcers in the setting of diabetes Plan: Ulcers:  Will plan for angiography on Monday to define her anatomy and possibly intervene to improve her blood flow and assist with wound healing.  Will plan to address left leg first Acute renal failure:  Creatinine is elevated today.  Recommend gentle hydration in advance of angiogram. Risks and benefits of procedure discussed with patient She will need to be NPO after midnight on Sunday     V. Charlena CrossWells Deliliah Spranger IV, M.D. [facility] [place] Office: [phone] Pager:  [phone]

## 2016-11-12 NOTE — Progress Notes (Signed)
Paged MD to inform of pt's temp of 102.2

## 2016-11-13 ENCOUNTER — Inpatient Hospital Stay
Admit: 2016-11-13 | Discharge: 2016-11-13 | Disposition: A | Discharge status: Home / Self Care | Payer: Commercial Managed Care - HMO | Attending: Internal Medicine | Admitting: Internal Medicine

## 2016-11-13 LAB — GLUCOSE, CAPILLARY
GLUCOSE-CAPILLARY: 257 mg/dL — AB (ref 65–99)
GLUCOSE-CAPILLARY: 224 mg/dL — AB (ref 65–99)
Glucose-Capillary: 166 mg/dL — ABNORMAL HIGH (ref 65–99)
Glucose-Capillary: 222 mg/dL — ABNORMAL HIGH (ref 65–99)

## 2016-11-13 NOTE — Progress Notes (Signed)
For angio tomorrow if creatinine OK NPO after midnight  [name]

## 2016-11-13 NOTE — Evaluation (Signed)
Physical Therapy Evaluation Patient Details Name: Frederich Chickrnell Banos MRN: [id number] DOB: September 27, 1934 Today's Date: 11/13/2016   History of Present Illness  82 yo female with onset of R heel ulcer after a fall on LLE and fracture L first phalanx.  Now pt has bacteremia, noted myopathy and neuropathy impeding rehab but PMHx:  Stroke and DM  Clinical Impression  Pt is assessed with signifcant weakness and issues of non wbing on L foot until further information becomes available with the fracture and heel wound.  Pt is cooperative and motivated and will do well in SNF as is already planned.    Follow Up Recommendations SNF    Equipment Recommendations  None recommended by PT    Recommendations for Other Services       Precautions / Restrictions Precautions Precautions: Fall (telemetry and poor circulation on L foot) Restrictions Weight Bearing Restrictions: Yes LLE Weight Bearing: Non weight bearing      Mobility  Bed Mobility Overal bed mobility: Needs Assistance Bed Mobility: Supine to Sit;Sit to Supine     Supine to sit: Mod assist;Max assist Sit to supine: Mod assist;Max assist   General bed mobility comments: frequent cues for hand placement  Transfers Overall transfer level: Needs assistance Equipment used: None             General transfer comment: withheld due to general weakness and NWB on L foot  Ambulation/Gait             General Gait Details: unable  Stairs            Wheelchair Mobility    Modified Rankin (Stroke Patients Only)       Balance Overall balance assessment: Needs assistance;History of Falls Sitting-balance support: Feet supported Sitting balance-Leahy Scale: Fair                                       Pertinent Vitals/Pain Pain Assessment: Faces Faces Pain Scale: Hurts little more Pain Location: L foot Pain Intervention(s): Monitored during session;Premedicated before session;Repositioned    Home  Living Family/patient expects to be discharged to:: Private residence Living Arrangements: Children Available Help at Discharge: Family Type of Home: House Home Access: Stairs to enter Entrance Stairs-Rails: Right Entrance Stairs-Number of Steps: 6 Home Layout: One level Home Equipment: Wheelchair - [employer] Corporationmanual;Walker - 2 wheels      Prior Function Level of Independence: Independent with assistive device(s)         Comments: difficulty with family making time to come help     Hand Dominance   Dominant Hand: Right    Extremity/Trunk Assessment   Upper Extremity Assessment: Generalized weakness           Lower Extremity Assessment: Generalized weakness      Cervical / Trunk Assessment: Kyphotic  Communication   Communication: No difficulties  Cognition Arousal/Alertness: Awake/alert Behavior During Therapy: WFL for tasks assessed/performed Overall Cognitive Status: Within Functional Limits for tasks assessed                      General Comments General comments (skin integrity, edema, etc.): Pt unable to stand with PT and noted that she needed continual assist to maintain control of trunk side of bed    Exercises     Assessment/Plan    PT Assessment Patient needs continued PT services  PT Problem List Decreased strength;Decreased range of motion;Decreased activity tolerance;Decreased  mobility;Decreased balance;Decreased coordination;Decreased cognition;Decreased knowledge of use of DME;Decreased safety awareness;Decreased knowledge of precautions;Cardiopulmonary status limiting activity;Obesity;Decreased skin integrity;Pain          PT Treatment Interventions DME instruction;Gait training;Functional mobility training;Therapeutic activities;Therapeutic exercise;Cognitive remediation;Neuromuscular re-education;Balance training    PT Goals (Current goals can be found in the Care Plan section)  Acute Rehab PT Goals Patient Stated Goal: to get stronger PT  Goal Formulation: With patient/family Time For Goal Achievement: 11/27/16 Potential to Achieve Goals: Good    Frequency Min 2X/week   Barriers to discharge Inaccessible home environment;Decreased caregiver support      Co-evaluation               End of Session Equipment Utilized During Treatment: Gait belt Activity Tolerance: Patient limited by fatigue Patient left: in bed;with call bell/phone within reach;with bed alarm set;with family/visitor present Nurse Communication: Mobility status         Time: [id number] PT Time Calculation (min) (ACUTE ONLY): 28 min   Charges:   PT Evaluation $PT Eval Moderate Complexity: 1 Procedure PT Treatments $Therapeutic Activity: 8-22 mins   PT G Codes:        Ivar DrapeStout, Milas Schappell E 11/13/2016, 4:19 PM    Samul Dadauth Yeiren Whitecotton, PT MS Acute Rehab Dept. Number: [facility] [phone] and [facility] [phone]

## 2016-11-13 NOTE — Progress Notes (Signed)
[facility] [place] [facility]   PATIENT NAME: [name] [name]    MRN#:  [id number]  DATE OF BIRTH:  [birth year]  SUBJECTIVE:  Hospital Day: 4 days [name] [name] is a 82 y.o. female presenting with Fall .   Overnight events: No overnight events Interval Events: no complaints  REVIEW OF SYSTEMS:  CONSTITUTIONAL: No fever,Positive fatigue or weakness.  EYES: No blurred or double vision.  EARS, NOSE, AND THROAT: No tinnitus or ear pain.  RESPIRATORY: No cough, shortness of breath, wheezing or hemoptysis.  CARDIOVASCULAR: No chest pain, orthopnea, edema.  GASTROINTESTINAL: No nausea, vomiting, diarrhea or abdominal pain.  GENITOURINARY: No dysuria, hematuria.  ENDOCRINE: No polyuria, nocturia,  HEMATOLOGY: No anemia, easy bruising or bleeding SKIN: No rash or lesion. MUSCULOSKELETAL: No joint pain or arthritis.  Positive left leg pain NEUROLOGIC: No tingling, numbness, weakness.  PSYCHIATRY: No anxiety or depression.   DRUG ALLERGIES:  No Known Allergies  VITALS:  Blood pressure (!) 128/54, pulse 67, temperature 98.5 F (36.9 C), temperature source Oral, resp. rate 18, height 5\' 2"  (1.575 m), weight 74.8 kg (164 lb 14.4 oz), SpO2 97 %.  PHYSICAL EXAMINATION:  VITAL SIGNS: Vitals:   11/13/16 0455 11/13/16 0735  BP: (!) 125/50 (!) 128/54  Pulse: 69 67  Resp: 16 18  Temp: 98.6 F (37 C) 98.5 F (36.9 C)   GENERAL:82 y.o.female currently in no acute distress.  HEAD: Normocephalic, atraumatic.  EYES: Pupils equal, round, reactive to light. Extraocular muscles intact. No scleral icterus.  MOUTH: Moist mucosal membrane. Dentition intact. No abscess noted.  EAR, NOSE, THROAT: Clear without exudates. No external lesions.  NECK: Supple. No thyromegaly. No nodules. No JVD.  PULMONARY: Clear to ascultation, without wheeze rails or rhonci. No use of accessory muscles, Good respiratory effort. good air entry bilaterally CHEST: Nontender to  palpation.  CARDIOVASCULAR: S1 and S2. Regular rate and rhythm. No murmurs, rubs, or gallops. No edema. Pedal pulses 2+ bilaterally.  GASTROINTESTINAL: Soft, nontender, nondistended. No masses. Positive bowel sounds. No hepatosplenomegaly.  MUSCULOSKELETAL: No swelling, clubbing, or edema. Range of motion full in all extremities.  NEUROLOGIC: Cranial nerves II through XII are intact. No gross focal neurological deficits. Sensation intact. Reflexes intact.  SKIN: Heel ulcer with necrotic tissue surrounding erythema left leg right-sided smaller lateral malleoli skin breakdown without erythema No ulceration, lesions, rashes, or cyanosis. Skin warm and dry. Turgor intact.  PSYCHIATRIC: Mood, affect within normal limits. The patient is awake, alert and oriented x 3. Insight, judgment intact.      LABORATORY PANEL:   CBC  Recent Labs Lab 11/12/16 0810  WBC 9.3  HGB 9.0*  HCT 27.1*  PLT 221   ------------------------------------------------------------------------------------------------------------------  Chemistries   Recent Labs Lab 11/09/16 1900  11/12/16 0348  NA 134*  --   --   K 4.5  --   --   CL 104  --   --   CO2 25  --   --   GLUCOSE 60*  --   --   BUN 32*  --   --   CREATININE 1.25*  < > 1.51*  CALCIUM 8.4*  --   --   AST 24  --   --   ALT 12*  --   --   ALKPHOS 71  --   --   BILITOT 0.4  --   --   < > = values in this interval not displayed. ------------------------------------------------------------------------------------------------------------------  Cardiac Enzymes  Recent Labs Lab 11/09/16  1231  TROPONINI 0.05*   ------------------------------------------------------------------------------------------------------------------  RADIOLOGY:  Mr Foot Left Wo Contrast  Result Date: 11/11/2016 CLINICAL DATA:  Diabetic with heel ulcer. Possible fracture of the first proximal phalanx on radiographs. Evaluate for osteomyelitis. History of peripheral  neuropathy. EXAM: MRI OF THE LEFT FOOT WITHOUT CONTRAST TECHNIQUE: Multiplanar, multisequence MR imaging of the entire left foot was performed. No intravenous contrast was administered. COMPARISON:  Radiographs 11/09/2016. FINDINGS: Bones/Joint/Cartilage Bone detail limited by mild motion artifact and the large field-of-view utilized to encompass the entire foot. There is bone marrow edema and linear marrow signal within the proximal first phalanx, corresponding with the questioned fracture on recent radiographs. No other fractures are identified. There is no bone destruction. Mild degenerative changes are present at the first metatarsal phalangeal joint. There are no other significant arthropathic changes or joint effusions. Ligaments Not relevant for exam/ indication. The Lisfranc ligament appears intact. Muscles and Tendons Mild T2 hyperintensity within the plantar foot musculature, likely from diabetic myopathy. Possible mild flexor tenosynovitis. The ankle tendons appear normal. Soft tissues Mild skin irregularity over the calcaneal tuberosity, corresponding with reported ulceration. No focal underlying fluid collection. IMPRESSION: 1. Nondisplaced fracture of the first proximal phalanx. 2. No evidence of osteomyelitis or soft tissue abscess. 3. 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 Electronically Signed   By: Carey BullocksWilliam  Veazey M.D.   On: 11/11/2016 13:24    EKG:   Orders placed or performed during the hospital encounter of 11/09/16  . EKG 12-Lead  . EKG 12-Lead  . ED EKG  . ED EKG    ASSESSMENT AND PLAN:   [name] [name] is a 82 y.o. female presenting with Fall . Admitted 11/09/2016 : Day #: 4 days  1. Bacteremia: continue current antibiotics - follow cultures, 1/4 bottles only - case discussed with ID--  2. Diabetic foot ulcer: MRI negative - no osteomyelitis, vascular consult - for angio tomorrow 3. Type 2 diabetes, poorly controlled: upped  insulin yesterday with better, still not optimal, control 4. Hypertension lisinopril 5. Hyperlipidemia unspecified statin therapy   All the records are reviewed and case discussed with Care Management/Social Workerr. Management plans discussed with the patient, family and they are in agreement.  CODE STATUS: full TOTAL TIME TAKING CARE OF THIS PATIENT: 28 minutes.   POSSIBLE D/C IN 2-3DAYS, DEPENDING ON CLINICAL CONDITION.   Mykell Rawl,  Mardi MainlandDavid K M.D on 11/13/2016 at 12:39 PM  Between 7am to 6pm - Pager - [phone]  After 6pm: House Pager: - [phone]  [facility]  Office  [phone]  CC: Primary care physician; Barbette ReichmannHANDE,VISHWANATH, MD

## 2016-11-14 ENCOUNTER — Encounter
Admission: EM | Disposition: A | Discharge status: Skilled Nursing Facility | Payer: Self-pay | Source: Home / Self Care | Attending: Internal Medicine

## 2016-11-14 ENCOUNTER — Inpatient Hospital Stay: Payer: Commercial Managed Care - HMO

## 2016-11-14 DIAGNOSIS — I70248 Atherosclerosis of native arteries of left leg with ulceration of other part of lower left leg: Secondary | ICD-10-CM

## 2016-11-14 HISTORY — PX: PERIPHERAL VASCULAR CATHETERIZATION: SHX172C

## 2016-11-14 LAB — BASIC METABOLIC PANEL
Anion gap: 5 (ref 5–15)
BUN: 38 mg/dL — ABNORMAL HIGH (ref 6–20)
CALCIUM: 8.2 mg/dL — AB (ref 8.9–10.3)
CO2: 24 mmol/L (ref 22–32)
CREATININE: 1.34 mg/dL — AB (ref 0.44–1.00)
Chloride: 107 mmol/L (ref 101–111)
GFR calc Af Amer: 42 mL/min — ABNORMAL LOW (ref >60)
GFR calc non Af Amer: 36 mL/min — ABNORMAL LOW (ref >60)
GLUCOSE: 146 mg/dL — AB (ref 65–99)
Potassium: 5.2 mmol/L — ABNORMAL HIGH (ref 3.5–5.1)
Sodium: 136 mmol/L (ref 135–145)

## 2016-11-14 LAB — CULTURE, BLOOD (ROUTINE X 2): Culture: NO GROWTH

## 2016-11-14 LAB — ECHOCARDIOGRAM COMPLETE
EERAT: 24.96
EWDT: 204 ms
FS: 30 % (ref 28–44)
Height: 62 in
IVS/LV PW RATIO, ED: 1.13
LA ID, A-P, ES: 35 mm
LA diam end sys: 35 mm
LA diam index: 1.91 cm/m2
LA vol index: 26.7 mL/m2
LA vol: 49.1 mL
LAVOLA4C: 55.7 mL
LV E/e' medial: 24.96
LV E/e'average: 24.96
LV TDI E'LATERAL: 6.09
LV e' LATERAL: 6.09 cm/s
MV Dec: 204
MV pk A vel: 124 m/s
MVPG: 9 mmHg
MVPKEVEL: 152 m/s
PW: 9.95 mm — AB (ref 0.6–1.1)
RV LATERAL S' VELOCITY: 12.9 cm/s
TAPSE: 23.2 mm
TDI e' medial: 4.03
Weight: 2638.4 oz

## 2016-11-14 LAB — GLUCOSE, CAPILLARY
GLUCOSE-CAPILLARY: 152 mg/dL — AB (ref 65–99)
Glucose-Capillary: 138 mg/dL — ABNORMAL HIGH (ref 65–99)
Glucose-Capillary: 211 mg/dL — ABNORMAL HIGH (ref 65–99)

## 2016-11-14 SURGERY — LOWER EXTREMITY ANGIOGRAPHY
Anesthesia: Moderate Sedation

## 2016-11-14 MED ORDER — HEPARIN SODIUM (PORCINE) 1000 UNIT/ML IJ SOLN
INTRAMUSCULAR | Status: DC | PRN
  Administered 2016-11-14: 4000 [IU] via INTRAVENOUS

## 2016-11-14 MED ORDER — MIDAZOLAM HCL 5 MG/5ML IJ SOLN
INTRAMUSCULAR | Status: AC
  Filled 2016-11-14: qty 5

## 2016-11-14 MED ORDER — MIDAZOLAM HCL 2 MG/2ML IJ SOLN
INTRAMUSCULAR | Status: DC | PRN
  Administered 2016-11-14: 2 mg via INTRAVENOUS

## 2016-11-14 MED ORDER — LIDOCAINE HCL (PF) 1 % IJ SOLN
INTRAMUSCULAR | Status: AC
  Filled 2016-11-14: qty 10

## 2016-11-14 MED ORDER — HEPARIN SODIUM (PORCINE) 1000 UNIT/ML IJ SOLN
INTRAMUSCULAR | Status: AC
  Filled 2016-11-14: qty 1

## 2016-11-14 MED ORDER — FENTANYL CITRATE (PF) 100 MCG/2ML IJ SOLN
INTRAMUSCULAR | Status: DC | PRN
  Administered 2016-11-14: 50 ug via INTRAVENOUS

## 2016-11-14 MED ORDER — HEPARIN (PORCINE) IN NACL 2-0.9 UNIT/ML-% IJ SOLN
INTRAMUSCULAR | Status: AC
  Filled 2016-11-14: qty 1000

## 2016-11-14 MED ORDER — FENTANYL CITRATE (PF) 100 MCG/2ML IJ SOLN
INTRAMUSCULAR | Status: AC
  Filled 2016-11-14: qty 2

## 2016-11-14 MED ORDER — CLOPIDOGREL BISULFATE 75 MG PO TABS
75.0000 mg | ORAL_TABLET | Freq: Every day | ORAL | Status: DC
  Administered 2016-11-14 – 2016-11-15 (×2): 75 mg via ORAL
  Filled 2016-11-14 (×2): qty 1

## 2016-11-14 SURGICAL SUPPLY — 12 items
BALLN LUTONIX DCB 5X80X130 (BALLOONS) ×3
BALLOON LUTONIX DCB 5X80X130 (BALLOONS) IMPLANT
CATH PIG 70CM (CATHETERS) ×3 IMPLANT
CATH VERT 100CM (CATHETERS) ×2 IMPLANT
DEVICE PRESTO INFLATION (MISCELLANEOUS) ×2 IMPLANT
GLIDEWIRE ADV .035X260CM (WIRE) ×2 IMPLANT
PACK ANGIOGRAPHY (CUSTOM PROCEDURE TRAY) ×3 IMPLANT
SHEATH BRITE TIP 5FRX11 (SHEATH) ×3 IMPLANT
SYR MEDRAD MARK V 150ML (SYRINGE) ×3 IMPLANT
TOWEL OR 17X26 4PK STRL BLUE (TOWEL DISPOSABLE) ×2 IMPLANT
TUBING CONTRAST HIGH PRESS 72 (TUBING) ×3 IMPLANT
WIRE J 3MM .035X145CM (WIRE) ×3 IMPLANT

## 2016-11-14 NOTE — Progress Notes (Signed)
[facility] Infectious Disease     Reason for Consult: Foot infection, bacteremia    Referring Physician:  Serita Grit Date of Admission:  11/09/2016   Active Problems:   Type 1 diabetes mellitus with diabetic heel ulcer (HCC)   HPI: [name] [name] is a 82 y.o. female with DM, PVD admitted with a fall and found to have a L heel ulcer with eschar and bacteremia with enterococcus.  MRI neg for osteo.  Seen by podiatry and vascular. For revascularization today. She is a poor historian and cannot tell me how long the wounds have been present.  She cannot recall falling either.   Past Medical History:  Diagnosis Date  . Diabetes mellitus without complication ([place])   . Hypertension   . Stroke [facility])    History reviewed. No pertinent surgical history. Social History  Substance Use Topics  . Smoking status: Former Smoker    Types: Cigarettes  . Smokeless tobacco: Never Used  . Alcohol use No   History reviewed. No pertinent family history.  Allergies: No Known Allergies  Current antibiotics: Antibiotics Given (last 72 hours)    Date/Time Action Medication Dose Rate   11/12/16 0356 Given   vancomycin (VANCOCIN) IVPB 1000 mg/200 mL premix 1,000 mg 200 mL/hr   11/12/16 1753 Given   ampicillin (OMNIPEN) 2 g in sodium chloride 0.9 % 50 mL IVPB 2 g 150 mL/hr   11/13/16 0000 Given   ampicillin (OMNIPEN) 2 g in sodium chloride 0.9 % 50 mL IVPB 2 g 150 mL/hr   11/13/16 0600 Given   ampicillin (OMNIPEN) 2 g in sodium chloride 0.9 % 50 mL IVPB 2 g 150 mL/hr   11/13/16 1250 Given   ampicillin (OMNIPEN) 2 g in sodium chloride 0.9 % 50 mL IVPB 2 g 150 mL/hr   11/13/16 1744 Given   ampicillin (OMNIPEN) 2 g in sodium chloride 0.9 % 50 mL IVPB 2 g 150 mL/hr   11/13/16 2322 Given   ampicillin (OMNIPEN) 2 g in sodium chloride 0.9 % 50 mL IVPB 2 g 150 mL/hr   11/14/16 0549 Given   ampicillin (OMNIPEN) 2 g in sodium chloride 0.9 % 50 mL IVPB 2 g 150 mL/hr   11/14/16 1240 Given   ampicillin  (OMNIPEN) 2 g in sodium chloride 0.9 % 50 mL IVPB 2 g 150 mL/hr      MEDICATIONS: . ampicillin (OMNIPEN) IV  2 g Intravenous Q6H  . aspirin EC  81 mg Oral 1 day or 1 dose  . atorvastatin  10 mg Oral QHS  . carvedilol  3.125 mg Oral BID WC  . collagenase   Topical Daily  . enoxaparin (LOVENOX) injection  30 mg Subcutaneous Q24H  . feeding supplement (GLUCERNA SHAKE)  237 mL Oral TID BM  . furosemide  20 mg Oral Daily  . gabapentin  200 mg Oral BID  . insulin aspart  0-15 Units Subcutaneous TID WC  . insulin aspart  0-5 Units Subcutaneous QHS  . insulin aspart protamine- aspart  19 Units Subcutaneous Q supper  . insulin aspart protamine- aspart  42 Units Subcutaneous Q breakfast  . lisinopril  2.5 mg Oral Daily  . loratadine  10 mg Oral Daily  . Vitamin D (Ergocalciferol)  50,000 Units Oral Weekly    Review of Systems - 11 systems reviewed and negative per HPI   OBJECTIVE: Temp:  [98.4 F (36.9 C)-99.6 F (37.6 C)] 99.2 F (37.3 C) (11/27 0753) Pulse Rate:  [73-80] 75 (11/27 0753)  Resp:  [17-19] 17 (11/27 0753) BP: (111-134)/(34-56) 111/40 (11/27 0753) SpO2:  [95 %-100 %] 100 % (11/27 0753) Physical Exam  Constitutional: frail, disoriented HENT: Derma/AT, PERRLA, no scleral icterus Mouth/Throat: Oropharynx is clear and moist. No oropharyngeal exudate.  Cardiovascular: Normal rate, regular rhythm and normal heart sounds. Pulmonary/Chest: Effort normal and breath sounds normal. No respiratory distress.  has no wheezes.  Neck = supple, no nuchal rigidity Abdominal: Soft. Bowel sounds are normal.  exhibits no distension. There is no tenderness.  Lymphadenopathy: no cervical adenopathy. No axillary adenopathy Ext no cce  Neurological: disoriented  Skin: L heel with ulceration, dark eschar, surrounding erythema R lateral malleous with a heaped up wound with some drainage.  Psychiatric: a normal mood and affect.  behavior is normal.    LABS: Results for orders placed or  performed during the hospital encounter of 11/09/16 (from the past 48 hour(s))  Glucose, capillary     Status: Abnormal   Collection Time: 11/12/16  4:35 PM  Result Value Ref Range   Glucose-Capillary 203 (H) 65 - 99 mg/dL   Comment 1 Notify RN   Glucose, capillary     Status: Abnormal   Collection Time: 11/12/16  9:25 PM  Result Value Ref Range   Glucose-Capillary 185 (H) 65 - 99 mg/dL  Glucose, capillary     Status: Abnormal   Collection Time: 11/13/16  7:35 AM  Result Value Ref Range   Glucose-Capillary 166 (H) 65 - 99 mg/dL   Comment 1 Notify RN   Glucose, capillary     Status: Abnormal   Collection Time: 11/13/16 11:29 AM  Result Value Ref Range   Glucose-Capillary 257 (H) 65 - 99 mg/dL  Glucose, capillary     Status: Abnormal   Collection Time: 11/13/16  4:49 PM  Result Value Ref Range   Glucose-Capillary 222 (H) 65 - 99 mg/dL   Comment 1 Notify RN   Glucose, capillary     Status: Abnormal   Collection Time: 11/13/16  9:15 PM  Result Value Ref Range   Glucose-Capillary 224 (H) 65 - 99 mg/dL  Basic metabolic panel     Status: Abnormal   Collection Time: 11/14/16  5:32 AM  Result Value Ref Range   Sodium 136 135 - 145 mmol/L   Potassium 5.2 (H) 3.5 - 5.1 mmol/L   Chloride 107 101 - 111 mmol/L   CO2 24 22 - 32 mmol/L   Glucose, Bld 146 (H) 65 - 99 mg/dL   BUN 38 (H) 6 - 20 mg/dL   Creatinine, Ser 1.34 (H) 0.44 - 1.00 mg/dL   Calcium 8.2 (L) 8.9 - 10.3 mg/dL   GFR calc non Af Amer 36 (L) >60 mL/min   GFR calc Af Amer 42 (L) >60 mL/min    Comment: (NOTE) The eGFR has been calculated using the CKD EPI equation. This calculation has not been validated in all clinical situations. eGFR's persistently <60 mL/min signify possible Chronic Kidney Disease.    Anion gap 5 5 - 15  Glucose, capillary     Status: Abnormal   Collection Time: 11/14/16  7:52 AM  Result Value Ref Range   Glucose-Capillary 152 (H) 65 - 99 mg/dL  Glucose, capillary     Status: Abnormal   Collection  Time: 11/14/16 11:54 AM  Result Value Ref Range   Glucose-Capillary 138 (H) 65 - 99 mg/dL   No components found for: ESR, C REACTIVE PROTEIN MICRO: Recent Results (from the past 720 hour(s))  Blood  culture (routine x 2)     Status: None   Collection Time: 11/09/16  4:06 PM  Result Value Ref Range Status   Specimen Description BLOOD LEFT AC  Final   Special Requests   Final    BOTTLES DRAWN AEROBIC AND ANAEROBIC  AER 11CC ANA 10   Culture NO GROWTH 5 DAYS  Final   Report Status 11/14/2016 FINAL  Final  Blood culture (routine x 2)     Status: Abnormal   Collection Time: 11/09/16  4:17 PM  Result Value Ref Range Status   Specimen Description BLOOD  RIGHT WRIST  Final   Special Requests   Final    BOTTLES DRAWN AEROBIC AND ANAEROBIC  AER 12CC ANA10CC   Culture  Setup Time   Final    GRAM POSITIVE COCCI AEROBIC BOTTLE ONLY CRITICAL RESULT CALLED TO, READ BACK BY AND VERIFIED WITH: SCOTT [place] ON 11/10/16 AT 1327 QSD    Culture (A)  Final    ENTEROCOCCUS FAECALIS STAPHYLOCOCCUS SPECIES (COAGULASE NEGATIVE) THE SIGNIFICANCE OF ISOLATING THIS ORGANISM FROM A SINGLE SET OF BLOOD CULTURES WHEN MULTIPLE SETS ARE DRAWN IS UNCERTAIN. PLEASE NOTIFY THE MICROBIOLOGY DEPARTMENT WITHIN ONE WEEK IF SPECIATION AND SENSITIVITIES ARE REQUIRED. Performed at [facility]    Report Status 11/12/2016 FINAL  Final   Organism ID, Bacteria ENTEROCOCCUS FAECALIS  Final      Susceptibility   Enterococcus faecalis - MIC*    AMPICILLIN <=2 SENSITIVE Sensitive     VANCOMYCIN 1 SENSITIVE Sensitive     GENTAMICIN SYNERGY RESISTANT Resistant     * ENTEROCOCCUS FAECALIS  Blood Culture ID Panel (Reflexed)     Status: Abnormal   Collection Time: 11/09/16  4:17 PM  Result Value Ref Range Status   Enterococcus species DETECTED (A) NOT DETECTED Final    Comment: CRITICAL RESULT CALLED TO, READ BACK BY AND VERIFIED WITH: SCOTT CHRISTY ON 11/10/16 AT 1327 QSD    Vancomycin resistance NOT DETECTED NOT  DETECTED Final   Listeria monocytogenes NOT DETECTED NOT DETECTED Final   Staphylococcus species DETECTED (A) NOT DETECTED Final    Comment: CRITICAL RESULT CALLED TO, READ BACK BY AND VERIFIED WITH: SCOTT CHRISTY ON 11/10/16 AT 1327 QSD    Staphylococcus aureus NOT DETECTED NOT DETECTED Final   Methicillin resistance DETECTED (A) NOT DETECTED Final    Comment: CRITICAL RESULT CALLED TO, READ BACK BY AND VERIFIED WITH: SCOTT CHRISTY ON 11/10/16 AT 1327 QSD    Streptococcus species NOT DETECTED NOT DETECTED Final   Streptococcus agalactiae NOT DETECTED NOT DETECTED Final   Streptococcus pneumoniae NOT DETECTED NOT DETECTED Final   Streptococcus pyogenes NOT DETECTED NOT DETECTED Final   Acinetobacter baumannii NOT DETECTED NOT DETECTED Final   Enterobacteriaceae species NOT DETECTED NOT DETECTED Final   Enterobacter cloacae complex NOT DETECTED NOT DETECTED Final   Escherichia coli NOT DETECTED NOT DETECTED Final   Klebsiella oxytoca NOT DETECTED NOT DETECTED Final   Klebsiella pneumoniae NOT DETECTED NOT DETECTED Final   Proteus species NOT DETECTED NOT DETECTED Final   Serratia marcescens NOT DETECTED NOT DETECTED Final   Haemophilus influenzae NOT DETECTED NOT DETECTED Final   Neisseria meningitidis NOT DETECTED NOT DETECTED Final   Pseudomonas aeruginosa NOT DETECTED NOT DETECTED Final   Candida albicans NOT DETECTED NOT DETECTED Final   Candida glabrata NOT DETECTED NOT DETECTED Final   Candida krusei NOT DETECTED NOT DETECTED Final   Candida parapsilosis NOT DETECTED NOT DETECTED Final   Candida tropicalis NOT DETECTED  NOT DETECTED Final    IMAGING: Ct Head Wo Contrast  Result Date: 11/09/2016 CLINICAL DATA:  Altered mental status with disorientation. Difficulty ambulating. Unwitnessed fall. EXAM: CT HEAD WITHOUT CONTRAST TECHNIQUE: Contiguous axial images were obtained from the base of the skull through the vertex without intravenous contrast. COMPARISON:  April 02, 2016  FINDINGS: Brain: Moderate diffuse atrophy is stable. There is no intracranial mass, hemorrhage, extra-axial fluid collection, or midline shift. There is evidence of prior infarct in the medial left occipital lobe. The patient has had a prior infarct in the medial posterior right lentiform nucleus. There is evidence of prior lacunar type infarcts in each pons region, slightly larger on the left than on the right. There is small vessel disease throughout the centra semiovale bilaterally. No acute infarct is appreciable on this study. Vascular: There is no hyperdense vessel. There is calcification in each carotid siphon region. Skull: The bony calvarium appears intact. Sinuses/Orbits: There is mild mucosal thickening in several ethmoid air cells bilaterally. Visualized paranasal sinuses elsewhere clear. Visualized orbits appear symmetric bilaterally. Other: Visualized mastoid air cells are clear. IMPRESSION: Stable atrophy with scattered prior small infarcts as summarized above. There is periventricular small vessel disease in the centra semiovale bilaterally. No acute infarct evident. No mass, hemorrhage, or extra-axial fluid collection. There are foci of calcification in each carotid siphon region. There is mild ethmoid sinus disease bilaterally. Electronically Signed   By: Lowella Grip III M.D.   On: 11/09/2016 13:01   Mr Foot Left Wo Contrast  Result Date: 11/11/2016 CLINICAL DATA:  Diabetic with heel ulcer. Possible fracture of the first proximal phalanx on radiographs. Evaluate for osteomyelitis. History of peripheral neuropathy. EXAM: MRI OF THE LEFT FOOT WITHOUT CONTRAST TECHNIQUE: Multiplanar, multisequence MR imaging of the entire left foot was performed. No intravenous contrast was administered. COMPARISON:  Radiographs 11/09/2016. FINDINGS: Bones/Joint/Cartilage Bone detail limited by mild motion artifact and the large field-of-view utilized to encompass the entire foot. There is bone marrow edema  and linear marrow signal within the proximal first phalanx, corresponding with the questioned fracture on recent radiographs. No other fractures are identified. There is no bone destruction. Mild degenerative changes are present at the first metatarsal phalangeal joint. There are no other significant arthropathic changes or joint effusions. Ligaments Not relevant for exam/ indication. The Lisfranc ligament appears intact. Muscles and Tendons Mild T2 hyperintensity within the plantar foot musculature, likely from diabetic myopathy. Possible mild flexor tenosynovitis. The ankle tendons appear normal. Soft tissues Mild skin irregularity over the calcaneal tuberosity, corresponding with reported ulceration. No focal underlying fluid collection. IMPRESSION: 1. Nondisplaced fracture of the first proximal phalanx. 2. No evidence of osteomyelitis or soft tissue abscess. 3. 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 Electronically Signed   By: Richardean Sale M.D.   On: 11/11/2016 13:24   Dg Chest Port 1 View  Result Date: 11/14/2016 CLINICAL DATA:  PICC line placement EXAM: PORTABLE CHEST 1 VIEW COMPARISON:  09/15/2016 FINDINGS: Cardiomediastinal silhouette is unremarkable. No infiltrate or pulmonary edema. There is right arm PICC line with tip in SVC right atrium junction. No pneumothorax. IMPRESSION: Right arm PICC line in place.  No pneumothorax. Electronically Signed   By: Lahoma Crocker M.D.   On: 11/14/2016 14:19   Dg Abd Portable 1v  Result Date: 11/11/2016 CLINICAL DATA:  Clearance for MRI.  Constipation. EXAM: PORTABLE ABDOMEN - 1 VIEW COMPARISON:  None. FINDINGS: The bowel gas pattern is normal. No radio-opaque calculi or other significant  radiographic abnormality are seen. IMPRESSION: Negative. Electronically Signed   By: Fidela Salisbury M.D.   On: 11/11/2016 10:36   Dg Foot Complete Left  Result Date: 11/09/2016 CLINICAL DATA:  Fall, heel pain and  ulcer. EXAM: LEFT FOOT - COMPLETE 3+ VIEW COMPARISON:  None available FINDINGS: Severe osteopenia noted throughout the left foot. Peripheral atherosclerosis present. Degenerative changes noted of the MTP joints and the interphalangeal joints compatible with osteoarthritis. Normal alignment. Linear lucency through the left great toe proximal phalanx, suspicious for a nondisplaced fracture. Recommend correlation for point tenderness in this region. Small plantar calcaneal spur.  Calcaneus appears intact. IMPRESSION: Left great toe proximal phalanx linear lucency concerning for subtle nondisplaced fracture. Recommend correlation for point tenderness in this region. Severe osteopenia and diffuse osteoarthritis Peripheral atherosclerosis Plantar calcaneal spur. Electronically Signed   By: Jerilynn Mages.  Shick M.D.   On: 11/09/2016 16:54    Echo 2017  - Normal left ventricular systolic function with mild diastolic   dysfunction and septal hypokinesis suggestive of coronary artery   disease. There is no vegetation seen on any of the valves.  Assessment:   [name] [name] is a 82 y.o. female with PVD with  diabetic heel ulcer and R ankle wound as well as enterococcal bacteremia. For revascularization today.  Initially had wbc 14 and fever to 102 but improving. Currently on ampicillin.  Recommendations Continue wu with vascular and podiatry. May need debridement and cultures of deeper tissues.  Conitnue ampicillin for now. If worsens would broaden to unasyn   Thank you very much for allowing me to participate in the care of this patient. Please call with questions.   Cheral Marker. Ola Spurr, MD

## 2016-11-14 NOTE — Progress Notes (Signed)
Pt returned to the unit at this time, pt does not remember the procedure, this writer received report that pt had had an angioplasty to her L leg. Per procedure nurse, doppler has been used to verify pedal pulses, pt has a small dressing to her r groin that is dry and intact. Pt denied pain, understands she must lie flat until after 1900. Pt has bilat blue boots in place with dressings to bilat feet dry and intact. Picc line is intact as well. Call bell in reach.

## 2016-11-14 NOTE — Progress Notes (Signed)
Pt left the unit at this time via her bed for special procedures. Pt has already had PICC line placed, pt gave consent for this earlier. This writer explained procedure and need for two weeks of antibiotics per doctor. Pt agreeable to this. Further, this writer held pt's po potassium this morning due to potassium level of 5.2, Dr. Allena KatzPatel was made aware.

## 2016-11-14 NOTE — NC FL2 (Signed)
[place] MEDICAID FL2 LEVEL OF CARE SCREENING TOOL     IDENTIFICATION  Patient Name: [name] [name] Birthdate: Jul 12, 1934 Sex: female Admission Date (Current Location): 11/09/2016  [place]   Facility and Address:  [facility], [address], [place], [place] [address]      Provider Number: [id number]  Attending Physician Name and Address:  Enedina FinnerSona Patel, MD  Relative Name and Phone Number:       Current Level of Care: Hospital Recommended Level of Care: Skilled Nursing Facility Prior Approval Number:    Date Approved/Denied:   PASRR Number: [id number]  Discharge Plan: SNF    Current Diagnoses: Patient Active Problem List   Diagnosis Date Noted  . 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 Acute respiratory failure with hypoxia (HCC) 09/15/2016  . Carpal tunnel syndrome 09/01/2016  . Hypertension 09/01/2016  . PVD (peripheral vascular disease) (HCC) 09/01/2016  . Open abdominal wall wound 09/01/2016  . Abscess 08/21/2016  . Type 2 diabetes mellitus with complication (HCC) 05/20/2015  . Visual disturbance 05/20/2015  . H/O: CVA (cerebrovascular accident) 01/21/2015  . Difficulty in walking 08/27/2014  . Diabetes (HCC) 08/20/2014  . Diabetic neuropathy (HCC) 08/20/2014  . Dizzy spells 08/20/2014  . Hand numbness 08/20/2014  . Obesity 08/20/2014    Orientation RESPIRATION BLADDER Height & Weight     Self, Place  Normal Incontinent Weight: 164 lb 14.4 oz (74.8 kg) Height:  5\' 2"  (157.5 cm)  BEHAVIORAL SYMPTOMS/MOOD NEUROLOGICAL BOWEL NUTRITION STATUS   (none)  (none) Incontinent Diet (npo currently; to be advanced)  AMBULATORY STATUS COMMUNICATION OF NEEDS Skin   Total Care Verbally PU Stage and Appropriate Care                       Personal Care Assistance Level of Assistance  Bathing, Dressing Bathing Assistance: Maximum assistance   Dressing Assistance: Maximum  assistance     Functional Limitations Info  Hearing          SPECIAL CARE FACTORS FREQUENCY  PT (By licensed PT)                    Contractures Contractures Info: Not present    Additional Factors Info  Code Status, Allergies, Isolation Precautions Code Status Info: full Allergies Info: nka           Current Medications (11/14/2016):  This is the current hospital active medication list Current Facility-Administered Medications  Medication Dose Route Frequency Provider Last Rate Last Dose  . [MAR Hold] ampicillin (OMNIPEN) 2 g in sodium chloride 0.9 % 50 mL IVPB  2 g Intravenous Q6H Randall Hissornelius N Van [place], MD   2 g at 11/14/16 1240  . [MAR Hold] aspirin EC tablet 81 mg  81 mg Oral 1 day or 1 dose Katha HammingSnehalatha Konidena, MD   81 mg at 11/13/16 2322  . [MAR Hold] atorvastatin (LIPITOR) tablet 10 mg  10 mg Oral QHS Katha HammingSnehalatha Konidena, MD   10 mg at 11/13/16 2034  . [MAR Hold] carvedilol (COREG) tablet 3.125 mg  3.125 mg Oral BID WC Katha HammingSnehalatha Konidena, MD   3.125 mg at 11/14/16 0845  . [MAR Hold] collagenase (SANTYL) ointment   Topical Daily Wyatt Hasteavid K Hower, MD      . Mitzi Hansen[MAR Hold] diphenhydrAMINE (BENADRYL) capsule 25 mg  25 mg Oral Q6H PRN Katha HammingSnehalatha Konidena, MD      . Mitzi Hansen[MAR Hold] enoxaparin (LOVENOX) injection  30 mg  30 mg Subcutaneous Q24H Jodelle RedMary M FarmersvilleSwayne, RPH   30 mg at 11/13/16 2035  . [MAR Hold] feeding supplement (GLUCERNA SHAKE) (GLUCERNA SHAKE) liquid 237 mL  237 mL Oral TID BM Wyatt Hasteavid K Hower, MD   237 mL at 11/13/16 2034  . [MAR Hold] furosemide (LASIX) tablet 20 mg  20 mg Oral Daily Katha HammingSnehalatha Konidena, MD   20 mg at 11/14/16 0846  . [MAR Hold] gabapentin (NEURONTIN) capsule 200 mg  200 mg Oral BID Katha HammingSnehalatha Konidena, MD   200 mg at 11/14/16 0844  . [MAR Hold] insulin aspart (novoLOG) injection 0-15 Units  0-15 Units Subcutaneous TID WC Wyatt Hasteavid K Hower, MD   3 Units at 11/14/16 0912  . [MAR Hold] insulin aspart (novoLOG) injection 0-5 Units  0-5 Units Subcutaneous QHS Wyatt Hasteavid K  Hower, MD   2 Units at 11/13/16 2321  . [MAR Hold] insulin aspart protamine- aspart (NOVOLOG MIX 70/30) injection 19 Units  19 Units Subcutaneous Q supper Wyatt Hasteavid K Hower, MD   19 Units at 11/13/16 1745  . [MAR Hold] insulin aspart protamine- aspart (NOVOLOG MIX 70/30) injection 42 Units  42 Units Subcutaneous Q breakfast Wyatt Hasteavid K Hower, MD   42 Units at 11/13/16 0910  . [MAR Hold] lisinopril (PRINIVIL,ZESTRIL) tablet 2.5 mg  2.5 mg Oral Daily Katha HammingSnehalatha Konidena, MD   2.5 mg at 11/14/16 0845  . [MAR Hold] loratadine (CLARITIN) tablet 10 mg  10 mg Oral Daily Katha HammingSnehalatha Konidena, MD   10 mg at 11/14/16 0846  . [MAR Hold] Vitamin D (Ergocalciferol) (DRISDOL) capsule 50,000 Units  50,000 Units Oral Weekly Katha HammingSnehalatha Konidena, MD   Stopped at 11/10/16 [phone]     Discharge Medications: Please see discharge summary for a list of discharge medications.  Relevant Imaging Results:  Relevant Lab Results:   Additional Information ss: [id number]; Patient will require 2 weeks of IV unasyn for enterococcus bacteremia  York SpanielMonica Marra, LCSW

## 2016-11-14 NOTE — H&P (Signed)
[facility] History & Physical Update  The patient was interviewed and re-examined.  The patient's previous History and Physical has been reviewed and is unchanged.  There is no change in the plan of care. We plan to proceed with the scheduled procedure.  Festus BarrenJason Dezire Turk, MD  11/14/2016, 4:10 PM

## 2016-11-14 NOTE — Plan of Care (Signed)
Problem: Bowel/Gastric: Goal: Will not experience complications related to bowel motility Outcome: Progressing Pt is progressing toward discharge. Pt has had angioplasty completed and PICC line inserted for extended antibiotic therapy. Pt is aware of plan of care but is sometimes forgetful.

## 2016-11-14 NOTE — Op Note (Signed)
[facility] Percutaneous Study/Intervention Procedural Note   Date of Surgery: 11/09/2016 - 11/14/2016  Surgeon(s):Aysia Lowder   Assistants:none  Pre-operative Diagnosis: PAD with ulceration bilateral lower extremities  Post-operative diagnosis: Same  Procedure(s) Performed: 1. Ultrasound guidance for vascular access right femoral artery 2. Catheter placement into left peroneal artery from right femoral approach 3. Aortogram and selective left lower extremity angiogram 4. Percutaneous transluminal angioplasty of left distal SFA and proximal popliteal artery with 5 mm diameter by 8 cm length Lutonix drug-coated angioplasty balloon 5. Percutaneous transluminal angioplasty of the left tibioperoneal trunk and proximal peroneal artery with 3 mm diameter by 10 cm length angioplasty balloon    EBL: Minimal  Contrast: 60 cc  Fluoro Time: 2.4 minutes  Moderate Conscious Sedation Time: approximately 20 minutes using 2 mg of Versed and 50 mcg of Fentanyl  Indications: [name] a 82 y.o.female with nonhealing ulcers on both feet and poorly palpable pedal pulses bilaterally. The patient is brought in for angiography for further evaluation and potential treatment. Risks and benefits are discussed and informed consent is obtained  Procedure: The patient was identified and appropriate procedural time out was performed. The patient was then placed supine on the table and prepped and draped in the usual sterile fashion.Moderate conscious sedation was administered during a face to face encounter with the patient throughout the procedure with my supervision of the RN administering medicines and monitoring the patient's vital signs, pulse oximetry, telemetry and mental status throughout from the start of the procedure until the patient was taken to the recovery room. Ultrasound was used to evaluate  the right common femoral artery. It was small and diseased, but flow was identified. A digital ultrasound image was acquired. A Seldinger needle was used to access the right common femoral artery under direct ultrasound guidance and a permanent image was performed. A 0.035 J wire was advanced without resistance and a 5Fr sheath was placed. Pigtail catheter was placed into the aorta and an AP aortogram was performed. This demonstrated normal renal arteries and normal aorta and iliac segments without significant stenosis. I then crossed the aortic bifurcation and advanced to the left femoral head. Selective left lower extremity angiogram was then performed. This demonstrated that she had a normal common femoral artery, profunda femoris artery, and proximal superficial femoral artery. Moderate lesion at Hunter's canal which appeared to have about a 50-60% stenosis. There was then about an 80% stenosis in the tibioperoneal trunk tracking down into the proximal peroneal artery which was the only runoff distally. The anterior tibial artery and posterior tibial arteries had long segment occlusions. The patient was systemically heparinized and a 6 [place] Ansell sheath was then placed over the [employer] wire. I then used a Kumpe catheter and the advantage wire to navigate through the Hunter's canal moderate stenosis in the more high-grade tibioperoneal trunk and proximal peroneal artery lesions. The wire was then parked at the ankle. Intervention was then performed with a 5 mm diameter by 8 cm length Lutonix drug-coated angioplasty balloon in the distal SFA and proximal popliteal artery. This was inflated to 14 atm to break the waist and the inflation was held for a minute. Completion angiogram showed only about a 10% residual stenosis. I then turned my attention to the tibioperoneal trunk. A 3 mm diameter by 10 cm length conventional angioplasty balloon was deployed in the first several centimeters of the  peroneal artery and throughout the entire tibioperoneal trunk back to the distal popliteal artery. This  was inflated to 14 atm to break the waist and the inflation was held for approximately a minute. Completion angiogram showed only about a 15-20% residual stenosis in the tibioperoneal trunk with brisk flow distally I elected to terminate the procedure. The sheath was removed and pressure was held with excellent hemostatic result. A closure device was not used due to what was demonstrated to be a high-grade stenosis of the femoral artery around the sheath. She is artery plan to have the right lower extremity evaluated with angiogram and this would likely be addressed at that time. The patient was taken to the recovery room in stable condition having tolerated the procedure well.  Findings:  Aortogram: Normal renal arteries, normal aorta, and no obvious iliac artery stenosis identified. Left Lower Extremity:She had a normal common femoral artery, profunda femoris artery, and proximal superficial femoral artery. Moderate lesion at Hunter's canal which appeared to have about a 50-60% stenosis. There was then about an 80% stenosis in the tibioperoneal trunk tracking down into the proximal peroneal artery which was the only runoff distally. The anterior tibial artery and posterior tibial arteries had long segment occlusions.   Disposition: Patient was taken to the recovery room in stable condition having tolerated the procedure well.  Complications: None  Leotis Pain 11/14/2016 5:06 PM   This note was created with Dragon Medical transcription system. Any errors in dictation are purely unintentional.

## 2016-11-14 NOTE — Progress Notes (Signed)
[facility] [place] [facility]   PATIENT NAME: [name] [name]    MRN#:  [id number]  DATE OF BIRTH:  June 17, 1934  SUBJECTIVE:  Hospital Day: 5 days [name] Chickrnell Horvath is a 82 y.o. female presenting with Fall .   Overnight events: No overnight events Interval Events: no complaints  REVIEW OF SYSTEMS:  CONSTITUTIONAL: No fever,Positive fatigue or weakness.  EYES: No blurred or double vision.  EARS, NOSE, AND THROAT: No tinnitus or ear pain.  RESPIRATORY: No cough, shortness of breath, wheezing or hemoptysis.  CARDIOVASCULAR: No chest pain, orthopnea, edema.  GASTROINTESTINAL: No nausea, vomiting, diarrhea or abdominal pain.  GENITOURINARY: No dysuria, hematuria.  ENDOCRINE: No polyuria, nocturia,  HEMATOLOGY: No anemia, easy bruising or bleeding SKIN: No rash or lesion. MUSCULOSKELETAL: No joint pain or arthritis.  Positive left leg pain NEUROLOGIC: No tingling, numbness, weakness.  PSYCHIATRY: No anxiety or depression.   DRUG ALLERGIES:  No Known Allergies  VITALS:  Blood pressure (!) 111/40, pulse 75, temperature 99.2 F (37.3 C), temperature source Oral, resp. rate 17, height 5\' 2"  (1.575 m), weight 74.8 kg (164 lb 14.4 oz), SpO2 100 %.  PHYSICAL EXAMINATION:  VITAL SIGNS: Vitals:   11/14/16 0353 11/14/16 0753  BP: (!) 134/56 (!) 111/40  Pulse: 75 75  Resp:  17  Temp:  99.2 F (37.3 C)   GENERAL:82 y.o.female currently in no acute distress.  HEAD: Normocephalic, atraumatic.  EYES: Pupils equal, round, reactive to light. Extraocular muscles intact. No scleral icterus.  MOUTH: Moist mucosal membrane. Dentition intact. No abscess noted.  EAR, NOSE, THROAT: Clear without exudates. No external lesions.  NECK: Supple. No thyromegaly. No nodules. No JVD.  PULMONARY: Clear to ascultation, without wheeze rails or rhonci. No use of accessory muscles, Good respiratory effort. good air entry bilaterally CHEST: Nontender to palpation.   CARDIOVASCULAR: S1 and S2. Regular rate and rhythm. No murmurs, rubs, or gallops. No edema. Pedal pulses 2+ bilaterally.  GASTROINTESTINAL: Soft, nontender, nondistended. No masses. Positive bowel sounds. No hepatosplenomegaly.  MUSCULOSKELETAL: No swelling, clubbing, or edema. Range of motion full in all extremities.  NEUROLOGIC: Cranial nerves II through XII are intact. No gross focal neurological deficits. Sensation intact. Reflexes intact.  SKIN: Heel ulcer with necrotic tissue surrounding erythema left leg right-sided smaller lateral malleoli skin breakdown without erythema No ulceration, lesions, rashes, or cyanosis. Skin warm and dry. Turgor intact.  PSYCHIATRIC: Mood, affect within normal limits. The patient is awake, alert and oriented x 3. Insight, judgment intact.      LABORATORY PANEL:   CBC  Recent Labs Lab 11/12/16 0810  WBC 9.3  HGB 9.0*  HCT 27.1*  PLT 221   ------------------------------------------------------------------------------------------------------------------  Chemistries   Recent Labs Lab 11/09/16 1900  11/14/16 0532  NA 134*  --  136  K 4.5  --  5.2*  CL 104  --  107  CO2 25  --  24  GLUCOSE 60*  --  146*  BUN 32*  --  38*  CREATININE 1.25*  < > 1.34*  CALCIUM 8.4*  --  8.2*  AST 24  --   --   ALT 12*  --   --   ALKPHOS 71  --   --   BILITOT 0.4  --   --   < > = values in this interval not displayed. ------------------------------------------------------------------------------------------------------------------  Cardiac Enzymes  Recent Labs Lab 11/09/16 1231  TROPONINI 0.05*   ------------------------------------------------------------------------------------------------------------------  RADIOLOGY:  No results found.  EKG:  Orders placed or performed during the hospital encounter of 11/09/16  . EKG 12-Lead  . EKG 12-Lead  . ED EKG  . ED EKG    ASSESSMENT AND PLAN:   [name] Chickrnell Malmquist is a 82 y.o. female  presenting with Fall . Admitted 11/09/2016 : Day #: 5 days  1. Enterococcus Bacteremia: continue current antibiotics - follow cultures, 1/4 bottles only - case discussed with ID -IV unasyn PICC line today d/w pt ok with getting it placed -total 14 days  2. Diabetic foot ulcer: MRI negative - no osteomyelitis, vascular consult - for angio today 3. Type 2 diabetes, poorly controlled: upped insulin yesterday with better, still not optimal, control 4. Hypertension lisinopril 5. Hyperlipidemia unspecified statin therapy  To rehab in am if above w/u completed  All the records are reviewed and case discussed with Care Management/Social Workerr. Management plans discussed with the patient, family and they are in agreement.  CODE STATUS: full TOTAL TIME TAKING CARE OF THIS PATIENT: 30 minutes.   POSSIBLE D/C IN 2-3DAYS, DEPENDING ON CLINICAL CONDITION.   Abdimalik Mayorquin M.D on 11/14/2016 at 11:40 AM  Between 7am to 6pm - Pager - [phone]  After 6pm: House Pager: - [phone]  [facility]  Office  [phone]  CC: Primary care physician; Barbette ReichmannHANDE,VISHWANATH, MD

## 2016-11-14 NOTE — Progress Notes (Signed)
Shift assessment completd at 0720. Pt awakened easily, confused only to time and was reoriented. Pt is on room air, lungs are clear bilat. HR is regular, abdomen is soft, bs hypoactive. PIV #20 intact to lac, site is free of redness and swelling, PIV #22 also intact to R hand, site is asymptomatic. PPP, no edema noted. Pt has blud boot on bilat, toes are warm, dressing to l heel and to R ankle are intact. Pt denied pain. Pt is due for abdominal aortagram today, this writer called special procedures and was told that pt will most likely be done at the end of the day. This Clinical research associatewriter held pt's 70/30 insulin as pt is npo, but pt did receive am meds with sips. Srx2, call bell in reach.

## 2016-11-14 NOTE — Consult Note (Signed)
   [facility] Consult   11/14/2016  Frederich Chickrnell Coral 09-Mar-1934 [id number]   Consult received from inpatient diabetes coordinator for [facility] Care Management services. [name] eligible for [facility] through [facility]. Attempted to call into patient room without success. This liaison spoke with inpatient LCSW in regards to patients discharge plan. Plan is for patient to discharge to short term rehab facility but has not been finalized. [facility] Care Management will assign patient for follow-up once final discharge plans have been established. For questions please contact:   Ann-Marie Kluge RN, BSN [facility]  Hospital Liaison  [phone] Business Mobile [phone]) Toll free office

## 2016-11-15 ENCOUNTER — Encounter: Payer: Self-pay | Admitting: Vascular Surgery

## 2016-11-15 DIAGNOSIS — Z7902 Long term (current) use of antithrombotics/antiplatelets: Secondary | ICD-10-CM | POA: Diagnosis not present

## 2016-11-15 DIAGNOSIS — E559 Vitamin D deficiency, unspecified: Secondary | ICD-10-CM | POA: Diagnosis not present

## 2016-11-15 DIAGNOSIS — M6281 Muscle weakness (generalized): Secondary | ICD-10-CM | POA: Diagnosis not present

## 2016-11-15 DIAGNOSIS — R42 Dizziness and giddiness: Secondary | ICD-10-CM | POA: Diagnosis not present

## 2016-11-15 DIAGNOSIS — Z9181 History of falling: Secondary | ICD-10-CM | POA: Diagnosis not present

## 2016-11-15 DIAGNOSIS — L97429 Non-pressure chronic ulcer of left heel and midfoot with unspecified severity: Secondary | ICD-10-CM | POA: Diagnosis not present

## 2016-11-15 DIAGNOSIS — I110 Hypertensive heart disease with heart failure: Secondary | ICD-10-CM | POA: Diagnosis not present

## 2016-11-15 DIAGNOSIS — I70248 Atherosclerosis of native arteries of left leg with ulceration of other part of lower left leg: Secondary | ICD-10-CM | POA: Diagnosis present

## 2016-11-15 DIAGNOSIS — T814XXA Infection following a procedure, initial encounter: Secondary | ICD-10-CM | POA: Diagnosis not present

## 2016-11-15 DIAGNOSIS — Z8673 Personal history of transient ischemic attack (TIA), and cerebral infarction without residual deficits: Secondary | ICD-10-CM | POA: Diagnosis not present

## 2016-11-15 DIAGNOSIS — I70213 Atherosclerosis of native arteries of extremities with intermittent claudication, bilateral legs: Secondary | ICD-10-CM | POA: Diagnosis not present

## 2016-11-15 DIAGNOSIS — R7881 Bacteremia: Secondary | ICD-10-CM | POA: Diagnosis not present

## 2016-11-15 DIAGNOSIS — Z5189 Encounter for other specified aftercare: Secondary | ICD-10-CM | POA: Diagnosis not present

## 2016-11-15 DIAGNOSIS — J441 Chronic obstructive pulmonary disease with (acute) exacerbation: Secondary | ICD-10-CM | POA: Diagnosis not present

## 2016-11-15 DIAGNOSIS — Z818 Family history of other mental and behavioral disorders: Secondary | ICD-10-CM | POA: Diagnosis not present

## 2016-11-15 DIAGNOSIS — Z79899 Other long term (current) drug therapy: Secondary | ICD-10-CM | POA: Diagnosis not present

## 2016-11-15 DIAGNOSIS — L03116 Cellulitis of left lower limb: Secondary | ICD-10-CM | POA: Diagnosis not present

## 2016-11-15 DIAGNOSIS — Z825 Family history of asthma and other chronic lower respiratory diseases: Secondary | ICD-10-CM | POA: Diagnosis not present

## 2016-11-15 DIAGNOSIS — Z8249 Family history of ischemic heart disease and other diseases of the circulatory system: Secondary | ICD-10-CM | POA: Diagnosis not present

## 2016-11-15 DIAGNOSIS — D559 Anemia due to enzyme disorder, unspecified: Secondary | ICD-10-CM | POA: Diagnosis not present

## 2016-11-15 DIAGNOSIS — Z87891 Personal history of nicotine dependence: Secondary | ICD-10-CM | POA: Diagnosis not present

## 2016-11-15 DIAGNOSIS — J9602 Acute respiratory failure with hypercapnia: Secondary | ICD-10-CM | POA: Diagnosis not present

## 2016-11-15 DIAGNOSIS — I10 Essential (primary) hypertension: Secondary | ICD-10-CM | POA: Diagnosis not present

## 2016-11-15 DIAGNOSIS — J810 Acute pulmonary edema: Secondary | ICD-10-CM | POA: Diagnosis not present

## 2016-11-15 DIAGNOSIS — Z452 Encounter for adjustment and management of vascular access device: Secondary | ICD-10-CM | POA: Diagnosis not present

## 2016-11-15 DIAGNOSIS — I739 Peripheral vascular disease, unspecified: Secondary | ICD-10-CM | POA: Diagnosis not present

## 2016-11-15 DIAGNOSIS — E119 Type 2 diabetes mellitus without complications: Secondary | ICD-10-CM | POA: Diagnosis not present

## 2016-11-15 DIAGNOSIS — D649 Anemia, unspecified: Secondary | ICD-10-CM | POA: Diagnosis present

## 2016-11-15 DIAGNOSIS — I70238 Atherosclerosis of native arteries of right leg with ulceration of other part of lower right leg: Secondary | ICD-10-CM | POA: Diagnosis not present

## 2016-11-15 DIAGNOSIS — Z794 Long term (current) use of insulin: Secondary | ICD-10-CM | POA: Diagnosis not present

## 2016-11-15 DIAGNOSIS — E1165 Type 2 diabetes mellitus with hyperglycemia: Secondary | ICD-10-CM | POA: Diagnosis present

## 2016-11-15 DIAGNOSIS — E1140 Type 2 diabetes mellitus with diabetic neuropathy, unspecified: Secondary | ICD-10-CM | POA: Diagnosis not present

## 2016-11-15 DIAGNOSIS — E785 Hyperlipidemia, unspecified: Secondary | ICD-10-CM | POA: Diagnosis not present

## 2016-11-15 DIAGNOSIS — B952 Enterococcus as the cause of diseases classified elsewhere: Secondary | ICD-10-CM | POA: Diagnosis not present

## 2016-11-15 DIAGNOSIS — I5020 Unspecified systolic (congestive) heart failure: Secondary | ICD-10-CM | POA: Diagnosis not present

## 2016-11-15 DIAGNOSIS — E1151 Type 2 diabetes mellitus with diabetic peripheral angiopathy without gangrene: Secondary | ICD-10-CM | POA: Diagnosis not present

## 2016-11-15 DIAGNOSIS — N183 Chronic kidney disease, stage 3 (moderate): Secondary | ICD-10-CM | POA: Diagnosis not present

## 2016-11-15 DIAGNOSIS — L97529 Non-pressure chronic ulcer of other part of left foot with unspecified severity: Secondary | ICD-10-CM | POA: Diagnosis not present

## 2016-11-15 DIAGNOSIS — R0603 Acute respiratory distress: Secondary | ICD-10-CM | POA: Diagnosis not present

## 2016-11-15 DIAGNOSIS — E11621 Type 2 diabetes mellitus with foot ulcer: Secondary | ICD-10-CM | POA: Diagnosis not present

## 2016-11-15 DIAGNOSIS — Z7982 Long term (current) use of aspirin: Secondary | ICD-10-CM | POA: Diagnosis not present

## 2016-11-15 DIAGNOSIS — R0602 Shortness of breath: Secondary | ICD-10-CM | POA: Diagnosis present

## 2016-11-15 DIAGNOSIS — N179 Acute kidney failure, unspecified: Secondary | ICD-10-CM | POA: Diagnosis not present

## 2016-11-15 DIAGNOSIS — R0600 Dyspnea, unspecified: Secondary | ICD-10-CM | POA: Diagnosis not present

## 2016-11-15 DIAGNOSIS — J9601 Acute respiratory failure with hypoxia: Secondary | ICD-10-CM | POA: Diagnosis not present

## 2016-11-15 DIAGNOSIS — I5031 Acute diastolic (congestive) heart failure: Secondary | ICD-10-CM | POA: Diagnosis not present

## 2016-11-15 DIAGNOSIS — G629 Polyneuropathy, unspecified: Secondary | ICD-10-CM | POA: Diagnosis not present

## 2016-11-15 LAB — BASIC METABOLIC PANEL
Anion gap: 5 (ref 5–15)
BUN: 35 mg/dL — AB (ref 6–20)
CHLORIDE: 106 mmol/L (ref 101–111)
CO2: 25 mmol/L (ref 22–32)
CREATININE: 1.36 mg/dL — AB (ref 0.44–1.00)
Calcium: 8.1 mg/dL — ABNORMAL LOW (ref 8.9–10.3)
GFR calc Af Amer: 41 mL/min — ABNORMAL LOW (ref >60)
GFR calc non Af Amer: 36 mL/min — ABNORMAL LOW (ref >60)
Glucose, Bld: 211 mg/dL — ABNORMAL HIGH (ref 65–99)
POTASSIUM: 4.9 mmol/L (ref 3.5–5.1)
SODIUM: 136 mmol/L (ref 135–145)

## 2016-11-15 LAB — GLUCOSE, CAPILLARY
GLUCOSE-CAPILLARY: 200 mg/dL — AB (ref 65–99)
Glucose-Capillary: 183 mg/dL — ABNORMAL HIGH (ref 65–99)
Glucose-Capillary: 181 mg/dL — ABNORMAL HIGH (ref 65–99)

## 2016-11-15 MED ORDER — ENOXAPARIN SODIUM 40 MG/0.4ML ~~LOC~~ SOLN: 40.0000 mg | SUBCUTANEOUS | Status: DC

## 2016-11-15 MED ORDER — SODIUM CHLORIDE 0.9 % IV SOLN: 2.0000 g | Freq: Four times a day (QID) | INTRAVENOUS | 0 refills | Status: DC

## 2016-11-15 MED ORDER — CLOPIDOGREL BISULFATE 75 MG PO TABS: 75.0000 mg | ORAL_TABLET | Freq: Every day | ORAL | 1 refills | Status: AC

## 2016-11-15 MED ORDER — COLLAGENASE 250 UNIT/GM EX OINT: TOPICAL_OINTMENT | Freq: Every day | CUTANEOUS | 0 refills | Status: AC

## 2016-11-15 MED ORDER — GLUCERNA SHAKE PO LIQD: 237.0000 mL | Freq: Three times a day (TID) | ORAL | 0 refills | Status: DC

## 2016-11-15 NOTE — Progress Notes (Signed)
Social work intern met with patient at bedside and gave bed offers. Patient has chosen to go to [facility]. [name], admissions coordinator at [facility] is aware of above. Social work intern will continue to assist and follow as needed.    , Social Work Intern  [phone] 

## 2016-11-15 NOTE — Progress Notes (Signed)
[place] INFECTIOUS DISEASE PROGRESS NOTE Date of Admission:  11/09/2016     ID: [name] [name] is a 82 y.o. female with heel and ankle ulcer and enterococcal bacteremia Active Problems:   Type 1 diabetes mellitus with diabetic heel ulcer (HCC)   Subjective: No fevers, feels better. S/p revascularization  ROS  Eleven systems are reviewed and negative except per hpi  Medications:  Antibiotics Given (last 72 hours)    Date/Time Action Medication Dose Rate   11/12/16 1753 Given   ampicillin (OMNIPEN) 2 g in sodium chloride 0.9 % 50 mL IVPB 2 g 150 mL/hr   11/13/16 0000 Given   ampicillin (OMNIPEN) 2 g in sodium chloride 0.9 % 50 mL IVPB 2 g 150 mL/hr   11/13/16 0600 Given   ampicillin (OMNIPEN) 2 g in sodium chloride 0.9 % 50 mL IVPB 2 g 150 mL/hr   11/13/16 1250 Given   ampicillin (OMNIPEN) 2 g in sodium chloride 0.9 % 50 mL IVPB 2 g 150 mL/hr   11/13/16 1744 Given   ampicillin (OMNIPEN) 2 g in sodium chloride 0.9 % 50 mL IVPB 2 g 150 mL/hr   11/13/16 2322 Given   ampicillin (OMNIPEN) 2 g in sodium chloride 0.9 % 50 mL IVPB 2 g 150 mL/hr   11/14/16 0549 Given   ampicillin (OMNIPEN) 2 g in sodium chloride 0.9 % 50 mL IVPB 2 g 150 mL/hr   11/14/16 1240 Given   ampicillin (OMNIPEN) 2 g in sodium chloride 0.9 % 50 mL IVPB 2 g 150 mL/hr   11/15/16 0027 Given   ampicillin (OMNIPEN) 2 g in sodium chloride 0.9 % 50 mL IVPB 2 g 150 mL/hr   11/15/16 0500 Given   ampicillin (OMNIPEN) 2 g in sodium chloride 0.9 % 50 mL IVPB 2 g 150 mL/hr   11/15/16 1149 Given   ampicillin (OMNIPEN) 2 g in sodium chloride 0.9 % 50 mL IVPB 2 g 150 mL/hr     . ampicillin (OMNIPEN) IV  2 g Intravenous Q6H  . aspirin EC  81 mg Oral 1 day or 1 dose  . atorvastatin  10 mg Oral QHS  . carvedilol  3.125 mg Oral BID WC  . clopidogrel  75 mg Oral Daily  . collagenase   Topical Daily  . enoxaparin (LOVENOX) injection  40 mg Subcutaneous Q24H  . feeding supplement (GLUCERNA SHAKE)  237 mL Oral TID BM   . furosemide  20 mg Oral Daily  . gabapentin  200 mg Oral BID  . insulin aspart  0-15 Units Subcutaneous TID WC  . insulin aspart  0-5 Units Subcutaneous QHS  . insulin aspart protamine- aspart  19 Units Subcutaneous Q supper  . insulin aspart protamine- aspart  42 Units Subcutaneous Q breakfast  . lisinopril  2.5 mg Oral Daily  . loratadine  10 mg Oral Daily  . Vitamin D (Ergocalciferol)  50,000 Units Oral Weekly    Objective: Vital signs in last 24 hours: Temp:  [98 F (36.7 C)-99.6 F (37.6 C)] 99.3 F (37.4 C) (11/28 1414) Pulse Rate:  [65-80] 80 (11/28 1414) Resp:  [12-22] 17 (11/28 1414) BP: (111-173)/(54-88) 124/66 (11/28 1414) SpO2:  [90 %-99 %] 96 % (11/28 1414) Constitutional: frail, disoriented HENT: [place]/AT, PERRLA, no scleral icterus Mouth/Throat: Oropharynx is clear and moist. No oropharyngeal exudate.  Cardiovascular: Normal rate, regular rhythm and normal heart sounds. Pulmonary/Chest: Effort normal and breath sounds normal. No respiratory distress.  has no wheezes.  Neck = supple, no nuchal  rigidity Abdominal: Soft. Bowel sounds are normal.  exhibits no distension. There is no tenderness.  Lymphadenopathy: no cervical adenopathy. No axillary adenopathy Ext no cce Neurological: more alert and interactive Skin: L heel with ulceration, thin eschar, mild surrounding erythema R lateral malleous with a shallow ulcer - clean base Psychiatric: a normal mood and affect.  behavior is normal.   Lab Results  Recent Labs  11/14/16 0532 11/15/16 0516  NA 136 136  K 5.2* 4.9  CL 107 106  CO2 24 25  BUN 38* 35*  CREATININE 1.34* 1.36*    Microbiology: Results for orders placed or performed during the hospital encounter of 11/09/16  Blood culture (routine x 2)     Status: None   Collection Time: 11/09/16  4:06 PM  Result Value Ref Range Status   Specimen Description BLOOD LEFT AC  Final   Special Requests   Final    BOTTLES DRAWN AEROBIC AND ANAEROBIC  AER 11CC  ANA 10   Culture NO GROWTH 5 DAYS  Final   Report Status 11/14/2016 FINAL  Final  Blood culture (routine x 2)     Status: Abnormal   Collection Time: 11/09/16  4:17 PM  Result Value Ref Range Status   Specimen Description BLOOD  RIGHT WRIST  Final   Special Requests   Final    BOTTLES DRAWN AEROBIC AND ANAEROBIC  AER 12CC ANA10CC   Culture  Setup Time   Final    GRAM POSITIVE COCCI AEROBIC BOTTLE ONLY CRITICAL RESULT CALLED TO, READ BACK BY AND VERIFIED WITH: SCOTT CHRISTY ON 11/10/16 AT 1327 QSD    Culture (A)  Final    ENTEROCOCCUS FAECALIS STAPHYLOCOCCUS SPECIES (COAGULASE NEGATIVE) THE SIGNIFICANCE OF ISOLATING THIS ORGANISM FROM A SINGLE SET OF BLOOD CULTURES WHEN MULTIPLE SETS ARE DRAWN IS UNCERTAIN. PLEASE NOTIFY THE MICROBIOLOGY DEPARTMENT WITHIN ONE WEEK IF SPECIATION AND SENSITIVITIES ARE REQUIRED. Performed at [facility]    Report Status 11/12/2016 FINAL  Final   Organism ID, Bacteria ENTEROCOCCUS FAECALIS  Final      Susceptibility   Enterococcus faecalis - MIC*    AMPICILLIN <=2 SENSITIVE Sensitive     VANCOMYCIN 1 SENSITIVE Sensitive     GENTAMICIN SYNERGY RESISTANT Resistant     * ENTEROCOCCUS FAECALIS  Blood Culture ID Panel (Reflexed)     Status: Abnormal   Collection Time: 11/09/16  4:17 PM  Result Value Ref Range Status   Enterococcus species DETECTED (A) NOT DETECTED Final    Comment: CRITICAL RESULT CALLED TO, READ BACK BY AND VERIFIED WITH: SCOTT CHRISTY ON 11/10/16 AT 1327 QSD    Vancomycin resistance NOT DETECTED NOT DETECTED Final   Listeria monocytogenes NOT DETECTED NOT DETECTED Final   Staphylococcus species DETECTED (A) NOT DETECTED Final    Comment: CRITICAL RESULT CALLED TO, READ BACK BY AND VERIFIED WITH: SCOTT CHRISTY ON 11/10/16 AT 1327 QSD    Staphylococcus aureus NOT DETECTED NOT DETECTED Final   Methicillin resistance DETECTED (A) NOT DETECTED Final    Comment: CRITICAL RESULT CALLED TO, READ BACK BY AND VERIFIED WITH: SCOTT  CHRISTY ON 11/10/16 AT 1327 QSD    Streptococcus species NOT DETECTED NOT DETECTED Final   Streptococcus agalactiae NOT DETECTED NOT DETECTED Final   Streptococcus pneumoniae NOT DETECTED NOT DETECTED Final   Streptococcus pyogenes NOT DETECTED NOT DETECTED Final   Acinetobacter baumannii NOT DETECTED NOT DETECTED Final   Enterobacteriaceae species NOT DETECTED NOT DETECTED Final   Enterobacter cloacae complex NOT DETECTED NOT  DETECTED Final   Escherichia coli NOT DETECTED NOT DETECTED Final   Klebsiella oxytoca NOT DETECTED NOT DETECTED Final   Klebsiella pneumoniae NOT DETECTED NOT DETECTED Final   Proteus species NOT DETECTED NOT DETECTED Final   Serratia marcescens NOT DETECTED NOT DETECTED Final   Haemophilus influenzae NOT DETECTED NOT DETECTED Final   Neisseria meningitidis NOT DETECTED NOT DETECTED Final   Pseudomonas aeruginosa NOT DETECTED NOT DETECTED Final   Candida albicans NOT DETECTED NOT DETECTED Final   Candida glabrata NOT DETECTED NOT DETECTED Final   Candida krusei NOT DETECTED NOT DETECTED Final   Candida parapsilosis NOT DETECTED NOT DETECTED Final   Candida tropicalis NOT DETECTED NOT DETECTED Final  Culture, blood (single) w Reflex to ID Panel     Status: None (Preliminary result)   Collection Time: 11/14/16 11:37 AM  Result Value Ref Range Status   Specimen Description BLOOD RIGHT HAND  Final   Special Requests   Final    BOTTLES DRAWN AEROBIC AND ANAEROBIC  AEROBIC 15CC, ANAEROBIC 12CC   Culture NO GROWTH < 24 HOURS  Final   Report Status PENDING  Incomplete    Studies/Results: Dg Chest Port 1 View  Result Date: 11/14/2016 CLINICAL DATA:  PICC line placement EXAM: PORTABLE CHEST 1 VIEW COMPARISON:  09/15/2016 FINDINGS: Cardiomediastinal silhouette is unremarkable. No infiltrate or pulmonary edema. There is right arm PICC line with tip in SVC right atrium junction. No pneumothorax. IMPRESSION: Right arm PICC line in place.  No pneumothorax. Electronically  Signed   By: Lahoma Crocker M.D.   On: 11/14/2016 14:19   IMPRESSION: 1. Nondisplaced fracture of the first proximal phalanx. 2. No evidence of osteomyelitis or soft tissue abscess. 3. 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  Assessment/Plan: Selam Pietsch is a 82 y.o. female with PVD with  diabetic heel ulcer and R ankle wound as well as enterococcal bacteremia. S/p revascularization 11/27 .  Initially had wbc 14 and fever to 102 but improving.  MRI L foot neg for osteomyelitis. Currently on ampicillin. ESR 107, CRP 11.7  Recommendations Continue outpatient fu  with vascular and podiatry.  Conitnue ampicillin for now for 2 week course from first negative bcx date She can follow up with podiatry and vascular and I can see if needed. Thank you very much for the consult. Will follow with you.  [place], Yovanni Frenette P   11/15/2016, 2:27 PM

## 2016-11-15 NOTE — Progress Notes (Signed)
Pt left the unit at this time via ems for transport to [facility]. Pt incontinent of stool before she left with this writer changing dressing to r groin and placing fresh dressing to R ankle. PICC line intact.

## 2016-11-15 NOTE — Discharge Summary (Addendum)
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 Diabetes mellitus without complication (HCC)   . Hypertension   . Stroke [facility])     HOSPITAL COURSE:   [name] [name] is a 82 y.o. female presenting with Fall . Admitted 11/09/2016 : Day #: 5 days  1. Enterococcus Bacteremia: continue current antibiotics - follow cultures, 1/4 bottles only - case discussed with ID -IV Ampicillin for total 2 weeks.  -s/p PICC line placed on 11/14/16  2. Diabetic foot ulcer with PAD: MRI negative - no osteomyelitis - vascular consult appreciated with s/p angiogram and s./p Percutaneous transluminal angioplasty of left distal SFA and proximal popliteal artery with 5 mm diameter by 8 cm length Lutonix drug-coated angioplasty balloon and Percutaneous transluminal angioplasty of the left tibioperoneal trunk and proximal peroneal artery with 3 mm diameter by 10 cm length angioplasty balloon -cont asa, added plavix  3. Type 2 diabetes, poorly controlled: upped insulin yesterday with better, still not optimal, control  4. Hypertension lisinopril  5. Hyperlipidemia unspecified statin therapy  Overall stable D/c to rehab for PT and IV abxs  D/w brother [name] [name] on the phone  CONSULTS OBTAINED:  Treatment Team:   Mick Sell, MD  DRUG ALLERGIES:  No Known Allergies  DISCHARGE MEDICATIONS:   Current Discharge Medication List    START taking these medications   Details  ampicillin 2 g in sodium chloride 0.9 % 50 mL Inject 2 g into the vein every 6 (six) hours. Qty: 32 ampule, Refills: 0    clopidogrel (PLAVIX) 75 MG tablet Take 1 tablet (75 mg total) by mouth daily. Qty: 30 tablet, Refills: 1    collagenase (SANTYL) ointment Apply topically daily. Qty: 15 g, Refills: 0    feeding supplement, GLUCERNA SHAKE, (GLUCERNA SHAKE) LIQD Take 237 mLs by mouth 3 (three) times daily between meals. Qty: 30 Can, Refills: 0      CONTINUE these medications which have NOT CHANGED   Details  aspirin EC 81 MG tablet Take 1 tablet by mouth 1 day or 1 dose.    atorvastatin (LIPITOR) 10 MG tablet Take 1 tablet by mouth at bedtime.     carvedilol (COREG) 3.125 MG tablet Take 1 tablet (3.125 mg total) by mouth 2 (two) times daily with a meal. Qty: 30 tablet, Refills: 0    diphenhydrAMINE (BENADRYL) 25 mg capsule Take 25 mg by mouth every 6 (six) hours as needed for itching.    furosemide (LASIX) 20 MG tablet Take 1 tablet (20 mg total) by mouth daily. Qty: 30 tablet, Refills: 0    gabapentin (NEURONTIN) 100 MG capsule Take 2 capsules by mouth 2 (two) times daily. Take 2 capsules (200mg ) twice a day    !! insulin aspart protamine- aspart (NOVOLOG MIX 70/30) (70-30) 100 UNIT/ML injection Inject 0.38 mLs (38 Units total) into the  skin daily with breakfast. Qty: 10 mL, Refills: 11    !! insulin aspart protamine- aspart (NOVOLOG MIX 70/30) (70-30) 100 UNIT/ML injection Inject 0.15 mLs (15 Units total) into the skin daily with supper. Qty: 10 mL, Refills: 11    lisinopril (PRINIVIL,ZESTRIL) 2.5 MG tablet Take 1 tablet (2.5 mg total) by mouth daily. Qty: 30 tablet, Refills: 0    loratadine (CLARITIN) 10 MG tablet Take 10 mg by mouth daily.    Vitamin D, Ergocalciferol, (DRISDOL) 50000 units CAPS  capsule Take 1 capsule by mouth once a week.     !! - Potential duplicate medications found. Please discuss with provider.    STOP taking these medications     KLOR-CON M20 20 MEQ tablet         If you experience worsening of your admission symptoms, develop shortness of breath, life threatening emergency, suicidal or homicidal thoughts you must seek medical attention immediately by calling 911 or calling your MD immediately  if symptoms less severe.  You Must read complete instructions/literature along with all the possible adverse reactions/side effects for all the Medicines you take and that have been prescribed to you. Take any new Medicines after you have completely understood and accept all the possible adverse reactions/side effects.   Please note  You were cared for by a hospitalist during your hospital stay. If you have any questions about your discharge medications or the care you received while you were in the hospital after you are discharged, you can call the unit and asked to speak with the hospitalist on call if the hospitalist that took care of you is not available. Once you are discharged, your primary care physician will handle any further medical issues. Please note that NO REFILLS for any discharge medications will be authorized once you are discharged, as it is imperative that you return to your primary care physician (or establish a relationship with a primary care physician if you do not have one) for your aftercare needs so that they can reassess your need for medications and monitor your lab values. Today   SUBJECTIVE   Doing well  VITAL SIGNS:  Blood pressure (!) 147/62, pulse 80, temperature 98.6 F (37 C), temperature source Oral, resp. rate 20, height 5\' 2"  (1.575 m), weight 74.8 kg (164 lb 14.4 oz), SpO2 99 %.  I/O:    Intake/Output Summary (Last 24 hours) at 11/15/16 1350 Last data filed at 11/15/16 1016  Gross per 24 hour  Intake              990 ml   Output                0 ml  Net              990 ml    PHYSICAL EXAMINATION:  GENERAL:  82 y.o.-year-old patient lying in the bed with no acute distress.  EYES: Pupils equal, round, reactive to light and accommodation. No scleral icterus. Extraocular muscles intact.  HEENT: Head atraumatic, normocephalic. Oropharynx and nasopharynx clear.  NECK:  Supple, no jugular venous distention. No thyroid enlargement, no tenderness.  LUNGS: Normal breath sounds bilaterally, no wheezing, rales,rhonchi or crepitation. No use of accessory muscles of respiration.  CARDIOVASCULAR: S1, S2 normal. No murmurs, rubs, or gallops.  ABDOMEN: Soft, non-tender, non-distended. Bowel sounds present. No organomegaly or mass.  EXTREMITIES: No pedal edema, cyanosis, or clubbing. Left foot dressing + NEUROLOGIC: Cranial nerves II through XII are intact. Muscle strength 5/5 in  all extremities. Sensation intact. Gait not checked.  PSYCHIATRIC: The patient is alert and oriented x 3.  SKIN: No obvious rash, lesion, or ulcer.   DATA REVIEW:   CBC   Recent Labs Lab 11/12/16 0810  WBC 9.3  HGB 9.0*  HCT 27.1*  PLT 221    Chemistries   Recent Labs Lab 11/09/16 1900  11/15/16 0516  NA 134*  < > 136  K 4.5  < > 4.9  CL 104  < > 106  CO2 25  < > 25  GLUCOSE 60*  < > 211*  BUN 32*  < > 35*  CREATININE 1.25*  < > 1.36*  CALCIUM 8.4*  < > 8.1*  AST 24  --   --   ALT 12*  --   --   ALKPHOS 71  --   --   BILITOT 0.4  --   --   < > = values in this interval not displayed.  Microbiology Results   Recent Results (from the past 240 hour(s))  Blood culture (routine x 2)     Status: None   Collection Time: 11/09/16  4:06 PM  Result Value Ref Range Status   Specimen Description BLOOD LEFT AC  Final   Special Requests   Final    BOTTLES DRAWN AEROBIC AND ANAEROBIC  AER 11CC ANA 10   Culture NO GROWTH 5 DAYS  Final   Report Status 11/14/2016 FINAL  Final  Blood culture (routine x 2)     Status: Abnormal    Collection Time: 11/09/16  4:17 PM  Result Value Ref Range Status   Specimen Description BLOOD  RIGHT WRIST  Final   Special Requests   Final    BOTTLES DRAWN AEROBIC AND ANAEROBIC  AER 12CC ANA10CC   Culture  Setup Time   Final    GRAM POSITIVE COCCI AEROBIC BOTTLE ONLY CRITICAL RESULT CALLED TO, READ BACK BY AND VERIFIED WITH: SCOTT CHRISTY ON 11/10/16 AT 1327 QSD    Culture (A)  Final    ENTEROCOCCUS FAECALIS STAPHYLOCOCCUS SPECIES (COAGULASE NEGATIVE) THE SIGNIFICANCE OF ISOLATING THIS ORGANISM FROM A SINGLE SET OF BLOOD CULTURES WHEN MULTIPLE SETS ARE DRAWN IS UNCERTAIN. PLEASE NOTIFY THE MICROBIOLOGY DEPARTMENT WITHIN ONE WEEK IF SPECIATION AND SENSITIVITIES ARE REQUIRED. [facility]    Report Status 11/12/2016 FINAL  Final   Organism ID, Bacteria ENTEROCOCCUS FAECALIS  Final      Susceptibility   Enterococcus faecalis - MIC*    AMPICILLIN <=2 SENSITIVE Sensitive     VANCOMYCIN 1 SENSITIVE Sensitive     GENTAMICIN SYNERGY RESISTANT Resistant     * ENTEROCOCCUS FAECALIS  Blood Culture ID Panel (Reflexed)     Status: Abnormal   Collection Time: 11/09/16  4:17 PM  Result Value Ref Range Status   Enterococcus species DETECTED (A) NOT DETECTED Final    Comment: CRITICAL RESULT CALLED TO, READ BACK BY AND VERIFIED WITH: SCOTT CHRISTY ON 11/10/16 AT 1327 QSD    Vancomycin resistance NOT DETECTED NOT DETECTED Final   Listeria monocytogenes NOT DETECTED NOT DETECTED Final   Staphylococcus species DETECTED (A) NOT DETECTED Final    Comment: CRITICAL RESULT CALLED TO, READ BACK BY AND VERIFIED WITH: SCOTT CHRISTY ON 11/10/16 AT 1327 QSD    Staphylococcus aureus NOT DETECTED NOT DETECTED Final   Methicillin resistance DETECTED (A) NOT DETECTED Final    Comment: CRITICAL RESULT CALLED TO, READ BACK BY AND VERIFIED WITH: SCOTT CHRISTY ON 11/10/16 AT  1327 QSD    Streptococcus species NOT DETECTED NOT DETECTED Final   Streptococcus agalactiae NOT DETECTED NOT  DETECTED Final   Streptococcus pneumoniae NOT DETECTED NOT DETECTED Final   Streptococcus pyogenes NOT DETECTED NOT DETECTED Final   Acinetobacter baumannii NOT DETECTED NOT DETECTED Final   Enterobacteriaceae species NOT DETECTED NOT DETECTED Final   Enterobacter cloacae complex NOT DETECTED NOT DETECTED Final   Escherichia coli NOT DETECTED NOT DETECTED Final   Klebsiella oxytoca NOT DETECTED NOT DETECTED Final   Klebsiella pneumoniae NOT DETECTED NOT DETECTED Final   Proteus species NOT DETECTED NOT DETECTED Final   Serratia marcescens NOT DETECTED NOT DETECTED Final   Haemophilus influenzae NOT DETECTED NOT DETECTED Final   Neisseria meningitidis NOT DETECTED NOT DETECTED Final   Pseudomonas aeruginosa NOT DETECTED NOT DETECTED Final   Candida albicans NOT DETECTED NOT DETECTED Final   Candida glabrata NOT DETECTED NOT DETECTED Final   Candida krusei NOT DETECTED NOT DETECTED Final   Candida parapsilosis NOT DETECTED NOT DETECTED Final   Candida tropicalis NOT DETECTED NOT DETECTED Final  Culture, blood (single) w Reflex to ID Panel     Status: None (Preliminary result)   Collection Time: 11/14/16 11:37 AM  Result Value Ref Range Status   Specimen Description BLOOD RIGHT HAND  Final   Special Requests   Final    BOTTLES DRAWN AEROBIC AND ANAEROBIC  AEROBIC 15CC, ANAEROBIC 12CC   Culture NO GROWTH < 24 HOURS  Final   Report Status PENDING  Incomplete    RADIOLOGY:  Dg Chest Port 1 View  Result Date: 11/14/2016 CLINICAL DATA:  PICC line placement EXAM: PORTABLE CHEST 1 VIEW COMPARISON:  09/15/2016 FINDINGS: Cardiomediastinal silhouette is unremarkable. No infiltrate or pulmonary edema. There is right arm PICC line with tip in SVC right atrium junction. No pneumothorax. IMPRESSION: Right arm PICC line in place.  No pneumothorax. Electronically Signed   By: Natasha Mead M.D.   On: 11/14/2016 14:19     Management plans discussed with the patient, family and they are in  agreement.  CODE STATUS:  Code Status History    Date Active Date Inactive Code Status Order ID Comments User Context   09/15/2016  3:07 AM 09/15/2016 10:43 AM Full Code [id number]  Tonye Royalty, DO ED   08/21/2016 11:23 PM 08/25/2016  7:05 PM Full Code [id number]  Tonye Royalty, DO Inpatient      TOTAL TIME TAKING CARE OF THIS PATIENT: 40 minutes.    Kiyo Heal M.D on 11/15/2016 at 1:50 PM  Between 7am to 6pm - Pager - [phone] After 6pm go to [email or web address] - password [email or web address] [facility]  [facility]  Office  [phone]  CC: Primary care physician; Barbette Reichmann, MD

## 2016-11-15 NOTE — Care Management Important Message (Signed)
Important Message  Patient Details  Name: Frederich Chickrnell Gacek MRN: [id number] Date of Birth: [birth year]   Medicare Important Message Given:  Yes    Marily MemosLisa M Chaz Ronning, RN 11/15/2016, 1:33 PM

## 2016-11-15 NOTE — NC FL2 (Signed)
[place] MEDICAID FL2 LEVEL OF CARE SCREENING TOOL     IDENTIFICATION  Patient Name: [name] [name] Birthdate: April 10, 1934 Sex: female Admission Date (Current Location): 11/09/2016  [place]   Facility and Address:  [facility], [address], [place], [place] [address]      Provider Number: [id number]  Attending Physician Name and Address:  Enedina FinnerSona Tytus Strahle, MD  Relative Name and Phone Number:       Current Level of Care: Hospital Recommended Level of Care: Skilled Nursing Facility Prior Approval Number:    Date Approved/Denied:   PASRR Number: [id number]  Discharge Plan: SNF    Current Diagnoses: Patient Active Problem List   Diagnosis Date Noted  . 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 Acute respiratory failure with hypoxia (HCC) 09/15/2016  . Carpal tunnel syndrome 09/01/2016  . Hypertension 09/01/2016  . PVD (peripheral vascular disease) (HCC) 09/01/2016  . Open abdominal wall wound 09/01/2016  . Abscess 08/21/2016  . Type 2 diabetes mellitus with complication (HCC) 05/20/2015  . Visual disturbance 05/20/2015  . H/O: CVA (cerebrovascular accident) 01/21/2015  . Difficulty in walking 08/27/2014  . Diabetes (HCC) 08/20/2014  . Diabetic neuropathy (HCC) 08/20/2014  . Dizzy spells 08/20/2014  . Hand numbness 08/20/2014  . Obesity 08/20/2014    Orientation RESPIRATION BLADDER Height & Weight     Self, Place  Normal Incontinent Weight: 164 lb 14.4 oz (74.8 kg) Height:  5\' 2"  (157.5 cm)  BEHAVIORAL SYMPTOMS/MOOD NEUROLOGICAL BOWEL NUTRITION STATUS   (none)  (none) Incontinent Diet (npo currently; to be advanced)  AMBULATORY STATUS COMMUNICATION OF NEEDS Skin   Total Care Verbally PU Stage and Appropriate Care                       Personal Care Assistance Level of Assistance  Bathing, Dressing Bathing Assistance: Maximum assistance   Dressing Assistance: Maximum  assistance     Functional Limitations Info  Hearing          SPECIAL CARE FACTORS FREQUENCY  PT (By licensed PT)                    Contractures Contractures Info: Not present    Additional Factors Info  Code Status, Allergies, Isolation Precautions Code Status Info: full Allergies Info: nka           Current Medications (11/15/2016):  This is the current hospital active medication list Current Facility-Administered Medications  Medication Dose Route Frequency Provider Last Rate Last Dose  . ampicillin (OMNIPEN) 2 g in sodium chloride 0.9 % 50 mL IVPB  2 g Intravenous Q6H Randall Hissornelius N Van Dam, MD   2 g at 11/15/16 0500  . aspirin EC tablet 81 mg  81 mg Oral 1 day or 1 dose Katha HammingSnehalatha Konidena, MD   81 mg at 11/15/16 0027  . atorvastatin (LIPITOR) tablet 10 mg  10 mg Oral QHS Katha HammingSnehalatha Konidena, MD   10 mg at 11/14/16 2030  . carvedilol (COREG) tablet 3.125 mg  3.125 mg Oral BID WC Katha HammingSnehalatha Konidena, MD   3.125 mg at 11/15/16 0821  . clopidogrel (PLAVIX) tablet 75 mg  75 mg Oral Daily Annice NeedyJason S Dew, MD   75 mg at 11/15/16 0910  . collagenase (SANTYL) ointment   Topical Daily Wyatt Hasteavid K Hower, MD      . diphenhydrAMINE (BENADRYL) capsule 25 mg  25 mg Oral Q6H PRN Katha HammingSnehalatha Konidena, MD      .  enoxaparin (LOVENOX) injection 30 mg  30 mg Subcutaneous Q24H Jodelle RedMary M HoltSwayne, RPH   30 mg at 11/14/16 2030  . feeding supplement (GLUCERNA SHAKE) (GLUCERNA SHAKE) liquid 237 mL  237 mL Oral TID BM Wyatt Hasteavid K Hower, MD   237 mL at 11/14/16 2031  . furosemide (LASIX) tablet 20 mg  20 mg Oral Daily Katha HammingSnehalatha Konidena, MD   20 mg at 11/15/16 0910  . gabapentin (NEURONTIN) capsule 200 mg  200 mg Oral BID Katha HammingSnehalatha Konidena, MD   200 mg at 11/15/16 0910  . insulin aspart (novoLOG) injection 0-15 Units  0-15 Units Subcutaneous TID WC Wyatt Hasteavid K Hower, MD   3 Units at 11/15/16 0820  . insulin aspart (novoLOG) injection 0-5 Units  0-5 Units Subcutaneous QHS Wyatt Hasteavid K Hower, MD   2 Units at 11/14/16  2122  . insulin aspart protamine- aspart (NOVOLOG MIX 70/30) injection 19 Units  19 Units Subcutaneous Q supper Wyatt Hasteavid K Hower, MD   19 Units at 11/13/16 1745  . insulin aspart protamine- aspart (NOVOLOG MIX 70/30) injection 42 Units  42 Units Subcutaneous Q breakfast Wyatt Hasteavid K Hower, MD   42 Units at 11/15/16 0820  . lisinopril (PRINIVIL,ZESTRIL) tablet 2.5 mg  2.5 mg Oral Daily Katha HammingSnehalatha Konidena, MD   2.5 mg at 11/15/16 0911  . loratadine (CLARITIN) tablet 10 mg  10 mg Oral Daily Katha HammingSnehalatha Konidena, MD   10 mg at 11/15/16 0910  . Vitamin D (Ergocalciferol) (DRISDOL) capsule 50,000 Units  50,000 Units Oral Weekly Katha HammingSnehalatha Konidena, MD   Stopped at 11/10/16 [phone]     Discharge Medications: Please see discharge summary for a list of discharge medications.  Relevant Imaging Results:  Relevant Lab Results:   Additional Information ss: [id number]; Patient will require 2 weeks of IV unasyn for enterococcus bacteremia  Sample, Darleen CrockerBailey M, LCSW

## 2016-11-15 NOTE — Progress Notes (Signed)
Planning a right leg angiogram Friday if patient is still in house.  This can be done as an outpatient at a later date as well if patient is ready for discharge otherwise.

## 2016-11-15 NOTE — Progress Notes (Signed)
Shift assessment completedat 0745. Pt is awake, alert to herself and place, is reoriented to time and the events of yesterday. Pt accepting of the information, stated that she knows she doesn't "feel right" since she was found on the floor at home and hopes that she can get herself back to normal. Pt is on room air, stated to this writer that one of her eyes has partial site. Lungs are clear bilat, pt is on room air, hr is regular, abdomen is soft, bs heard. Pt has dressing intact to her r groin that is dry, is wearing incontinence brief. PICC line intact to r upper arm ,site si free of redness and swelling. PIV #22 intact to R wrist and PIV intact to l arm, both sites are asymptomatic. Ppp, feet are in bilat blue boots. Since assessment, pt has had dressings changed to bilat feet, Dr. Allena KatzPatel has rounded, intends to d/c pt to snf today. Pt has had no complaints of pain.

## 2016-11-15 NOTE — Progress Notes (Signed)
This Clinical research associatewriter has called [facility] to [facility], pt is awaiting ems.

## 2016-11-15 NOTE — Discharge Instructions (Signed)
PT per instruction PICC line care per Protocol

## 2016-11-15 NOTE — Clinical Social Work Note (Signed)
Clinical Social Work Assessment  Patient Details  Name: [name] [name] MRN: [id number] Date of Birth: 1934-12-02  Date of referral:  11/15/16               Reason for consult:  Facility Placement                Permission sought to share information with:  Chartered certified accountant granted to share information::  Yes, Verbal Permission Granted  Name::      Fish farm manager::   [place]   Relationship::     Contact Information:     Housing/Transportation Living arrangements for the past 2 months:  [place] of Information:  Patient Patient Interpreter Needed:  None Criminal Activity/Legal Involvement Pertinent to Current Situation/Hospitalization:  No - Comment as needed Significant Relationships:  Adult Children, Siblings Lives with:  Self Do you feel safe going back to the place where you live?  Yes Need for family participation in patient care:  Yes (Comment)  Care giving concerns:  Patient lives alone in [place].    Social Worker assessment / plan:  Holiday representative (CSW) received verbal consult from MD that patient needs IV Abx at a SNF. CSW met with patient to discuss D/C plan. Patient reported that she lives alone in [place]. CSW discussed SNF placement and the need for IV Abx. [name] agreeable to SNF search and prefers [facility]. [facility] offered a bed and patient accepted.   [name] medically stable for D/C to [facility] today. Per [facility] admissions coordinator at [facility] patient will go to room 513. RN will call report to 500 hall RN and arrange EMS for transport. CSW sent D/C orders to [facility] via [place]. [facility] authorization has been received. Auth # [id number]. [name] aware of above. CSW contacted patient's brother Dominica Severin and made him aware of above. Please reconsult if future social work needs arise. CSW signing off.   Employment status:  Retired Office manager PT Recommendations:  [place] / Referral to community resources:  [place]  Patient/Family's Response to care:  [name] agreeable to go to [facility] today.   Patient/Family's Understanding of and Emotional Response to Diagnosis, Current Treatment, and Prognosis:  Patient was pleasant and thanked CSW for assistance.   Emotional Assessment Appearance:  Appears stated age Attitude/Demeanor/Rapport:    Affect (typically observed):  Accepting, Adaptable, Pleasant Orientation:  Oriented to Self, Oriented to Place, Oriented to  Time, Fluctuating Orientation (Suspected and/or reported Sund[place]) Alcohol / Substance use:  Not Applicable Psych involvement (Current and /or in the community):  No (Comment)  Discharge Needs  Concerns to be addressed:  Discharge Planning Concerns Readmission within the last 30 days:  No Current discharge risk:  Dependent with Mobility, None Barriers to Discharge:  No Barriers Identified   [name] [name], [name] Beets, LCSW 11/15/2016, 3:40 PM

## 2016-11-15 NOTE — Clinical Social Work Placement (Signed)
   CLINICAL SOCIAL WORK PLACEMENT  NOTE  Date:  11/15/2016  Patient Details  Name: [name] [name] MRN: [id number] Date of Birth: 01-Mar-1934  Clinical Social Work is seeking post-discharge placement for this patient at the Skilled  Nursing Facility level of care (*CSW will initial, date and re-position this form in  chart as items are completed):  Yes   Patient/family provided with [place] Clinical Social Work Department's list of facilities offering this level of care within the geographic area requested by the patient (or if unable, by the patient's family).  Yes   Patient/family informed of their freedom to choose among providers that offer the needed level of care, that participate in Medicare, Medicaid or managed care program needed by the patient, have an available bed and are willing to accept the patient.  Yes   Patient/family informed of [facility]'s ownership interest in [facility] and [facility], as well as of the fact that they are under no obligation to receive care at these facilities.  PASRR submitted to EDS on 11/14/16     PASRR number received on 11/14/16     Existing PASRR number confirmed on       FL2 transmitted to all facilities in geographic area requested by pt/family on 11/14/16     FL2 transmitted to all facilities within larger geographic area on       Patient informed that his/her managed care company has contracts with or will negotiate with certain facilities, including the following:        Yes   Patient/family informed of bed offers received.  Patient chooses bed at  [facility] )     Physician recommends and patient chooses bed at      Patient to be transferred to  [facility] ) on 11/15/16.  Patient to be transferred to facility by  [facility] )     Patient family notified on 11/15/16 of transfer.  Name of family member notified:   (Patient's brother Jillyn HiddenGary is aware of D/C today. )     PHYSICIAN        Additional Comment:    _______________________________________________ Ordell Prichett, Darleen CrockerBailey M, LCSW 11/15/2016, 3:39 PM

## 2016-11-16 DIAGNOSIS — E119 Type 2 diabetes mellitus without complications: Secondary | ICD-10-CM | POA: Diagnosis not present

## 2016-11-16 DIAGNOSIS — I739 Peripheral vascular disease, unspecified: Secondary | ICD-10-CM | POA: Diagnosis not present

## 2016-11-16 DIAGNOSIS — G629 Polyneuropathy, unspecified: Secondary | ICD-10-CM | POA: Diagnosis not present

## 2016-11-16 DIAGNOSIS — L03116 Cellulitis of left lower limb: Secondary | ICD-10-CM | POA: Diagnosis not present

## 2016-11-16 DIAGNOSIS — N183 Chronic kidney disease, stage 3 (moderate): Secondary | ICD-10-CM | POA: Diagnosis not present

## 2016-11-16 DIAGNOSIS — Z8673 Personal history of transient ischemic attack (TIA), and cerebral infarction without residual deficits: Secondary | ICD-10-CM | POA: Diagnosis not present

## 2016-11-17 ENCOUNTER — Other Ambulatory Visit (INDEPENDENT_AMBULATORY_CARE_PROVIDER_SITE_OTHER): Payer: Self-pay | Admitting: Vascular Surgery

## 2016-11-17 ENCOUNTER — Encounter (INDEPENDENT_AMBULATORY_CARE_PROVIDER_SITE_OTHER): Payer: Self-pay

## 2016-11-19 LAB — CULTURE, BLOOD (SINGLE): Culture: NO GROWTH

## 2016-11-20 ENCOUNTER — Emergency Department: Payer: Commercial Managed Care - HMO

## 2016-11-20 ENCOUNTER — Inpatient Hospital Stay
Admission: EM | Admit: 2016-11-20 | Discharge: 2016-11-23 | DRG: 166 | Disposition: A | Discharge status: Skilled Nursing Facility | Payer: Commercial Managed Care - HMO | Attending: Internal Medicine | Admitting: Internal Medicine

## 2016-11-20 ENCOUNTER — Encounter: Payer: Self-pay | Admitting: Emergency Medicine

## 2016-11-20 ENCOUNTER — Emergency Department
Admission: EM | Admit: 2016-11-20 | Discharge: 2016-11-20 | Disposition: A | Discharge status: Home / Self Care | Payer: Commercial Managed Care - HMO | Source: Home / Self Care | Attending: Emergency Medicine | Admitting: Emergency Medicine

## 2016-11-20 ENCOUNTER — Other Ambulatory Visit: Payer: Self-pay

## 2016-11-20 DIAGNOSIS — B952 Enterococcus as the cause of diseases classified elsewhere: Secondary | ICD-10-CM | POA: Diagnosis not present

## 2016-11-20 DIAGNOSIS — Z79899 Other long term (current) drug therapy: Secondary | ICD-10-CM

## 2016-11-20 DIAGNOSIS — Z452 Encounter for adjustment and management of vascular access device: Secondary | ICD-10-CM

## 2016-11-20 DIAGNOSIS — J9601 Acute respiratory failure with hypoxia: Secondary | ICD-10-CM | POA: Diagnosis not present

## 2016-11-20 DIAGNOSIS — J441 Chronic obstructive pulmonary disease with (acute) exacerbation: Secondary | ICD-10-CM | POA: Diagnosis present

## 2016-11-20 DIAGNOSIS — T148XXA Other injury of unspecified body region, initial encounter: Secondary | ICD-10-CM

## 2016-11-20 DIAGNOSIS — R262 Difficulty in walking, not elsewhere classified: Secondary | ICD-10-CM

## 2016-11-20 DIAGNOSIS — E11621 Type 2 diabetes mellitus with foot ulcer: Secondary | ICD-10-CM | POA: Diagnosis present

## 2016-11-20 DIAGNOSIS — E1151 Type 2 diabetes mellitus with diabetic peripheral angiopathy without gangrene: Secondary | ICD-10-CM | POA: Diagnosis present

## 2016-11-20 DIAGNOSIS — Z825 Family history of asthma and other chronic lower respiratory diseases: Secondary | ICD-10-CM | POA: Diagnosis not present

## 2016-11-20 DIAGNOSIS — I110 Hypertensive heart disease with heart failure: Secondary | ICD-10-CM | POA: Diagnosis not present

## 2016-11-20 DIAGNOSIS — R4182 Altered mental status, unspecified: Secondary | ICD-10-CM | POA: Diagnosis not present

## 2016-11-20 DIAGNOSIS — E1165 Type 2 diabetes mellitus with hyperglycemia: Secondary | ICD-10-CM | POA: Diagnosis present

## 2016-11-20 DIAGNOSIS — D649 Anemia, unspecified: Secondary | ICD-10-CM | POA: Diagnosis present

## 2016-11-20 DIAGNOSIS — Y828 Other medical devices associated with adverse incidents: Secondary | ICD-10-CM | POA: Insufficient documentation

## 2016-11-20 DIAGNOSIS — I10 Essential (primary) hypertension: Secondary | ICD-10-CM

## 2016-11-20 DIAGNOSIS — I5031 Acute diastolic (congestive) heart failure: Secondary | ICD-10-CM | POA: Diagnosis present

## 2016-11-20 DIAGNOSIS — Z87891 Personal history of nicotine dependence: Secondary | ICD-10-CM

## 2016-11-20 DIAGNOSIS — I70248 Atherosclerosis of native arteries of left leg with ulceration of other part of lower left leg: Secondary | ICD-10-CM | POA: Diagnosis present

## 2016-11-20 DIAGNOSIS — R7881 Bacteremia: Secondary | ICD-10-CM | POA: Diagnosis not present

## 2016-11-20 DIAGNOSIS — I70238 Atherosclerosis of native arteries of right leg with ulceration of other part of lower right leg: Secondary | ICD-10-CM | POA: Diagnosis present

## 2016-11-20 DIAGNOSIS — I739 Peripheral vascular disease, unspecified: Secondary | ICD-10-CM | POA: Diagnosis not present

## 2016-11-20 DIAGNOSIS — L97429 Non-pressure chronic ulcer of left heel and midfoot with unspecified severity: Secondary | ICD-10-CM | POA: Diagnosis not present

## 2016-11-20 DIAGNOSIS — E119 Type 2 diabetes mellitus without complications: Secondary | ICD-10-CM | POA: Diagnosis not present

## 2016-11-20 DIAGNOSIS — Z794 Long term (current) use of insulin: Secondary | ICD-10-CM | POA: Diagnosis not present

## 2016-11-20 DIAGNOSIS — R0603 Acute respiratory distress: Secondary | ICD-10-CM | POA: Diagnosis present

## 2016-11-20 DIAGNOSIS — M6281 Muscle weakness (generalized): Secondary | ICD-10-CM

## 2016-11-20 DIAGNOSIS — N179 Acute kidney failure, unspecified: Secondary | ICD-10-CM | POA: Diagnosis not present

## 2016-11-20 DIAGNOSIS — Z8673 Personal history of transient ischemic attack (TIA), and cerebral infarction without residual deficits: Secondary | ICD-10-CM

## 2016-11-20 DIAGNOSIS — Z7982 Long term (current) use of aspirin: Secondary | ICD-10-CM | POA: Insufficient documentation

## 2016-11-20 DIAGNOSIS — I70213 Atherosclerosis of native arteries of extremities with intermittent claudication, bilateral legs: Secondary | ICD-10-CM | POA: Diagnosis not present

## 2016-11-20 DIAGNOSIS — Z8249 Family history of ischemic heart disease and other diseases of the circulatory system: Secondary | ICD-10-CM

## 2016-11-20 DIAGNOSIS — R0602 Shortness of breath: Secondary | ICD-10-CM | POA: Diagnosis present

## 2016-11-20 DIAGNOSIS — J9602 Acute respiratory failure with hypercapnia: Secondary | ICD-10-CM | POA: Diagnosis not present

## 2016-11-20 DIAGNOSIS — J449 Chronic obstructive pulmonary disease, unspecified: Secondary | ICD-10-CM | POA: Diagnosis not present

## 2016-11-20 DIAGNOSIS — L089 Local infection of the skin and subcutaneous tissue, unspecified: Secondary | ICD-10-CM

## 2016-11-20 DIAGNOSIS — Z7902 Long term (current) use of antithrombotics/antiplatelets: Secondary | ICD-10-CM

## 2016-11-20 DIAGNOSIS — J811 Chronic pulmonary edema: Secondary | ICD-10-CM | POA: Diagnosis not present

## 2016-11-20 DIAGNOSIS — Z818 Family history of other mental and behavioral disorders: Secondary | ICD-10-CM

## 2016-11-20 DIAGNOSIS — L03116 Cellulitis of left lower limb: Secondary | ICD-10-CM | POA: Diagnosis not present

## 2016-11-20 DIAGNOSIS — E559 Vitamin D deficiency, unspecified: Secondary | ICD-10-CM | POA: Diagnosis not present

## 2016-11-20 DIAGNOSIS — E109 Type 1 diabetes mellitus without complications: Secondary | ICD-10-CM

## 2016-11-20 DIAGNOSIS — L89629 Pressure ulcer of left heel, unspecified stage: Secondary | ICD-10-CM | POA: Diagnosis not present

## 2016-11-20 DIAGNOSIS — J810 Acute pulmonary edema: Secondary | ICD-10-CM | POA: Diagnosis not present

## 2016-11-20 DIAGNOSIS — L89510 Pressure ulcer of right ankle, unstageable: Secondary | ICD-10-CM | POA: Diagnosis not present

## 2016-11-20 DIAGNOSIS — A499 Bacterial infection, unspecified: Secondary | ICD-10-CM | POA: Diagnosis not present

## 2016-11-20 DIAGNOSIS — L97529 Non-pressure chronic ulcer of other part of left foot with unspecified severity: Secondary | ICD-10-CM | POA: Diagnosis not present

## 2016-11-20 DIAGNOSIS — R0600 Dyspnea, unspecified: Secondary | ICD-10-CM | POA: Diagnosis not present

## 2016-11-20 DIAGNOSIS — T814XXA Infection following a procedure, initial encounter: Secondary | ICD-10-CM | POA: Diagnosis not present

## 2016-11-20 DIAGNOSIS — Z7401 Bed confinement status: Secondary | ICD-10-CM | POA: Diagnosis not present

## 2016-11-20 DIAGNOSIS — I509 Heart failure, unspecified: Secondary | ICD-10-CM | POA: Diagnosis not present

## 2016-11-20 LAB — COMPREHENSIVE METABOLIC PANEL
ALBUMIN: 2.6 g/dL — AB (ref 3.5–5.0)
ALK PHOS: 88 U/L (ref 38–126)
ALT: 24 U/L (ref 14–54)
ANION GAP: 8 (ref 5–15)
AST: 25 U/L (ref 15–41)
BUN: 24 mg/dL — ABNORMAL HIGH (ref 6–20)
CALCIUM: 8.8 mg/dL — AB (ref 8.9–10.3)
CO2: 27 mmol/L (ref 22–32)
Chloride: 105 mmol/L (ref 101–111)
Creatinine, Ser: 1.06 mg/dL — ABNORMAL HIGH (ref 0.44–1.00)
GFR calc non Af Amer: 48 mL/min — ABNORMAL LOW (ref >60)
GFR, EST AFRICAN AMERICAN: 56 mL/min — AB (ref >60)
GLUCOSE: 257 mg/dL — AB (ref 65–99)
POTASSIUM: 5.2 mmol/L — AB (ref 3.5–5.1)
SODIUM: 140 mmol/L (ref 135–145)
Total Bilirubin: 0.3 mg/dL (ref 0.3–1.2)
Total Protein: 8.6 g/dL — ABNORMAL HIGH (ref 6.5–8.1)

## 2016-11-20 LAB — CBC WITH DIFFERENTIAL/PLATELET
BASOS ABS: 0.1 10*3/uL (ref 0–0.1)
Basophils Relative: 1 %
EOS ABS: 0.4 10*3/uL (ref 0–0.7)
EOS PCT: 5 %
HCT: 31.2 % — ABNORMAL LOW (ref 35.0–47.0)
HEMOGLOBIN: 10.3 g/dL — AB (ref 12.0–16.0)
LYMPHS ABS: 1.1 10*3/uL (ref 1.0–3.6)
LYMPHS PCT: 15 %
MCH: 29.7 pg (ref 26.0–34.0)
MCHC: 33 g/dL (ref 32.0–36.0)
MCV: 90 fL (ref 80.0–100.0)
Monocytes Absolute: 0.2 10*3/uL (ref 0.2–0.9)
Monocytes Relative: 3 %
NEUTROS PCT: 76 %
Neutro Abs: 5.6 10*3/uL (ref 1.4–6.5)
PLATELETS: 353 10*3/uL (ref 150–440)
RBC: 3.47 MIL/uL — AB (ref 3.80–5.20)
RDW: 16.1 % — ABNORMAL HIGH (ref 11.5–14.5)
WBC: 7.3 10*3/uL (ref 3.6–11.0)

## 2016-11-20 LAB — URINALYSIS COMPLETE WITH MICROSCOPIC (ARMC ONLY)
BILIRUBIN URINE: NEGATIVE
GLUCOSE, UA: 50 mg/dL — AB
Hgb urine dipstick: NEGATIVE
KETONES UR: NEGATIVE mg/dL
Leukocytes, UA: NEGATIVE
NITRITE: NEGATIVE
Protein, ur: 100 mg/dL — AB
SPECIFIC GRAVITY, URINE: 1.018 (ref 1.005–1.030)
pH: 5 (ref 5.0–8.0)

## 2016-11-20 LAB — TROPONIN I

## 2016-11-20 LAB — BLOOD GAS, VENOUS
Acid-Base Excess: 1 mmol/L (ref 0.0–2.0)
Bicarbonate: 28.9 mmol/L — ABNORMAL HIGH (ref 20.0–28.0)
DELIVERY SYSTEMS: POSITIVE
FIO2: 0.3
O2 Saturation: 57.7 %
PATIENT TEMPERATURE: 37
PCO2 VEN: 63 mmHg — AB (ref 44.0–60.0)
PH VEN: 7.27 (ref 7.250–7.430)
PO2 VEN: 35 mmHg (ref 32.0–45.0)

## 2016-11-20 LAB — GLUCOSE, CAPILLARY
Glucose-Capillary: 184 mg/dL — ABNORMAL HIGH (ref 65–99)
Glucose-Capillary: 238 mg/dL — ABNORMAL HIGH (ref 65–99)

## 2016-11-20 LAB — APTT: APTT: 27 s (ref 24–36)

## 2016-11-20 LAB — LACTIC ACID, PLASMA: LACTIC ACID, VENOUS: 1.8 mmol/L (ref 0.5–1.9)

## 2016-11-20 LAB — PROTIME-INR
INR: 1.08
Prothrombin Time: 14 seconds (ref 11.4–15.2)

## 2016-11-20 LAB — LIPASE, BLOOD: Lipase: 24 U/L (ref 11–51)

## 2016-11-20 MED ORDER — SODIUM CHLORIDE 0.9 % IV BOLUS (SEPSIS)
1000.0000 mL | Freq: Once | INTRAVENOUS | Status: AC
Start: 2016-11-20 — End: 2016-11-21
  Administered 2016-11-20: 1000 mL via INTRAVENOUS

## 2016-11-20 MED ORDER — METHYLPREDNISOLONE SODIUM SUCC 125 MG IJ SOLR
125.0000 mg | Freq: Once | INTRAMUSCULAR | Status: AC
  Administered 2016-11-21: 125 mg via INTRAVENOUS
  Filled 2016-11-20: qty 2

## 2016-11-20 MED ORDER — ALBUTEROL SULFATE (2.5 MG/3ML) 0.083% IN NEBU
5.0000 mg | INHALATION_SOLUTION | Freq: Once | RESPIRATORY_TRACT | Status: AC
  Administered 2016-11-21: 5 mg via RESPIRATORY_TRACT
  Filled 2016-11-20: qty 6

## 2016-11-20 MED ORDER — PIPERACILLIN-TAZOBACTAM 3.375 G IVPB 30 MIN
3.3750 g | Freq: Once | INTRAVENOUS | Status: AC
  Administered 2016-11-20: 3.375 g via INTRAVENOUS
  Filled 2016-11-20: qty 50

## 2016-11-20 MED ORDER — IPRATROPIUM-ALBUTEROL 0.5-2.5 (3) MG/3ML IN SOLN
3.0000 mL | Freq: Once | RESPIRATORY_TRACT | Status: AC
  Administered 2016-11-21: 3 mL via RESPIRATORY_TRACT
  Filled 2016-11-20: qty 3

## 2016-11-20 MED ORDER — VANCOMYCIN HCL IN DEXTROSE 1-5 GM/200ML-% IV SOLN
1000.0000 mg | Freq: Once | INTRAVENOUS | Status: AC
  Administered 2016-11-20: 1000 mg via INTRAVENOUS
  Filled 2016-11-20: qty 200

## 2016-11-20 NOTE — ED Notes (Signed)
RN attempted to Flush Picc Line. Unable to flush. Changed cap and retried. After changing the cap the picc line flushed without any difficulty.

## 2016-11-20 NOTE — ED Triage Notes (Signed)
Patient presents to Emergency Department via EMS with complaints of respiratory distress with possible sepsis causing ulcer to left foot.  Pt from [facility] found b y EMS with 50% pulse ox and CO2 28.  Pt able to speak name and birth day/

## 2016-11-20 NOTE — ED Provider Notes (Signed)
[facility] Emergency Department Provider Note  Time seen: 7:48 AM  I have reviewed the triage vital signs and the nursing notes.   HISTORY  Chief Complaint Vascular Access Problem    HPI [name] [name] is a 82 y.o. female With a past medical history of diabetes, hypertension, CVA who presents from her nursing facility for a clogged PICC line. According to EMS report patient's PICC line was noted to be clogged this morning at shift change. [name] currently on IV antibiotics for bacteremia. Patient has no complaints at this time. [name] an extremely poor historian, and cannot contribute to her current history.  Past Medical History:  Diagnosis Date  . Diabetes mellitus without complication (HCC)   . Hypertension   . Stroke [facility](HCC)     Patient Active Problem List   Diagnosis Date Noted  . 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 Acute respiratory failure with hypoxia (HCC) 09/15/2016  . Carpal tunnel syndrome 09/01/2016  . Hypertension 09/01/2016  . PVD (peripheral vascular disease) (HCC) 09/01/2016  . Open abdominal wall wound 09/01/2016  . Abscess 08/21/2016  . Type 2 diabetes mellitus with complication (HCC) 05/20/2015  . Visual disturbance 05/20/2015  . H/O: CVA (cerebrovascular accident) 01/21/2015  . Difficulty in walking 08/27/2014  . Diabetes (HCC) 08/20/2014  . Diabetic neuropathy (HCC) 08/20/2014  . Dizzy spells 08/20/2014  . Hand numbness 08/20/2014  . Obesity 08/20/2014    Past Surgical History:  Procedure Laterality Date  . PERIPHERAL VASCULAR CATHETERIZATION N/A 11/14/2016   Procedure: Lower Extremity Angiography;  Surgeon: Annice NeedyJason S Dew, MD;  Location: [facility] INVASIVE CV LAB;  Service: Cardiovascular;  Laterality: N/A;    Prior to Admission medications   Medication Sig Start Date End Date Taking? Authorizing Provider  ampicillin 2 g in sodium chloride 0.9 % 50 mL Inject 2 g into the vein every 6  (six) hours. 11/15/16 11/23/16  Enedina FinnerSona Patel, MD  aspirin EC 81 MG tablet Take 1 tablet by mouth 1 day or 1 dose.    Historical Provider, MD  atorvastatin (LIPITOR) 10 MG tablet Take 1 tablet by mouth at bedtime.  03/26/16   Historical Provider, MD  carvedilol (COREG) 3.125 MG tablet Take 1 tablet (3.125 mg total) by mouth 2 (two) times daily with a meal. 09/17/16   Katha HammingSnehalatha Konidena, MD  clopidogrel (PLAVIX) 75 MG tablet Take 1 tablet (75 mg total) by mouth daily. 11/16/16   Enedina FinnerSona Patel, MD  collagenase (SANTYL) ointment Apply topically daily. 11/15/16   Enedina FinnerSona Patel, MD  diphenhydrAMINE (BENADRYL) 25 mg capsule Take 25 mg by mouth every 6 (six) hours as needed for itching.    Historical Provider, MD  feeding supplement, GLUCERNA SHAKE, (GLUCERNA SHAKE) LIQD Take 237 mLs by mouth 3 (three) times daily between meals. 11/15/16   Enedina FinnerSona Patel, MD  furosemide (LASIX) 20 MG tablet Take 1 tablet (20 mg total) by mouth daily. 09/18/16   Katha HammingSnehalatha Konidena, MD  gabapentin (NEURONTIN) 100 MG capsule Take 2 capsules by mouth 2 (two) times daily. Take 2 capsules (200mg ) twice a day 02/23/16   Historical Provider, MD  insulin aspart protamine- aspart (NOVOLOG MIX 70/30) (70-30) 100 UNIT/ML injection Inject 0.38 mLs (38 Units total) into the skin daily with breakfast. Patient taking differently: Inject 42 Units into the skin daily with breakfast.  08/26/16   Enedina FinnerSona Patel, MD  insulin aspart protamine- aspart (NOVOLOG MIX 70/30) (70-30) 100 UNIT/ML injection Inject 0.15 mLs (15 Units total) into  the skin daily with supper. Patient taking differently: Inject 17 Units into the skin daily with supper.  09/17/16   Katha HammingSnehalatha Konidena, MD  lisinopril (PRINIVIL,ZESTRIL) 2.5 MG tablet Take 1 tablet (2.5 mg total) by mouth daily. 09/18/16   Katha HammingSnehalatha Konidena, MD  loratadine (CLARITIN) 10 MG tablet Take 10 mg by mouth daily.    Historical Provider, MD  Vitamin D, Ergocalciferol, (DRISDOL) 50000 units CAPS capsule Take 1 capsule by mouth  once a week. 03/26/16   Historical Provider, MD    No Known Allergies  No family history on file.  Social History Social History  Substance Use Topics  . Smoking status: Former Smoker    Types: Cigarettes  . Smokeless tobacco: Never Used  . Alcohol use No    Review of Systems Constitutional: Negative for fever. Cardiovascular: Negative for chest pain. Respiratory: Negative for shortness of breath. Gastrointestinal: Negative for abdominal pain Skin: Negative for rash. Neurological: Negative for headache 10-point ROS otherwise negative, however limited by the patient's baseline memory deficits.  ____________________________________________   PHYSICAL EXAM:  Constitutional: Alert. Well appearing and in no distress.no complaints at this time. Eyes: Normal exam ENT   Head: Normocephalic and atraumatic Cardiovascular: Normal rate, regular rhythm.  Respiratory: Normal respiratory effort without tachypnea nor retractions. Breath sounds are clear  Gastrointestinal: Soft and nontender.  Musculoskeletal: right upper extremity PICC line Neurologic:  Normal speech and language. Skin:  Skin is warm, dry. No rash. Psychiatric: Mood and affect are normal.   ____________________________________________    INITIAL IMPRESSION / ASSESSMENT AND PLAN / ED COURSE  Pertinent labs & imaging results that were available during my care of the patient were reviewed by me and considered in my medical decision making (see chart for details).  the patient presents the emergency department with a clogged right upper extremity PICC line. Overall the patient appears well, has no complaints. Normal physical exam. Patient has a right upper extremity PICC line which would not flush. The nurse was able to exchange the PICC line cap, and the line now flushes freely. The line has been flushed. The patient remains well appearing. We will discharge back to her nursing  facility.  ____________________________________________   FINAL CLINICAL IMPRESSION(S) / ED DIAGNOSES  obstructed PICC line    Minna AntisKevin Jasmynn Pfalzgraf, MD 11/20/16 [phone]

## 2016-11-20 NOTE — ED Triage Notes (Signed)
[facility] from [facility] with reports of clogged PICC line. Cap changed and flushed in ED without difficulty.

## 2016-11-20 NOTE — ED Provider Notes (Signed)
[facility] Emergency Department Provider Note  ____________________________________________  Time seen: Approximately 9:52 PM  I have reviewed the triage vital signs and the nursing notes.   HISTORY  Chief Complaint Shortness of breath  Level 5 caveat:  Portions of the history and physical were unable to be obtained due to the patient's acute illness    HPI [name] [name] is a 82 y.o. female sent to the ED for rehabilitation due to altered mental status and somnolence and respiratory distress. EMS report that on arrival her oxygen saturation was 50%. The patient on BiPAP with improvement into the mid 90s. Patient was seen in the ED earlier today for PICC line, complication     Past Medical History:  Diagnosis Date  . Diabetes mellitus without complication (HCC)   . Hypertension   . Stroke [facility](HCC)      Patient Active Problem List   Diagnosis Date Noted  . 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 Acute respiratory failure with hypoxia (HCC) 09/15/2016  . Carpal tunnel syndrome 09/01/2016  . Hypertension 09/01/2016  . PVD (peripheral vascular disease) (HCC) 09/01/2016  . Open abdominal wall wound 09/01/2016  . Abscess 08/21/2016  . Type 2 diabetes mellitus with complication (HCC) 05/20/2015  . Visual disturbance 05/20/2015  . H/O: CVA (cerebrovascular accident) 01/21/2015  . Difficulty in walking 08/27/2014  . Diabetes (HCC) 08/20/2014  . Diabetic neuropathy (HCC) 08/20/2014  . Dizzy spells 08/20/2014  . Hand numbness 08/20/2014  . Obesity 08/20/2014     Past Surgical History:  Procedure Laterality Date  . PERIPHERAL VASCULAR CATHETERIZATION N/A 11/14/2016   Procedure: Lower Extremity Angiography;  Surgeon: Annice NeedyJason S Dew, MD;  Location: [facility] INVASIVE CV LAB;  Service: Cardiovascular;  Laterality: N/A;     Prior to Admission medications   Medication Sig Start Date End Date Taking? Authorizing Provider   ampicillin 2 g in sodium chloride 0.9 % 50 mL Inject 2 g into the vein every 6 (six) hours. 11/15/16 11/23/16 Yes Enedina FinnerSona Patel, MD  aspirin EC 81 MG tablet Take 1 tablet by mouth 1 day or 1 dose.   Yes Historical Provider, MD  atorvastatin (LIPITOR) 10 MG tablet Take 1 tablet by mouth at bedtime.  03/26/16  Yes Historical Provider, MD  carvedilol (COREG) 3.125 MG tablet Take 1 tablet (3.125 mg total) by mouth 2 (two) times daily with a meal. 09/17/16  Yes Katha HammingSnehalatha Konidena, MD  clopidogrel (PLAVIX) 75 MG tablet Take 1 tablet (75 mg total) by mouth daily. 11/16/16  Yes Enedina FinnerSona Patel, MD  collagenase (SANTYL) ointment Apply topically daily. 11/15/16  Yes Enedina FinnerSona Patel, MD  diphenhydrAMINE (BENADRYL) 25 mg capsule Take 25 mg by mouth every 6 (six) hours as needed for itching.   Yes Historical Provider, MD  furosemide (LASIX) 20 MG tablet Take 1 tablet (20 mg total) by mouth daily. 09/18/16  Yes Katha HammingSnehalatha Konidena, MD  gabapentin (NEURONTIN) 100 MG capsule Take 2 capsules by mouth 2 (two) times daily. Take 2 capsules (200mg ) twice a day 02/23/16  Yes Historical Provider, MD  insulin aspart protamine- aspart (NOVOLOG MIX 70/30) (70-30) 100 UNIT/ML injection Inject 0.38 mLs (38 Units total) into the skin daily with breakfast. 08/26/16  Yes Enedina FinnerSona Patel, MD  insulin aspart protamine- aspart (NOVOLOG MIX 70/30) (70-30) 100 UNIT/ML injection Inject 0.15 mLs (15 Units total) into the skin daily with supper. 09/17/16  Yes Katha HammingSnehalatha Konidena, MD  lisinopril (PRINIVIL,ZESTRIL) 2.5 MG tablet Take 1 tablet (2.5 mg total) by  mouth daily. 09/18/16  Yes Katha Hamming, MD  loratadine (CLARITIN) 10 MG tablet Take 10 mg by mouth daily.   Yes Historical Provider, MD  potassium chloride SA (K-DUR,KLOR-CON) 20 MEQ tablet Take 20 mEq by mouth daily.   Yes Historical Provider, MD  Vitamin D, Ergocalciferol, (DRISDOL) 50000 units CAPS capsule Take 1 capsule by mouth once a week. 03/26/16  Yes Historical Provider, MD      Allergies Patient has no known allergies.   History reviewed. No pertinent family history.  Social History Social History  Substance Use Topics  . Smoking status: Former Smoker    Types: Cigarettes  . Smokeless tobacco: Never Used  . Alcohol use No    Review of Systems Unable to obtain due to altered mental status  ____________________________________________   PHYSICAL EXAM:  VITAL SIGNS: ED Triage Vitals  Enc Vitals Group     BP      Pulse      Resp      Temp      Temp src      SpO2      Weight      Height      Head Circumference      Peak Flow      Pain Score      Pain Loc      Pain Edu?      Excl. in GC?     Vital signs reviewed, nursing assessments reviewed.   Constitutional:   Awake and alert.. Moderate respiratory distress. Eyes:   No scleral icterus. No conjunctival pallor. PERRL. EOMI.  No nystagmus. ENT   Head:   Normocephalic and atraumatic.   Nose:   No congestion/rhinnorhea. No septal hematoma   Mouth/Throat:   Dry mucous membranes, no pharyngeal erythema. No peritonsillar mass.    Neck:   No stridor. No SubQ emphysema. No meningismus. Hematological/Lymphatic/Immunilogical:   No cervical lymphadenopathy. Cardiovascular:   RRR. Symmetric bilateral radial and DP pulses.  No murmurs.  Respiratory:   Diffuse expiratory wheezing. Poor air entry in all lung fields. Symmetric. Gastrointestinal:   Soft and nontender. Non distended. There is no CVA tenderness.  No rebound, rigidity, or guarding. Genitourinary:   deferred Musculoskeletal:   Tenderness in the left lower leg about the ankle and heel with associated swelling erythema and a necrotic wound on the heel of the left leg. Neurologic:    CN 2-10 normal. Motor grossly intact. No gross focal neurologic deficits are appreciated.  Skin:    Skin is warm, dry and intact except for necrotic wound on the left heel. Poor skin turgor. Erythema about the left ankle.Marland Kitchen  No petechiae,  purpura, or bullae.  ____________________________________________    LABS (pertinent positives/negatives) (all labs ordered are listed, but only abnormal results are displayed) Labs Reviewed  COMPREHENSIVE METABOLIC PANEL - Abnormal; Notable for the following:       Result Value   Potassium 5.2 (*)    Glucose, Bld 257 (*)    BUN 24 (*)    Creatinine, Ser 1.06 (*)    Calcium 8.8 (*)    Total Protein 8.6 (*)    Albumin 2.6 (*)    GFR calc non Af Amer 48 (*)    GFR calc Af Amer 56 (*)    All other components within normal limits  CBC WITH DIFFERENTIAL/PLATELET - Abnormal; Notable for the following:    RBC 3.47 (*)    Hemoglobin 10.3 (*)    HCT 31.2 (*)  RDW 16.1 (*)    All other components within normal limits  URINALYSIS COMPLETEWITH MICROSCOPIC ([facility] ONLY) - Abnormal; Notable for the following:    Color, Urine YELLOW (*)    APPearance HAZY (*)    Glucose, UA 50 (*)    Protein, ur 100 (*)    Bacteria, UA RARE (*)    Squamous Epithelial / LPF 0-5 (*)    All other components within normal limits  BLOOD GAS, VENOUS - Abnormal; Notable for the following:    pCO2, Ven 63 (*)    Bicarbonate 28.9 (*)    All other components within normal limits  GLUCOSE, CAPILLARY - Abnormal; Notable for the following:    Glucose-Capillary 238 (*)    All other components within normal limits  CULTURE, BLOOD (ROUTINE X 2)  CULTURE, BLOOD (ROUTINE X 2)  CULTURE, EXPECTORATED SPUTUM-ASSESSMENT  URINE CULTURE  LACTIC ACID, PLASMA  LIPASE, BLOOD  TROPONIN I  APTT  PROTIME-INR  LACTIC ACID, PLASMA   ____________________________________________   EKG  Interpreted by me Sinus rhythm rate of 90, normal axis and intervals. Poor R-wave progression in anterior precordial leads. Left bundle branch block. No acute ischemic changes.  ____________________________________________    RADIOLOGY  Chest x-ray reveals diffuse interstitial thickening, likely mild CHF. No pneumothorax. X-ray left  foot unremarkable. No evidence of cellulitis or subcutaneous gas  ____________________________________________   PROCEDURES Procedures CRITICAL CARE Performed by: Scotty CourtSTAFFORD, Damario Gillie   Total critical care time: 35 minutes  Critical care time was exclusive of separately billable procedures and treating other patients.  Critical care was necessary to treat or prevent imminent or life-threatening deterioration.  Critical care was time spent personally by me on the following activities: development of treatment plan with patient and/or surrogate as well as nursing, discussions with consultants, evaluation of patient's response to treatment, examination of patient, obtaining history from patient or surrogate, ordering and performing treatments and interventions, ordering and review of laboratory studies, ordering and review of radiographic studies, pulse oximetry and re-evaluation of patient's condition.  ____________________________________________   INITIAL IMPRESSION / ASSESSMENT AND PLAN / ED COURSE  Pertinent labs & imaging results that were available during my care of the patient were reviewed by me and considered in my medical decision making (see chart for details).  Patient presents with respiratory distress, oxygenation improved with BiPAP. Labs reveal hypercapnia indicative of respiratory failure with hypoxia and hypercapnia. Continue BiPAP. Nebs. The remainder of the workup actually is fairly unremarkable. Has severe COPD exacerbation.    ----------------------------------------- 11:43 PM on 11/20/2016 -----------------------------------------  Case discussed with critical care for admission.     Clinical Course    ____________________________________________   FINAL CLINICAL IMPRESSION(S) / ED DIAGNOSES  Final diagnoses:  Cellulitis of left lower extremity  Infected wound  Acute respiratory failure with hypoxia and hypercapnia (HCC)       Portions of this  note were generated with dragon dictation software. Dictation errors may occur despite best attempts at proofreading.    Sharman CheekPhillip Maddi Collar, MD 11/20/16 [phone]

## 2016-11-20 NOTE — Discharge Instructions (Signed)
Please follow-up with your primary care doctor as needed. Please contact her physician, or return to the emergency department for any further PICC line issues.

## 2016-11-21 ENCOUNTER — Other Ambulatory Visit: Payer: Commercial Managed Care - HMO

## 2016-11-21 ENCOUNTER — Encounter
Admission: EM | Disposition: A | Discharge status: Skilled Nursing Facility | Payer: Self-pay | Source: Home / Self Care | Attending: Internal Medicine

## 2016-11-21 ENCOUNTER — Encounter: Payer: Self-pay | Admitting: *Deleted

## 2016-11-21 ENCOUNTER — Other Ambulatory Visit: Payer: Self-pay | Admitting: *Deleted

## 2016-11-21 ENCOUNTER — Inpatient Hospital Stay: Payer: Commercial Managed Care - HMO

## 2016-11-21 ENCOUNTER — Ambulatory Visit
Admission: RE | Admit: 2016-11-21 | Payer: Commercial Managed Care - HMO | Source: Ambulatory Visit | Admitting: Vascular Surgery

## 2016-11-21 ENCOUNTER — Other Ambulatory Visit: Payer: Self-pay

## 2016-11-21 DIAGNOSIS — R0603 Acute respiratory distress: Secondary | ICD-10-CM

## 2016-11-21 DIAGNOSIS — J810 Acute pulmonary edema: Secondary | ICD-10-CM

## 2016-11-21 DIAGNOSIS — B952 Enterococcus as the cause of diseases classified elsewhere: Secondary | ICD-10-CM

## 2016-11-21 DIAGNOSIS — I70213 Atherosclerosis of native arteries of extremities with intermittent claudication, bilateral legs: Secondary | ICD-10-CM

## 2016-11-21 DIAGNOSIS — E119 Type 2 diabetes mellitus without complications: Secondary | ICD-10-CM

## 2016-11-21 HISTORY — PX: PERIPHERAL VASCULAR CATHETERIZATION: SHX172C

## 2016-11-21 LAB — CBC
HCT: 28 % — ABNORMAL LOW (ref 35.0–47.0)
Hemoglobin: 9.2 g/dL — ABNORMAL LOW (ref 12.0–16.0)
MCH: 29.4 pg (ref 26.0–34.0)
MCHC: 32.9 g/dL (ref 32.0–36.0)
MCV: 89.5 fL (ref 80.0–100.0)
PLATELETS: 326 10*3/uL (ref 150–440)
RBC: 3.13 MIL/uL — ABNORMAL LOW (ref 3.80–5.20)
RDW: 15.9 % — AB (ref 11.5–14.5)
WBC: 13.2 10*3/uL — ABNORMAL HIGH (ref 3.6–11.0)

## 2016-11-21 LAB — BUN: BUN: 25 mg/dL — ABNORMAL HIGH (ref 6–20)

## 2016-11-21 LAB — BLOOD GAS, ARTERIAL
Acid-Base Excess: 1.1 mmol/L (ref 0.0–2.0)
Bicarbonate: 27.1 mmol/L (ref 20.0–28.0)
Delivery systems: POSITIVE
EXPIRATORY PAP: 5
FIO2: 0.3
INSPIRATORY PAP: 10
Mechanical Rate: 8
O2 Saturation: 96.9 %
PCO2 ART: 49 mmHg — AB (ref 32.0–48.0)
PH ART: 7.35 (ref 7.350–7.450)
PO2 ART: 94 mmHg (ref 83.0–108.0)
Patient temperature: 37

## 2016-11-21 LAB — GLUCOSE, CAPILLARY
GLUCOSE-CAPILLARY: 265 mg/dL — AB (ref 65–99)
GLUCOSE-CAPILLARY: 210 mg/dL — AB (ref 65–99)
GLUCOSE-CAPILLARY: 280 mg/dL — AB (ref 65–99)
Glucose-Capillary: 239 mg/dL — ABNORMAL HIGH (ref 65–99)
Glucose-Capillary: 299 mg/dL — ABNORMAL HIGH (ref 65–99)
Glucose-Capillary: 291 mg/dL — ABNORMAL HIGH (ref 65–99)

## 2016-11-21 LAB — BASIC METABOLIC PANEL
Anion gap: 7 (ref 5–15)
BUN: 26 mg/dL — AB (ref 6–20)
CALCIUM: 8.2 mg/dL — AB (ref 8.9–10.3)
CO2: 27 mmol/L (ref 22–32)
Chloride: 105 mmol/L (ref 101–111)
Creatinine, Ser: 1.17 mg/dL — ABNORMAL HIGH (ref 0.44–1.00)
GFR calc Af Amer: 50 mL/min — ABNORMAL LOW (ref >60)
GFR, EST NON AFRICAN AMERICAN: 43 mL/min — AB (ref >60)
GLUCOSE: 266 mg/dL — AB (ref 65–99)
POTASSIUM: 4.9 mmol/L (ref 3.5–5.1)
Sodium: 139 mmol/L (ref 135–145)

## 2016-11-21 LAB — CREATININE, SERUM
Creatinine, Ser: 1.06 mg/dL — ABNORMAL HIGH (ref 0.44–1.00)
GFR calc Af Amer: 56 mL/min — ABNORMAL LOW (ref >60)
GFR calc non Af Amer: 48 mL/min — ABNORMAL LOW (ref >60)

## 2016-11-21 LAB — PHOSPHORUS: Phosphorus: 3.6 mg/dL (ref 2.5–4.6)

## 2016-11-21 LAB — PROCALCITONIN: Procalcitonin: <0.1 ng/mL

## 2016-11-21 LAB — MAGNESIUM: Magnesium: 2.2 mg/dL (ref 1.7–2.4)

## 2016-11-21 LAB — MRSA PCR SCREENING: MRSA BY PCR: NEGATIVE

## 2016-11-21 SURGERY — LOWER EXTREMITY ANGIOGRAPHY
Anesthesia: Moderate Sedation | Site: Leg Lower | Laterality: Right

## 2016-11-21 MED ORDER — VANCOMYCIN HCL IN DEXTROSE 1-5 GM/200ML-% IV SOLN
1000.0000 mg | INTRAVENOUS | Status: DC
  Filled 2016-11-21 (×2): qty 200

## 2016-11-21 MED ORDER — SODIUM CHLORIDE 0.9 % IV SOLN: INTRAVENOUS | Status: DC

## 2016-11-21 MED ORDER — HEPARIN (PORCINE) IN NACL 2-0.9 UNIT/ML-% IJ SOLN
INTRAMUSCULAR | Status: AC
  Filled 2016-11-21: qty 1000

## 2016-11-21 MED ORDER — IPRATROPIUM-ALBUTEROL 0.5-2.5 (3) MG/3ML IN SOLN
3.0000 mL | Freq: Four times a day (QID) | RESPIRATORY_TRACT | Status: DC
  Administered 2016-11-21 – 2016-11-23 (×7): 3 mL via RESPIRATORY_TRACT
  Filled 2016-11-21 (×9): qty 3

## 2016-11-21 MED ORDER — ORAL CARE MOUTH RINSE
15.0000 mL | Freq: Two times a day (BID) | OROMUCOSAL | Status: DC
  Administered 2016-11-21 – 2016-11-23 (×2): 15 mL via OROMUCOSAL

## 2016-11-21 MED ORDER — ATORVASTATIN CALCIUM 20 MG PO TABS
10.0000 mg | ORAL_TABLET | Freq: Every day | ORAL | Status: DC
  Administered 2016-11-21 – 2016-11-22 (×2): 10 mg via ORAL
  Filled 2016-11-21 (×2): qty 1

## 2016-11-21 MED ORDER — METHYLPREDNISOLONE SODIUM SUCC 125 MG IJ SOLR: 125.0000 mg | INTRAMUSCULAR | Status: DC | PRN

## 2016-11-21 MED ORDER — IPRATROPIUM-ALBUTEROL 0.5-2.5 (3) MG/3ML IN SOLN
3.0000 mL | RESPIRATORY_TRACT | Status: DC | PRN
  Administered 2016-11-21: 3 mL via RESPIRATORY_TRACT
  Filled 2016-11-21: qty 3

## 2016-11-21 MED ORDER — HEPARIN SODIUM (PORCINE) 1000 UNIT/ML IJ SOLN
INTRAMUSCULAR | Status: DC | PRN
  Administered 2016-11-21: 5000 [IU] via INTRAVENOUS

## 2016-11-21 MED ORDER — DEXTROSE 5 % IV SOLN: 1.5000 g | INTRAVENOUS | Status: DC

## 2016-11-21 MED ORDER — INSULIN ASPART 100 UNIT/ML ~~LOC~~ SOLN
0.0000 [IU] | SUBCUTANEOUS | Status: DC
  Administered 2016-11-21: 8 [IU] via SUBCUTANEOUS
  Administered 2016-11-21: 5 [IU] via SUBCUTANEOUS
  Administered 2016-11-21 (×2): 8 [IU] via SUBCUTANEOUS
  Administered 2016-11-22: 2 [IU] via SUBCUTANEOUS
  Administered 2016-11-22 (×3): 5 [IU] via SUBCUTANEOUS
  Administered 2016-11-22: 3 [IU] via SUBCUTANEOUS
  Administered 2016-11-22: 8 [IU] via SUBCUTANEOUS
  Administered 2016-11-22: 3 [IU] via SUBCUTANEOUS
  Administered 2016-11-23: 5 [IU] via SUBCUTANEOUS
  Filled 2016-11-21: qty 8
  Filled 2016-11-21: qty 5
  Filled 2016-11-21: qty 8
  Filled 2016-11-21 (×2): qty 3
  Filled 2016-11-21: qty 5
  Filled 2016-11-21: qty 8
  Filled 2016-11-21: qty 3
  Filled 2016-11-21: qty 5
  Filled 2016-11-21: qty 8
  Filled 2016-11-21: qty 5
  Filled 2016-11-21: qty 8
  Filled 2016-11-21: qty 5

## 2016-11-21 MED ORDER — FENTANYL CITRATE (PF) 100 MCG/2ML IJ SOLN
INTRAMUSCULAR | Status: DC | PRN
  Administered 2016-11-21: 25 ug via INTRAVENOUS

## 2016-11-21 MED ORDER — FAMOTIDINE 20 MG PO TABS
40.0000 mg | ORAL_TABLET | ORAL | Status: DC | PRN
Start: 2016-11-21 — End: 2016-11-21

## 2016-11-21 MED ORDER — LORATADINE 10 MG PO TABS
10.0000 mg | ORAL_TABLET | Freq: Every day | ORAL | Status: DC
  Administered 2016-11-22 – 2016-11-23 (×2): 10 mg via ORAL
  Filled 2016-11-21 (×2): qty 1

## 2016-11-21 MED ORDER — LISINOPRIL 5 MG PO TABS
2.5000 mg | ORAL_TABLET | Freq: Every day | ORAL | Status: DC
  Administered 2016-11-22 – 2016-11-23 (×2): 2.5 mg via ORAL
  Filled 2016-11-21 (×2): qty 1

## 2016-11-21 MED ORDER — ONDANSETRON HCL 4 MG/2ML IJ SOLN: 4.0000 mg | Freq: Four times a day (QID) | INTRAMUSCULAR | Status: DC | PRN

## 2016-11-21 MED ORDER — SODIUM CHLORIDE 0.9% FLUSH
10.0000 mL | Freq: Two times a day (BID) | INTRAVENOUS | Status: DC
  Administered 2016-11-21 – 2016-11-23 (×4): 10 mL

## 2016-11-21 MED ORDER — HEPARIN SODIUM (PORCINE) 1000 UNIT/ML IJ SOLN
INTRAMUSCULAR | Status: AC
  Filled 2016-11-21: qty 1

## 2016-11-21 MED ORDER — FUROSEMIDE 10 MG/ML IJ SOLN
40.0000 mg | Freq: Every day | INTRAMUSCULAR | Status: DC
  Administered 2016-11-22 – 2016-11-23 (×2): 40 mg via INTRAVENOUS
  Filled 2016-11-21 (×2): qty 4

## 2016-11-21 MED ORDER — ASPIRIN EC 81 MG PO TBEC
81.0000 mg | DELAYED_RELEASE_TABLET | ORAL | Status: DC
  Administered 2016-11-22 – 2016-11-23 (×2): 81 mg via ORAL
  Filled 2016-11-21 (×2): qty 1

## 2016-11-21 MED ORDER — IOPAMIDOL (ISOVUE-300) INJECTION 61%
INTRAVENOUS | Status: DC | PRN
  Administered 2016-11-21: 40 mL via INTRA_ARTERIAL

## 2016-11-21 MED ORDER — MIDAZOLAM HCL 2 MG/2ML IJ SOLN
INTRAMUSCULAR | Status: AC
  Filled 2016-11-21: qty 4

## 2016-11-21 MED ORDER — CHLORHEXIDINE GLUCONATE 0.12 % MT SOLN
15.0000 mL | Freq: Two times a day (BID) | OROMUCOSAL | Status: DC
  Administered 2016-11-21 – 2016-11-23 (×4): 15 mL via OROMUCOSAL
  Filled 2016-11-21 (×3): qty 15

## 2016-11-21 MED ORDER — FUROSEMIDE 10 MG/ML IJ SOLN
40.0000 mg | Freq: Once | INTRAMUSCULAR | Status: AC
  Administered 2016-11-21: 40 mg via INTRAVENOUS

## 2016-11-21 MED ORDER — DEXTROSE 5 % IV SOLN
1.5000 g | Freq: Once | INTRAVENOUS | Status: AC
  Administered 2016-11-21: 1.5 g via INTRAVENOUS

## 2016-11-21 MED ORDER — SODIUM CHLORIDE 0.9% FLUSH: 10.0000 mL | INTRAVENOUS | Status: DC | PRN

## 2016-11-21 MED ORDER — PIPERACILLIN-TAZOBACTAM 4.5 G IVPB
4.5000 g | Freq: Three times a day (TID) | INTRAVENOUS | Status: DC
  Filled 2016-11-21 (×2): qty 100

## 2016-11-21 MED ORDER — MIDAZOLAM HCL 2 MG/2ML IJ SOLN
INTRAMUSCULAR | Status: DC | PRN
  Administered 2016-11-21: 1 mg via INTRAVENOUS

## 2016-11-21 MED ORDER — LIDOCAINE-EPINEPHRINE 1 %-1:100000 IJ SOLN
INTRAMUSCULAR | Status: AC
  Filled 2016-11-21: qty 1

## 2016-11-21 MED ORDER — CARVEDILOL 6.25 MG PO TABS
3.1250 mg | ORAL_TABLET | Freq: Two times a day (BID) | ORAL | Status: DC
  Administered 2016-11-21 – 2016-11-23 (×6): 3.125 mg via ORAL
  Filled 2016-11-21 (×6): qty 1

## 2016-11-21 MED ORDER — SODIUM CHLORIDE 0.9 % IV SOLN: 250.0000 mL | INTRAVENOUS | Status: DC | PRN

## 2016-11-21 MED ORDER — FUROSEMIDE 10 MG/ML IJ SOLN
INTRAMUSCULAR | Status: AC
  Filled 2016-11-21: qty 4

## 2016-11-21 MED ORDER — FENTANYL CITRATE (PF) 100 MCG/2ML IJ SOLN
INTRAMUSCULAR | Status: AC
  Filled 2016-11-21: qty 2

## 2016-11-21 MED ORDER — ACETAMINOPHEN 325 MG PO TABS: 650.0000 mg | ORAL_TABLET | ORAL | Status: DC | PRN

## 2016-11-21 MED ORDER — PIPERACILLIN-TAZOBACTAM 4.5 G IVPB
4.5000 g | Freq: Three times a day (TID) | INTRAVENOUS | Status: DC
  Administered 2016-11-21 – 2016-11-22 (×4): 4.5 g via INTRAVENOUS
  Filled 2016-11-21 (×9): qty 100

## 2016-11-21 MED ORDER — HYDROMORPHONE HCL 1 MG/ML IJ SOLN: 1.0000 mg | Freq: Once | INTRAMUSCULAR | Status: DC

## 2016-11-21 MED ORDER — CLOPIDOGREL BISULFATE 75 MG PO TABS
75.0000 mg | ORAL_TABLET | Freq: Every day | ORAL | Status: DC
  Administered 2016-11-22 – 2016-11-23 (×2): 75 mg via ORAL
  Filled 2016-11-21 (×2): qty 1

## 2016-11-21 MED ORDER — DIPHENHYDRAMINE HCL 25 MG PO CAPS: 25.0000 mg | ORAL_CAPSULE | Freq: Four times a day (QID) | ORAL | Status: DC | PRN

## 2016-11-21 MED ORDER — HEPARIN SODIUM (PORCINE) 5000 UNIT/ML IJ SOLN
5000.0000 [IU] | Freq: Three times a day (TID) | INTRAMUSCULAR | Status: DC
  Administered 2016-11-21 – 2016-11-23 (×8): 5000 [IU] via SUBCUTANEOUS
  Filled 2016-11-21 (×7): qty 1

## 2016-11-21 SURGICAL SUPPLY — 12 items
BALLN LUTONIX AV 7X60X75 (BALLOONS) ×5
BALLN LUTONIX DCB 4X80X130 (BALLOONS) ×5
BALLOON LUTONIX AV 7X60X75 (BALLOONS) IMPLANT
BALLOON LUTONIX DCB 4X80X130 (BALLOONS) IMPLANT
CATH RIM 65CM (CATHETERS) ×2 IMPLANT
CATH VERT 100CM (CATHETERS) ×2 IMPLANT
DEVICE PRESTO INFLATION (MISCELLANEOUS) ×2 IMPLANT
DEVICE STARCLOSE SE CLOSURE (Vascular Products) ×2 IMPLANT
GLIDEWIRE ADV .035X260CM (WIRE) ×2 IMPLANT
PACK ANGIOGRAPHY (CUSTOM PROCEDURE TRAY) ×5 IMPLANT
SHEATH ANL2 6FRX45 HC (SHEATH) ×2 IMPLANT
WIRE J 3MM .035X145CM (WIRE) ×2 IMPLANT

## 2016-11-21 NOTE — H&P (Addendum)
PULMONARY / CRITICAL CARE MEDICINE   Name: [name] [name] MRN: [id number] DOB: 01-21-1934    ADMISSION DATE:  11/20/2016   REFERRING MD:  Dr. Scotty CourtStafford  CHIEF COMPLAINT: Acute respiratory distress  HISTORY OF PRESENT ILLNESS:   This is an 82 year old female who presented to the ED via EMS with complaints of acute respiratory distress that started this evening. Patient was in the ED at this morning with a clogged PICC line. Her PICC line was declogged and she was discharged back to the nursing home. She was being treated at the nursing home with IV antibiotics and IV fluids for enterococcus faecalis bacteremia. This evening, patient became acutely short of breath hence EMS was called. Upon EMS arrival, patient was hypoxic, and some With an SPO2 of 50%. She was placed on BiPAP and transferred to the ED. Her SPO2 improved up to 90%. She is currently on BiPAP and reports mild improvement in dyspnea. Her chest x-ray shows pulmonary edema, labs show mild hypokalemia, lactic acid level is normal and her WBC is normal. She denies chest pain, palpitations, nausea, vomiting, diarrhea, headaches, but reports dyspnea which has improved compared to when she first got to the emergency room. Despite being on BiPAP, patient is still tachypneic with respiratory rates in the 30s, hence PCCM was called to admit.  PAST MEDICAL HISTORY :  She  has a past medical history of Diabetes mellitus without complication (HCC); Hypertension; and Stroke [facility](HCC).  PAST SURGICAL HISTORY: She  has a past surgical history that includes Cardiac catheterization (N/A, 11/14/2016).  No Known Allergies  No current facility-administered medications on file prior to encounter.    Current Outpatient Prescriptions on File Prior to Encounter  Medication Sig  . ampicillin 2 g in sodium chloride 0.9 % 50 mL Inject 2 g into the vein every 6 (six) hours.  Marland Kitchen. aspirin EC 81 MG tablet Take 1 tablet by mouth 1 day or 1 dose.  Marland Kitchen. atorvastatin  (LIPITOR) 10 MG tablet Take 1 tablet by mouth at bedtime.   . carvedilol (COREG) 3.125 MG tablet Take 1 tablet (3.125 mg total) by mouth 2 (two) times daily with a meal.  . clopidogrel (PLAVIX) 75 MG tablet Take 1 tablet (75 mg total) by mouth daily.  . collagenase (SANTYL) ointment Apply topically daily.  . diphenhydrAMINE (BENADRYL) 25 mg capsule Take 25 mg by mouth every 6 (six) hours as needed for itching.  . furosemide (LASIX) 20 MG tablet Take 1 tablet (20 mg total) by mouth daily.  Marland Kitchen. gabapentin (NEURONTIN) 100 MG capsule Take 2 capsules by mouth 2 (two) times daily. Take 2 capsules (200mg ) twice a day  . insulin aspart protamine- aspart (NOVOLOG MIX 70/30) (70-30) 100 UNIT/ML injection Inject 0.38 mLs (38 Units total) into the skin daily with breakfast.  . insulin aspart protamine- aspart (NOVOLOG MIX 70/30) (70-30) 100 UNIT/ML injection Inject 0.15 mLs (15 Units total) into the skin daily with supper.  Marland Kitchen. lisinopril (PRINIVIL,ZESTRIL) 2.5 MG tablet Take 1 tablet (2.5 mg total) by mouth daily.  Marland Kitchen. loratadine (CLARITIN) 10 MG tablet Take 10 mg by mouth daily.  . Vitamin D, Ergocalciferol, (DRISDOL) 50000 units CAPS capsule Take 1 capsule by mouth once a week.    FAMILY HISTORY:  Her has no family status information on file.    SOCIAL HISTORY: She  reports that she has quit smoking. Her smoking use included Cigarettes. She has never used smokeless tobacco. She reports that she does not drink alcohol or use drugs.  REVIEW OF SYSTEMS:   Constitutional: Negative for fever and chills.  HENT: Negative for congestion and rhinorrhea.  Eyes: Negative for redness and visual disturbance.  Respiratory: Positive for shortness of breath but negative for wheezing.  Cardiovascular: Negative for chest pain and palpitations.  Gastrointestinal: Negative  for nausea , vomiting and abdominal pain and  Loose stools Genitourinary: Negative for dysuria and urgency.  Endocrine: Denies polyuria, polyphagia  and heat intolerance Musculoskeletal: Negative for myalgias and arthralgias.  Skin: Negative for pallor and wound.  Neurological: Negative for dizziness and headaches   SUBJECTIVE:    VITAL SIGNS: BP (!) 123/56 (BP Location: Left Arm)   Pulse 77   Temp 97.2 F (36.2 C) (Rectal)   Resp 20   Ht 5\' 2"  (1.575 m)   Wt 177 lb (80.3 kg)   SpO2 100%   BMI 32.37 kg/m   HEMODYNAMICS:    VENTILATOR SETTINGS: FiO2 (%):  [30 %] 30 %  INTAKE / OUTPUT: No intake/output data recorded.  PHYSICAL EXAMINATION: General: Well-developed, appropriate for age, in moderate respiratory distress Neuro: Alert to person and place, speech is clear, able to move all extremities HEENT: Head is normocephalic and atraumatic, ear and nasal passages without any discharge, oral mucosa moist and pink, tongue protrudes at midline, trachea midline Cardiovascular: Apical pulse regular, S1, S2 audible, no murmur, regurg or gallop Lungs: Moderate increase in work of breathing, Bilateral airflow, significantly diminished in the bases, no wheezing, rhonchi, fine crackles in the bases Abdomen: Nondistended, normal bowel sounds in all 4 quadrants, no organomegaly Musculoskeletal: Normal range of motion in upper and lower extremities Extremities: +2 pulses in upper extremities, lower extremities with venous stasis discoloration, left heel ulcer with dressing intact, +1 pulses in lower extremities Skin:  Warm and dry,, lower extremities with venostasis discoloration, left greater than right, left foot ulcer with surrounding erythema  LABS:  BMET  Recent Labs Lab 11/14/16 0532 11/15/16 0516 11/20/16 2202  NA 136 136 140  K 5.2* 4.9 5.2*  CL 107 106 105  CO2 24 25 27   BUN 38* 35* 24*  CREATININE 1.34* 1.36* 1.06*  GLUCOSE 146* 211* 257*    Electrolytes  Recent Labs Lab 11/14/16 0532 11/15/16 0516 11/20/16 2202  CALCIUM 8.2* 8.1* 8.8*    CBC  Recent Labs Lab 11/20/16 2202  WBC 7.3  HGB 10.3*   HCT 31.2*  PLT 353    Coag's  Recent Labs Lab 11/20/16 2202  APTT 27  INR 1.08    Sepsis Markers  Recent Labs Lab 11/20/16 2202  LATICACIDVEN 1.8    ABG No results for input(s): PHART, PCO2ART, PO2ART in the last 168 hours.  Liver Enzymes  Recent Labs Lab 11/20/16 2202  AST 25  ALT 24  ALKPHOS 88  BILITOT 0.3  ALBUMIN 2.6*    Cardiac Enzymes  Recent Labs Lab 11/20/16 2202  TROPONINI <0.03    Glucose  Recent Labs Lab 11/14/16 2113 11/15/16 0807 11/15/16 1235 11/15/16 1622 11/20/16 0749 11/20/16 2209  GLUCAP 211* 200* 183* 181* 184* 238*    Imaging Dg Chest Port 1 View  Result Date: 11/20/2016 CLINICAL DATA:  Dyspnea.  Possible sepsis. EXAM: PORTABLE CHEST 1 VIEW COMPARISON:  11/14/2016 FINDINGS: Right upper extremity PICC line remain satisfactorily positioned. Mild vascular and interstitial congestive changes have developed. Central and basilar hazy airspace opacities have developed. Probable small pleural effusions, right greater than left. IMPRESSION: The findings most likely represent mild congestive heart failure. Cannot exclude infectious infiltrates. Electronically Signed  By: Ellery Plunkaniel R Mitchell M.D.   On: 11/20/2016 23:22   Dg Foot Complete Left  Result Date: 11/20/2016 CLINICAL DATA:  Possible sepsis.  Left foot ulcer. EXAM: LEFT FOOT - COMPLETE 3+ VIEW COMPARISON:  11/09/2016, 11/11/2016 FINDINGS: Negative for acute fracture or dislocation. No radiopaque foreign body. No bony destruction. No soft tissue gas. IMPRESSION: No acute findings. No radiographic evidence of osteomyelitis. No soft tissue gas. Electronically Signed   By: Ellery Plunkaniel R Mitchell M.D.   On: 11/20/2016 23:25    STUDIES:  2D echo done 11/13/2016 shows a left ventricular ejection fraction of 60%  CULTURES: Blood cultures 2 collected 11/20/2016 Urine culture 11/09/2016: Blood cultures positive for Enterococcus faecalis.  ANTIBIOTICS: Ampicillin 2 g IV every 6 hours.  Unclear when it was started. Zosyn started 11/20/2016  SIGNIFICANT EVENTS: 11/20/2016: Presented to the ED with dyspnea, found to be in acute respiratory failure with hypoxemia due to pulmonary edema. 11/20/2016: ED with clogged PICC line 11/14/2016: Peripheral vascular catheterization.  LINES/TUBES: PIVs Right PICC line  DISCUSSION: This is an 82 y/o female presenting with acute respiratory failure with hypoxemia due to pulmonary edema, possible COPD exacerbation, Enterococcus faecalis bacteremia, and lower extremity wound infection  ASSESSMENT / PLAN:  PULMONARY A: Acute COPD exacerbation-even though patient does not have a diagnosis of COPD, given her smoking history, it is possible that she might have undiagnosed COPD Acute respiratory failure with hypoxemia. Acute pulmonary edema P:   BiPAP as tolerated. ABG DuoNeb when necessary IV diuresis Chest x-ray in the morning  CARDIOVASCULAR A:  History of hypertension P:  Blood pressure was normal. Hemodynamic monitoring per ICU protocol  RENAL A:   AKI-baseline creatinine is 1.3, now 1.2 P:   Hold off IV fluids, light of pulmonary edema. Renally dose medications  GASTROINTESTINAL A:   No acute issues P:   Keep nothing by mouth with only sips of water and medications and resume normal diet in the morning Advance diet as tolerated when off BiPAP  HEMATOLOGIC A:   No acute issues P:  Monitor CBC and transfuse for hemoglobin less than 7  INFECTIOUS A:   Enterococcus faecalis bacteremia P:   Monitor fever curve Antibiotics as above  ENDOCRINE A:   Uncontrolled type 2 diabetes-blood sugars are still in the 200s  P:   Sliding-scale insulin with point of care glucose monitoring   NEUROLOGIC A:   History of CVA P:   RASS goal: Nonapplicable PT, OT and occupational therapy as tolerated   FAMILY  - Updates: We'll updated when available.  - Inter-disciplinary family meet or Palliative Care meeting  due by:  day 7      Magdalene S. [facility] ANP-BC Pulmonary and Critical Care Medicine [facility] Pager [phone] or [phone] 11/21/2016, 12:21 AM   Pt seen and examined, assessment and plan was formulated with the NP, agree with above except, as were amended. Patient was initially being treated for E faecalis sepsis, was presented to the emergency room with a clogged PICC line and sent back to ECF. Patient subsequently presented back to the hospital with acute respiratory distress, likely secondary to acute pulmonary edema and possibly mild COPD exacerbation. She was placed on BiPAP overnight with significant improvement today, lungs clear to auscultation. We'll switch to BiPAP daily at bedtime, and monitored during the day.  Wells Guiles-Deep Tarvis Blossom, M.D.   Discussed with Dr. Elisabeth PigeonVachhani for transfer.  11/21/2016  Critical Care Attestation.  I have personally obtained a history, examined the patient, evaluated laboratory and imaging  results, formulated the assessment and plan and placed orders. The Patient requires high complexity decision making for assessment and support, frequent evaluation and titration of therapies, application of advanced monitoring technologies and extensive interpretation of multiple databases. The patient has critical illness that could lead imminently to failure of 1 or more organ systems and requires the highest level of physician preparedness to intervene.  Critical Care Time devoted to patient care services described in this note is 45 minutes and is exclusive of time spent in procedures.

## 2016-11-21 NOTE — Progress Notes (Signed)
Pharmacy Antibiotic Note  [name] [name] is a 82 y.o. female admitted on 11/20/2016 with sepsis.  Pharmacy has been consulted for vancomycin and Zosyn dosing.  Plan: DW 62kg  Vd 43L kei 0.038 hr-1  T1/2 18 hours Vancomycin 1 gram q 24 hours ordered with stacked dosing. Level before 5th dose. Goal trough 15-20.   Zosyn 4.5 grams q 8 hours ordered for Pseudomonas risk of abx usage in past month.  Height: 5\' 2"  (157.5 cm) Weight: 177 lb (80.3 kg) IBW/kg (Calculated) : 50.1  Temp (24hrs), Avg:96.9 F (36.1 C), Min:94.5 F (34.7 C), Max:99.1 F (37.3 C)   Recent Labs Lab 11/14/16 0532 11/15/16 0516 11/20/16 2202  WBC  --   --  7.3  CREATININE 1.34* 1.36* 1.06*  LATICACIDVEN  --   --  1.8    Estimated Creatinine Clearance: 41.6 mL/min (by C-G formula based on SCr of 1.06 mg/dL (H)).    No Known Allergies  Antimicrobials this admission: Vancomycin  12/3 >>  Zosyn 12/3 >>   Dose adjustments this admission:   Microbiology results: 12/3 BCx: pending 12/3 UCx: pending  9/28 MRSA PCR: (-)    12/3 UA: (-) 12/3 CXR: infiltrate not excluded  Thank you for allowing pharmacy to be a part of this patient's care.  Arvine Clayburn S 11/21/2016 12:25 AM

## 2016-11-21 NOTE — ED Notes (Signed)
First call

## 2016-11-21 NOTE — Care Management Note (Signed)
Case Management Note  Patient Details  Name: [name] [name] MRN: [id number] Date of Birth: 1934-09-24  Subjective/Objective:  Admitted with acute resiratory distress and COPD exacerbation. Placed on Bipap but now on 2 L. [facility] CSW consult in. RNCM will assist as needed.                   Action/Plan:   Expected Discharge Date:                  Expected Discharge Plan:  (S) Skilled Nursing Facility Environmental consultant([facility])  In-House Referral:  Clinical Social Work  Discharge planning Services     Post Acute Care Choice:    Choice offered to:     DME Arranged:    DME Agency:     HH Arranged:    HH Agency:     Status of Service:  In process, will continue to follow  If discussed at Long Length of Stay Meetings, dates discussed:    Additional Comments:  Marily MemosLisa M Lional Icenogle, RN 11/21/2016, 2:29 PM

## 2016-11-21 NOTE — Progress Notes (Signed)
Dr.Ramchandran called, pt came with COPD- now stable for hospitalist care. We will resume care from tomorrow.

## 2016-11-21 NOTE — Op Note (Addendum)
[facility] Percutaneous Study/Intervention Procedural Note   Date of Surgery: 11/21/2016  Surgeon(s):DEW,JASON   Assistants:none  Pre-operative Diagnosis: PAD with ulceration bilateral lower extremities  Post-operative diagnosis: Same  Procedure(s) Performed: 1. Ultrasound guidance for vascular access left femoral artery 2. Catheter placement into right peroneal artery from left femoral approach 3. Aortogram and selective right lower extremity angiogram 4. Percutaneous transluminal angioplasty of right external iliac artery with 7 mm diameter by 6 cm length Lutonix drug-coated angioplasty balloon 5. Percutaneous transluminal angioplasty of the right tibioperoneal trunk with 4 mm diameter by 8 cm length Lutonix drug-coated angioplasty balloon  6. StarClose closure device left femoral artery  EBL: Minimal  Contrast: 40 cc  Fluoro Time: 3.7 minutes  Moderate Conscious Sedation Time: approximately 30 minutes using 1 mg of Versed and 25 mcg of Fentanyl  Indications: [name] a 82 y.o.female with ulcerations on both feet. The patient had LLE revascularization last week, and significant flow limitation around the sheath in the right femoral and external iliac artery with suspected. The patient is brought in for angiography for further evaluation and potential treatment of the right lower extremity. Risks and benefits are discussed and informed consent is obtained  Procedure: The patient was identified and appropriate procedural time out was performed. The patient was then placed supine on the table and prepped and draped in the usual sterile fashion.Moderate conscious sedation was administered during a face to face encounter with the patient throughout the procedure with my supervision of the RN administering medicines and monitoring the patient's vital signs, pulse oximetry,  telemetry and mental status throughout from the start of the procedure until the patient was taken to the recovery room. Ultrasound was used to evaluate the left common femoral artery. It was patent . A digital ultrasound image was acquired. A Seldinger needle was used to access the left common femoral artery under direct ultrasound guidance and a permanent image was performed. A 0.035 J wire was advanced without resistance and an Ansell sheath was placed. As we had already evaluated the renal arteries last week, I performed an image at the aortic bifurcation which showed the distal and mid aorta to be widely patent with brisk flow through the left iliac arteries. The right external iliac artery had a moderate stenosis in the 50-60% range with the appearance of a dissection. I then crossed the aortic bifurcation and advanced to the right femoral head. Selective right lower extremity angiogram was then performed. This demonstrated normal common femoral artery, profunda femoris artery, and superficial femoral artery. The popliteal artery had mild stenosis of 30-40% the did not appear flow limiting. The tibioperoneal trunk did have a moderate to high-grade stenosis in the 75-80% range with the peroneal artery being the only runoff distally. The patient was systemically heparinized and a 6 [place] Ansell sheath was then placed over the [employer] wire. I then used a Kumpe catheter and the advantage wire to carefully cross through the external iliac artery stenosis and dissection and remained in the true lumen. I treated the right external iliac artery with a 7 mm diameter by 6 cm length Lutonix drug-coated angioplasty balloon. This was inflated to 10 atm for 1 minute. Completion angiogram showed a much better residual lumen with only about a 20% residual narrowing and no extravasation. I then turned my attention to the distal runoff. Using the Kumpe catheter and the advantage wire was able to navigate through  the tibioperoneal trunk stenosis and parked the wire and  the distal peroneal artery. This was a reasonably generous vessel and I used a 4 mm diameter by 8 cm length Lutonix drug-coated angioplasty balloon. This was inflated to 12 atm for 1 minute. Completion and gram showed brisk flow distally with only about a 25-30% residual stenosis that was not flow limiting. I elected to terminate the procedure. The sheath was removed and StarClose closure device was deployed in the left femoral artery with excellent hemostatic result. The patient was taken to the recovery room in stable condition having tolerated the procedure well.  Findings:  Aortogram: Aorta was patent. Dissection in the right external iliac artery creating a 50-60% stenosis residual. Left iliac artery appeared patent without significant stenosis. Right Lower Extremity: Normal common femoral artery, profunda femoris artery, and superficial femoral artery. The popliteal artery had mild stenosis of 30-40% the did not appear flow limiting. The tibioperoneal trunk did have a moderate to high-grade stenosis in the 75-80% range with the peroneal artery being the only runoff distally.   Disposition: Patient was taken to the recovery room in stable condition having tolerated the procedure well.  Complications: None  Leotis Pain 11/21/2016 11:00 AM   This note was created with Dragon Medical transcription system. Any errors in dictation are purely unintentional.

## 2016-11-21 NOTE — Consult Note (Signed)
WOC Nurse wound consult note Reason for Consult: Unstageable pressure injury to left heel.  S/P revascularization last week (left side) and right side angiogram completed today. 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  Pressure Ulcer POA: Yes Measurement: 2 cm x 2 cm intact dry eschar. Wound bed:100% dry eschar Drainage (amount, consistency, odor) No drainage. Musty odor.  PeriwoundIntact Dressing procedure/placement/frequency:Vascular supply improved last week.  WOund is dry and intact.  Will not debride as this is acting as a protective covering at this time.  IF begins to drain or have fluctuance, erythema, would begin debridement.  Will not follow at this time.  Please re-consult if needed.  Maple HudsonKaren Amada Hallisey RN BSN [facility] Pager [phone]

## 2016-11-21 NOTE — Consult Note (Signed)
[facility] Vascular Consult Note  MRN : [id number]  Frederich Chickrnell Cassady is a 82 y.o. ([birth year]) female who presents with chief complaint of  Chief Complaint  Patient presents with  . Code Sepsis  .  History of Present Illness: [name] seen this morning as she was already on the schedule for right lower extremity angiogram as an outpatient. She was admitted for respiratory insufficiency yesterday and was on BiPAP last night, but is breathing comfortably on 2 L nasal cannula saturating 100% this morning. She does not appear to have much in the way of labored respirations. She is hemodynamically stable. She is a very poor historian. She has known arterial insufficiency of the right lower extremity from previous studies and is scheduled for an angiogram today.  Current Facility-Administered Medications  Medication Dose Route Frequency Provider Last Rate Last Dose  . 0.9 %  sodium chloride infusion  250 mL Intravenous PRN Lewie LoronMagadalene S Tukov, NP      . acetaminophen (TYLENOL) tablet 650 mg  650 mg Oral Q4H PRN Lewie LoronMagadalene S Tukov, NP      . aspirin EC tablet 81 mg  81 mg Oral 1 day or 1 dose Lewie LoronMagadalene S Tukov, NP      . atorvastatin (LIPITOR) tablet 10 mg  10 mg Oral QHS Lewie LoronMagadalene S Tukov, NP      . carvedilol (COREG) tablet 3.125 mg  3.125 mg Oral BID WC Lewie LoronMagadalene S Tukov, NP   3.125 mg at 11/21/16 0755  . chlorhexidine (PERIDEX) 0.12 % solution 15 mL  15 mL Mouth Rinse BID Erin FullingKurian Kasa, MD   15 mL at 11/21/16 0341  . clopidogrel (PLAVIX) tablet 75 mg  75 mg Oral Daily Lewie LoronMagadalene S Tukov, NP      . diphenhydrAMINE (BENADRYL) capsule 25 mg  25 mg Oral Q6H PRN Lewie LoronMagadalene S Tukov, NP      . furosemide (LASIX) injection 40 mg  40 mg Intravenous Daily Lewie LoronMagadalene S Tukov, NP      . heparin injection 5,000 Units  5,000 Units Subcutaneous Q8H Lewie LoronMagadalene S Tukov, NP   5,000 Units at 11/21/16 0545  . insulin aspart (novoLOG) injection 0-15 Units  0-15 Units Subcutaneous Q4H  Lewie LoronMagadalene S Tukov, NP   5 Units at 11/21/16 0754  . ipratropium-albuterol (DUONEB) 0.5-2.5 (3) MG/3ML nebulizer solution 3 mL  3 mL Nebulization Q6H Magadalene S Tukov, NP      . lisinopril (PRINIVIL,ZESTRIL) tablet 2.5 mg  2.5 mg Oral Daily Magadalene Earlie LouS Tukov, NP      . loratadine (CLARITIN) tablet 10 mg  10 mg Oral Daily Lewie LoronMagadalene S Tukov, NP      . MEDLINE mouth rinse  15 mL Mouth Rinse q12n4p Erin FullingKurian Kasa, MD      . ondansetron (ZOFRAN) injection 4 mg  4 mg Intravenous Q6H PRN Lewie LoronMagadalene S Tukov, NP      . piperacillin-tazobactam (ZOSYN) IVPB 4.5 g  4.5 g Intravenous Q8H Sharman CheekPhillip Stafford, MD   4.5 g at 11/21/16 0545  . sodium chloride flush (NS) 0.9 % injection 10-40 mL  10-40 mL Intracatheter Q12H Erin FullingKurian Kasa, MD      . sodium chloride flush (NS) 0.9 % injection 10-40 mL  10-40 mL Intracatheter PRN Erin FullingKurian Kasa, MD      . vancomycin (VANCOCIN) IVPB 1000 mg/200 mL premix  1,000 mg Intravenous Q24H Sharman CheekPhillip Stafford, MD        Past Medical History:  Diagnosis Date  . Diabetes mellitus without complication (HCC)   .  Hypertension   . Stroke [facility])     Past Surgical History:  Procedure Laterality Date  . PERIPHERAL VASCULAR CATHETERIZATION N/A 11/14/2016   Procedure: Lower Extremity Angiography;  Surgeon: Annice Needy, MD;  Location: [facility] INVASIVE CV LAB;  Service: Cardiovascular;  Laterality: N/A;    Social History Social History  Substance Use Topics  . Smoking status: Former Smoker    Types: Cigarettes  . Smokeless tobacco: Never Used  . Alcohol use No  Living in a facility, [facility].  Family History  No reported history of bleeding disorders, clotting disorders, autoimmune diseases, or aneurysms  No Known Allergies   REVIEW OF SYSTEMS (Negative unless checked)  Constitutional: [] Weight loss  [] Fever  [] Chills Cardiac: [] Chest pain   [] Chest pressure   [] Palpitations   [] Shortness of breath when laying flat   [] Shortness of breath at rest   [] Shortness of breath with  exertion. Vascular:  [] Pain in legs with walking   [] Pain in legs at rest   [] Pain in legs when laying flat   [] Claudication   [] Pain in feet when walking  [] Pain in feet at rest  [] Pain in feet when laying flat   [] History of DVT   [] Phlebitis   [] Swelling in legs   [] Varicose veins   [x] Non-healing ulcers Pulmonary:   [] Uses home oxygen   [] Productive cough   [] Hemoptysis   [x] Wheeze  [x] COPD   [] Asthma Neurologic:  [] Dizziness  [] Blackouts   [] Seizures   [] History of stroke   [] History of TIA  [] Aphasia   [] Temporary blindness   [] Dysphagia   [] Weakness or numbness in arms   [] Weakness or numbness in legs Musculoskeletal:  [] Arthritis   [] Joint swelling   [] Joint pain   [] Low back pain Hematologic:  [] Easy bruising  [] Easy bleeding   [] Hypercoagulable state   [] Anemic  [] Hepatitis Gastrointestinal:  [] Blood in stool   [] Vomiting blood  [] Gastroesophageal reflux/heartburn   [] Difficulty swallowing. Genitourinary:  [] Chronic kidney disease   [] Difficult urination  [] Frequent urination  [] Burning with urination   [] Blood in urine Skin:  [] Rashes   [x] Ulcers   [x] Wounds Psychological:  [] History of anxiety   []  History of major depression.  Physical Examination  Vitals:   11/21/16 0610 11/21/16 0700 11/21/16 0755 11/21/16 0800  BP:  (!) 120/50 (!) 121/54 (!) 138/54  Pulse:  72  76  Resp:  (!) 21  16  Temp:    97.3 F (36.3 C)  TempSrc:    Oral  SpO2: 100% 97%  99%  Weight:      Height:       Body mass index is 31.81 kg/m. Gen:  WD/WN, NAD. Somewhat debilitated appearing Head: [place]/AT, No temporalis wasting. Prominent temp pulse not noted. Ear/Nose/Throat: Hearing grossly intact, nares w/o erythema or drainage, oropharynx w/o Erythema/Exudate Eyes: Sclera non-icteric, conjunctiva clear Neck: Trachea midline.  No JVD.  Pulmonary:  Good air movement, respirations not labored, equal bilaterally.  Cardiac: RRR, normal S1, S2. Vascular:  Vessel Right Left  Radial Palpable Palpable  Ulnar  Palpable Palpable  Brachial Palpable Palpable  Carotid Palpable, without bruit Palpable, without bruit  Aorta Not palpable N/A  Femoral Trace Palpable Palpable  Popliteal Not Palpable Palpable  PT Not Palpable Not Palpable  DP Not Palpable 1+ Palpable   Gastrointestinal: soft, non-tender/non-distended. No guarding/reflex.  Musculoskeletal: M/S 5/5 throughout.  Ulcerations on the left foot stable and dressed. No erythema or drainage. Neurologic: [name] somewhat somnolent which is her baseline. Moves all 4  without an obvious deficit Psychiatric: Poor historian and insight. Dermatologic: No rashes or ulcers noted.  No cellulitis or open wounds. Lymph : No Cervical, Axillary, or Inguinal lymphadenopathy.      CBC Lab Results  Component Value Date   WBC 13.2 (H) 11/21/2016   HGB 9.2 (L) 11/21/2016   HCT 28.0 (L) 11/21/2016   MCV 89.5 11/21/2016   PLT 326 11/21/2016    BMET    Component Value Date/Time   NA 139 11/21/2016 0404   NA 141 02/04/2014 1636   K 4.9 11/21/2016 0404   K 4.3 02/04/2014 1636   CL 105 11/21/2016 0404   CL 108 (H) 02/04/2014 1636   CO2 27 11/21/2016 0404   CO2 29 02/04/2014 1636   GLUCOSE 266 (H) 11/21/2016 0404   GLUCOSE 225 (H) 02/04/2014 1636   BUN 26 (H) 11/21/2016 0404   BUN 12 02/04/2014 1636   CREATININE 1.17 (H) 11/21/2016 0404   CREATININE 1.06 02/04/2014 1636   CALCIUM 8.2 (L) 11/21/2016 0404   CALCIUM 9.3 02/04/2014 1636   GFRNONAA 43 (L) 11/21/2016 0404   GFRNONAA 51 (L) 02/04/2014 1636   GFRAA 50 (L) 11/21/2016 0404   GFRAA 59 (L) 02/04/2014 1636   Estimated Creatinine Clearance: 37.3 mL/min (by C-G formula based on SCr of 1.17 mg/dL (H)).  COAG Lab Results  Component Value Date   INR 1.08 11/20/2016    Radiology Ct Head Wo Contrast  Result Date: 11/09/2016 CLINICAL DATA:  Altered mental status with disorientation. Difficulty ambulating. Unwitnessed fall. EXAM: CT HEAD WITHOUT CONTRAST TECHNIQUE: Contiguous axial  images were obtained from the base of the skull through the vertex without intravenous contrast. COMPARISON:  April 02, 2016 FINDINGS: Brain: Moderate diffuse atrophy is stable. There is no intracranial mass, hemorrhage, extra-axial fluid collection, or midline shift. There is evidence of prior infarct in the medial left occipital lobe. The patient has had a prior infarct in the medial posterior right lentiform nucleus. There is evidence of prior lacunar type infarcts in each pons region, slightly larger on the left than on the right. There is small vessel disease throughout the centra semiovale bilaterally. No acute infarct is appreciable on this study. Vascular: There is no hyperdense vessel. There is calcification in each carotid siphon region. Skull: The bony calvarium appears intact. Sinuses/Orbits: There is mild mucosal thickening in several ethmoid air cells bilaterally. Visualized paranasal sinuses elsewhere clear. Visualized orbits appear symmetric bilaterally. Other: Visualized mastoid air cells are clear. IMPRESSION: Stable atrophy with scattered prior small infarcts as summarized above. There is periventricular small vessel disease in the centra semiovale bilaterally. No acute infarct evident. No mass, hemorrhage, or extra-axial fluid collection. There are foci of calcification in each carotid siphon region. There is mild ethmoid sinus disease bilaterally. Electronically Signed   By: Bretta BangWilliam  Woodruff III M.D.   On: 11/09/2016 13:01   Mr Foot Left Wo Contrast  Result Date: 11/11/2016 CLINICAL DATA:  Diabetic with heel ulcer. Possible fracture of the first proximal phalanx on radiographs. Evaluate for osteomyelitis. History of peripheral neuropathy. EXAM: MRI OF THE LEFT FOOT WITHOUT CONTRAST TECHNIQUE: Multiplanar, multisequence MR imaging of the entire left foot was performed. No intravenous contrast was administered. COMPARISON:  Radiographs 11/09/2016. FINDINGS: Bones/Joint/Cartilage Bone detail  limited by mild motion artifact and the large field-of-view utilized to encompass the entire foot. There is bone marrow edema and linear marrow signal within the proximal first phalanx, corresponding with the questioned fracture on recent radiographs. No other fractures are identified.  There is no bone destruction. Mild degenerative changes are present at the first metatarsal phalangeal joint. There are no other significant arthropathic changes or joint effusions. Ligaments Not relevant for exam/ indication. The Lisfranc ligament appears intact. Muscles and Tendons Mild T2 hyperintensity within the plantar foot musculature, likely from diabetic myopathy. Possible mild flexor tenosynovitis. The ankle tendons appear normal. Soft tissues Mild skin irregularity over the calcaneal tuberosity, corresponding with reported ulceration. No focal underlying fluid collection. IMPRESSION: 1. Nondisplaced fracture of the first proximal phalanx. 2. No evidence of osteomyelitis or soft tissue abscess. 3. 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 Electronically Signed   By: Carey Bullocks M.D.   On: 11/11/2016 13:24   Dg Chest Port 1 View  Result Date: 11/21/2016 CLINICAL DATA:  82 year old female with pulmonary edema. Subsequent encounter. EXAM: PORTABLE CHEST 1 VIEW COMPARISON:  11/20/2016 chest x-ray. FINDINGS: Asymmetric airspace disease slightly greater on the left minimally changed from prior examination which may represent pulmonary edema with pleural effusions. Cannot exclude basilar infiltrate in the proper clinical setting. No gross pneumothorax. Right PICC line tip mid superior vena cava level. Heart size top-normal. IMPRESSION: Asymmetric airspace disease slightly greater on the left minimally changed from prior examination which may represent pulmonary edema with pleural effusions. Cannot exclude basilar infiltrate in the proper clinical setting. Electronically Signed    By: Lacy Duverney M.D.   On: 11/21/2016 07:35   Dg Chest Port 1 View  Result Date: 11/20/2016 CLINICAL DATA:  Dyspnea.  Possible sepsis. EXAM: PORTABLE CHEST 1 VIEW COMPARISON:  11/14/2016 FINDINGS: Right upper extremity PICC line remain satisfactorily positioned. Mild vascular and interstitial congestive changes have developed. Central and basilar hazy airspace opacities have developed. Probable small pleural effusions, right greater than left. IMPRESSION: The findings most likely represent mild congestive heart failure. Cannot exclude infectious infiltrates. Electronically Signed   By: Ellery Plunk M.D.   On: 11/20/2016 23:22   Dg Chest Port 1 View  Result Date: 11/14/2016 CLINICAL DATA:  PICC line placement EXAM: PORTABLE CHEST 1 VIEW COMPARISON:  09/15/2016 FINDINGS: Cardiomediastinal silhouette is unremarkable. No infiltrate or pulmonary edema. There is right arm PICC line with tip in SVC right atrium junction. No pneumothorax. IMPRESSION: Right arm PICC line in place.  No pneumothorax. Electronically Signed   By: Natasha Mead M.D.   On: 11/14/2016 14:19   Dg Abd Portable 1v  Result Date: 11/11/2016 CLINICAL DATA:  Clearance for MRI.  Constipation. EXAM: PORTABLE ABDOMEN - 1 VIEW COMPARISON:  None. FINDINGS: The bowel gas pattern is normal. No radio-opaque calculi or other significant radiographic abnormality are seen. IMPRESSION: Negative. Electronically Signed   By: Ted Mcalpine M.D.   On: 11/11/2016 10:36   Dg Foot Complete Left  Result Date: 11/20/2016 CLINICAL DATA:  Possible sepsis.  Left foot ulcer. EXAM: LEFT FOOT - COMPLETE 3+ VIEW COMPARISON:  11/09/2016, 11/11/2016 FINDINGS: Negative for acute fracture or dislocation. No radiopaque foreign body. No bony destruction. No soft tissue gas. IMPRESSION: No acute findings. No radiographic evidence of osteomyelitis. No soft tissue gas. Electronically Signed   By: Ellery Plunk M.D.   On: 11/20/2016 23:25   Dg Foot Complete  Left  Result Date: 11/09/2016 CLINICAL DATA:  Fall, heel pain and ulcer. EXAM: LEFT FOOT - COMPLETE 3+ VIEW COMPARISON:  None available FINDINGS: Severe osteopenia noted throughout the left foot. Peripheral atherosclerosis present. Degenerative changes noted of the MTP joints and the interphalangeal joints compatible with osteoarthritis. Normal alignment. Linear lucency  through the left great toe proximal phalanx, suspicious for a nondisplaced fracture. Recommend correlation for point tenderness in this region. Small plantar calcaneal spur.  Calcaneus appears intact. IMPRESSION: Left great toe proximal phalanx linear lucency concerning for subtle nondisplaced fracture. Recommend correlation for point tenderness in this region. Severe osteopenia and diffuse osteoarthritis Peripheral atherosclerosis Plantar calcaneal spur. Electronically Signed   By: Judie Petit.  Shick M.D.   On: 11/09/2016 16:54      Assessment/Plan 1. Significant bilateral lower extremity peripheral vascular disease. Status post left lower extremity revascularization last week. She was already on the schedule for right lower extremity revascularization today. Appears stable for her procedure today. We will go ahead and proceed with right lower extremity angiogram today. 2. Respiratory insufficiency. Significantly improved and saturating 100% on 2 L nasal cannula with no labored respirations this morning. We can use BiPAP during the procedure if necessary, but I believe she is near her baseline and respiratory function. 3. Left lower extremity ulceration. Associated with poor perfusion. Now status post revascularization. 4. Enterococcus faecalis bacteremia. Getting intravenous antibiotics for this. 5. Diabetes. blood glucose control important in reducing the progression of atherosclerotic disease. Also, involved in wound healing. On appropriate medications.    [name], MD  11/21/2016 9:00 AM    This note was created with Dragon  medical transcription system.  Any error is purely unintentional

## 2016-11-21 NOTE — Progress Notes (Signed)
Inpatient Diabetes Program Recommendations  AACE/ADA: New Consensus Statement on Inpatient Glycemic Control (2015)  Target Ranges:  Prepandial:   less than 140 mg/dL      Peak postprandial:   less than 180 mg/dL (1-2 hours)      Critically ill patients:  140 - 180 mg/dL   Results for [name], Deshanda (MRN [id number]) as of 11/21/2016 09:22  Ref. Range 11/20/2016 07:49 11/20/2016 22:09 11/21/2016 02:43 11/21/2016 07:43  Glucose-Capillary Latest Ref Range: 65 - 99 mg/dL 409184 (H) 811238 (H) 914265 (H) 239 (H)   Review of Glycemic Control  Diabetes history: DM2 Outpatient Diabetes medications: 70/30 38 units QAM with breakfast, 70/30 15 units QPM with supper Current orders for Inpatient glycemic control: Novolog 0-15 units Q4H  Inpatient Diabetes Program Recommendations: Insulin - Basal: Please consider ordering Lantus 16 units Q24H (based on 78 kg x 0.2 units).  Thanks, Orlando PennerMarie Abena Erdman, RN, MSN, CDE Diabetes Coordinator Inpatient Diabetes Program [phone] (Team Pager from 8am to 5pm)

## 2016-11-22 ENCOUNTER — Encounter: Payer: Self-pay | Admitting: Vascular Surgery

## 2016-11-22 DIAGNOSIS — I70238 Atherosclerosis of native arteries of right leg with ulceration of other part of lower right leg: Secondary | ICD-10-CM

## 2016-11-22 LAB — GLUCOSE, CAPILLARY
GLUCOSE-CAPILLARY: 230 mg/dL — AB (ref 65–99)
GLUCOSE-CAPILLARY: 278 mg/dL — AB (ref 65–99)
Glucose-Capillary: 156 mg/dL — ABNORMAL HIGH (ref 65–99)
Glucose-Capillary: 168 mg/dL — ABNORMAL HIGH (ref 65–99)
Glucose-Capillary: 193 mg/dL — ABNORMAL HIGH (ref 65–99)
Glucose-Capillary: 203 mg/dL — ABNORMAL HIGH (ref 65–99)
Glucose-Capillary: 233 mg/dL — ABNORMAL HIGH (ref 65–99)

## 2016-11-22 LAB — URINE CULTURE: Culture: NO GROWTH

## 2016-11-22 LAB — PROCALCITONIN: Procalcitonin: 1.82 ng/mL

## 2016-11-22 LAB — BASIC METABOLIC PANEL
Anion gap: 4 — ABNORMAL LOW (ref 5–15)
BUN: 32 mg/dL — AB (ref 6–20)
CALCIUM: 8.1 mg/dL — AB (ref 8.9–10.3)
CO2: 30 mmol/L (ref 22–32)
CREATININE: 1.27 mg/dL — AB (ref 0.44–1.00)
Chloride: 104 mmol/L (ref 101–111)
GFR calc Af Amer: 45 mL/min — ABNORMAL LOW (ref >60)
GFR, EST NON AFRICAN AMERICAN: 39 mL/min — AB (ref >60)
Glucose, Bld: 202 mg/dL — ABNORMAL HIGH (ref 65–99)
POTASSIUM: 4.4 mmol/L (ref 3.5–5.1)
SODIUM: 138 mmol/L (ref 135–145)

## 2016-11-22 LAB — CBC
HCT: 24.1 % — ABNORMAL LOW (ref 35.0–47.0)
Hemoglobin: 7.9 g/dL — ABNORMAL LOW (ref 12.0–16.0)
MCH: 29.5 pg (ref 26.0–34.0)
MCHC: 33 g/dL (ref 32.0–36.0)
MCV: 89.4 fL (ref 80.0–100.0)
PLATELETS: 318 10*3/uL (ref 150–440)
RBC: 2.69 MIL/uL — AB (ref 3.80–5.20)
RDW: 16.1 % — AB (ref 11.5–14.5)
WBC: 9 10*3/uL (ref 3.6–11.0)

## 2016-11-22 MED ORDER — SODIUM CHLORIDE 0.9 % IV SOLN
3.0000 g | Freq: Four times a day (QID) | INTRAVENOUS | Status: DC
  Administered 2016-11-22 – 2016-11-23 (×4): 3 g via INTRAVENOUS
  Filled 2016-11-22 (×10): qty 3

## 2016-11-22 MED ORDER — VANCOMYCIN HCL IN DEXTROSE 750-5 MG/150ML-% IV SOLN
750.0000 mg | INTRAVENOUS | Status: DC
  Administered 2016-11-22: 750 mg via INTRAVENOUS
  Filled 2016-11-22: qty 150

## 2016-11-22 MED ORDER — INSULIN GLARGINE 100 UNIT/ML ~~LOC~~ SOLN
20.0000 [IU] | Freq: Every day | SUBCUTANEOUS | Status: DC
  Administered 2016-11-22: 20 [IU] via SUBCUTANEOUS
  Filled 2016-11-22 (×2): qty 0.2

## 2016-11-22 NOTE — Clinical Social Work Note (Addendum)
Clinical Social Work Assessment  Patient Details  Name: [name] [name] MRN: [id number] Date of Birth: 02-Oct-1934  Date of referral:  11/22/16               Reason for consult:  Facility Placement                Permission sought to share information with:  [facility]cean[facility] Contact Representative Permission granted to share information::  Yes, Verbal Permission Granted  Name::        Agency::     Relationship::     Contact Information:     Housing/Transportation Living arrangements for the past 2 months:  Single Family Home, Skilled Nursing Facility Source of Information:  Patient Patient Interpreter Needed:  None Criminal Activity/Legal Involvement Pertinent to Current Situation/Hospitalization:  No - Comment as needed Significant Relationships:  Adult Children Lives with:  Adult Children Do you feel safe going back to the place where you live?  Yes Need for family participation in patient care:  Yes (Comment)  Care giving concerns:  Patient typically resides with her daughter but has been [facility] [employer]    Social Worker assessment / plan:  CSW spoke with patient and she stated that her daughter is not able to provide much assistance at this time because she has a broken leg. Patient stated that she does not believe she could manage at home on her own just yet and is in agreement to return to [facility] when time. CSW informed by physician that patient may discharge tomorrow and that patient may need a bipap at night but is going to try and see how she does without it tonight. CSW has prepared [facility] for the fact that she may need a bipap and they can arrange this but have to have it ordered by 4pm the day prior to discharge.   CSW has contacted patient's insurance and given them a heads up that patient need to discharge tomorrow.  Employment status:  Retired Database administratornsurance information:  Managed Medicare PT Recommendations:    Information / Referral to community  resources:     Patient/Family's Response to care:  Patient expressed appreciation for CSW assistance.  Patient/Family's Understanding of and Emotional Response to Diagnosis, Current Treatment, and Prognosis:  Patient understands her limitations at this time and is looking forward to completing her rehab and then returning home.  Emotional Assessment Appearance:  Appears stated age Attitude/Demeanor/Rapport:   (pleasant and cooperative) Affect (typically observed):  Accepting, Adaptable, Calm, Pleasant Orientation:  Oriented to Situation, Oriented to  Time, Oriented to Place, Oriented to Self Alcohol / Substance use:  Not Applicable Psych involvement (Current and /or in the community):  No (Comment)  Discharge Needs  Concerns to be addressed:  Care Coordination Readmission within the last 30 days:  No Current discharge risk:  None Barriers to Discharge:  No Barriers Identified   York SpanielMonica Llana Deshazo, LCSW 11/22/2016, 3:11 PM

## 2016-11-22 NOTE — Progress Notes (Signed)
Inpatient Diabetes Program Recommendations  AACE/ADA: New Consensus Statement on Inpatient Glycemic Control (2015)  Target Ranges:  Prepandial:   less than 140 mg/dL      Peak postprandial:   less than 180 mg/dL (1-2 hours)      Critically ill patients:  140 - 180 mg/dL   Results for [name] [name], [name] [name] (MRN [id number]) as of 11/22/2016 08:26  Ref. Range 11/21/2016 07:43 11/21/2016 12:14 11/21/2016 16:10 11/21/2016 20:17 11/21/2016 22:20 11/22/2016 00:05 11/22/2016 04:23 11/22/2016 07:40  Glucose-Capillary Latest Ref Range: 65 - 99 mg/dL 657239 (H) 846210 (H) 962280 (H) 299 (H) 291 (H) 278 (H) 203 (H) 168 (H)    Review of Glycemic Control  Diabetes history: DM2 Outpatient Diabetes medications: 70/30 38 units QAM with breakfast, 70/30 15 units QPM with supper Current orders for Inpatient glycemic control: Novolog 0-15 units Q4H  Inpatient Diabetes Program Recommendations: Insulin - Basal: Over the past 24 hours, glucose has ranged from 168-299 mg/dl and patient has received a total of Novolog 36 units for correction. Please consider ordering Lantus 20 units Q24H (based on 78 kg x 0.25 units).  Thanks, [name] PennerMarie Lacy Taglieri, RN, MSN, CDE Diabetes Coordinator Inpatient Diabetes Program [phone] (Team Pager from 8am to 5pm)

## 2016-11-22 NOTE — Progress Notes (Signed)
[employer] [facility] [place] [facility]   PATIENT NAME: [name] [name]    MR#:  [id number]  DATE OF BIRTH:  06/06/1934  SUBJECTIVE:  CHIEF COMPLAINT:   Chief Complaint  Patient presents with  . Code Sepsis   - ICU Tx, admitted with resp distress, needed Bipap last night - Recent admission for left heel ulcer and enterococcus bacteremia. Has a PICC line receiving IV ampicillin. -Left lower extremity angiogram done and stenting done yesterday.  REVIEW OF SYSTEMS:  Review of Systems  Constitutional: Positive for malaise/fatigue. Negative for chills and fever.  HENT: Negative for congestion, ear discharge, hearing loss and nosebleeds.   Eyes: Negative for blurred vision and double vision.  Respiratory: Positive for shortness of breath. Negative for cough and wheezing.   Cardiovascular: Positive for leg swelling. Negative for chest pain and palpitations.  Gastrointestinal: Negative for abdominal pain, constipation, diarrhea, nausea and vomiting.  Genitourinary: Negative for dysuria.  Neurological: Negative for dizziness, tingling, sensory change, speech change, focal weakness, seizures and headaches.    DRUG ALLERGIES:  No Known Allergies  VITALS:  Blood pressure (!) 122/52, pulse 68, temperature 98.2 F (36.8 C), temperature source Oral, resp. rate 18, height 5\' 2"  (1.575 m), weight 78.9 kg (173 lb 14.4 oz), SpO2 98 %.  PHYSICAL EXAMINATION:  Physical Exam  GENERAL:  82 y.o.-year-old patient lying in the bed with no acute distress.  EYES: Pupils equal, round, reactive to light and accommodation. No scleral icterus. Extraocular muscles intact.  HEENT: Head atraumatic, normocephalic. Oropharynx and nasopharynx clear.  NECK:  Supple, no jugular venous distention. No thyroid enlargement, no tenderness.  LUNGS: Normal breath sounds bilaterally, no wheezing, rales,rhonchi or crepitation. No use of accessory muscles of respiration. Decreased bibasilar breath  sounds. CARDIOVASCULAR: S1, S2 normal. No rubs, or gallops. 3/6 systolic murmur present ABDOMEN: Soft, nontender, nondistended. Bowel sounds present. No organomegaly or mass.  EXTREMITIES: No pedal edema, cyanosis, or clubbing.  NEUROLOGIC: Cranial nerves II through XII are intact. Muscle strength 5/5 in all extremities. Sensation intact. Gait not checked. Following commands. Global weakness noted. PSYCHIATRIC: The patient is alert and oriented x 2.  SKIN: No obvious rash, lesion, or ulcer.    LABORATORY PANEL:   CBC  Recent Labs Lab 11/22/16 0405  WBC 9.0  HGB 7.9*  HCT 24.1*  PLT 318   ------------------------------------------------------------------------------------------------------------------  Chemistries   Recent Labs Lab 11/20/16 2202 11/21/16 0404  11/22/16 0405  NA 140 139  --  138  K 5.2* 4.9  --  4.4  CL 105 105  --  104  CO2 27 27  --  30  GLUCOSE 257* 266*  --  202*  BUN 24* 26*  < > 32*  CREATININE 1.06* 1.17*  < > 1.27*  CALCIUM 8.8* 8.2*  --  8.1*  MG  --  2.2  --   --   AST 25  --   --   --   ALT 24  --   --   --   ALKPHOS 88  --   --   --   BILITOT 0.3  --   --   --   < > = values in this interval not displayed. ------------------------------------------------------------------------------------------------------------------  Cardiac Enzymes  Recent Labs Lab 11/20/16 2202  TROPONINI <0.03   ------------------------------------------------------------------------------------------------------------------  RADIOLOGY:  Dg Chest Port 1 View  Result Date: 11/21/2016 CLINICAL DATA:  82 year old female with pulmonary edema. Subsequent encounter. EXAM: PORTABLE CHEST 1 VIEW COMPARISON:  11/20/2016 chest  x-ray. FINDINGS: Asymmetric airspace disease slightly greater on the left minimally changed from prior examination which may represent pulmonary edema with pleural effusions. Cannot exclude basilar infiltrate in the proper clinical setting. No  gross pneumothorax. Right PICC line tip mid superior vena cava level. Heart size top-normal. IMPRESSION: Asymmetric airspace disease slightly greater on the left minimally changed from prior examination which may represent pulmonary edema with pleural effusions. Cannot exclude basilar infiltrate in the proper clinical setting. Electronically Signed   By: Lacy DuverneySteven  Olson M.D.   On: 11/21/2016 07:35   Dg Chest Port 1 View  Result Date: 11/20/2016 CLINICAL DATA:  Dyspnea.  Possible sepsis. EXAM: PORTABLE CHEST 1 VIEW COMPARISON:  11/14/2016 FINDINGS: Right upper extremity PICC line remain satisfactorily positioned. Mild vascular and interstitial congestive changes have developed. Central and basilar hazy airspace opacities have developed. Probable small pleural effusions, right greater than left. IMPRESSION: The findings most likely represent mild congestive heart failure. Cannot exclude infectious infiltrates. Electronically Signed   By: Ellery Plunkaniel R Mitchell M.D.   On: 11/20/2016 23:22   Dg Foot Complete Left  Result Date: 11/20/2016 CLINICAL DATA:  Possible sepsis.  Left foot ulcer. EXAM: LEFT FOOT - COMPLETE 3+ VIEW COMPARISON:  11/09/2016, 11/11/2016 FINDINGS: Negative for acute fracture or dislocation. No radiopaque foreign body. No bony destruction. No soft tissue gas. IMPRESSION: No acute findings. No radiographic evidence of osteomyelitis. No soft tissue gas. Electronically Signed   By: Ellery Plunkaniel R Mitchell M.D.   On: 11/20/2016 23:25    EKG:   Orders placed or performed during the hospital encounter of 11/20/16  . EKG 12-Lead  . EKG 12-Lead    ASSESSMENT AND PLAN:   82 year old female with past medical history significant for hypertension, stroke, diabetes mellitus, peripheral vascular disease, recent admission for left heel ulcer noted to have enterococcus bacteremia presents from [facility] secondary to acute respiratory distress.  #1 acute respiratory distress-secondary to  acute on chronic COPD exacerbation and also acute diastolic failure. -Echo with normal ejection fraction. Currently on Lasix IV. Much improved breathing. - off Bipap now -Chest x-ray with mild pulmonary edema and questionable infiltrate. Patient on antibiotics. -Continue steroids. Change to oral prednisone taper at discharge. -Continue nebulizer treatments.  #2 peripheral vascular disease with left heel ulcer-MRI negative for osteomyelitis. Seen by vascular team. -Status post left lower extremity angiogram and stenting done yesterday. -IV antibiotics changed from ampicillin to Unasyn here in the hospital. Appreciate ID input. -Discharge on Augmentin until at least 11/28/2016. -PICC line can be discontinued at discharge  #3 diabetes mellitus-70/30 insulin at home. Currently on Lantus and sliding scale insulin. Monitor blood sugars. Appreciate diabetes coordinator input  #4 history of CVA-new Plavix, statin. Stable  #5 anemia-baseline hemoglobin seems to be around 9, dropped down to 7.9 today. Also had vascular angiogram done yesterday. -No acute indication for transfusion at this time. Monitor CBC. No active bleeding noted.  #6 DVT prophylaxis-on subcutaneous heparin   Anticipate discharge to [facility] in 1-2 days   All the records are reviewed and case discussed with Care Management/Social Workerr. Management plans discussed with the patient, family and they are in agreement.  CODE STATUS: Full code  TOTAL TIME TAKING CARE OF THIS PATIENT: 39 minutes.   POSSIBLE D/C IN 1-2 DAYS, DEPENDING ON CLINICAL CONDITION.   Enid BaasKALISETTI,Lilas Diefendorf M.D on 11/22/2016 at 4:06 PM  Between 7am to 6pm - Pager - [phone]  After 6pm go to [email or web address] - password [email or web address]  [facility]  Office  [phone]  CC: Primary care physician; Barbette Reichmann, MD

## 2016-11-22 NOTE — Consult Note (Signed)
Pharmacy Antibiotic Note  Frederich Chickrnell Munos is a 82 y.o. female admitted on 11/20/2016 with heel ulcer.  Pharmacy has been consulted for unasyn dosing.  Plan: unasyn 3g q 6hr  Height: 5\' 2"  (157.5 cm) Weight: 173 lb 14.4 oz (78.9 kg) IBW/kg (Calculated) : 50.1  Temp (24hrs), Avg:98.1 F (36.7 C), Min:96.1 F (35.6 C), Max:98.8 F (37.1 C)   Recent Labs Lab 11/20/16 2202 11/21/16 0404 11/21/16 1330 11/22/16 0405  WBC 7.3 13.2*  --  9.0  CREATININE 1.06* 1.17* 1.06* 1.27*  LATICACIDVEN 1.8  --   --   --     Estimated Creatinine Clearance: 34.4 mL/min (by C-G formula based on SCr of 1.27 mg/dL (H)).    No Known Allergies  Antimicrobials this admission: vancomycin 12/3 >> 12/5 zosyn 12/3 >> 12/5 unasyn 12/5>>  Dose adjustments this admission:   Microbiology results:   Thank you for allowing pharmacy to be a part of this patient's care.  Olene FlossMelissa D Sharlize Hoar, Pharm.D, BCPS Clinical Pharmacist  11/22/2016 2:33 PM

## 2016-11-22 NOTE — Progress Notes (Signed)
[facility]  Daily Progress Note   Subjective  - 1 Day Post-Op  Feeling well this am.  Breathing comfortably. Eating breakfast.  No bleeding from the access sites, feet warm  Objective Vitals:   11/22/16 0432 11/22/16 0432 11/22/16 0538 11/22/16 0755  BP:  (!) 110/46 (!) 122/52   Pulse:  72 68   Resp:  18    Temp:  98.2 F (36.8 C)    TempSrc:  Oral    SpO2:  94%  98%  Weight: 78.9 kg (173 lb 14.4 oz)     Height:        Intake/Output Summary (Last 24 hours) at 11/22/16 0848 Last data filed at 11/22/16 0429  Gross per 24 hour  Intake                0 ml  Output              895 ml  Net             -895 ml    PULM  CTAB CV  RRR VASC  Good capillary refill, warm feet bilaterally.  Access sites C/D/I  Laboratory CBC    Component Value Date/Time   WBC 9.0 11/22/2016 0405   HGB 7.9 (L) 11/22/2016 0405   HGB 11.5 (L) 02/04/2014 1522   HCT 24.1 (L) 11/22/2016 0405   HCT 35.8 02/04/2014 1522   PLT 318 11/22/2016 0405   PLT 174 02/04/2014 1522    BMET    Component Value Date/Time   NA 138 11/22/2016 0405   NA 141 02/04/2014 1636   K 4.4 11/22/2016 0405   K 4.3 02/04/2014 1636   CL 104 11/22/2016 0405   CL 108 (H) 02/04/2014 1636   CO2 30 11/22/2016 0405   CO2 29 02/04/2014 1636   GLUCOSE 202 (H) 11/22/2016 0405   GLUCOSE 225 (H) 02/04/2014 1636   BUN 32 (H) 11/22/2016 0405   BUN 12 02/04/2014 1636   CREATININE 1.27 (H) 11/22/2016 0405   CREATININE 1.06 02/04/2014 1636   CALCIUM 8.1 (L) 11/22/2016 0405   CALCIUM 9.3 02/04/2014 1636   GFRNONAA 39 (L) 11/22/2016 0405   GFRNONAA 51 (L) 02/04/2014 1636   GFRAA 45 (L) 11/22/2016 0405   GFRAA 59 (L) 02/04/2014 1636    Assessment/Planning: POD #1 s/p RLE revascularization   Doing well  OK to go back to SNF from my point of view  F/U in clinic in 3-4 weeks with ABIs    [name] BarrenJason [name] [name] [name]  11/22/2016, 8:48 AM

## 2016-11-22 NOTE — Consult Note (Signed)
[facility] Infectious Disease     Reason for Consult Enterococcal bacteremia    Referring Physician: Claria Dice Date of Admission:  11/20/2016   Active Problems:   Acute respiratory distress   HPI: [name] [name] is a 82 y.o. female Admitted with resp distress and admitted to the ED with cxr showing pulm edema.  She had recent admission for L heel ulcer and bacteremia with enterococcus.  She had revascularization 11/27, MRI neg for osteomyelitis and she had PICC placed and was dced on ampicillin IV for 2 week course. Of note TTE neg, no TEE done.  On this admission wbc was 7, no fevers.  Started on vanco and zosyn in unit but now transferred out to floor.  Had L LE angiogram and stenting 12/4.   Past Medical History:  Diagnosis Date  . Diabetes mellitus without complication ([place])   . Hypertension   . Stroke [facility])    Past Surgical History:  Procedure Laterality Date  . PERIPHERAL VASCULAR CATHETERIZATION N/A 11/14/2016   Procedure: Lower Extremity Angiography;  Surgeon: Algernon Huxley, MD;  Location: [place] CV LAB;  Service: Cardiovascular;  Laterality: N/A;  . PERIPHERAL VASCULAR CATHETERIZATION Right 11/21/2016   Procedure: Lower Extremity Angiography;  Surgeon: Algernon Huxley, MD;  Location: [place] [facility] LAB;  Service: Cardiovascular;  Laterality: Right;  . PERIPHERAL VASCULAR CATHETERIZATION N/A 11/21/2016   Procedure: Abdominal Aortogram w/Lower Extremity;  Surgeon: Algernon Huxley, MD;  Location: [place] CV LAB;  Service: Cardiovascular;  Laterality: N/A;  . PERIPHERAL VASCULAR CATHETERIZATION  11/21/2016   Procedure: Lower Extremity Intervention;  Surgeon: Algernon Huxley, MD;  Location: [place] CV LAB;  Service: Cardiovascular;;   Social History  Substance Use Topics  . Smoking status: Former Smoker    Types: Cigarettes  . Smokeless tobacco: Never Used  . Alcohol use No   History reviewed. No pertinent family history.  Allergies: No Known  Allergies  Current antibiotics: Antibiotics Given (last 72 hours)    Date/Time Action Medication Dose Rate   11/21/16 0545 Given   piperacillin-tazobactam (ZOSYN) IVPB 4.5 g 4.5 g 25 mL/hr   11/21/16 1024 Given   cefUROXime (ZINACEF) 1.5 g in dextrose 5 % 50 mL IVPB 1.5 g 100 mL/hr   11/21/16 1422 Given   piperacillin-tazobactam (ZOSYN) IVPB 4.5 g 4.5 g 25 mL/hr   11/21/16 2330 Given  [med unavailable at scheduled time]   piperacillin-tazobactam (ZOSYN) IVPB 4.5 g 4.5 g 25 mL/hr   11/22/16 0500 Given   piperacillin-tazobactam (ZOSYN) IVPB 4.5 g 4.5 g 25 mL/hr      MEDICATIONS: . aspirin EC  81 mg Oral 1 day or 1 dose  . atorvastatin  10 mg Oral QHS  . carvedilol  3.125 mg Oral BID WC  . chlorhexidine  15 mL Mouth Rinse BID  . clopidogrel  75 mg Oral Daily  . furosemide  40 mg Intravenous Daily  . heparin  5,000 Units Subcutaneous Q8H  . insulin aspart  0-15 Units Subcutaneous Q4H  . insulin glargine  20 Units Subcutaneous QHS  . ipratropium-albuterol  3 mL Nebulization Q6H  . lisinopril  2.5 mg Oral Daily  . loratadine  10 mg Oral Daily  . mouth rinse  15 mL Mouth Rinse q12n4p  . piperacillin-tazobactam (ZOSYN)  IV  4.5 g Intravenous Q8H  . sodium chloride flush  10-40 mL Intracatheter Q12H  . Vancomycin  750 mg Intravenous Q24H    Review of Systems - unable to obtain  OBJECTIVE: Temp:  [96.1 F (35.6 C)-98.8 F (37.1 C)] 98.2 F (36.8 C) (12/05 0432) Pulse Rate:  [68-91] 68 (12/05 0538) Resp:  [16-29] 18 (12/05 0432) BP: (110-162)/(46-87) 122/52 (12/05 0538) SpO2:  [94 %-100 %] 98 % (12/05 0755) Weight:  [78.9 kg (173 lb 14.4 oz)] 78.9 kg (173 lb 14.4 oz) (12/05 0432) Constitutional: frail, disoriented HENT: [place]/AT, PERRLA, no scleral icterus Mouth/Throat: Oropharynx is clear and moist. No oropharyngeal exudate.  Cardiovascular: Normal rate, regular rhythm and normal heart sounds. Pulmonary/Chest: Effort normal and breath sounds normal. No respiratory distress.   has no wheezes.  Neck = supple, no nuchal rigidity Abdominal: Soft. Bowel sounds are normal.  exhibits no distension. There is no tenderness.  Lymphadenopathy: no cervical adenopathy. No axillary adenopathy Ext no cce Neurological: disoriented  Skin: L heel with ulceration, dark eschar which is peeling in spots, has odor, mild surrounding erythema R lateral malleous with a heaped up wound  But this is quite dry, nontender Psychiatric: a normal mood and affect.  behavior is normal.    LABS: Results for orders placed or performed during the hospital encounter of 11/20/16 (from the past 48 hour(s))  Lactic acid, plasma     Status: None   Collection Time: 11/20/16 10:02 PM  Result Value Ref Range   Lactic Acid, Venous 1.8 0.5 - 1.9 mmol/L  Comprehensive metabolic panel     Status: Abnormal   Collection Time: 11/20/16 10:02 PM  Result Value Ref Range   Sodium 140 135 - 145 mmol/L   Potassium 5.2 (H) 3.5 - 5.1 mmol/L   Chloride 105 101 - 111 mmol/L   CO2 27 22 - 32 mmol/L   Glucose, Bld 257 (H) 65 - 99 mg/dL   BUN 24 (H) 6 - 20 mg/dL   Creatinine, Ser 1.06 (H) 0.44 - 1.00 mg/dL   Calcium 8.8 (L) 8.9 - 10.3 mg/dL   Total Protein 8.6 (H) 6.5 - 8.1 g/dL   Albumin 2.6 (L) 3.5 - 5.0 g/dL   AST 25 15 - 41 U/L   ALT 24 14 - 54 U/L   Alkaline Phosphatase 88 38 - 126 U/L   Total Bilirubin 0.3 0.3 - 1.2 mg/dL   GFR calc non Af Amer 48 (L) >60 mL/min   GFR calc Af Amer 56 (L) >60 mL/min    Comment: (NOTE) The eGFR has been calculated using the CKD EPI equation. This calculation has not been validated in all clinical situations. eGFR's persistently <60 mL/min signify possible Chronic Kidney Disease.    Anion gap 8 5 - 15  Lipase, blood     Status: None   Collection Time: 11/20/16 10:02 PM  Result Value Ref Range   Lipase 24 11 - 51 U/L  Troponin I     Status: None   Collection Time: 11/20/16 10:02 PM  Result Value Ref Range   Troponin I <0.03 <0.03 ng/mL  CBC WITH DIFFERENTIAL      Status: Abnormal   Collection Time: 11/20/16 10:02 PM  Result Value Ref Range   WBC 7.3 3.6 - 11.0 K/uL   RBC 3.47 (L) 3.80 - 5.20 MIL/uL   Hemoglobin 10.3 (L) 12.0 - 16.0 g/dL   HCT 31.2 (L) 35.0 - 47.0 %   MCV 90.0 80.0 - 100.0 fL   MCH 29.7 26.0 - 34.0 pg   MCHC 33.0 32.0 - 36.0 g/dL   RDW 16.1 (H) 11.5 - 14.5 %   Platelets 353 150 - 440 K/uL  Neutrophils Relative % 76 %   Neutro Abs 5.6 1.4 - 6.5 K/uL   Lymphocytes Relative 15 %   Lymphs Abs 1.1 1.0 - 3.6 K/uL   Monocytes Relative 3 %   Monocytes Absolute 0.2 0.2 - 0.9 K/uL   Eosinophils Relative 5 %   Eosinophils Absolute 0.4 0 - 0.7 K/uL   Basophils Relative 1 %   Basophils Absolute 0.1 0 - 0.1 K/uL  APTT     Status: None   Collection Time: 11/20/16 10:02 PM  Result Value Ref Range   aPTT 27 24 - 36 seconds  Protime-INR     Status: None   Collection Time: 11/20/16 10:02 PM  Result Value Ref Range   Prothrombin Time 14.0 11.4 - 15.2 seconds   INR 1.08   Procalcitonin - Baseline     Status: None   Collection Time: 11/20/16 10:02 PM  Result Value Ref Range   Procalcitonin <0.10 ng/mL    Comment:        Interpretation: PCT (Procalcitonin) <= 0.5 ng/mL: Systemic infection (sepsis) is not likely. Local bacterial infection is possible. (NOTE)         ICU PCT Algorithm               Non ICU PCT Algorithm    ----------------------------     ------------------------------         PCT < 0.25 ng/mL                 PCT < 0.1 ng/mL     Stopping of antibiotics            Stopping of antibiotics       strongly encouraged.               strongly encouraged.    ----------------------------     ------------------------------       PCT level decrease by               PCT < 0.25 ng/mL       >= 80% from peak PCT       OR PCT 0.25 - 0.5 ng/mL          Stopping of antibiotics                                             encouraged.     Stopping of antibiotics           encouraged.    ----------------------------      ------------------------------       PCT level decrease by              PCT >= 0.25 ng/mL       < 80% from peak PCT        AND PCT >= 0.5 ng/mL            Continuin g antibiotics                                              encouraged.       Continuing antibiotics            encouraged.    ----------------------------     ------------------------------     PCT level increase compared  PCT > 0.5 ng/mL         with peak PCT AND          PCT >= 0.5 ng/mL             Escalation of antibiotics                                          strongly encouraged.      Escalation of antibiotics        strongly encouraged.   Blood Culture (routine x 2)     Status: None (Preliminary result)   Collection Time: 11/20/16 10:03 PM  Result Value Ref Range   Specimen Description BLOOD LEFT FOREARM    Special Requests BOTTLES DRAWN AEROBIC AND ANAEROBIC 6CCAERO,3CCANA    Culture NO GROWTH 2 DAYS    Report Status PENDING   Urinalysis complete, with microscopic ([place] only)     Status: Abnormal   Collection Time: 11/20/16 10:03 PM  Result Value Ref Range   Color, Urine YELLOW (A) YELLOW   APPearance HAZY (A) CLEAR   Glucose, UA 50 (A) NEGATIVE mg/dL   Bilirubin Urine NEGATIVE NEGATIVE   Ketones, ur NEGATIVE NEGATIVE mg/dL   Specific Gravity, Urine 1.018 1.005 - 1.030   Hgb urine dipstick NEGATIVE NEGATIVE   pH 5.0 5.0 - 8.0   Protein, ur 100 (A) NEGATIVE mg/dL   Nitrite NEGATIVE NEGATIVE   Leukocytes, UA NEGATIVE NEGATIVE   RBC / HPF 0-5 0 - 5 RBC/hpf   WBC, UA 0-5 0 - 5 WBC/hpf   Bacteria, UA RARE (A) NONE SEEN   Squamous Epithelial / LPF 0-5 (A) NONE SEEN   Amorphous Crystal PRESENT   Urine culture     Status: None   Collection Time: 11/20/16 10:03 PM  Result Value Ref Range   Specimen Description URINE, RANDOM    Special Requests NONE    Culture NO GROWTH Performed at [facility]     Report Status 11/22/2016 FINAL   Blood gas, venous ([facility], [facility], [facility])     Status: Abnormal    Collection Time: 11/20/16 10:03 PM  Result Value Ref Range   FIO2 0.30    Delivery systems BILEVEL POSITIVE AIRWAY PRESSURE    pH, Ven 7.27 7.250 - 7.430   pCO2, Ven 63 (H) 44.0 - 60.0 mmHg   pO2, Ven 35.0 32.0 - 45.0 mmHg   Bicarbonate 28.9 (H) 20.0 - 28.0 mmol/L   Acid-Base Excess 1.0 0.0 - 2.0 mmol/L   O2 Saturation 57.7 %   Patient temperature 37.0    Collection site VENOUS    Sample type VENOUS   Glucose, capillary     Status: Abnormal   Collection Time: 11/20/16 10:09 PM  Result Value Ref Range   Glucose-Capillary 238 (H) 65 - 99 mg/dL  Blood Culture (routine x 2)     Status: None (Preliminary result)   Collection Time: 11/20/16 10:40 PM  Result Value Ref Range   Specimen Description BLOOD BLOOD LEFT HAND    Special Requests BOTTLES DRAWN AEROBIC AND ANAEROBIC AER6CC,ANA4CC    Culture NO GROWTH 2 DAYS    Report Status PENDING   Blood gas, arterial     Status: Abnormal   Collection Time: 11/21/16 12:20 AM  Result Value Ref Range   FIO2 0.30    Delivery systems BILEVEL POSITIVE AIRWAY PRESSURE    Inspiratory PAP  10    Expiratory PAP 5.0    pH, Arterial 7.35 7.350 - 7.450   pCO2 arterial 49 (H) 32.0 - 48.0 mmHg   pO2, Arterial 94 83.0 - 108.0 mmHg   Bicarbonate 27.1 20.0 - 28.0 mmol/L   Acid-Base Excess 1.1 0.0 - 2.0 mmol/L   O2 Saturation 96.9 %   Patient temperature 37.0    Sample type ARTERIAL DRAW    Allens test (pass/fail) PASS PASS   Mechanical Rate 8   MRSA PCR Screening     Status: None   Collection Time: 11/21/16  2:38 AM  Result Value Ref Range   MRSA by PCR NEGATIVE NEGATIVE    Comment:        The GeneXpert MRSA Assay (FDA approved for NASAL specimens only), is one component of a comprehensive MRSA colonization surveillance program. It is not intended to diagnose MRSA infection nor to guide or monitor treatment for MRSA infections.   Glucose, capillary     Status: Abnormal   Collection Time: 11/21/16  2:43 AM  Result Value Ref Range    Glucose-Capillary 265 (H) 65 - 99 mg/dL  CBC     Status: Abnormal   Collection Time: 11/21/16  4:04 AM  Result Value Ref Range   WBC 13.2 (H) 3.6 - 11.0 K/uL   RBC 3.13 (L) 3.80 - 5.20 MIL/uL   Hemoglobin 9.2 (L) 12.0 - 16.0 g/dL   HCT 28.0 (L) 35.0 - 47.0 %   MCV 89.5 80.0 - 100.0 fL   MCH 29.4 26.0 - 34.0 pg   MCHC 32.9 32.0 - 36.0 g/dL   RDW 15.9 (H) 11.5 - 14.5 %   Platelets 326 150 - 440 K/uL  Basic metabolic panel     Status: Abnormal   Collection Time: 11/21/16  4:04 AM  Result Value Ref Range   Sodium 139 135 - 145 mmol/L   Potassium 4.9 3.5 - 5.1 mmol/L   Chloride 105 101 - 111 mmol/L   CO2 27 22 - 32 mmol/L   Glucose, Bld 266 (H) 65 - 99 mg/dL   BUN 26 (H) 6 - 20 mg/dL   Creatinine, Ser 1.17 (H) 0.44 - 1.00 mg/dL   Calcium 8.2 (L) 8.9 - 10.3 mg/dL   GFR calc non Af Amer 43 (L) >60 mL/min   GFR calc Af Amer 50 (L) >60 mL/min    Comment: (NOTE) The eGFR has been calculated using the CKD EPI equation. This calculation has not been validated in all clinical situations. eGFR's persistently <60 mL/min signify possible Chronic Kidney Disease.    Anion gap 7 5 - 15  Magnesium     Status: None   Collection Time: 11/21/16  4:04 AM  Result Value Ref Range   Magnesium 2.2 1.7 - 2.4 mg/dL  Phosphorus     Status: None   Collection Time: 11/21/16  4:04 AM  Result Value Ref Range   Phosphorus 3.6 2.5 - 4.6 mg/dL  Glucose, capillary     Status: Abnormal   Collection Time: 11/21/16  7:43 AM  Result Value Ref Range   Glucose-Capillary 239 (H) 65 - 99 mg/dL  Glucose, capillary     Status: Abnormal   Collection Time: 11/21/16 12:14 PM  Result Value Ref Range   Glucose-Capillary 210 (H) 65 - 99 mg/dL  BUN     Status: Abnormal   Collection Time: 11/21/16  1:30 PM  Result Value Ref Range   BUN 25 (H) 6 - 20 mg/dL  Creatinine, serum     Status: Abnormal   Collection Time: 11/21/16  1:30 PM  Result Value Ref Range   Creatinine, Ser 1.06 (H) 0.44 - 1.00 mg/dL   GFR calc non Af  Amer 48 (L) >60 mL/min   GFR calc Af Amer 56 (L) >60 mL/min    Comment: (NOTE) The eGFR has been calculated using the CKD EPI equation. This calculation has not been validated in all clinical situations. eGFR's persistently <60 mL/min signify possible Chronic Kidney Disease.   Glucose, capillary     Status: Abnormal   Collection Time: 11/21/16  4:10 PM  Result Value Ref Range   Glucose-Capillary 280 (H) 65 - 99 mg/dL  Glucose, capillary     Status: Abnormal   Collection Time: 11/21/16  8:17 PM  Result Value Ref Range   Glucose-Capillary 299 (H) 65 - 99 mg/dL  Glucose, capillary     Status: Abnormal   Collection Time: 11/21/16 10:20 PM  Result Value Ref Range   Glucose-Capillary 291 (H) 65 - 99 mg/dL  Glucose, capillary     Status: Abnormal   Collection Time: 11/22/16 12:05 AM  Result Value Ref Range   Glucose-Capillary 278 (H) 65 - 99 mg/dL  Procalcitonin     Status: None   Collection Time: 11/22/16  4:05 AM  Result Value Ref Range   Procalcitonin 1.82 ng/mL    Comment:        Interpretation: PCT > 0.5 ng/mL and <= 2 ng/mL: Systemic infection (sepsis) is possible, but other conditions are known to elevate PCT as well. (NOTE)         ICU PCT Algorithm               Non ICU PCT Algorithm    ----------------------------     ------------------------------         PCT < 0.25 ng/mL                 PCT < 0.1 ng/mL     Stopping of antibiotics            Stopping of antibiotics       strongly encouraged.               strongly encouraged.    ----------------------------     ------------------------------       PCT level decrease by               PCT < 0.25 ng/mL       >= 80% from peak PCT       OR PCT 0.25 - 0.5 ng/mL          Stopping of antibiotics                                             encouraged.     Stopping of antibiotics           encouraged.    ----------------------------     ------------------------------       PCT level decrease by              PCT >= 0.25  ng/mL       < 80% from peak PCT        AND PCT >= 0.5 ng/mL             Continuing antibiotics  encouraged.       Continuing antibiotics            encouraged.    ----------------------------     ------------------------------     PCT level increase compared          PCT > 0.5 ng/mL         with peak PCT AND          PCT >= 0.5 ng/mL             Escalation of antibiotics                                          strongly encouraged.      Escalation of antibiotics        strongly encouraged.   CBC     Status: Abnormal   Collection Time: 11/22/16  4:05 AM  Result Value Ref Range   WBC 9.0 3.6 - 11.0 K/uL   RBC 2.69 (L) 3.80 - 5.20 MIL/uL   Hemoglobin 7.9 (L) 12.0 - 16.0 g/dL   HCT 24.1 (L) 35.0 - 47.0 %   MCV 89.4 80.0 - 100.0 fL   MCH 29.5 26.0 - 34.0 pg   MCHC 33.0 32.0 - 36.0 g/dL   RDW 16.1 (H) 11.5 - 14.5 %   Platelets 318 150 - 440 K/uL  Basic metabolic panel     Status: Abnormal   Collection Time: 11/22/16  4:05 AM  Result Value Ref Range   Sodium 138 135 - 145 mmol/L   Potassium 4.4 3.5 - 5.1 mmol/L   Chloride 104 101 - 111 mmol/L   CO2 30 22 - 32 mmol/L   Glucose, Bld 202 (H) 65 - 99 mg/dL   BUN 32 (H) 6 - 20 mg/dL   Creatinine, Ser 1.27 (H) 0.44 - 1.00 mg/dL   Calcium 8.1 (L) 8.9 - 10.3 mg/dL   GFR calc non Af Amer 39 (L) >60 mL/min   GFR calc Af Amer 45 (L) >60 mL/min    Comment: (NOTE) The eGFR has been calculated using the CKD EPI equation. This calculation has not been validated in all clinical situations. eGFR's persistently <60 mL/min signify possible Chronic Kidney Disease.    Anion gap 4 (L) 5 - 15  Glucose, capillary     Status: Abnormal   Collection Time: 11/22/16  4:23 AM  Result Value Ref Range   Glucose-Capillary 203 (H) 65 - 99 mg/dL   Comment 1 Notify RN   Glucose, capillary     Status: Abnormal   Collection Time: 11/22/16  7:40 AM  Result Value Ref Range   Glucose-Capillary 168 (H) 65 - 99 mg/dL    Comment 1 Notify RN   Glucose, capillary     Status: Abnormal   Collection Time: 11/22/16 11:37 AM  Result Value Ref Range   Glucose-Capillary 233 (H) 65 - 99 mg/dL   Comment 1 Notify RN    No components found for: ESR, C REACTIVE PROTEIN MICRO: Recent Results (from the past 720 hour(s))  Blood culture (routine x 2)     Status: None   Collection Time: 11/09/16  4:06 PM  Result Value Ref Range Status   Specimen Description BLOOD LEFT AC  Final   Special Requests   Final    BOTTLES DRAWN AEROBIC AND ANAEROBIC  AER 11CC ANA 10   Culture NO GROWTH 5  DAYS  Final   Report Status 11/14/2016 FINAL  Final  Blood culture (routine x 2)     Status: Abnormal   Collection Time: 11/09/16  4:17 PM  Result Value Ref Range Status   Specimen Description BLOOD  RIGHT WRIST  Final   Special Requests   Final    BOTTLES DRAWN AEROBIC AND ANAEROBIC  AER 12CC ANA10CC   Culture  Setup Time   Final    GRAM POSITIVE COCCI AEROBIC BOTTLE ONLY CRITICAL RESULT CALLED TO, READ BACK BY AND VERIFIED WITH: SCOTT [place] ON 11/10/16 AT 1327 QSD    Culture (A)  Final    ENTEROCOCCUS FAECALIS STAPHYLOCOCCUS SPECIES (COAGULASE NEGATIVE) THE SIGNIFICANCE OF ISOLATING THIS ORGANISM FROM A SINGLE SET OF BLOOD CULTURES WHEN MULTIPLE SETS ARE DRAWN IS UNCERTAIN. PLEASE NOTIFY THE MICROBIOLOGY DEPARTMENT WITHIN ONE WEEK IF SPECIATION AND SENSITIVITIES ARE REQUIRED. Performed at [facility]    Report Status 11/12/2016 FINAL  Final   Organism ID, Bacteria ENTEROCOCCUS FAECALIS  Final      Susceptibility   Enterococcus faecalis - MIC*    AMPICILLIN <=2 SENSITIVE Sensitive     VANCOMYCIN 1 SENSITIVE Sensitive     GENTAMICIN SYNERGY RESISTANT Resistant     * ENTEROCOCCUS FAECALIS  Blood Culture ID Panel (Reflexed)     Status: Abnormal   Collection Time: 11/09/16  4:17 PM  Result Value Ref Range Status   Enterococcus species DETECTED (A) NOT DETECTED Final    Comment: CRITICAL RESULT CALLED TO, READ BACK BY  AND VERIFIED WITH: SCOTT CHRISTY ON 11/10/16 AT 1327 QSD    Vancomycin resistance NOT DETECTED NOT DETECTED Final   Listeria monocytogenes NOT DETECTED NOT DETECTED Final   Staphylococcus species DETECTED (A) NOT DETECTED Final    Comment: CRITICAL RESULT CALLED TO, READ BACK BY AND VERIFIED WITH: SCOTT CHRISTY ON 11/10/16 AT 1327 QSD    Staphylococcus aureus NOT DETECTED NOT DETECTED Final   Methicillin resistance DETECTED (A) NOT DETECTED Final    Comment: CRITICAL RESULT CALLED TO, READ BACK BY AND VERIFIED WITH: SCOTT CHRISTY ON 11/10/16 AT 1327 QSD    Streptococcus species NOT DETECTED NOT DETECTED Final   Streptococcus agalactiae NOT DETECTED NOT DETECTED Final   Streptococcus pneumoniae NOT DETECTED NOT DETECTED Final   Streptococcus pyogenes NOT DETECTED NOT DETECTED Final   Acinetobacter baumannii NOT DETECTED NOT DETECTED Final   Enterobacteriaceae species NOT DETECTED NOT DETECTED Final   Enterobacter cloacae complex NOT DETECTED NOT DETECTED Final   Escherichia coli NOT DETECTED NOT DETECTED Final   Klebsiella oxytoca NOT DETECTED NOT DETECTED Final   Klebsiella pneumoniae NOT DETECTED NOT DETECTED Final   Proteus species NOT DETECTED NOT DETECTED Final   Serratia marcescens NOT DETECTED NOT DETECTED Final   Haemophilus influenzae NOT DETECTED NOT DETECTED Final   Neisseria meningitidis NOT DETECTED NOT DETECTED Final   Pseudomonas aeruginosa NOT DETECTED NOT DETECTED Final   Candida albicans NOT DETECTED NOT DETECTED Final   Candida glabrata NOT DETECTED NOT DETECTED Final   Candida krusei NOT DETECTED NOT DETECTED Final   Candida parapsilosis NOT DETECTED NOT DETECTED Final   Candida tropicalis NOT DETECTED NOT DETECTED Final  Culture, blood (single) w Reflex to ID Panel     Status: None   Collection Time: 11/14/16 11:37 AM  Result Value Ref Range Status   Specimen Description BLOOD RIGHT HAND  Final   Special Requests   Final    BOTTLES DRAWN AEROBIC AND  ANAEROBIC  AEROBIC 15CC,  ANAEROBIC 12CC   Culture NO GROWTH 5 DAYS  Final   Report Status 11/19/2016 FINAL  Final  Blood Culture (routine x 2)     Status: None (Preliminary result)   Collection Time: 11/20/16 10:03 PM  Result Value Ref Range Status   Specimen Description BLOOD LEFT FOREARM  Final   Special Requests BOTTLES DRAWN AEROBIC AND ANAEROBIC 6CCAERO,3CCANA  Final   Culture NO GROWTH 2 DAYS  Final   Report Status PENDING  Incomplete  Urine culture     Status: None   Collection Time: 11/20/16 10:03 PM  Result Value Ref Range Status   Specimen Description URINE, RANDOM  Final   Special Requests NONE  Final   Culture NO GROWTH Performed at [facility]   Final   Report Status 11/22/2016 FINAL  Final  Blood Culture (routine x 2)     Status: None (Preliminary result)   Collection Time: 11/20/16 10:40 PM  Result Value Ref Range Status   Specimen Description BLOOD BLOOD LEFT HAND  Final   Special Requests BOTTLES DRAWN AEROBIC AND ANAEROBIC [place]  Final   Culture NO GROWTH 2 DAYS  Final   Report Status PENDING  Incomplete  MRSA PCR Screening     Status: None   Collection Time: 11/21/16  2:38 AM  Result Value Ref Range Status   MRSA by PCR NEGATIVE NEGATIVE Final    Comment:        The GeneXpert MRSA Assay (FDA approved for NASAL specimens only), is one component of a comprehensive MRSA colonization surveillance program. It is not intended to diagnose MRSA infection nor to guide or monitor treatment for MRSA infections.     IMAGING: Ct Head Wo Contrast  Result Date: 11/09/2016 CLINICAL DATA:  Altered mental status with disorientation. Difficulty ambulating. Unwitnessed fall. EXAM: CT HEAD WITHOUT CONTRAST TECHNIQUE: Contiguous axial images were obtained from the base of the skull through the vertex without intravenous contrast. COMPARISON:  April 02, 2016 FINDINGS: Brain: Moderate diffuse atrophy is stable. There is no intracranial mass, hemorrhage,  extra-axial fluid collection, or midline shift. There is evidence of prior infarct in the medial left occipital lobe. The patient has had a prior infarct in the medial posterior right lentiform nucleus. There is evidence of prior lacunar type infarcts in each pons region, slightly larger on the left than on the right. There is small vessel disease throughout the centra semiovale bilaterally. No acute infarct is appreciable on this study. Vascular: There is no hyperdense vessel. There is calcification in each carotid siphon region. Skull: The bony calvarium appears intact. Sinuses/Orbits: There is mild mucosal thickening in several ethmoid air cells bilaterally. Visualized paranasal sinuses elsewhere clear. Visualized orbits appear symmetric bilaterally. Other: Visualized mastoid air cells are clear. IMPRESSION: Stable atrophy with scattered prior small infarcts as summarized above. There is periventricular small vessel disease in the centra semiovale bilaterally. No acute infarct evident. No mass, hemorrhage, or extra-axial fluid collection. There are foci of calcification in each carotid siphon region. There is mild ethmoid sinus disease bilaterally. Electronically Signed   By: Lowella Grip III M.D.   On: 11/09/2016 13:01   Mr Foot Left Wo Contrast  Result Date: 11/11/2016 CLINICAL DATA:  Diabetic with heel ulcer. Possible fracture of the first proximal phalanx on radiographs. Evaluate for osteomyelitis. History of peripheral neuropathy. EXAM: MRI OF THE LEFT FOOT WITHOUT CONTRAST TECHNIQUE: Multiplanar, multisequence MR imaging of the entire left foot was performed. No intravenous contrast was administered. COMPARISON:  Radiographs  11/09/2016. FINDINGS: Bones/Joint/Cartilage Bone detail limited by mild motion artifact and the large field-of-view utilized to encompass the entire foot. There is bone marrow edema and linear marrow signal within the proximal first phalanx, corresponding with the questioned  fracture on recent radiographs. No other fractures are identified. There is no bone destruction. Mild degenerative changes are present at the first metatarsal phalangeal joint. There are no other significant arthropathic changes or joint effusions. Ligaments Not relevant for exam/ indication. The Lisfranc ligament appears intact. Muscles and Tendons Mild T2 hyperintensity within the plantar foot musculature, likely from diabetic myopathy. Possible mild flexor tenosynovitis. The ankle tendons appear normal. Soft tissues Mild skin irregularity over the calcaneal tuberosity, corresponding with reported ulceration. No focal underlying fluid collection. IMPRESSION: 1. Nondisplaced fracture of the first proximal phalanx. 2. No evidence of osteomyelitis or soft tissue abscess. 3. 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 Electronically Signed   By: Richardean Sale M.D.   On: 11/11/2016 13:24   Dg Chest Port 1 View  Result Date: 11/21/2016 CLINICAL DATA:  82 year old female with pulmonary edema. Subsequent encounter. EXAM: PORTABLE CHEST 1 VIEW COMPARISON:  11/20/2016 chest x-ray. FINDINGS: Asymmetric airspace disease slightly greater on the left minimally changed from prior examination which may represent pulmonary edema with pleural effusions. Cannot exclude basilar infiltrate in the proper clinical setting. No gross pneumothorax. Right PICC line tip mid superior vena cava level. Heart size top-normal. IMPRESSION: Asymmetric airspace disease slightly greater on the left minimally changed from prior examination which may represent pulmonary edema with pleural effusions. Cannot exclude basilar infiltrate in the proper clinical setting. Electronically Signed   By: Genia Del M.D.   On: 11/21/2016 07:35   Dg Chest Port 1 View  Result Date: 11/20/2016 CLINICAL DATA:  Dyspnea.  Possible sepsis. EXAM: PORTABLE CHEST 1 VIEW COMPARISON:  11/14/2016 FINDINGS: Right upper  extremity PICC line remain satisfactorily positioned. Mild vascular and interstitial congestive changes have developed. Central and basilar hazy airspace opacities have developed. Probable small pleural effusions, right greater than left. IMPRESSION: The findings most likely represent mild congestive heart failure. Cannot exclude infectious infiltrates. Electronically Signed   By: Andreas Newport M.D.   On: 11/20/2016 23:22   Dg Chest Port 1 View  Result Date: 11/14/2016 CLINICAL DATA:  PICC line placement EXAM: PORTABLE CHEST 1 VIEW COMPARISON:  09/15/2016 FINDINGS: Cardiomediastinal silhouette is unremarkable. No infiltrate or pulmonary edema. There is right arm PICC line with tip in SVC right atrium junction. No pneumothorax. IMPRESSION: Right arm PICC line in place.  No pneumothorax. Electronically Signed   By: Lahoma Crocker M.D.   On: 11/14/2016 14:19   Dg Abd Portable 1v  Result Date: 11/11/2016 CLINICAL DATA:  Clearance for MRI.  Constipation. EXAM: PORTABLE ABDOMEN - 1 VIEW COMPARISON:  None. FINDINGS: The bowel gas pattern is normal. No radio-opaque calculi or other significant radiographic abnormality are seen. IMPRESSION: Negative. Electronically Signed   By: Fidela Salisbury M.D.   On: 11/11/2016 10:36   Dg Foot Complete Left  Result Date: 11/20/2016 CLINICAL DATA:  Possible sepsis.  Left foot ulcer. EXAM: LEFT FOOT - COMPLETE 3+ VIEW COMPARISON:  11/09/2016, 11/11/2016 FINDINGS: Negative for acute fracture or dislocation. No radiopaque foreign body. No bony destruction. No soft tissue gas. IMPRESSION: No acute findings. No radiographic evidence of osteomyelitis. No soft tissue gas. Electronically Signed   By: Andreas Newport M.D.   On: 11/20/2016 23:25   Dg Foot Complete Left  Result Date: 11/09/2016 CLINICAL  DATA:  Fall, heel pain and ulcer. EXAM: LEFT FOOT - COMPLETE 3+ VIEW COMPARISON:  None available FINDINGS: Severe osteopenia noted throughout the left foot. Peripheral  atherosclerosis present. Degenerative changes noted of the MTP joints and the interphalangeal joints compatible with osteoarthritis. Normal alignment. Linear lucency through the left great toe proximal phalanx, suspicious for a nondisplaced fracture. Recommend correlation for point tenderness in this region. Small plantar calcaneal spur.  Calcaneus appears intact. IMPRESSION: Left great toe proximal phalanx linear lucency concerning for subtle nondisplaced fracture. Recommend correlation for point tenderness in this region. Severe osteopenia and diffuse osteoarthritis Peripheral atherosclerosis Plantar calcaneal spur. Electronically Signed   By: Jerilynn Mages.  Shick M.D.   On: 11/09/2016 16:54    Assessment:   Lynnell Fiumara is a 82 y.o. female Jaimy McTillmanis a 82 y.o.femalewith PVD with diabetic heel ulcer and R ankle wound as well as enterococcal bacteremia. S/p revascularization 11/27 . Initially had wbc 14 and fever to 102 but improving.  MRI L foot neg for osteomyelitis. Was on ampicillin through picc but readmitted with resp failure.  Initial visit ESR 107, CRP 11.7. FU bcx negative 11/27 and from this admission 12/3 MRSA PCR neg  Recommendations I have cultured the L heel wound today- the R ankle looks dry and not infected She has had 14 days of IV since her initial admission so can stop IV abx at dc and send home on augmentin 875 bid for 10 days. I have dced vanco and zosyn and will start unasyn while she remains inpatient Can pull picc at [facility]  with vascular and podiatry.  She can follow up with podiatry and vascular and I can see if needed. Thank you very much for allowing me to participate in the care of this patient. Please call with questions.   Cheral Marker. Ola Spurr, MD

## 2016-11-23 DIAGNOSIS — Z794 Long term (current) use of insulin: Secondary | ICD-10-CM | POA: Diagnosis not present

## 2016-11-23 DIAGNOSIS — J45901 Unspecified asthma with (acute) exacerbation: Secondary | ICD-10-CM | POA: Diagnosis not present

## 2016-11-23 DIAGNOSIS — J449 Chronic obstructive pulmonary disease, unspecified: Secondary | ICD-10-CM | POA: Diagnosis not present

## 2016-11-23 DIAGNOSIS — D649 Anemia, unspecified: Secondary | ICD-10-CM | POA: Diagnosis not present

## 2016-11-23 DIAGNOSIS — E559 Vitamin D deficiency, unspecified: Secondary | ICD-10-CM | POA: Diagnosis not present

## 2016-11-23 DIAGNOSIS — E11621 Type 2 diabetes mellitus with foot ulcer: Secondary | ICD-10-CM | POA: Diagnosis not present

## 2016-11-23 DIAGNOSIS — N183 Chronic kidney disease, stage 3 (moderate): Secondary | ICD-10-CM | POA: Diagnosis not present

## 2016-11-23 DIAGNOSIS — Z8249 Family history of ischemic heart disease and other diseases of the circulatory system: Secondary | ICD-10-CM | POA: Diagnosis not present

## 2016-11-23 DIAGNOSIS — E785 Hyperlipidemia, unspecified: Secondary | ICD-10-CM | POA: Diagnosis present

## 2016-11-23 DIAGNOSIS — B952 Enterococcus as the cause of diseases classified elsewhere: Secondary | ICD-10-CM | POA: Diagnosis not present

## 2016-11-23 DIAGNOSIS — E1142 Type 2 diabetes mellitus with diabetic polyneuropathy: Secondary | ICD-10-CM | POA: Diagnosis not present

## 2016-11-23 DIAGNOSIS — Z79899 Other long term (current) drug therapy: Secondary | ICD-10-CM | POA: Diagnosis not present

## 2016-11-23 DIAGNOSIS — A499 Bacterial infection, unspecified: Secondary | ICD-10-CM | POA: Diagnosis not present

## 2016-11-23 DIAGNOSIS — J9621 Acute and chronic respiratory failure with hypoxia: Secondary | ICD-10-CM | POA: Diagnosis not present

## 2016-11-23 DIAGNOSIS — M6281 Muscle weakness (generalized): Secondary | ICD-10-CM | POA: Diagnosis not present

## 2016-11-23 DIAGNOSIS — I5033 Acute on chronic diastolic (congestive) heart failure: Secondary | ICD-10-CM | POA: Diagnosis not present

## 2016-11-23 DIAGNOSIS — I5020 Unspecified systolic (congestive) heart failure: Secondary | ICD-10-CM | POA: Diagnosis not present

## 2016-11-23 DIAGNOSIS — I10 Essential (primary) hypertension: Secondary | ICD-10-CM | POA: Diagnosis not present

## 2016-11-23 DIAGNOSIS — R42 Dizziness and giddiness: Secondary | ICD-10-CM | POA: Diagnosis not present

## 2016-11-23 DIAGNOSIS — J441 Chronic obstructive pulmonary disease with (acute) exacerbation: Secondary | ICD-10-CM | POA: Diagnosis not present

## 2016-11-23 DIAGNOSIS — E1140 Type 2 diabetes mellitus with diabetic neuropathy, unspecified: Secondary | ICD-10-CM | POA: Diagnosis not present

## 2016-11-23 DIAGNOSIS — I130 Hypertensive heart and chronic kidney disease with heart failure and stage 1 through stage 4 chronic kidney disease, or unspecified chronic kidney disease: Secondary | ICD-10-CM | POA: Diagnosis not present

## 2016-11-23 DIAGNOSIS — Z5189 Encounter for other specified aftercare: Secondary | ICD-10-CM | POA: Diagnosis not present

## 2016-11-23 DIAGNOSIS — L89510 Pressure ulcer of right ankle, unstageable: Secondary | ICD-10-CM | POA: Diagnosis not present

## 2016-11-23 DIAGNOSIS — Z823 Family history of stroke: Secondary | ICD-10-CM | POA: Diagnosis not present

## 2016-11-23 DIAGNOSIS — R7881 Bacteremia: Secondary | ICD-10-CM | POA: Diagnosis not present

## 2016-11-23 DIAGNOSIS — Z9181 History of falling: Secondary | ICD-10-CM | POA: Diagnosis not present

## 2016-11-23 DIAGNOSIS — Z87891 Personal history of nicotine dependence: Secondary | ICD-10-CM | POA: Diagnosis not present

## 2016-11-23 DIAGNOSIS — L97529 Non-pressure chronic ulcer of other part of left foot with unspecified severity: Secondary | ICD-10-CM | POA: Diagnosis not present

## 2016-11-23 DIAGNOSIS — Z7902 Long term (current) use of antithrombotics/antiplatelets: Secondary | ICD-10-CM | POA: Diagnosis not present

## 2016-11-23 DIAGNOSIS — I5023 Acute on chronic systolic (congestive) heart failure: Secondary | ICD-10-CM | POA: Diagnosis not present

## 2016-11-23 DIAGNOSIS — J9601 Acute respiratory failure with hypoxia: Secondary | ICD-10-CM | POA: Diagnosis not present

## 2016-11-23 DIAGNOSIS — Z8673 Personal history of transient ischemic attack (TIA), and cerebral infarction without residual deficits: Secondary | ICD-10-CM | POA: Diagnosis not present

## 2016-11-23 DIAGNOSIS — R0602 Shortness of breath: Secondary | ICD-10-CM | POA: Diagnosis not present

## 2016-11-23 DIAGNOSIS — E119 Type 2 diabetes mellitus without complications: Secondary | ICD-10-CM | POA: Diagnosis not present

## 2016-11-23 DIAGNOSIS — E1122 Type 2 diabetes mellitus with diabetic chronic kidney disease: Secondary | ICD-10-CM | POA: Diagnosis not present

## 2016-11-23 DIAGNOSIS — I739 Peripheral vascular disease, unspecified: Secondary | ICD-10-CM | POA: Diagnosis not present

## 2016-11-23 DIAGNOSIS — I248 Other forms of acute ischemic heart disease: Secondary | ICD-10-CM | POA: Diagnosis not present

## 2016-11-23 DIAGNOSIS — L89629 Pressure ulcer of left heel, unspecified stage: Secondary | ICD-10-CM | POA: Diagnosis not present

## 2016-11-23 DIAGNOSIS — I509 Heart failure, unspecified: Secondary | ICD-10-CM | POA: Diagnosis not present

## 2016-11-23 DIAGNOSIS — Z7982 Long term (current) use of aspirin: Secondary | ICD-10-CM | POA: Diagnosis not present

## 2016-11-23 DIAGNOSIS — R4182 Altered mental status, unspecified: Secondary | ICD-10-CM | POA: Diagnosis not present

## 2016-11-23 DIAGNOSIS — Z7401 Bed confinement status: Secondary | ICD-10-CM | POA: Diagnosis not present

## 2016-11-23 LAB — BASIC METABOLIC PANEL
Anion gap: 5 (ref 5–15)
BUN: 34 mg/dL — AB (ref 6–20)
CHLORIDE: 103 mmol/L (ref 101–111)
CO2: 31 mmol/L (ref 22–32)
CREATININE: 1.22 mg/dL — AB (ref 0.44–1.00)
Calcium: 8.1 mg/dL — ABNORMAL LOW (ref 8.9–10.3)
GFR calc non Af Amer: 41 mL/min — ABNORMAL LOW (ref >60)
GFR, EST AFRICAN AMERICAN: 47 mL/min — AB (ref >60)
GLUCOSE: 122 mg/dL — AB (ref 65–99)
Potassium: 4.2 mmol/L (ref 3.5–5.1)
Sodium: 139 mmol/L (ref 135–145)

## 2016-11-23 LAB — CBC
HCT: 25.2 % — ABNORMAL LOW (ref 35.0–47.0)
Hemoglobin: 8.3 g/dL — ABNORMAL LOW (ref 12.0–16.0)
MCH: 29.5 pg (ref 26.0–34.0)
MCHC: 33.1 g/dL (ref 32.0–36.0)
MCV: 89.1 fL (ref 80.0–100.0)
PLATELETS: 326 10*3/uL (ref 150–440)
RBC: 2.83 MIL/uL — AB (ref 3.80–5.20)
RDW: 16.2 % — ABNORMAL HIGH (ref 11.5–14.5)
WBC: 6.4 10*3/uL (ref 3.6–11.0)

## 2016-11-23 LAB — GLUCOSE, CAPILLARY
Glucose-Capillary: 67 mg/dL (ref 65–99)
Glucose-Capillary: 123 mg/dL — ABNORMAL HIGH (ref 65–99)
Glucose-Capillary: 111 mg/dL — ABNORMAL HIGH (ref 65–99)
Glucose-Capillary: 228 mg/dL — ABNORMAL HIGH (ref 65–99)
Glucose-Capillary: 157 mg/dL — ABNORMAL HIGH (ref 65–99)

## 2016-11-23 LAB — PROCALCITONIN: Procalcitonin: 1.23 ng/mL

## 2016-11-23 MED ORDER — FLUTICASONE-SALMETEROL 115-21 MCG/ACT IN AERO: 2.0000 | INHALATION_SPRAY | Freq: Two times a day (BID) | RESPIRATORY_TRACT | 0 refills | Status: AC

## 2016-11-23 MED ORDER — ACETAMINOPHEN 325 MG PO TABS: 650.0000 mg | ORAL_TABLET | Freq: Four times a day (QID) | ORAL | Status: AC | PRN

## 2016-11-23 MED ORDER — PREDNISONE 10 MG (21) PO TBPK: 10.0000 mg | ORAL_TABLET | Freq: Every day | ORAL | 0 refills | Status: DC

## 2016-11-23 MED ORDER — FLEET ENEMA 7-19 GM/118ML RE ENEM
1.0000 | ENEMA | Freq: Once | RECTAL | Status: AC
  Administered 2016-11-23: 1 via RECTAL

## 2016-11-23 MED ORDER — ALBUTEROL SULFATE HFA 108 (90 BASE) MCG/ACT IN AERS: 2.0000 | INHALATION_SPRAY | Freq: Four times a day (QID) | RESPIRATORY_TRACT | 1 refills | Status: AC | PRN

## 2016-11-23 MED ORDER — INSULIN GLARGINE 100 UNIT/ML ~~LOC~~ SOLN: 20.0000 [IU] | Freq: Every day | SUBCUTANEOUS | 3 refills | Status: DC

## 2016-11-23 MED ORDER — LACTULOSE 10 GM/15ML PO SOLN
20.0000 g | Freq: Once | ORAL | Status: AC
  Administered 2016-11-23: 20 g via ORAL
  Filled 2016-11-23: qty 30

## 2016-11-23 MED ORDER — AMOXICILLIN-POT CLAVULANATE 875-125 MG PO TABS: 1.0000 | ORAL_TABLET | Freq: Two times a day (BID) | ORAL | 0 refills | Status: AC

## 2016-11-23 MED ORDER — INSULIN ASPART 100 UNIT/ML ~~LOC~~ SOLN: 0.0000 [IU] | Freq: Three times a day (TID) | SUBCUTANEOUS | 0 refills | Status: AC

## 2016-11-23 MED ORDER — FUROSEMIDE 20 MG PO TABS: 20.0000 mg | ORAL_TABLET | Freq: Two times a day (BID) | ORAL | 0 refills | Status: AC

## 2016-11-23 NOTE — Progress Notes (Signed)
11/23/2016  5:35 PM  Called report to receiving nurse Misty StanleyLisa at [facility] where [facility] is discharging.  Bradly Chrisougherty, Jadamarie Butson E, RN

## 2016-11-23 NOTE — Care Management (Signed)
Attempted to reach daughter to see if she could provide transport.  Message left.  Per bedside nurse, patient niece aware that patient is for discharge today and will also attempt to reach patients daughter. EMS transport  form completed and placed on patient's chart.

## 2016-11-23 NOTE — Evaluation (Signed)
Physical Therapy Evaluation Patient Details Name: [name] [name] MRN: [id number] DOB: October 22, 1934 Today's Date: 11/23/2016   History of Present Illness  presented to ER from STR secondary to respiratory distress, clogged PICC; admitted with acute COPD exacerbation, pulmonary edema.  Initially requiring BiPAP; now, weaned to RA.  Patient status post R LE angiogram with stent placement 12/4 for revascularization.  Clinical Impression  Upon evaluation, patient alert and oriented to basic information; intermittently confused with difficulty recalling details, answering questions regarding PLOF.  Bilat UE/LE strength and ROM grossly WFL for basic transfers and mobility; residual L hemiparesis evident (3-/5 throughout).  Currently requiring mod/max assist +2 for bed mobility; mod assist +2 for sit/stand and forward/backward stepping (4 steps) with RW.  Very poor standing balance, constant assist to prevent posterior LOB/fall.  Unsafe to attempt without RW and +1-2 at all times. Would benefit from skilled PT to address above deficits and promote optimal return to PLOF; recommend transition to STR upon discharge from acute hospitalization.     Follow Up Recommendations SNF    Equipment Recommendations       Recommendations for Other Services       Precautions / Restrictions        Mobility  Bed Mobility Overal bed mobility: Needs Assistance Bed Mobility: Supine to Sit;Sit to Supine     Supine to sit: Mod assist Sit to supine: Max assist;+2 for physical assistance      Transfers Overall transfer level: Needs assistance Equipment used: Rolling walker (2 wheeled) Transfers: Sit to/from Stand Sit to Stand: Mod assist;+2 physical assistance         General transfer comment: assist for lift off, standing balance  Ambulation/Gait Ambulation/Gait assistance: Mod assist;+2 physical assistance Ambulation Distance (Feet): 4 Feet Assistive device: Rolling walker (2 wheeled)        General Gait Details: step to gait pattern with poor foot clearance, step height/length L > R; poor standing balance with progressive forward trunk flexion during gait efforts.  Constant assist to prevent posterior LOB.  Stairs            Wheelchair Mobility    Modified Rankin (Stroke Patients Only)       Balance Overall balance assessment: Needs assistance Sitting-balance support: No upper extremity supported;Feet supported Sitting balance-Leahy Scale: Fair     Standing balance support: Bilateral upper extremity supported Standing balance-Leahy Scale: Poor                               Pertinent Vitals/Pain Pain Assessment: No/denies pain    Home Living Family/patient expects to be discharged to:: Skilled nursing facility Living Arrangements: Children Available Help at Discharge: Family Type of Home: House Home Access: Stairs to enter Entrance Stairs-Rails: Right Entrance Stairs-Number of Steps: 4 Home Layout: One level        Prior Function           Comments: Per patient, mod indep with SPC at baseline     Hand Dominance        Extremity/Trunk Assessment   Upper Extremity Assessment: Generalized weakness (grossly at least 3/5 throughout; L UE with baseline hemiparesis 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  Baseline sensory deficit mid-calf distally.  Chronic wounds to L heel, R lateral malleolus)  Communication   Communication: No difficulties  Cognition Arousal/Alertness: Awake/alert Behavior During Therapy: WFL for tasks assessed/performed Overall Cognitive Status: Difficult to assess (questionable confusion-difficulty answering questions and providing detail regarding PLOF)                      General Comments      Exercises Other Exercises Other Exercises: Alternate LE forward/backward stepping with RW, mod  assist +2 for standing balance--emphasis on step height/length, midline awareness and postural extension.  good ability to isolate LE limb advancement with targeted therex, but unable to integrate into functional activity.   Assessment/Plan    PT Assessment Patient needs continued PT services  PT Problem List Decreased strength;Decreased range of motion;Decreased activity tolerance;Decreased mobility;Decreased balance;Decreased coordination;Decreased cognition;Decreased knowledge of use of DME;Decreased safety awareness;Decreased knowledge of precautions;Cardiopulmonary status limiting activity;Obesity;Decreased skin integrity;Pain          PT Treatment Interventions DME instruction;Gait training;Stair training;Functional mobility training;Therapeutic activities;Therapeutic exercise;Patient/family education;Balance training    PT Goals (Current goals can be found in the Care Plan section)  Acute Rehab PT Goals Patient Stated Goal: to be able to walk again PT Goal Formulation: With patient Time For Goal Achievement: 12/07/16 Potential to Achieve Goals: Fair    Frequency Min 2X/week   Barriers to discharge Inaccessible home environment;Decreased caregiver support      Co-evaluation               End of Session Equipment Utilized During Treatment: Gait belt Activity Tolerance: Patient tolerated treatment well Patient left: in bed;with call bell/phone within reach;with bed alarm set Nurse Communication: Mobility status         Time: [id number] PT Time Calculation (min) (ACUTE ONLY): 19 min   Charges:   PT Evaluation $PT Eval Moderate Complexity: 1 Procedure PT Treatments $Therapeutic Activity: 8-22 mins   PT G Codes:        Arnita Koons H. Manson PasseyBrown, PT, DPT, NCS 11/23/16, 2:41 PM [phone]

## 2016-11-23 NOTE — Clinical Social Work Note (Signed)
CSW to discharge today to return to [facility]. Discharge information sent and Verlon AuLeslie at [facility] is aware. Patient does not need to transport via EMS. CSW has left message for her daughter to see if she could transport. CSW has spoken to Amy at [facility] and she verbally told me that patient is authorized to return to [facility]. CSW awaiting the auth number. York SpanielMonica Cherylin [name] MSW,LCSW [phone]

## 2016-11-23 NOTE — Progress Notes (Signed)
Pt CBG 67 this am. Given orange juice, CBG 123. Will continue to assess.

## 2016-11-23 NOTE — Care Management Important Message (Signed)
Important Message  Patient Details  Name: Frederich Chickrnell Skarzynski MRN: [id number] Date of Birth: 08/30/1934   Medicare Important Message Given:  Yes    Chapman FitchBOWEN, Johnnie Moten T, RN 11/23/2016, 2:09 PM

## 2016-11-23 NOTE — Progress Notes (Signed)
Inpatient Diabetes Program Recommendations  AACE/ADA: New Consensus Statement on Inpatient Glycemic Control (2015)  Target Ranges:  Prepandial:   less than 140 mg/dL      Peak postprandial:   less than 180 mg/dL (1-2 hours)      Critically ill patients:  140 - 180 mg/dL   Lab Results  Component Value Date   GLUCAP 228 (H) 11/23/2016   HGBA1C 8.6 (H) 11/11/2016    Review of Glycemic Control:  Results for [name] [name], [name] (MRN [id number]) as of 11/23/2016 12:59  Ref. Range 11/22/2016 16:50 11/22/2016 20:10 11/22/2016 23:34 11/23/2016 04:25 11/23/2016 05:52 11/23/2016 07:59 11/23/2016 12:01  Glucose-Capillary Latest Ref Range: 65 - 99 mg/dL 086193 (H) 578230 (H) 469156 (H) 67 123 (H) 111 (H) 228 (H)   Diabetes history: Type 2 diabetes Outpatient Diabetes medications: 70/30 mix 38 units with breakfast and 15 units with supper Current orders for Inpatient glycemic control:  Lantus 20 units q HS, Novolog moderate q 4 hours  Inpatient Diabetes Program Recommendations: Consider decreasing frequency of Novolog correction to tid with meals and use HS scale at bedtime (starts at 201 mg/dL).  Text page sent to MD. Thanks, Beryl MeagerJenny Justen Fonda, RN, BC-ADM Inpatient Diabetes Coordinator Pager [phone] (8a-5p)

## 2016-11-23 NOTE — Progress Notes (Signed)
11/23/2016 6:12 PM  [name] [name] to be D/C'd Skilled nursing facility per MD order.  Discussed prescriptions and follow up appointments with the patient. Prescriptions given to patient, medication list explained in detail. Pt verbalized understanding.    Medication List    STOP taking these medications   ampicillin 2 g in sodium chloride 0.9 % 50 mL   insulin aspart protamine- aspart (70-30) 100 UNIT/ML injection Commonly known as:  NOVOLOG MIX 70/30     TAKE these medications   acetaminophen 325 MG tablet Commonly known as:  TYLENOL Take 2 tablets (650 mg total) by mouth every 6 (six) hours as needed for mild pain (temp > 101.5).   albuterol 108 (90 Base) MCG/ACT inhaler Commonly known as:  PROVENTIL HFA;VENTOLIN HFA Inhale 2 puffs into the lungs every 6 (six) hours as needed for wheezing or shortness of breath.   amoxicillin-clavulanate 875-125 MG tablet Commonly known as:  AUGMENTIN Take 1 tablet by mouth 2 (two) times daily.   aspirin EC 81 MG tablet Take 1 tablet by mouth 1 day or 1 dose.   atorvastatin 10 MG tablet Commonly known as:  LIPITOR Take 1 tablet by mouth at bedtime.   carvedilol 3.125 MG tablet Commonly known as:  COREG Take 1 tablet (3.125 mg total) by mouth 2 (two) times daily with a meal.   clopidogrel 75 MG tablet Commonly known as:  PLAVIX Take 1 tablet (75 mg total) by mouth daily.   collagenase ointment Commonly known as:  SANTYL Apply topically daily.   diphenhydrAMINE 25 mg capsule Commonly known as:  BENADRYL Take 25 mg by mouth every 6 (six) hours as needed for itching.   fluticasone-salmeterol 115-21 MCG/ACT inhaler Commonly known as:  ADVAIR HFA Inhale 2 puffs into the lungs 2 (two) times daily.   furosemide 20 MG tablet Commonly known as:  LASIX Take 1 tablet (20 mg total) by mouth 2 (two) times daily. What changed:  when to take this   gabapentin 100 MG capsule Commonly known as:  NEURONTIN Take 2 capsules by mouth 2  (two) times daily. Take 2 capsules (200mg ) twice a day   insulin aspart 100 UNIT/ML injection Commonly known as:  novoLOG Inject 0-15 Units into the skin 3 (three) times daily with meals.   insulin glargine 100 UNIT/ML injection Commonly known as:  LANTUS Inject 0.2 mLs (20 Units total) into the skin at bedtime.   lisinopril 2.5 MG tablet Commonly known as:  PRINIVIL,ZESTRIL Take 1 tablet (2.5 mg total) by mouth daily.   loratadine 10 MG tablet Commonly known as:  CLARITIN Take 10 mg by mouth daily.   potassium chloride SA 20 MEQ tablet Commonly known as:  K-DUR,KLOR-CON Take 20 mEq by mouth daily.   predniSONE 10 MG (21) Tbpk tablet Commonly known as:  STERAPRED UNI-PAK 21 TAB Take 1 tablet (10 mg total) by mouth daily. Take 6 tablets by mouth for 1 day followed by  5 tablets by mouth for 1 day followed by  4 tablets by mouth for 1 day followed by  3 tablets by mouth for 1 day followed by  2 tablets by mouth for 1 day followed by  1 tablet by mouth for a day and stop   Vitamin D (Ergocalciferol) 50000 units Caps capsule Commonly known as:  DRISDOL Take 1 capsule by mouth once a week.       Vitals:   11/23/16 1232 11/23/16 1302  BP: (!) 147/56   Pulse: 73 72  Resp:  18   Temp: 98.5 F (36.9 C)     Skin clean, dry and intact without evidence of skin break down, no evidence of skin tears noted. IV catheter discontinued intact. Site without signs and symptoms of complications. Dressing and pressure applied. Pt denies pain at this time. No complaints noted.  An After Visit Summary was printed and given to the patient. Patient escorted, and D/C to SNF via private auto.  Bradly Chrisougherty, Tyneshia Stivers E

## 2016-11-23 NOTE — Progress Notes (Signed)
[place] INFECTIOUS DISEASE PROGRESS NOTE Date of Admission:  11/20/2016     ID: [name] [name] is a 82 y.o. female with L heel ulcer, enterococcal bacteremia Active Problems:   Acute respiratory distress   Subjective: No fevers, breathing improved  ROS  Eleven systems are reviewed and negative except per hpi  Medications:  Antibiotics Given (last 72 hours)    Date/Time Action Medication Dose Rate   11/21/16 0545 Given   piperacillin-tazobactam (ZOSYN) IVPB 4.5 g 4.5 g 25 mL/hr   11/21/16 1024 Given   cefUROXime (ZINACEF) 1.5 g in dextrose 5 % 50 mL IVPB 1.5 g 100 mL/hr   11/21/16 1422 Given   piperacillin-tazobactam (ZOSYN) IVPB 4.5 g 4.5 g 25 mL/hr   11/21/16 2330 Given  [med unavailable at scheduled time]   piperacillin-tazobactam (ZOSYN) IVPB 4.5 g 4.5 g 25 mL/hr   11/22/16 0500 Given   piperacillin-tazobactam (ZOSYN) IVPB 4.5 g 4.5 g 25 mL/hr   11/22/16 1401 Given   vancomycin (VANCOCIN) IVPB 750 mg/150 ml premix 750 mg 150 mL/hr   11/22/16 1816 Given   Ampicillin-Sulbactam (UNASYN) 3 g in sodium chloride 0.9 % 100 mL IVPB 3 g 200 mL/hr   11/22/16 2223 Given   Ampicillin-Sulbactam (UNASYN) 3 g in sodium chloride 0.9 % 100 mL IVPB 3 g 200 mL/hr   11/23/16 0444 Given   Ampicillin-Sulbactam (UNASYN) 3 g in sodium chloride 0.9 % 100 mL IVPB 3 g 200 mL/hr   11/23/16 0934 Given   Ampicillin-Sulbactam (UNASYN) 3 g in sodium chloride 0.9 % 100 mL IVPB 3 g 200 mL/hr     . ampicillin-sulbactam (UNASYN) IV  3 g Intravenous Q6H  . aspirin EC  81 mg Oral 1 day or 1 dose  . atorvastatin  10 mg Oral QHS  . carvedilol  3.125 mg Oral BID WC  . chlorhexidine  15 mL Mouth Rinse BID  . clopidogrel  75 mg Oral Daily  . furosemide  40 mg Intravenous Daily  . heparin  5,000 Units Subcutaneous Q8H  . insulin aspart  0-15 Units Subcutaneous Q4H  . insulin glargine  20 Units Subcutaneous QHS  . ipratropium-albuterol  3 mL Nebulization Q6H  . lisinopril  2.5 mg Oral Daily  .  loratadine  10 mg Oral Daily  . mouth rinse  15 mL Mouth Rinse q12n4p  . sodium chloride flush  10-40 mL Intracatheter Q12H  . sodium phosphate  1 enema Rectal Once    Objective: Vital signs in last 24 hours: Temp:  [98.2 F (36.8 C)-98.5 F (36.9 C)] 98.5 F (36.9 C) (12/06 1232) Pulse Rate:  [64-74] 72 (12/06 1302) Resp:  [18-24] 18 (12/06 1232) BP: (109-154)/(45-73) 147/56 (12/06 1232) SpO2:  [94 %-100 %] 97 % (12/06 1302) Weight:  [78.1 kg (172 lb 1.6 oz)] 78.1 kg (172 lb 1.6 oz) (12/06 0445) Constitutional: frail, disoriented HENT: Divide/AT, PERRLA, no scleral icterus Mouth/Throat: Oropharynx is clear and moist. No oropharyngeal exudate.  Cardiovascular: Normal rate, regular rhythm and normal heart sounds. Pulmonary/Chest: Effort normal and breath sounds normal. No respiratory distress. has no wheezes.  Neck = supple, no nuchal rigidity Abdominal: Soft. Bowel sounds are normal. exhibits no distension. There is no tenderness.  Lymphadenopathy: no cervical adenopathy. No axillary adenopathy Ext no cce Neurological: disoriented  Skin: L heel with ulceration, dark eschar which is peeling in spots, has odor, mild surrounding erythema R lateral malleous with a heaped up wound  But this is quite dry, nontender Psychiatric: a normal mood  and affect. behavior is normal.    Lab Results  Recent Labs  11/22/16 0405 11/23/16 0354 11/23/16 1100  WBC 9.0  --  6.4  HGB 7.9*  --  8.3*  HCT 24.1*  --  25.2*  NA 138 139  --   K 4.4 4.2  --   CL 104 103  --   CO2 30 31  --   BUN 32* 34*  --   CREATININE 1.27* 1.22*  --     Microbiology: Results for orders placed or performed during the hospital encounter of 11/20/16  Blood Culture (routine x 2)     Status: None (Preliminary result)   Collection Time: 11/20/16 10:03 PM  Result Value Ref Range Status   Specimen Description BLOOD LEFT FOREARM  Final   Special Requests BOTTLES DRAWN AEROBIC AND ANAEROBIC 6CCAERO,3CCANA  Final    Culture NO GROWTH 3 DAYS  Final   Report Status PENDING  Incomplete  Urine culture     Status: None   Collection Time: 11/20/16 10:03 PM  Result Value Ref Range Status   Specimen Description URINE, RANDOM  Final   Special Requests NONE  Final   Culture NO GROWTH Performed at [facility]   Final   Report Status 11/22/2016 FINAL  Final  Blood Culture (routine x 2)     Status: None (Preliminary result)   Collection Time: 11/20/16 10:40 PM  Result Value Ref Range Status   Specimen Description BLOOD BLOOD LEFT HAND  Final   Special Requests BOTTLES DRAWN AEROBIC AND ANAEROBIC AER6CC,ANA4CC  Final   Culture NO GROWTH 3 DAYS  Final   Report Status PENDING  Incomplete  MRSA PCR Screening     Status: None   Collection Time: 11/21/16  2:38 AM  Result Value Ref Range Status   MRSA by PCR NEGATIVE NEGATIVE Final    Comment:        The GeneXpert MRSA Assay (FDA approved for NASAL specimens only), is one component of a comprehensive MRSA colonization surveillance program. It is not intended to diagnose MRSA infection nor to guide or monitor treatment for MRSA infections.   Aerobic Culture (superficial specimen)     Status: None (Preliminary result)   Collection Time: 11/22/16  2:16 PM  Result Value Ref Range Status   Specimen Description HEEL  Final   Special Requests Normal  Final   Gram Stain   Final    RARE WBC PRESENT, PREDOMINANTLY MONONUCLEAR ABUNDANT GRAM NEGATIVE RODS ABUNDANT GRAM POSITIVE RODS MODERATE GRAM POSITIVE COCCI    Culture   Final    CULTURE REINCUBATED FOR BETTER GROWTH Performed at [facility]    Report Status PENDING  Incomplete    Studies/Results: No results found.  Assessment/Plan: [name] [name] is a 82 y.o. female [name] [name] a 82 y.o.femalewith PVD with diabetic heel ulcer and R ankle wound as well as enterococcal bacteremia. S/[name] revascularization 11/27 . MRI L foot neg for osteomyelitis. Was on ampicillin through picc  but readmitted with resp failure.  Initial visit -ESR 107, CRP 11.7. FU bcx negative 11/27 and from this admission 12/3 neg MRSA PCR neg. Wound cx pending but mixed for now.  The R heel is having some desquamation and the L ankle appears dry and not infected  Recommendations She has had 14 days of IV since her initial admission so can stop IV abx at dc and send home on augmentin 875 bid for 10 days (as long as wound cx does  not grow resistant organisms) Can pull picc at DC I will follow up on culture if dced prior to completion  Continue outpatient fu with vascular and podiatry.  Thank you very much for the consult. Will follow with you.  [name] [name]   11/23/2016, 2:21 PM

## 2016-11-23 NOTE — Discharge Summary (Signed)
[facility] [place] at [facility]   PATIENT NAME: [name] [name]    MR#:  [id number]  DATE OF BIRTH:  Jul 24, 1934  DATE OF ADMISSION:  11/20/2016 ADMITTING PHYSICIAN: Erin Fulling, MD  DATE OF DISCHARGE: 11/23/2016  PRIMARY CARE PHYSICIAN: Barbette Reichmann, MD    ADMISSION DIAGNOSIS:  Infected wound [T14.8XXA, L08.9] Cellulitis of left lower extremity [L03.116] Acute respiratory failure with hypoxia and hypercapnia (HCC) [J96.01, J96.02]  DISCHARGE DIAGNOSIS:  Active Problems:   Acute respiratory distress COPD exacerbation Acute diastolic congestive heart failure Peripheral vascular disease Left heel ulcer Diabetes mellitus-insulin requiring CVA  SECONDARY DIAGNOSIS:   Past Medical History:  Diagnosis Date  . Diabetes mellitus without complication (HCC)   . Hypertension   . Stroke [facility])     HOSPITAL COURSE:   This is an 82 year old female who presented to the ED via EMS with complaints of acute respiratory distress that started this evening. Patient was in the ED at this morning with a clogged PICC line. Her PICC line was declogged and she was discharged back to the nursing home. She was being treated at the nursing home with IV antibiotics and IV fluids for enterococcus faecalis bacteremia. This evening, patient became acutely short of breath hence EMS was called. Upon EMS arrival, patient was hypoxic, and some With an SPO2 of 50%. She was placed on BiPAP and transferred to the ED. Her SPO2 improved up to 90%. She is currently on BiPAP and reports mild improvement in dyspnea. Her chest x-ray shows pulmonary edema, labs show mild hypokalemia, lactic acid level is normal and her WBC is normal. She denies chest pain, palpitations, nausea, vomiting, diarrhea, headaches, but reports dyspnea which has improved compared to when she first got to the emergency room. Despite being on BiPAP, patient is still tachypneic with respiratory rates in the 30s, hence  PCCM was called to admit. Please review history and physical for complete details. Patient was extubated and placed on BiPAP and transferred to the floor. Please review progress note and consult note for complete details    #1 acute respiratory distress-secondary to acute on chronic COPD exacerbation and also acute diastolic failure. -Echo with normal ejection fraction. Currently on Lasix IV, will change her to by mouth Lasix. Much improved breathing. - off Bipap now -Chest x-ray with mild pulmonary edema and questionable infiltrate. Patient on antibiotics. -Continue steroids tapering dose Change to oral prednisone taper at discharge. -Continue nebulizer treatments.  #2 peripheral vascular disease with left heel ulcer-MRI negative for osteomyelitis. Seen by vascular team. -Status post left lower extremity angiogram and stenting done on 417 -IV antibiotics changed from ampicillin to Unasyn here in the hospital. Appreciate ID input. -Discharge on Augmentin 875 mg by mouth twice a day for 10 more days -PICC line will be discontinued at discharge  #3 diabetes mellitus-70/30 insulin at home. Currently on Lantus will discharge her home with Lantus 20 units every 24 hours as recommended by diabetic coordinator  Monitor blood sugars. Appreciate diabetes coordinator input  #4 history of CVA-new Plavix, statin. Stable Outpatient neurology follow-up  #5 anemia-baseline hemoglobin seems to be around 9 Serial hemoglobin 9-7.9-8.3.  Status post vascular angiogram and stent placement on 11/21/2016 done  -No acute indication for transfusion at this time.primary care physician to monitor CBC. No active bleeding noted.  #6 DVT prophylaxis-on subcutaneous heparin during hospital course  #7 left heel ulcer status post cultures vision will be continued on Augmentin for 10 days. Outpatient follow-up with podiatry and vascular  surgery as recommended DISCHARGE CONDITIONS:  Fair  CONSULTS OBTAINED:   Treatment Team:  Mick Sell, MD Vascular surgery  PROCEDURES Left lower extremity angiogram and stent placement on 11/21/2016  DRUG ALLERGIES:  No Known Allergies  DISCHARGE MEDICATIONS:   Current Discharge Medication List    START taking these medications   Details  acetaminophen (TYLENOL) 325 MG tablet Take 2 tablets (650 mg total) by mouth every 6 (six) hours as needed for mild pain (temp > 101.5).    albuterol (PROVENTIL HFA;VENTOLIN HFA) 108 (90 Base) MCG/ACT inhaler Inhale 2 puffs into the lungs every 6 (six) hours as needed for wheezing or shortness of breath. Qty: 1 Inhaler, Refills: 1    amoxicillin-clavulanate (AUGMENTIN) 875-125 MG tablet Take 1 tablet by mouth 2 (two) times daily. Qty: 20 tablet, Refills: 0    fluticasone-salmeterol (ADVAIR HFA) 115-21 MCG/ACT inhaler Inhale 2 puffs into the lungs 2 (two) times daily. Qty: 1 Inhaler, Refills: 0    insulin aspart (NOVOLOG) 100 UNIT/ML injection Inject 0-15 Units into the skin 3 (three) times daily with meals. Qty: 100 mL, Refills: 0    insulin glargine (LANTUS) 100 UNIT/ML injection Inject 0.2 mLs (20 Units total) into the skin at bedtime. Qty: 10 mL, Refills: 3    predniSONE (STERAPRED UNI-PAK 21 TAB) 10 MG (21) TBPK tablet Take 1 tablet (10 mg total) by mouth daily. Take 6 tablets by mouth for 1 day followed by  5 tablets by mouth for 1 day followed by  4 tablets by mouth for 1 day followed by  3 tablets by mouth for 1 day followed by  2 tablets by mouth for 1 day followed by  1 tablet by mouth for a day and stop Qty: 21 tablet, Refills: 0      CONTINUE these medications which have CHANGED   Details  furosemide (LASIX) 20 MG tablet Take 1 tablet (20 mg total) by mouth 2 (two) times daily. Qty: 60 tablet, Refills: 0      CONTINUE these medications which have NOT CHANGED   Details  aspirin EC 81 MG tablet Take 1 tablet by mouth 1 day or 1 dose.    atorvastatin (LIPITOR) 10 MG tablet Take 1  tablet by mouth at bedtime.     carvedilol (COREG) 3.125 MG tablet Take 1 tablet (3.125 mg total) by mouth 2 (two) times daily with a meal. Qty: 30 tablet, Refills: 0    clopidogrel (PLAVIX) 75 MG tablet Take 1 tablet (75 mg total) by mouth daily. Qty: 30 tablet, Refills: 1    collagenase (SANTYL) ointment Apply topically daily. Qty: 15 g, Refills: 0    diphenhydrAMINE (BENADRYL) 25 mg capsule Take 25 mg by mouth every 6 (six) hours as needed for itching.    gabapentin (NEURONTIN) 100 MG capsule Take 2 capsules by mouth 2 (two) times daily. Take 2 capsules (200mg ) twice a day    lisinopril (PRINIVIL,ZESTRIL) 2.5 MG tablet Take 1 tablet (2.5 mg total) by mouth daily. Qty: 30 tablet, Refills: 0    loratadine (CLARITIN) 10 MG tablet Take 10 mg by mouth daily.    potassium chloride SA (K-DUR,KLOR-CON) 20 MEQ tablet Take 20 mEq by mouth daily.    Vitamin D, Ergocalciferol, (DRISDOL) 50000 units CAPS capsule Take 1 capsule by mouth once a week.      STOP taking these medications     ampicillin 2 g in sodium chloride 0.9 % 50 mL      insulin aspart protamine- aspart (  NOVOLOG MIX 70/30) (70-30) 100 UNIT/ML injection      insulin aspart protamine- aspart (NOVOLOG MIX 70/30) (70-30) 100 UNIT/ML injection          DISCHARGE INSTRUCTIONS:  Follow-up with primary care physician Dr.  Lajuana Carry in 3-5 days. Follow up in the heel culture Follow-up with the neurology Dr. Hale Bogus in a month Follow-up with vascular surgery in a month Follow-up with podiatry Dr. Ether Griffins in a week Follow-up with pulmonology in 2 weeks   DIET:  Cardiac dietAnd diabetic  DISCHARGE CONDITION:  Fair  ACTIVITY:  Activity as tolerated  OXYGEN:  Home Oxygen: No.   Oxygen Delivery: room air  DISCHARGE LOCATION:   Skilled nursing facility for rehabilitation  If you experience worsening of your admission symptoms, develop shortness of breath, life threatening emergency, suicidal or homicidal thoughts you  must seek medical attention immediately by calling 911 or calling your MD immediately  if symptoms less severe.  You Must read complete instructions/literature along with all the possible adverse reactions/side effects for all the Medicines you take and that have been prescribed to you. Take any new Medicines after you have completely understood and accpet all the possible adverse reactions/side effects.   Please note  You were cared for by a hospitalist during your hospital stay. If you have any questions about your discharge medications or the care you received while you were in the hospital after you are discharged, you can call the unit and asked to speak with the hospitalist on call if the hospitalist that took care of you is not available. Once you are discharged, your primary care physician will handle any further medical issues. Please note that NO REFILLS for any discharge medications will be authorized once you are discharged, as it is imperative that you return to your primary care physician (or establish a relationship with a primary care physician if you do not have one) for your aftercare needs so that they can reassess your need for medications and monitor your lab values.     Today  Chief Complaint  Patient presents with  . Code Sepsis   Pt is doing fine , no over night events. No complaints. PT evaluation Done today recommending snf ROS: CONSTITUTIONAL: Denies fevers, chills. Denies any fatigue, weakness.  EYES: Denies blurry vision, double vision, eye pain. EARS, NOSE, THROAT: Denies tinnitus, ear pain, hearing loss. RESPIRATORY: Denies cough, wheeze, shortness of breath.  CARDIOVASCULAR: Denies chest pain, palpitations, edema.  GASTROINTESTINAL: Denies nausea, vomiting, diarrhea, abdominal pain. Denies bright red blood per rectum. GENITOURINARY: Denies dysuria, hematuria. ENDOCRINE: Denies nocturia or thyroid problems. HEMATOLOGIC AND LYMPHATIC: Denies easy bruising or  bleeding. SKIN: Denies rash or lesion. Left heel ulcer MUSCULOSKELETAL: Denies pain in neck, back, shoulder, knees, hips or arthritic symptoms.  NEUROLOGIC: Denies paralysis, paresthesias.  PSYCHIATRIC: Denies anxiety or depressive symptoms.   VITAL SIGNS:  Blood pressure (!) 147/56, pulse 72, temperature 98.5 F (36.9 C), temperature source Oral, resp. rate 18, height 5\' 2"  (1.575 m), weight 78.1 kg (172 lb 1.6 oz), SpO2 97 %.  I/O:    Intake/Output Summary (Last 24 hours) at 11/23/16 1307 Last data filed at 11/23/16 1224  Gross per 24 hour  Intake              720 ml  Output             3375 ml  Net            -2655 ml    PHYSICAL  EXAMINATION:  GENERAL:  82 y.o.-year-old patient lying in the bed with no acute distress.  EYES: Pupils equal, round, reactive to light and accommodation. No scleral icterus. Extraocular muscles intact.  HEENT: Head atraumatic, normocephalic. Oropharynx and nasopharynx clear.  NECK:  Supple, no jugular venous distention. No thyroid enlargement, no tenderness.  LUNGS: Normal breath sounds bilaterally, no wheezing, rales,rhonchi or crepitation. No use of accessory muscles of respiration.  CARDIOVASCULAR: S1, S2 normal. No murmurs, rubs, or gallops.  ABDOMEN: Soft, non-tender, non-distended. Bowel sounds present. No organomegaly or mass.  EXTREMITIES: Left heel ulcer and clean dressing  No pedal edema, cyanosis, or clubbing.  NEUROLOGIC: Cranial nerves II through XII are intact. Muscle strength 5/5 in all extremities. Sensation intact. Gait not checked.  PSYCHIATRIC: The patient is alert and oriented x 3.  SKIN: No obvious rash, lesion, or ulcer.   DATA REVIEW:   CBC  Recent Labs Lab 11/23/16 1100  WBC 6.4  HGB 8.3*  HCT 25.2*  PLT 326    Chemistries   Recent Labs Lab 11/20/16 2202 11/21/16 0404  11/23/16 0354  NA 140 139  < > 139  K 5.2* 4.9  < > 4.2  CL 105 105  < > 103  CO2 27 27  < > 31  GLUCOSE 257* 266*  < > 122*  BUN 24*  26*  < > 34*  CREATININE 1.06* 1.17*  < > 1.22*  CALCIUM 8.8* 8.2*  < > 8.1*  MG  --  2.2  --   --   AST 25  --   --   --   ALT 24  --   --   --   ALKPHOS 88  --   --   --   BILITOT 0.3  --   --   --   < > = values in this interval not displayed.  Cardiac Enzymes  Recent Labs Lab 11/20/16 2202  TROPONINI <0.03    Microbiology Results  Results for orders placed or performed during the hospital encounter of 11/20/16  Blood Culture (routine x 2)     Status: None (Preliminary result)   Collection Time: 11/20/16 10:03 PM  Result Value Ref Range Status   Specimen Description BLOOD LEFT FOREARM  Final   Special Requests BOTTLES DRAWN AEROBIC AND ANAEROBIC 6CCAERO,3CCANA  Final   Culture NO GROWTH 3 DAYS  Final   Report Status PENDING  Incomplete  Urine culture     Status: None   Collection Time: 11/20/16 10:03 PM  Result Value Ref Range Status   Specimen Description URINE, RANDOM  Final   Special Requests NONE  Final   Culture NO GROWTH [facility]   Final   Report Status 11/22/2016 FINAL  Final  Blood Culture (routine x 2)     Status: None (Preliminary result)   Collection Time: 11/20/16 10:40 PM  Result Value Ref Range Status   Specimen Description BLOOD BLOOD LEFT HAND  Final   Special Requests BOTTLES DRAWN AEROBIC AND ANAEROBIC AER6CC,ANA4CC  Final   Culture NO GROWTH 3 DAYS  Final   Report Status PENDING  Incomplete  MRSA PCR Screening     Status: None   Collection Time: 11/21/16  2:38 AM  Result Value Ref Range Status   MRSA by PCR NEGATIVE NEGATIVE Final    Comment:        The GeneXpert MRSA Assay (FDA approved for NASAL specimens only), is one component of a comprehensive MRSA colonization  surveillance program. It is not intended to diagnose MRSA infection nor to guide or monitor treatment for MRSA infections.   Aerobic Culture (superficial specimen)     Status: None (Preliminary result)   Collection Time: 11/22/16  2:16 PM  Result  Value Ref Range Status   Specimen Description HEEL  Final   Special Requests Normal  Final   Gram Stain   Final    RARE WBC PRESENT, PREDOMINANTLY MONONUCLEAR ABUNDANT GRAM NEGATIVE RODS ABUNDANT GRAM POSITIVE RODS MODERATE GRAM POSITIVE COCCI    Culture   Final    CULTURE REINCUBATED FOR BETTER GROWTH [facility]    Report Status PENDING  Incomplete    RADIOLOGY:  Dg Chest Port 1 View  Result Date: 11/21/2016 CLINICAL DATA:  [age] year old female with pulmonary edema. Subsequent encounter. EXAM: PORTABLE CHEST 1 VIEW COMPARISON:  11/20/2016 chest x-ray. FINDINGS: Asymmetric airspace disease slightly greater on the left minimally changed from prior examination which may represent pulmonary edema with pleural effusions. Cannot exclude basilar infiltrate in the proper clinical setting. No gross pneumothorax. Right PICC line tip mid superior vena cava level. Heart size top-normal. IMPRESSION: Asymmetric airspace disease slightly greater on the left minimally changed from prior examination which may represent pulmonary edema with pleural effusions. Cannot exclude basilar infiltrate in the proper clinical setting. Electronically Signed   By: Lacy DuverneySteven  Olson M.D.   On: 11/21/2016 07:35   Dg Chest Port 1 View  Result Date: 11/20/2016 CLINICAL DATA:  Dyspnea.  Possible sepsis. EXAM: PORTABLE CHEST 1 VIEW COMPARISON:  11/14/2016 FINDINGS: Right upper extremity PICC line remain satisfactorily positioned. Mild vascular and interstitial congestive changes have developed. Central and basilar hazy airspace opacities have developed. Probable small pleural effusions, right greater than left. IMPRESSION: The findings most likely represent mild congestive heart failure. Cannot exclude infectious infiltrates. Electronically Signed   By: Ellery Plunkaniel R Mitchell M.D.   On: 11/20/2016 23:22   Dg Foot Complete Left  Result Date: 11/20/2016 CLINICAL DATA:  Possible sepsis.  Left foot ulcer. EXAM: LEFT  FOOT - COMPLETE 3+ VIEW COMPARISON:  11/09/2016, 11/11/2016 FINDINGS: Negative for acute fracture or dislocation. No radiopaque foreign body. No bony destruction. No soft tissue gas. IMPRESSION: No acute findings. No radiographic evidence of osteomyelitis. No soft tissue gas. Electronically Signed   By: Ellery Plunkaniel R Mitchell M.D.   On: 11/20/2016 23:25    EKG:   Orders placed or performed during the hospital encounter of 11/20/16  . EKG 12-Lead  . EKG 12-Lead      Management plans discussed with the patient, family and they are in agreement.  CODE STATUS:     Code Status Orders        Start     Ordered   11/20/16 2359  Full code  Continuous     11/21/16 0002    Code Status History    Date Active Date Inactive Code Status Order ID Comments User Context   09/15/2016  3:07 AM 09/15/2016 10:43 AM Full Code [id number]  Tonye RoyaltyAlexis Hugelmeyer, DO ED   08/21/2016 11:23 PM 08/25/2016  7:05 PM Full Code [id number]  Tonye RoyaltyAlexis Hugelmeyer, DO Inpatient      TOTAL TIME TAKING CARE OF THIS PATIENT: 45 minutes.   Note: This dictation was prepared with Dragon dictation along with smaller phrase technology. Any transcriptional errors that result from this process are unintentional.   @[facility] @  on 11/23/2016 at 1:07 PM  Between 7am to 6pm - Pager - [phone]  After 6pm go to  [email or web address] - password [email or web address]  [place] [facility]  Office  [phone]  CC: Primary care physician; Barbette Reichmann, MD

## 2016-11-23 NOTE — Discharge Instructions (Signed)
Follow-up with primary care physician Dr.  Lajuana CarryHonde in 3-5 days. Follow up in the heel culture Follow-up with the neurology Dr. Hale BogusPorter in a month Follow-up with vascular surgery in a month Follow-up with podiatry Dr. Ether GriffinsFowler in a week Follow-up with pulmonology in 2 weeks  Outpatient follow-up with CHF and diabetic clinic in a week

## 2016-11-23 NOTE — Progress Notes (Signed)
Pt has not had a bowel movement this admission. MD notified. Awaiting orders.

## 2016-11-24 ENCOUNTER — Other Ambulatory Visit: Payer: Self-pay | Admitting: *Deleted

## 2016-11-24 DIAGNOSIS — I739 Peripheral vascular disease, unspecified: Secondary | ICD-10-CM | POA: Diagnosis not present

## 2016-11-24 DIAGNOSIS — N183 Chronic kidney disease, stage 3 (moderate): Secondary | ICD-10-CM | POA: Diagnosis not present

## 2016-11-24 DIAGNOSIS — J449 Chronic obstructive pulmonary disease, unspecified: Secondary | ICD-10-CM | POA: Diagnosis not present

## 2016-11-24 DIAGNOSIS — E119 Type 2 diabetes mellitus without complications: Secondary | ICD-10-CM | POA: Diagnosis not present

## 2016-11-24 DIAGNOSIS — I5023 Acute on chronic systolic (congestive) heart failure: Secondary | ICD-10-CM | POA: Diagnosis not present

## 2016-11-24 NOTE — Patient Outreach (Signed)
[name] [name]) Care Management  11/24/2016  [name] 07-Apr-1934 [id number]   Met with patient briefly at bedside, patient reports she is not feeling well.  Met with discharge planner at facility, [name], to let him know patient is active with [facility] program services And will be following for discharge needs and for community program services after discharge.   [name]. Laymond Purser, RN, BSN, CCM  Post acute care Equities trader [phone]) Business Cell  [phone]) Toll Free Office

## 2016-11-25 LAB — CULTURE, BLOOD (ROUTINE X 2)
Culture: NO GROWTH
Culture: NO GROWTH

## 2016-11-26 LAB — AEROBIC CULTURE W GRAM STAIN (SUPERFICIAL SPECIMEN)

## 2016-11-26 LAB — AEROBIC CULTURE  (SUPERFICIAL SPECIMEN): SPECIAL REQUESTS: NORMAL

## 2016-11-29 ENCOUNTER — Ambulatory Visit: Payer: Self-pay | Admitting: *Deleted

## 2016-11-30 ENCOUNTER — Encounter: Payer: Self-pay | Admitting: *Deleted

## 2016-11-30 ENCOUNTER — Other Ambulatory Visit: Payer: Self-pay | Admitting: *Deleted

## 2016-11-30 NOTE — Patient Outreach (Signed)
[facility](THN) Care Management  [facility]  11/30/2016  [name] [name] 02-02-1934 [id number]  Subjective:  Patient states that she returned to the hospital due to trouble breathing. She had also had a previous fall. Per patient, she lives alone, however her brother and sisiter in law reside next door.  Patient has a daughter that lives in [place] and is currently recovering from a broken leg and a niece Stephannie Petersabatha who lives close to patient and assist with patient's medications, finances and household needs. [name] transports patient to her medical appointments as well. Pateint reports  concern that she has no feeling in her hands and feet. Patient denies ability to stand on her feet and walk on her own at this time  Patient discussed wanting to eventuallly return back home, however states that her family would like her to remain in facility care for 24 hour supervision.  Per patient , she has spoken with the discharge planner who would like to see more participation win PT and OT before she returns home.  Objective:   Encounter Medications:  Outpatient Encounter Prescriptions as of 11/30/2016  Medication Sig  . acetaminophen (TYLENOL) 325 MG tablet Take 2 tablets (650 mg total) by mouth every 6 (six) hours as needed for mild pain (temp > 101.5).  Marland Kitchen. albuterol (PROVENTIL HFA;VENTOLIN HFA) 108 (90 Base) MCG/ACT inhaler Inhale 2 puffs into the lungs every 6 (six) hours as needed for wheezing or shortness of breath.  Marland Kitchen. amoxicillin-clavulanate (AUGMENTIN) 875-125 MG tablet Take 1 tablet by mouth 2 (two) times daily.  Marland Kitchen. aspirin EC 81 MG tablet Take 1 tablet by mouth 1 day or 1 dose.  Marland Kitchen. atorvastatin (LIPITOR) 10 MG tablet Take 1 tablet by mouth at bedtime.   . carvedilol (COREG) 3.125 MG tablet Take 1 tablet (3.125 mg total) by mouth 2 (two) times daily with a meal.  . clopidogrel (PLAVIX) 75 MG tablet Take 1 tablet (75 mg total) by mouth daily.  . collagenase (SANTYL)  ointment Apply topically daily.  . diphenhydrAMINE (BENADRYL) 25 mg capsule Take 25 mg by mouth every 6 (six) hours as needed for itching.  . fluticasone-salmeterol (ADVAIR HFA) 115-21 MCG/ACT inhaler Inhale 2 puffs into the lungs 2 (two) times daily.  . furosemide (LASIX) 20 MG tablet Take 1 tablet (20 mg total) by mouth 2 (two) times daily.  Marland Kitchen. gabapentin (NEURONTIN) 100 MG capsule Take 2 capsules by mouth 2 (two) times daily. Take 2 capsules (200mg ) twice a day  . insulin aspart (NOVOLOG) 100 UNIT/ML injection Inject 0-15 Units into the skin 3 (three) times daily with meals.  . insulin glargine (LANTUS) 100 UNIT/ML injection Inject 0.2 mLs (20 Units total) into the skin at bedtime.  Marland Kitchen. lisinopril (PRINIVIL,ZESTRIL) 2.5 MG tablet Take 1 tablet (2.5 mg total) by mouth daily.  Marland Kitchen. loratadine (CLARITIN) 10 MG tablet Take 10 mg by mouth daily.  . potassium chloride SA (K-DUR,KLOR-CON) 20 MEQ tablet Take 20 mEq by mouth daily.  . predniSONE (STERAPRED UNI-PAK 21 TAB) 10 MG (21) TBPK tablet Take 1 tablet (10 mg total) by mouth daily. Take 6 tablets by mouth for 1 day followed by  5 tablets by mouth for 1 day followed by  4 tablets by mouth for 1 day followed by  3 tablets by mouth for 1 day followed by  2 tablets by mouth for 1 day followed by  1 tablet by mouth for a day and stop  . Vitamin D, Ergocalciferol, (DRISDOL) 50000 units  CAPS capsule Take 1 capsule by mouth once a week.   No facility-administered encounter medications on file as of 11/30/2016.     Functional Status:  In your present state of health, do you have any difficulty performing the following activities: 11/30/2016 11/22/2016  Hearing? N N  Vision? Y N  Difficulty concentrating or making decisions? Malvin JohnsY Y  Walking or climbing stairs? Y Y  Dressing or bathing? Y Y  Doing errands, shopping? Malvin JohnsY Y  Preparing Food and eating ? Y -  Using the Toilet? Y -  In the past six months, have you accidently leaked urine? Y -  Do you have  problems with loss of bowel control? Y -  Managing your Medications? Y -  Managing your Finances? Y -  Housekeeping or managing your Housekeeping? Y -  Some recent data might be hidden    Fall/Depression Screening:  PHQ 2/9 Scores 11/30/2016  PHQ - 2 Score 0    Assessment:  Patient very engaging friendly, concerned about her ability to bear weight on her legs, Not sure when she will be able to do this but plans to actively  participate with the goal to return home.  [facility] discussed, [facility].   This social worker spoke with the discharge planner who states there is no discharge date set yet.  They continue to work on wound care, strengthening and balance.  Plan: This Child psychotherapistsocial worker will follow up with patient while in the SNF to determine discharge needs.   Adriana ReamsChrystal Land, LCSW [facility] Care Management [phone]

## 2016-12-01 ENCOUNTER — Encounter: Payer: Self-pay | Admitting: *Deleted

## 2016-12-02 NOTE — Telephone Encounter (Signed)
This encounter was created in error - please disregard.

## 2016-12-05 ENCOUNTER — Other Ambulatory Visit: Payer: Self-pay | Admitting: *Deleted

## 2016-12-05 NOTE — Patient Outreach (Signed)
[facility](THN) Care Management  [facility]  12/05/2016  [name] [name] [birth year] [id number]  Subjective:    Objective:   Encounter Medications:  Outpatient Encounter Prescriptions as of 12/05/2016  Medication Sig  . acetaminophen (TYLENOL) 325 MG tablet Take 2 tablets (650 mg total) by mouth every 6 (six) hours as needed for mild pain (temp > 101.5).  Marland Kitchen. albuterol (PROVENTIL HFA;VENTOLIN HFA) 108 (90 Base) MCG/ACT inhaler Inhale 2 puffs into the lungs every 6 (six) hours as needed for wheezing or shortness of breath.  Marland Kitchen. aspirin EC 81 MG tablet Take 1 tablet by mouth 1 day or 1 dose.  Marland Kitchen. atorvastatin (LIPITOR) 10 MG tablet Take 1 tablet by mouth at bedtime.   . carvedilol (COREG) 3.125 MG tablet Take 1 tablet (3.125 mg total) by mouth 2 (two) times daily with a meal.  . clopidogrel (PLAVIX) 75 MG tablet Take 1 tablet (75 mg total) by mouth daily.  . collagenase (SANTYL) ointment Apply topically daily.  . diphenhydrAMINE (BENADRYL) 25 mg capsule Take 25 mg by mouth every 6 (six) hours as needed for itching.  . fluticasone-salmeterol (ADVAIR HFA) 115-21 MCG/ACT inhaler Inhale 2 puffs into the lungs 2 (two) times daily.  . furosemide (LASIX) 20 MG tablet Take 1 tablet (20 mg total) by mouth 2 (two) times daily.  Marland Kitchen. gabapentin (NEURONTIN) 100 MG capsule Take 2 capsules by mouth 2 (two) times daily. Take 2 capsules (200mg ) twice a day  . insulin aspart (NOVOLOG) 100 UNIT/ML injection Inject 0-15 Units into the skin 3 (three) times daily with meals.  . insulin glargine (LANTUS) 100 UNIT/ML injection Inject 0.2 mLs (20 Units total) into the skin at bedtime.  Marland Kitchen. lisinopril (PRINIVIL,ZESTRIL) 2.5 MG tablet Take 1 tablet (2.5 mg total) by mouth daily.  Marland Kitchen. loratadine (CLARITIN) 10 MG tablet Take 10 mg by mouth daily.  . potassium chloride SA (K-DUR,KLOR-CON) 20 MEQ tablet Take 20 mEq by mouth daily.  . predniSONE (STERAPRED UNI-PAK 21 TAB) 10 MG (21) TBPK tablet Take 1 tablet  (10 mg total) by mouth daily. Take 6 tablets by mouth for 1 day followed by  5 tablets by mouth for 1 day followed by  4 tablets by mouth for 1 day followed by  3 tablets by mouth for 1 day followed by  2 tablets by mouth for 1 day followed by  1 tablet by mouth for a day and stop  . Vitamin D, Ergocalciferol, (DRISDOL) 50000 units CAPS capsule Take 1 capsule by mouth once a week.   No facility-administered encounter medications on file as of 12/05/2016.     Functional Status:  In your present state of health, do you have any difficulty performing the following activities: 11/30/2016 11/22/2016  Hearing? N N  Vision? Y N  Difficulty concentrating or making decisions? Malvin JohnsY Y  Walking or climbing stairs? Y Y  Dressing or bathing? Y Y  Doing errands, shopping? Malvin JohnsY Y  Preparing Food and eating ? Y -  Using the Toilet? Y -  In the past six months, have you accidently leaked urine? Y -  Do you have problems with loss of bowel control? Y -  Managing your Medications? Y -  Managing your Finances? Y -  Housekeeping or managing your Housekeeping? Y -  Some recent data might be hidden    Fall/Depression Screening:  PHQ 2/9 Scores 11/30/2016  PHQ - 2 Score 0    Assessment: Patient up in wheelchair when this social worker arrived, Patient's  brother and niece in the room when this social worker arrived.  This social worker was able to obtain phone number for niece [name] [name] [phone] who confirms that she does take care of patient's finances, however is not her POA or HCPOA officially.  This social worker was able to confirm that patient does not have a Advanced Directive or Living Will.  [name] discussed concern that patient will return home to minimal support as her daughter who lives with her is recovering from a broken leg and cannot provide any physical assistance. [name] [name] and patient's brother are not available to provide care 24 hours a day. [name] [name] was able to confirm that  patient does have a wheelchair at home, grab bars in her bathroom and a bedside commode.  Patient does not qualify for medicaid and per [name] [name] had a private pay personal care aid in the past, however they had to discontinue the service.  Per [name] [name], patient returning home is not ideal.  Advanced Directive and Living Will discussed with niece [name] [name] and suggested that patient's wishes be documented in the event that she cannot make decisions for herself.  Patient not very confident that she will ever be able to stand and walk again.  Patient's progress in PT and OT not  at this time. Per discharge planner [name] BeeKevin, he will know more about her progress following bed meeting tomorrow 12/06/16.  Plan: This social worker will follow up with discharge planner on 12/06/16 regarding patient's progress and tentative discharge date.

## 2016-12-07 ENCOUNTER — Other Ambulatory Visit: Payer: Self-pay | Admitting: *Deleted

## 2016-12-07 NOTE — Patient Outreach (Signed)
[facility](THN) Care Management  12/07/2016  [name] [name] February 27, 1934 [id number]   Phone call to Caryn BeeKevin, [facility] on 12/06/16 for an update on patient's progress and discharge needs. Per Caryn BeeKevin, they did not have the meeting today as planned.  The team will plan to meet on 12/07/16. He suggested that I call back on 12/07/16 after 1 pm.   Adriana ReamsChrystal Land, LCSW [facility] Care Management [phone]

## 2016-12-07 NOTE — Patient Outreach (Addendum)
[facility](THN) Care Management  12/07/2016  [name] [name] 1934/08/18 [id number]   Phone call from [facility] at [facility] who stated that there has been no discharge date set for patient.  Per Caryn BeeKevin, her foot is not looking very good and they are working on a Physiological scientistvascular surgeon consult. Per Caryn BeeKevin, discharge plans will not be made until they know more about patient's foot.  Phone call to patient's niece [name] to inform her of update. Voicemail message left for a return call.   Plan:  This Child psychotherapistsocial worker will continue to follow patient's progress and discharge planning while in the SNF.   Adriana ReamsChrystal Land, LCSW [facility] Care Management [phone]

## 2016-12-07 NOTE — Patient Outreach (Addendum)
[facility](THN) Care Management  12/07/2016  Frederich Chickrnell Kressin [birth year] [id number]   Phone call to [facility] pl[facility] at [facility] to discuss patient's tentative discharge plan.  Voicemail message left for a return call.    Adriana ReamsChrystal Caylen Kuwahara, LCSW [facility] Care Management [phone]

## 2016-12-08 ENCOUNTER — Ambulatory Visit: Payer: Self-pay | Admitting: *Deleted

## 2016-12-08 ENCOUNTER — Other Ambulatory Visit: Payer: Self-pay | Admitting: *Deleted

## 2016-12-08 NOTE — Patient Outreach (Signed)
[facility]) Care Management  12/08/2016  [name] [name] 1934/03/15 [id number]   Met with [name], SW at facility. [name] making some small gains with therapy. She is also getting wound care. She is to see vascular surgeon in January, may remain at facility until appointment.   Plan to follow for any [facility] care management discharge planning needs. Will collaborate with Levan and LCSW as indicated.  [name]. Laymond Purser, RN, BSN, [place] Cordinator [phone]

## 2016-12-08 NOTE — Patient Outreach (Signed)
[facility](THN) Care Management  12/08/2016  Frederich Chickrnell Brunelle [birth year] [id number]   Voicemail message received from patient's niece [name].  This Child psychotherapistsocial worker returned the call, however received voicemail. HIPPA compliant voicemail left requesting a return care.    Adriana ReamsChrystal Land, LCSW [facility] Care Management [phone]

## 2016-12-09 ENCOUNTER — Other Ambulatory Visit: Payer: Self-pay | Admitting: *Deleted

## 2016-12-09 NOTE — Patient Outreach (Signed)
[facility]) Care Management  12/09/2016  [name] [name] Apr 26, 1934 [id number]   Phone call to  discharge planner [name] to confirm that a home safety evaluation could be completed before patient discharges.  [name] discussed request with therapy manager who stated that as patient get's closer to discharge, they will be able to complete a home safety evaluation.    [name], LCSW [facility] Care Management [phone]

## 2016-12-20 ENCOUNTER — Other Ambulatory Visit: Payer: Self-pay | Admitting: *Deleted

## 2016-12-20 NOTE — Patient Outreach (Signed)
[facility]N) Care Management  12/20/2016  Frederich Chickrnell Congleton [birth year] [id number]   Care coordination phone call with post acute coordinator Clyde CanterburyMary Niemzura, RN  regarding patient's status in skilled care.  Per Corrie DandyMary, she has [facility] and was able to confirm that a medicaid application is pending for LTC placement.   Plan:  This Child psychotherapistsocial worker will follow up with patient's niece [name] next week regarding status of Medicaid application and to assess  for any assistance needed with medicaid process.   Adriana ReamsChrystal Makayla Lanter, LCSW [facility] Care Management [phone]

## 2016-12-20 NOTE — Patient Outreach (Signed)
[facility]) Care Management  12/20/2016  [name] [name] 07/01/1934 [id number]   Late entry for 12/15/16 Met with [name] [name], SW for facility. She states patient has Medicaid pending and she thinks there 40 Medicare days left.  She plans to have another meeting with family to get update.  Plan to update [facility] LCSW  Will follow-up at facility at next facility visit. [name] [name]. [name] Purser, RN, BSN, CCM  [facility] coordinator [phone]

## 2016-12-23 ENCOUNTER — Other Ambulatory Visit: Payer: Self-pay | Admitting: *Deleted

## 2016-12-23 NOTE — Patient Outreach (Signed)
[facility](THN) Care Management  12/23/2016  [name] [name] 11-09-1934 [id number]   Spoke with [name], [facility] for [facility], she states that she has been reaching out to [name], patient niece, regarding Medicaid application and long term plan. She states she knows Medicaid has not been approved as of yet and she is trying to get niece to confirm if application has been completed and if it is pending. She states patient was at another facility and used some days, she only has until 12/29/16 when her Medicare days are up. She states tentative plan was for patient to continue to stay at facility under Medicaid, but she has been unable to confirm with patient contact. Patient would eventually like to return home.  She plans to follow up with [name] and therapy. She states that the vascular appointment is not until 1/18 and not 1/5 as previously stated.   Plan: RNCM requested that SW follow up with patient and family and let RNCM know of patient plans so [facility] care management can be prepared to assist with any [facility] care management needs if she discharges home.   Alben SpittleMary E. Niemczura, RN, BSN, CCM  [facility] Coordination [phone]

## 2016-12-24 ENCOUNTER — Emergency Department: Payer: Medicare HMO

## 2016-12-24 ENCOUNTER — Encounter: Payer: Self-pay | Admitting: Emergency Medicine

## 2016-12-24 ENCOUNTER — Inpatient Hospital Stay
Admission: EM | Admit: 2016-12-24 | Discharge: 2016-12-29 | DRG: 291 | Disposition: A | Discharge status: Skilled Nursing Facility | Payer: Medicare HMO | Attending: Internal Medicine | Admitting: Internal Medicine

## 2016-12-24 DIAGNOSIS — I509 Heart failure, unspecified: Secondary | ICD-10-CM | POA: Diagnosis not present

## 2016-12-24 DIAGNOSIS — E1122 Type 2 diabetes mellitus with diabetic chronic kidney disease: Secondary | ICD-10-CM | POA: Diagnosis present

## 2016-12-24 DIAGNOSIS — Z87891 Personal history of nicotine dependence: Secondary | ICD-10-CM | POA: Diagnosis not present

## 2016-12-24 DIAGNOSIS — Z7982 Long term (current) use of aspirin: Secondary | ICD-10-CM | POA: Diagnosis not present

## 2016-12-24 DIAGNOSIS — E785 Hyperlipidemia, unspecified: Secondary | ICD-10-CM | POA: Diagnosis present

## 2016-12-24 DIAGNOSIS — E1142 Type 2 diabetes mellitus with diabetic polyneuropathy: Secondary | ICD-10-CM | POA: Diagnosis present

## 2016-12-24 DIAGNOSIS — Z79899 Other long term (current) drug therapy: Secondary | ICD-10-CM

## 2016-12-24 DIAGNOSIS — Z794 Long term (current) use of insulin: Secondary | ICD-10-CM

## 2016-12-24 DIAGNOSIS — J449 Chronic obstructive pulmonary disease, unspecified: Secondary | ICD-10-CM | POA: Diagnosis not present

## 2016-12-24 DIAGNOSIS — I10 Essential (primary) hypertension: Secondary | ICD-10-CM | POA: Diagnosis not present

## 2016-12-24 DIAGNOSIS — G9009 Other idiopathic peripheral autonomic neuropathy: Secondary | ICD-10-CM | POA: Diagnosis not present

## 2016-12-24 DIAGNOSIS — Z8249 Family history of ischemic heart disease and other diseases of the circulatory system: Secondary | ICD-10-CM | POA: Diagnosis not present

## 2016-12-24 DIAGNOSIS — I130 Hypertensive heart and chronic kidney disease with heart failure and stage 1 through stage 4 chronic kidney disease, or unspecified chronic kidney disease: Secondary | ICD-10-CM | POA: Diagnosis present

## 2016-12-24 DIAGNOSIS — N183 Chronic kidney disease, stage 3 (moderate): Secondary | ICD-10-CM | POA: Diagnosis present

## 2016-12-24 DIAGNOSIS — J441 Chronic obstructive pulmonary disease with (acute) exacerbation: Secondary | ICD-10-CM | POA: Diagnosis present

## 2016-12-24 DIAGNOSIS — Z7401 Bed confinement status: Secondary | ICD-10-CM | POA: Diagnosis not present

## 2016-12-24 DIAGNOSIS — J9601 Acute respiratory failure with hypoxia: Secondary | ICD-10-CM | POA: Diagnosis not present

## 2016-12-24 DIAGNOSIS — L89510 Pressure ulcer of right ankle, unstageable: Secondary | ICD-10-CM | POA: Diagnosis present

## 2016-12-24 DIAGNOSIS — R0602 Shortness of breath: Secondary | ICD-10-CM | POA: Diagnosis present

## 2016-12-24 DIAGNOSIS — Z741 Need for assistance with personal care: Secondary | ICD-10-CM | POA: Diagnosis not present

## 2016-12-24 DIAGNOSIS — L8990 Pressure ulcer of unspecified site, unspecified stage: Secondary | ICD-10-CM | POA: Insufficient documentation

## 2016-12-24 DIAGNOSIS — Z7902 Long term (current) use of antithrombotics/antiplatelets: Secondary | ICD-10-CM | POA: Diagnosis not present

## 2016-12-24 DIAGNOSIS — J45901 Unspecified asthma with (acute) exacerbation: Secondary | ICD-10-CM | POA: Diagnosis present

## 2016-12-24 DIAGNOSIS — J9621 Acute and chronic respiratory failure with hypoxia: Secondary | ICD-10-CM | POA: Diagnosis present

## 2016-12-24 DIAGNOSIS — I248 Other forms of acute ischemic heart disease: Secondary | ICD-10-CM | POA: Diagnosis present

## 2016-12-24 DIAGNOSIS — Z8673 Personal history of transient ischemic attack (TIA), and cerebral infarction without residual deficits: Secondary | ICD-10-CM | POA: Diagnosis not present

## 2016-12-24 DIAGNOSIS — R0600 Dyspnea, unspecified: Secondary | ICD-10-CM | POA: Diagnosis not present

## 2016-12-24 DIAGNOSIS — R262 Difficulty in walking, not elsewhere classified: Secondary | ICD-10-CM | POA: Diagnosis not present

## 2016-12-24 DIAGNOSIS — E119 Type 2 diabetes mellitus without complications: Secondary | ICD-10-CM | POA: Diagnosis not present

## 2016-12-24 DIAGNOSIS — I5033 Acute on chronic diastolic (congestive) heart failure: Secondary | ICD-10-CM | POA: Diagnosis present

## 2016-12-24 DIAGNOSIS — I739 Peripheral vascular disease, unspecified: Secondary | ICD-10-CM | POA: Diagnosis not present

## 2016-12-24 DIAGNOSIS — Z823 Family history of stroke: Secondary | ICD-10-CM | POA: Diagnosis not present

## 2016-12-24 DIAGNOSIS — D649 Anemia, unspecified: Secondary | ICD-10-CM | POA: Diagnosis not present

## 2016-12-24 DIAGNOSIS — M6281 Muscle weakness (generalized): Secondary | ICD-10-CM

## 2016-12-24 DIAGNOSIS — R41841 Cognitive communication deficit: Secondary | ICD-10-CM | POA: Diagnosis not present

## 2016-12-24 LAB — BASIC METABOLIC PANEL
ANION GAP: 6 (ref 5–15)
BUN: 25 mg/dL — ABNORMAL HIGH (ref 6–20)
CO2: 29 mmol/L (ref 22–32)
Calcium: 8.6 mg/dL — ABNORMAL LOW (ref 8.9–10.3)
Chloride: 100 mmol/L — ABNORMAL LOW (ref 101–111)
Creatinine, Ser: 1.08 mg/dL — ABNORMAL HIGH (ref 0.44–1.00)
GFR calc Af Amer: 55 mL/min — ABNORMAL LOW (ref >60)
GFR, EST NON AFRICAN AMERICAN: 47 mL/min — AB (ref >60)
Glucose, Bld: 419 mg/dL — ABNORMAL HIGH (ref 65–99)
POTASSIUM: 4.6 mmol/L (ref 3.5–5.1)
SODIUM: 135 mmol/L (ref 135–145)

## 2016-12-24 LAB — CBC
HCT: 31.5 % — ABNORMAL LOW (ref 35.0–47.0)
Hemoglobin: 10.2 g/dL — ABNORMAL LOW (ref 12.0–16.0)
MCH: 27.8 pg (ref 26.0–34.0)
MCHC: 32.5 g/dL (ref 32.0–36.0)
MCV: 85.4 fL (ref 80.0–100.0)
PLATELETS: 391 10*3/uL (ref 150–440)
RBC: 3.69 MIL/uL — AB (ref 3.80–5.20)
RDW: 18 % — ABNORMAL HIGH (ref 11.5–14.5)
WBC: 10.9 10*3/uL (ref 3.6–11.0)

## 2016-12-24 LAB — TROPONIN I: TROPONIN I: 0.04 ng/mL — AB (ref <0.03)

## 2016-12-24 LAB — LACTIC ACID, PLASMA: Lactic Acid, Venous: 1.2 mmol/L (ref 0.5–1.9)

## 2016-12-24 LAB — BRAIN NATRIURETIC PEPTIDE: B Natriuretic Peptide: 312 pg/mL — ABNORMAL HIGH (ref 0.0–100.0)

## 2016-12-24 MED ORDER — IPRATROPIUM-ALBUTEROL 0.5-2.5 (3) MG/3ML IN SOLN
3.0000 mL | Freq: Once | RESPIRATORY_TRACT | Status: AC
  Administered 2016-12-24: 3 mL via RESPIRATORY_TRACT
  Filled 2016-12-24: qty 3

## 2016-12-24 MED ORDER — IPRATROPIUM-ALBUTEROL 0.5-2.5 (3) MG/3ML IN SOLN
3.0000 mL | Freq: Once | RESPIRATORY_TRACT | Status: AC
  Administered 2016-12-24: 3 mL via RESPIRATORY_TRACT

## 2016-12-24 MED ORDER — METHYLPREDNISOLONE SODIUM SUCC 125 MG IJ SOLR
125.0000 mg | INTRAMUSCULAR | Status: AC
  Administered 2016-12-24: 125 mg via INTRAVENOUS

## 2016-12-24 NOTE — ED Triage Notes (Signed)
[facility] - brought back for sob, wheezing,

## 2016-12-24 NOTE — ED Notes (Signed)
Pt cleansed of incontinent urine. Pt states she feels like she is breathing "alot better". Pt does not appear to be in respiratory distress anymore. Pt continues to have some rhonchi in rll, but has improved breath sounds in other lobes.

## 2016-12-24 NOTE — ED Notes (Signed)
Report from megan, rn.  

## 2016-12-24 NOTE — ED Notes (Signed)
Report given to April, RN

## 2016-12-24 NOTE — ED Provider Notes (Signed)
[facility] Emergency Department Provider Note   ____________________________________________   None    (approximate)  I have reviewed the triage vital signs and the nursing notes.   HISTORY  Chief Complaint Wheezing  EM caveat: The patient very poor historian, questionably slight confusion and I will provide a reliable history  HPI [name] [name] is a 83 y.o. female reports she is short of breath. The patient is not quite certain, reports she's been feeling short of breath for at least a day. She is unaware of any treatments that she may have utilized but does have a history of COPD    Past Medical History:  Diagnosis Date  . Diabetes mellitus without complication (HCC)   . Hypertension   . Stroke [facility])     Patient Active Problem List   Diagnosis Date Noted  . Acute respiratory distress 11/21/2016  . 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 Acute respiratory failure with hypoxia (HCC) 09/15/2016  . Carpal tunnel syndrome 09/01/2016  . Hypertension 09/01/2016  . PVD (peripheral vascular disease) (HCC) 09/01/2016  . Open abdominal wall wound 09/01/2016  . Abscess 08/21/2016  . Type 2 diabetes mellitus with complication (HCC) 05/20/2015  . Visual disturbance 05/20/2015  . H/O: CVA (cerebrovascular accident) 01/21/2015  . Difficulty in walking 08/27/2014  . Diabetes (HCC) 08/20/2014  . Diabetic neuropathy (HCC) 08/20/2014  . Dizzy spells 08/20/2014  . Hand numbness 08/20/2014  . Obesity 08/20/2014    Past Surgical History:  Procedure Laterality Date  . PERIPHERAL VASCULAR CATHETERIZATION N/A 11/14/2016   Procedure: Lower Extremity Angiography;  Surgeon: Annice Needy, MD;  Location: [facility] INVASIVE CV LAB;  Service: Cardiovascular;  Laterality: N/A;  . PERIPHERAL VASCULAR CATHETERIZATION Right 11/21/2016   Procedure: Lower Extremity Angiography;  Surgeon: Annice Needy, MD;  Location: [facility] INVASIVE CV LAB;   Service: Cardiovascular;  Laterality: Right;  . PERIPHERAL VASCULAR CATHETERIZATION N/A 11/21/2016   Procedure: Abdominal Aortogram w/Lower Extremity;  Surgeon: Annice Needy, MD;  Location: [facility] INVASIVE CV LAB;  Service: Cardiovascular;  Laterality: N/A;  . PERIPHERAL VASCULAR CATHETERIZATION  11/21/2016   Procedure: Lower Extremity Intervention;  Surgeon: Annice Needy, MD;  Location: [facility] INVASIVE CV LAB;  Service: Cardiovascular;;    Prior to Admission medications   Medication Sig Start Date End Date Taking? Authorizing Provider  acetaminophen (TYLENOL) 325 MG tablet Take 2 tablets (650 mg total) by mouth every 6 (six) hours as needed for mild pain (temp > 101.5). 11/23/16   Ramonita Lab, MD  albuterol (PROVENTIL HFA;VENTOLIN HFA) 108 (90 Base) MCG/ACT inhaler Inhale 2 puffs into the lungs every 6 (six) hours as needed for wheezing or shortness of breath. 11/23/16   Ramonita Lab, MD  aspirin EC 81 MG tablet Take 1 tablet by mouth 1 day or 1 dose.    Historical Provider, MD  atorvastatin (LIPITOR) 10 MG tablet Take 1 tablet by mouth at bedtime.  03/26/16   Historical Provider, MD  carvedilol (COREG) 3.125 MG tablet Take 1 tablet (3.125 mg total) by mouth 2 (two) times daily with a meal. 09/17/16   Katha Hamming, MD  clopidogrel (PLAVIX) 75 MG tablet Take 1 tablet (75 mg total) by mouth daily. 11/16/16   Enedina Finner, MD  collagenase (SANTYL) ointment Apply topically daily. 11/15/16   Enedina Finner, MD  diphenhydrAMINE (BENADRYL) 25 mg capsule Take 25 mg by mouth every 6 (six) hours as needed for itching.    Historical Provider, MD  fluticasone-salmeterol (ADVAIR HFA) 115-21 MCG/ACT inhaler Inhale 2 puffs into the lungs 2 (two) times daily. 11/23/16   Ramonita LabAruna Gouru, MD  furosemide (LASIX) 20 MG tablet Take 1 tablet (20 mg total) by mouth 2 (two) times daily. 11/23/16   Ramonita LabAruna Gouru, MD  gabapentin (NEURONTIN) 100 MG capsule Take 2 capsules by mouth 2 (two) times daily. Take 2 capsules (200mg ) twice a day 02/23/16    Historical Provider, MD  insulin aspart (NOVOLOG) 100 UNIT/ML injection Inject 0-15 Units into the skin 3 (three) times daily with meals. 11/23/16   Ramonita LabAruna Gouru, MD  insulin glargine (LANTUS) 100 UNIT/ML injection Inject 0.2 mLs (20 Units total) into the skin at bedtime. 11/23/16   Ramonita LabAruna Gouru, MD  lisinopril (PRINIVIL,ZESTRIL) 2.5 MG tablet Take 1 tablet (2.5 mg total) by mouth daily. 09/18/16   Katha HammingSnehalatha Konidena, MD  loratadine (CLARITIN) 10 MG tablet Take 10 mg by mouth daily.    Historical Provider, MD  potassium chloride SA (K-DUR,KLOR-CON) 20 MEQ tablet Take 20 mEq by mouth daily.    Historical Provider, MD  predniSONE (STERAPRED UNI-PAK 21 TAB) 10 MG (21) TBPK tablet Take 1 tablet (10 mg total) by mouth daily. Take 6 tablets by mouth for 1 day followed by  5 tablets by mouth for 1 day followed by  4 tablets by mouth for 1 day followed by  3 tablets by mouth for 1 day followed by  2 tablets by mouth for 1 day followed by  1 tablet by mouth for a day and stop 11/23/16   Ramonita LabAruna Gouru, MD  Vitamin D, Ergocalciferol, (DRISDOL) 50000 units CAPS capsule Take 1 capsule by mouth once a week. 03/26/16   Historical Provider, MD    Allergies Patient has no known allergies.  Family History  Problem Relation Age of Onset  . Stroke Mother   . Stroke Father   . Heart disease Paternal Grandfather     Social History Social History  Substance Use Topics  . Smoking status: Former Smoker    Types: Cigarettes  . Smokeless tobacco: Never Used  . Alcohol use No    Review of Systems Patient reports feeling short of breath, wheezing. She is feeling somewhat better after provided treatment by the paramedics she reports. She denies being in any pain or discomfort.  ____________________________________________   PHYSICAL EXAM:  VITAL SIGNS: ED Triage Vitals  Enc Vitals Group     BP 12/24/16 2232 (!) 187/78     Pulse Rate 12/24/16 2232 100     Resp 12/24/16 2232 (!) 39     Temp --      Temp  src --      SpO2 12/24/16 2232 97 %     Weight 12/24/16 2232 180 lb (81.6 kg)     Height 12/24/16 2232 5\' 2"  (1.575 m)     Head Circumference --      Peak Flow --      Pain Score 12/24/16 2233 0     Pain Loc --      Pain Edu? --      Excl. in GC? --     Constitutional: Alert and oriented To self but not date and place. Eyes: Conjunctivae are normal. PERRL. EOMI. Head: Atraumatic. Nose: No congestion/rhinnorhea. Mouth/Throat: Mucous membranes are moist.  Oropharynx non-erythematous. Neck: No stridor.   Cardiovascular: Normal rate, regular rhythm. Grossly normal heart sounds.  Good peripheral circulation. Respiratory: Moderate increased work of breathing, moderate use of accessory muscles. She has bilateral end  expiratory wheezing throughout, appears to at least have moderate work of breathing. No evidence of severe dyspnea or tiring. Gastrointestinal: Soft and nontender. No distention.  Musculoskeletal: No lower extremity tenderness nor edema.   Alert, slightly disoriented. Follows commands well. Patient very poor recall Skin:  Skin is warm, dry and intact. No rash noted. Psychiatric: Mood and affect are slightly anxious.  EMS reported the patient's initial end-tidal CO2 setting shockwave appearance, elevated in the 50s. Improving after treatments. ____________________________________________   LABS (all labs ordered are listed, but only abnormal results are displayed)  Labs Reviewed  BRAIN NATRIURETIC PEPTIDE - Abnormal; Notable for the following:       Result Value   B Natriuretic Peptide 312.0 (*)    All other components within normal limits  CBC - Abnormal; Notable for the following:    RBC 3.69 (*)    Hemoglobin 10.2 (*)    HCT 31.5 (*)    RDW 18.0 (*)    All other components within normal limits  BASIC METABOLIC PANEL - Abnormal; Notable for the following:    Chloride 100 (*)    Glucose, Bld 419 (*)    BUN 25 (*)    Creatinine, Ser 1.08 (*)    Calcium 8.6 (*)     GFR calc non Af Amer 47 (*)    GFR calc Af Amer 55 (*)    All other components within normal limits  TROPONIN I - Abnormal; Notable for the following:    Troponin I 0.04 (*)    All other components within normal limits  BLOOD GAS, VENOUS - Abnormal; Notable for the following:    pCO2, Ven 68 (*)    Bicarbonate 32.0 (*)    Acid-Base Excess 3.9 (*)    All other components within normal limits  LACTIC ACID, PLASMA  LACTIC ACID, PLASMA   ____________________________________________  EKG  Reviewed and interpreted by me at 2235 Heart rate 100 QRS 110 QTc 48 Normal sinus rhythm, borderline left bundle branch block morphology, and probable criteria for LVH compared with previous EKG from 11/20/2016 no significant changes noted  Repeat EKG reviewed and performed by me at 0030 Heart rate 85 Otherwise unchanged from previous ____________________________________________  RADIOLOGY  Dg Chest Port 1 View  Result Date: 12/24/2016 CLINICAL DATA:  83 y/o  F; shortness of breath and wheezing. EXAM: PORTABLE CHEST 1 VIEW COMPARISON:  11/21/2016 chest radiograph FINDINGS: Stable cardiac silhouette within normal limits. Increased reticular markings, diffuse hazy opacities, and obscure a shin of bilateral hemidiaphragms. No pneumothorax. No acute osseous abnormality is evident. IMPRESSION: Diffuse haziness and reticular opacities probably represents mild pulmonary edema. Probable small bilateral effusions with associated basilar atelectasis. Underlying pneumonia is not excluded. Electronically Signed   By: Mitzi Hansen M.D.   On: 12/24/2016 23:18    ____________________________________________   PROCEDURES  Procedure(s) performed: None  Procedures  Critical Care performed: No  CRITICAL CARE Performed by: Sharyn Creamer   Total critical care time: 40 minutes  Critical care time was exclusive of separately billable procedures and treating other patients.  Critical care was  necessary to treat or prevent imminent or life-threatening deterioration.  Critical care was time spent personally by me on the following activities: development of treatment plan with patient and/or surrogate as well as nursing, discussions with consultants, evaluation of patient's response to treatment, examination of patient, obtaining history from patient or surrogate, ordering and performing treatments and interventions, ordering and review of laboratory studies, ordering and review of radiographic  studies, pulse oximetry and re-evaluation of patient's condition.  ____________________________________________   INITIAL IMPRESSION / ASSESSMENT AND PLAN / ED COURSE  Pertinent labs & imaging results that were available during my care of the patient were reviewed by me and considered in my medical decision making (see chart for details).  [name] for shortness of breath. History is somewhat unclear, patient is a poor historian. Does not have any acute neurologic complaint or deficit. She does appear quite dyspneic, moderate work of breathing, relieved significantly with BiPAP. Her chest x-ray today shows findings most concerning for pulmonary edema, and her last admission indicates she improved with diuresis and but also treated for COPD at that time. Her end-tidal CO2 was elevated, possibly indicative of COPD type exacerbation and she did have a waveform consistent with COPD. Given her increased breathing she'll be admitted to the hospital for further workup, given recent hospitalization and COPD exacerbation we'll cover with antibiotic pending further workup.  Patient improving, tolerating BiPAP well with stable hemodynamics.  Clinical Course      ____________________________________________   FINAL CLINICAL IMPRESSION(S) / ED DIAGNOSES  Final diagnoses:  COPD exacerbation (HCC)  Acute on chronic congestive heart failure, unspecified congestive heart failure type (HCC)      NEW  MEDICATIONS STARTED DURING THIS VISIT:  New Prescriptions   No medications on file     Note:  This document was prepared using Dragon voice recognition software and may include unintentional dictation errors.     Sharyn Creamer, MD 12/25/16 [phone]

## 2016-12-24 NOTE — ED Notes (Signed)
Critical troponin of 0.04 called from lab. Dr. Fanny BienQuale notified.

## 2016-12-25 DIAGNOSIS — L89510 Pressure ulcer of right ankle, unstageable: Secondary | ICD-10-CM | POA: Diagnosis present

## 2016-12-25 DIAGNOSIS — Z794 Long term (current) use of insulin: Secondary | ICD-10-CM | POA: Diagnosis not present

## 2016-12-25 DIAGNOSIS — I248 Other forms of acute ischemic heart disease: Secondary | ICD-10-CM | POA: Diagnosis present

## 2016-12-25 DIAGNOSIS — Z823 Family history of stroke: Secondary | ICD-10-CM | POA: Diagnosis not present

## 2016-12-25 DIAGNOSIS — J9621 Acute and chronic respiratory failure with hypoxia: Secondary | ICD-10-CM | POA: Diagnosis present

## 2016-12-25 DIAGNOSIS — I130 Hypertensive heart and chronic kidney disease with heart failure and stage 1 through stage 4 chronic kidney disease, or unspecified chronic kidney disease: Secondary | ICD-10-CM | POA: Diagnosis present

## 2016-12-25 DIAGNOSIS — Z8249 Family history of ischemic heart disease and other diseases of the circulatory system: Secondary | ICD-10-CM | POA: Diagnosis not present

## 2016-12-25 DIAGNOSIS — E785 Hyperlipidemia, unspecified: Secondary | ICD-10-CM | POA: Diagnosis present

## 2016-12-25 DIAGNOSIS — Z8673 Personal history of transient ischemic attack (TIA), and cerebral infarction without residual deficits: Secondary | ICD-10-CM | POA: Diagnosis not present

## 2016-12-25 DIAGNOSIS — J441 Chronic obstructive pulmonary disease with (acute) exacerbation: Secondary | ICD-10-CM | POA: Diagnosis present

## 2016-12-25 DIAGNOSIS — J45901 Unspecified asthma with (acute) exacerbation: Secondary | ICD-10-CM | POA: Diagnosis present

## 2016-12-25 DIAGNOSIS — R0602 Shortness of breath: Secondary | ICD-10-CM | POA: Diagnosis present

## 2016-12-25 DIAGNOSIS — Z7982 Long term (current) use of aspirin: Secondary | ICD-10-CM | POA: Diagnosis not present

## 2016-12-25 DIAGNOSIS — E1142 Type 2 diabetes mellitus with diabetic polyneuropathy: Secondary | ICD-10-CM | POA: Diagnosis present

## 2016-12-25 DIAGNOSIS — I5033 Acute on chronic diastolic (congestive) heart failure: Secondary | ICD-10-CM | POA: Diagnosis present

## 2016-12-25 DIAGNOSIS — Z87891 Personal history of nicotine dependence: Secondary | ICD-10-CM | POA: Diagnosis not present

## 2016-12-25 DIAGNOSIS — N183 Chronic kidney disease, stage 3 (moderate): Secondary | ICD-10-CM | POA: Diagnosis present

## 2016-12-25 DIAGNOSIS — Z7902 Long term (current) use of antithrombotics/antiplatelets: Secondary | ICD-10-CM | POA: Diagnosis not present

## 2016-12-25 DIAGNOSIS — Z79899 Other long term (current) drug therapy: Secondary | ICD-10-CM | POA: Diagnosis not present

## 2016-12-25 DIAGNOSIS — E1122 Type 2 diabetes mellitus with diabetic chronic kidney disease: Secondary | ICD-10-CM | POA: Diagnosis present

## 2016-12-25 LAB — CBC
HCT: 29.5 % — ABNORMAL LOW (ref 35.0–47.0)
HEMOGLOBIN: 9.6 g/dL — AB (ref 12.0–16.0)
MCH: 28 pg (ref 26.0–34.0)
MCHC: 32.5 g/dL (ref 32.0–36.0)
MCV: 86 fL (ref 80.0–100.0)
PLATELETS: 338 10*3/uL (ref 150–440)
RBC: 3.43 MIL/uL — AB (ref 3.80–5.20)
RDW: 17.8 % — ABNORMAL HIGH (ref 11.5–14.5)
WBC: 8.9 10*3/uL (ref 3.6–11.0)

## 2016-12-25 LAB — COMPREHENSIVE METABOLIC PANEL
ALBUMIN: 2.3 g/dL — AB (ref 3.5–5.0)
ALT: 12 U/L — ABNORMAL LOW (ref 14–54)
ANION GAP: 8 (ref 5–15)
AST: 15 U/L (ref 15–41)
Alkaline Phosphatase: 89 U/L (ref 38–126)
BUN: 24 mg/dL — ABNORMAL HIGH (ref 6–20)
CHLORIDE: 100 mmol/L — AB (ref 101–111)
CO2: 28 mmol/L (ref 22–32)
Calcium: 8.4 mg/dL — ABNORMAL LOW (ref 8.9–10.3)
Creatinine, Ser: 1.02 mg/dL — ABNORMAL HIGH (ref 0.44–1.00)
GFR calc Af Amer: 59 mL/min — ABNORMAL LOW (ref >60)
GFR, EST NON AFRICAN AMERICAN: 51 mL/min — AB (ref >60)
GLUCOSE: 438 mg/dL — AB (ref 65–99)
POTASSIUM: 4.7 mmol/L (ref 3.5–5.1)
Sodium: 136 mmol/L (ref 135–145)
TOTAL PROTEIN: 7.5 g/dL (ref 6.5–8.1)
Total Bilirubin: 0.8 mg/dL (ref 0.3–1.2)

## 2016-12-25 LAB — BLOOD GAS, ARTERIAL
ACID-BASE EXCESS: 5.6 mmol/L — AB (ref 0.0–2.0)
BICARBONATE: 31.1 mmol/L — AB (ref 20.0–28.0)
FIO2: 0.32
O2 SAT: 98.9 %
PATIENT TEMPERATURE: 37
PCO2 ART: 49 mmHg — AB (ref 32.0–48.0)
PO2 ART: 125 mmHg — AB (ref 83.0–108.0)
pH, Arterial: 7.41 (ref 7.350–7.450)

## 2016-12-25 LAB — GLUCOSE, CAPILLARY
GLUCOSE-CAPILLARY: 237 mg/dL — AB (ref 65–99)
Glucose-Capillary: 344 mg/dL — ABNORMAL HIGH (ref 65–99)
Glucose-Capillary: 420 mg/dL — ABNORMAL HIGH (ref 65–99)
Glucose-Capillary: 361 mg/dL — ABNORMAL HIGH (ref 65–99)
Glucose-Capillary: 203 mg/dL — ABNORMAL HIGH (ref 65–99)
Glucose-Capillary: 303 mg/dL — ABNORMAL HIGH (ref 65–99)

## 2016-12-25 LAB — LIPID PANEL
Cholesterol: 118 mg/dL (ref 0–200)
HDL: 30 mg/dL — ABNORMAL LOW (ref >40)
LDL CALC: 80 mg/dL (ref 0–99)
TRIGLYCERIDES: 40 mg/dL (ref <150)
Total CHOL/HDL Ratio: 3.9 RATIO
VLDL: 8 mg/dL (ref 0–40)

## 2016-12-25 LAB — MRSA PCR SCREENING: MRSA by PCR: NEGATIVE

## 2016-12-25 LAB — TROPONIN I
TROPONIN I: 1.1 ng/mL — AB (ref <0.03)
Troponin I: 1.08 ng/mL (ref <0.03)
Troponin I: 0.84 ng/mL (ref <0.03)

## 2016-12-25 LAB — PHOSPHORUS: PHOSPHORUS: 3.2 mg/dL (ref 2.5–4.6)

## 2016-12-25 LAB — MAGNESIUM: MAGNESIUM: 2.2 mg/dL (ref 1.7–2.4)

## 2016-12-25 LAB — TSH: TSH: 1.911 u[IU]/mL (ref 0.350–4.500)

## 2016-12-25 MED ORDER — SODIUM CHLORIDE 0.9 % IV SOLN
250.0000 mL | INTRAVENOUS | Status: DC | PRN
Start: 2016-12-25 — End: 2016-12-28

## 2016-12-25 MED ORDER — GUAIFENESIN ER 600 MG PO TB12
600.0000 mg | ORAL_TABLET | Freq: Two times a day (BID) | ORAL | Status: DC
  Administered 2016-12-25 – 2016-12-29 (×9): 600 mg via ORAL
  Filled 2016-12-25 (×9): qty 1

## 2016-12-25 MED ORDER — ENOXAPARIN SODIUM 40 MG/0.4ML ~~LOC~~ SOLN: 40.0000 mg | Freq: Every day | SUBCUTANEOUS | Status: DC

## 2016-12-25 MED ORDER — POTASSIUM CHLORIDE CRYS ER 20 MEQ PO TBCR
20.0000 meq | EXTENDED_RELEASE_TABLET | Freq: Every day | ORAL | Status: DC
  Administered 2016-12-25 – 2016-12-29 (×5): 20 meq via ORAL
  Filled 2016-12-25 (×5): qty 1

## 2016-12-25 MED ORDER — ASPIRIN EC 81 MG PO TBEC
81.0000 mg | DELAYED_RELEASE_TABLET | ORAL | Status: DC
  Administered 2016-12-25 – 2016-12-26 (×2): 81 mg via ORAL
  Filled 2016-12-25 (×3): qty 1

## 2016-12-25 MED ORDER — SODIUM CHLORIDE 0.9% FLUSH
3.0000 mL | INTRAVENOUS | Status: DC | PRN
  Administered 2016-12-27: 3 mL via INTRAVENOUS
  Filled 2016-12-25: qty 3

## 2016-12-25 MED ORDER — ENOXAPARIN SODIUM 80 MG/0.8ML ~~LOC~~ SOLN
1.0000 mg/kg | Freq: Two times a day (BID) | SUBCUTANEOUS | Status: DC
  Administered 2016-12-25 – 2016-12-26 (×3): 75 mg via SUBCUTANEOUS
  Filled 2016-12-25 (×3): qty 0.8

## 2016-12-25 MED ORDER — LORATADINE 10 MG PO TABS
10.0000 mg | ORAL_TABLET | Freq: Every day | ORAL | Status: DC
  Administered 2016-12-25 – 2016-12-29 (×5): 10 mg via ORAL
  Filled 2016-12-25 (×5): qty 1

## 2016-12-25 MED ORDER — LISINOPRIL 5 MG PO TABS
2.5000 mg | ORAL_TABLET | Freq: Every day | ORAL | Status: DC
Start: 2016-12-25 — End: 2016-12-29
  Administered 2016-12-25 – 2016-12-29 (×5): 2.5 mg via ORAL
  Filled 2016-12-25 (×5): qty 1

## 2016-12-25 MED ORDER — LEVOFLOXACIN IN D5W 750 MG/150ML IV SOLN
750.0000 mg | Freq: Once | INTRAVENOUS | Status: AC
  Administered 2016-12-25: 750 mg via INTRAVENOUS

## 2016-12-25 MED ORDER — FUROSEMIDE 10 MG/ML IJ SOLN
40.0000 mg | Freq: Once | INTRAMUSCULAR | Status: AC
  Administered 2016-12-25: 40 mg via INTRAVENOUS
  Filled 2016-12-25: qty 4

## 2016-12-25 MED ORDER — IPRATROPIUM-ALBUTEROL 0.5-2.5 (3) MG/3ML IN SOLN: 3.0000 mL | Freq: Four times a day (QID) | RESPIRATORY_TRACT | Status: DC | PRN

## 2016-12-25 MED ORDER — ACETAMINOPHEN 650 MG RE SUPP: 650.0000 mg | Freq: Four times a day (QID) | RECTAL | Status: DC | PRN

## 2016-12-25 MED ORDER — ONDANSETRON HCL 4 MG PO TABS: 4.0000 mg | ORAL_TABLET | Freq: Four times a day (QID) | ORAL | Status: DC | PRN

## 2016-12-25 MED ORDER — ACETAMINOPHEN 325 MG PO TABS: 650.0000 mg | ORAL_TABLET | Freq: Four times a day (QID) | ORAL | Status: DC | PRN

## 2016-12-25 MED ORDER — SENNOSIDES-DOCUSATE SODIUM 8.6-50 MG PO TABS: 1.0000 | ORAL_TABLET | Freq: Every evening | ORAL | Status: DC | PRN

## 2016-12-25 MED ORDER — CARVEDILOL 3.125 MG PO TABS
3.1250 mg | ORAL_TABLET | Freq: Two times a day (BID) | ORAL | Status: DC
  Administered 2016-12-25 – 2016-12-29 (×10): 3.125 mg via ORAL
  Filled 2016-12-25 (×10): qty 1

## 2016-12-25 MED ORDER — ORAL CARE MOUTH RINSE: 15.0000 mL | Freq: Two times a day (BID) | OROMUCOSAL | Status: DC

## 2016-12-25 MED ORDER — NITROGLYCERIN 2 % TD OINT
0.5000 [in_us] | TOPICAL_OINTMENT | Freq: Four times a day (QID) | TRANSDERMAL | Status: DC
  Administered 2016-12-25 – 2016-12-26 (×5): 0.5 [in_us] via TOPICAL
  Filled 2016-12-25 (×5): qty 1

## 2016-12-25 MED ORDER — SODIUM CHLORIDE 0.9% FLUSH
3.0000 mL | Freq: Two times a day (BID) | INTRAVENOUS | Status: DC
  Administered 2016-12-25 – 2016-12-28 (×7): 3 mL via INTRAVENOUS

## 2016-12-25 MED ORDER — MOMETASONE FURO-FORMOTEROL FUM 200-5 MCG/ACT IN AERO
2.0000 | INHALATION_SPRAY | Freq: Two times a day (BID) | RESPIRATORY_TRACT | Status: DC
  Administered 2016-12-25 – 2016-12-29 (×9): 2 via RESPIRATORY_TRACT
  Filled 2016-12-25: qty 8.8

## 2016-12-25 MED ORDER — OXYCODONE HCL 5 MG PO TABS: 5.0000 mg | ORAL_TABLET | ORAL | Status: DC | PRN

## 2016-12-25 MED ORDER — FUROSEMIDE 10 MG/ML IJ SOLN
20.0000 mg | Freq: Two times a day (BID) | INTRAMUSCULAR | Status: DC
  Administered 2016-12-25 (×2): 20 mg via INTRAVENOUS
  Filled 2016-12-25 (×2): qty 2

## 2016-12-25 MED ORDER — ONDANSETRON HCL 4 MG/2ML IJ SOLN: 4.0000 mg | Freq: Four times a day (QID) | INTRAMUSCULAR | Status: DC | PRN

## 2016-12-25 MED ORDER — MAGNESIUM CITRATE PO SOLN: 1.0000 | Freq: Once | ORAL | Status: DC | PRN

## 2016-12-25 MED ORDER — ATORVASTATIN CALCIUM 10 MG PO TABS
10.0000 mg | ORAL_TABLET | Freq: Every day | ORAL | Status: DC
  Administered 2016-12-25 – 2016-12-28 (×4): 10 mg via ORAL
  Filled 2016-12-25 (×4): qty 1

## 2016-12-25 MED ORDER — BISACODYL 5 MG PO TBEC: 5.0000 mg | DELAYED_RELEASE_TABLET | Freq: Every day | ORAL | Status: DC | PRN

## 2016-12-25 MED ORDER — DEXTROMETHORPHAN POLISTIREX ER 30 MG/5ML PO SUER
30.0000 mg | Freq: Two times a day (BID) | ORAL | Status: DC
  Administered 2016-12-25 – 2016-12-29 (×9): 30 mg via ORAL
  Filled 2016-12-25 (×11): qty 5

## 2016-12-25 MED ORDER — CLOPIDOGREL BISULFATE 75 MG PO TABS
75.0000 mg | ORAL_TABLET | Freq: Every day | ORAL | Status: DC
  Administered 2016-12-25 – 2016-12-29 (×5): 75 mg via ORAL
  Filled 2016-12-25 (×5): qty 1

## 2016-12-25 MED ORDER — INSULIN ASPART 100 UNIT/ML ~~LOC~~ SOLN
16.0000 [IU] | Freq: Once | SUBCUTANEOUS | Status: AC
  Administered 2016-12-25: 16 [IU] via SUBCUTANEOUS
  Filled 2016-12-25: qty 16

## 2016-12-25 MED ORDER — INSULIN ASPART 100 UNIT/ML ~~LOC~~ SOLN
0.0000 [IU] | SUBCUTANEOUS | Status: DC
  Administered 2016-12-25: 7 [IU] via SUBCUTANEOUS
  Administered 2016-12-25: 20 [IU] via SUBCUTANEOUS
  Administered 2016-12-25: 15 [IU] via SUBCUTANEOUS
  Administered 2016-12-25 – 2016-12-26 (×3): 7 [IU] via SUBCUTANEOUS
  Filled 2016-12-25: qty 7
  Filled 2016-12-25: qty 15
  Filled 2016-12-25 (×3): qty 7
  Filled 2016-12-25: qty 20

## 2016-12-25 MED ORDER — DM-GUAIFENESIN ER 30-600 MG PO TB12: 1.0000 | ORAL_TABLET | Freq: Two times a day (BID) | ORAL | Status: DC

## 2016-12-25 MED ORDER — SODIUM CHLORIDE 0.9% FLUSH
3.0000 mL | Freq: Two times a day (BID) | INTRAVENOUS | Status: DC
  Administered 2016-12-25 – 2016-12-26 (×4): 3 mL via INTRAVENOUS

## 2016-12-25 MED ORDER — GABAPENTIN 100 MG PO CAPS
200.0000 mg | ORAL_CAPSULE | Freq: Two times a day (BID) | ORAL | Status: DC
  Administered 2016-12-25 – 2016-12-29 (×9): 200 mg via ORAL
  Filled 2016-12-25 (×9): qty 2

## 2016-12-25 MED ORDER — VITAMIN D (ERGOCALCIFEROL) 1.25 MG (50000 UNIT) PO CAPS
50000.0000 [IU] | ORAL_CAPSULE | ORAL | Status: DC
  Administered 2016-12-26: 50000 [IU] via ORAL
  Filled 2016-12-25: qty 1

## 2016-12-25 NOTE — Progress Notes (Signed)
Dr. Tobi BastosPyreddy, notified of critical troponin.  Acknowleged. Cardiology consult already ordered.

## 2016-12-25 NOTE — ED Notes (Signed)
Pt cleansed of urine and stool incontinence.

## 2016-12-25 NOTE — H&P (Signed)
History and Physical   [facility] [place] [facility] Admission History and Physical [employer] Harly Pipkins, D.O.    Patient Name: [name] [name] Marrazzo MR#: [id number] Date of Birth: March 23, 1934 Date of Admission: 12/24/2016  Referring MD/NP/PA: Dr. Fanny BienQuale Primary Care Physician: Barbette ReichmannHANDE,VISHWANATH, MD  Chief Complaint: SOB  HPI: [name] [name] Rupert is a 83 y.o. female with a known history of DM, HTN, CVA, diastolic CHF, HTN, peripheral neuropathy  presents to the emergency department for evaluation of SOB.  Patient was in a usual state of health until this evening when she developed sudden onset of severe shortness of breath when she went to sleep.  Yesterday was unremarkable.   Recently admitted in 09/14/16 & 11/23/16  for acute on chronic diastolic CHF and COPD exacerbation.  Patient has no formal diagnosis of COPD, takes albuterol HFA/Advair as needed.      Otherwise there has been no change in status. Patient has been taking medication as prescribed and there has been no recent change in medication or diet.  There has been no recent illness, travel or sick contacts.    Patient denies fevers/chills, weakness, dizziness, chest pain, N/V/C/D, abdominal pain, dysuria/frequency, changes in mental status.   ED Course: Duoneb, Levaquin, Solumedrol, Lasix 40 IV.   Review of Systems:  CONSTITUTIONAL: No fever/chills, fatigue, weakness, weight gain/loss, headache. EYES: No blurry or double vision. ENT: No tinnitus, postnasal drip, redness or soreness of the oropharynx. RESPIRATORY: Positive cough, dyspnea, wheeze, Negative hemoptysis.  CARDIOVASCULAR: No chest pain, palpitations, syncope, orthopnea,  GASTROINTESTINAL: No nausea, vomiting, abdominal pain, constipation, diarrhea.  No hematemesis, melena or hematochezia. GENITOURINARY: No dysuria, frequency, hematuria. ENDOCRINE: No polyuria or nocturia. No heat or cold intolerance. HEMATOLOGY: No anemia, bruising, bleeding. INTEGUMENTARY: No rashes,  ulcers, lesions. MUSCULOSKELETAL: No arthritis, gout, dyspnea.  NEUROLOGIC: No numbness, tingling, ataxia, seizure-type activity, weakness. PSYCHIATRIC: No anxiety, depression, insomnia.   Past Medical History:  Diagnosis Date  . Diabetes mellitus without complication (HCC)   . Hypertension   . Stroke [facility](HCC)     Past Surgical History:  Procedure Laterality Date  . PERIPHERAL VASCULAR CATHETERIZATION N/A 11/14/2016   Procedure: Lower Extremity Angiography;  Surgeon: Annice NeedyJason S Dew, MD;  Location: [facility] INVASIVE CV LAB;  Service: Cardiovascular;  Laterality: N/A;  . PERIPHERAL VASCULAR CATHETERIZATION Right 11/21/2016   Procedure: Lower Extremity Angiography;  Surgeon: Annice NeedyJason S Dew, MD;  Location: [facility] INVASIVE CV LAB;  Service: Cardiovascular;  Laterality: Right;  . PERIPHERAL VASCULAR CATHETERIZATION N/A 11/21/2016   Procedure: Abdominal Aortogram w/Lower Extremity;  Surgeon: Annice NeedyJason S Dew, MD;  Location: [facility] INVASIVE CV LAB;  Service: Cardiovascular;  Laterality: N/A;  . PERIPHERAL VASCULAR CATHETERIZATION  11/21/2016   Procedure: Lower Extremity Intervention;  Surgeon: Annice NeedyJason S Dew, MD;  Location: [facility] INVASIVE CV LAB;  Service: Cardiovascular;;     reports that she has quit smoking. Her smoking use included Cigarettes. She has never used smokeless tobacco. She reports that she does not drink alcohol or use drugs.  No Known Allergies  Family History  Problem Relation Age of Onset  . Stroke Mother   . Stroke Father   . Heart disease Paternal Grandfather    Family history has been reviewed and confirmed with patient.   Prior to Admission medications   Medication Sig Start Date End Date Taking? Authorizing Provider  acetaminophen (TYLENOL) 325 MG tablet Take 2 tablets (650 mg total) by mouth every 6 (six) hours as needed for mild pain (temp > 101.5). 11/23/16   Ramonita LabAruna Gouru, MD  albuterol (PROVENTIL HFA;VENTOLIN HFA) 108 (90 Base) MCG/ACT inhaler Inhale 2 puffs into the lungs every 6 (six)  hours as needed for wheezing or shortness of breath. 11/23/16   Ramonita Lab, MD  aspirin EC 81 MG tablet Take 1 tablet by mouth 1 day or 1 dose.    Historical Provider, MD  atorvastatin (LIPITOR) 10 MG tablet Take 1 tablet by mouth at bedtime.  03/26/16   Historical Provider, MD  carvedilol (COREG) 3.125 MG tablet Take 1 tablet (3.125 mg total) by mouth 2 (two) times daily with a meal. 09/17/16   Katha Hamming, MD  clopidogrel (PLAVIX) 75 MG tablet Take 1 tablet (75 mg total) by mouth daily. 11/16/16   Enedina Finner, MD  collagenase (SANTYL) ointment Apply topically daily. 11/15/16   Enedina Finner, MD  diphenhydrAMINE (BENADRYL) 25 mg capsule Take 25 mg by mouth every 6 (six) hours as needed for itching.    Historical Provider, MD  fluticasone-salmeterol (ADVAIR HFA) 115-21 MCG/ACT inhaler Inhale 2 puffs into the lungs 2 (two) times daily. 11/23/16   Ramonita Lab, MD  furosemide (LASIX) 20 MG tablet Take 1 tablet (20 mg total) by mouth 2 (two) times daily. 11/23/16   Ramonita Lab, MD  gabapentin (NEURONTIN) 100 MG capsule Take 2 capsules by mouth 2 (two) times daily. Take 2 capsules (200mg ) twice a day 02/23/16   Historical Provider, MD  insulin aspart (NOVOLOG) 100 UNIT/ML injection Inject 0-15 Units into the skin 3 (three) times daily with meals. 11/23/16   Ramonita Lab, MD  insulin glargine (LANTUS) 100 UNIT/ML injection Inject 0.2 mLs (20 Units total) into the skin at bedtime. 11/23/16   Ramonita Lab, MD  lisinopril (PRINIVIL,ZESTRIL) 2.5 MG tablet Take 1 tablet (2.5 mg total) by mouth daily. 09/18/16   Katha Hamming, MD  loratadine (CLARITIN) 10 MG tablet Take 10 mg by mouth daily.    Historical Provider, MD  potassium chloride SA (K-DUR,KLOR-CON) 20 MEQ tablet Take 20 mEq by mouth daily.    Historical Provider, MD  predniSONE (STERAPRED UNI-PAK 21 TAB) 10 MG (21) TBPK tablet Take 1 tablet (10 mg total) by mouth daily. Take 6 tablets by mouth for 1 day followed by  5 tablets by mouth for 1 day followed  by  4 tablets by mouth for 1 day followed by  3 tablets by mouth for 1 day followed by  2 tablets by mouth for 1 day followed by  1 tablet by mouth for a day and stop 11/23/16   Ramonita Lab, MD  Vitamin D, Ergocalciferol, (DRISDOL) 50000 units CAPS capsule Take 1 capsule by mouth once a week. 03/26/16   Historical Provider, MD    Physical Exam: Vitals:   12/24/16 2300 12/24/16 2330 12/25/16 0000 12/25/16 0030  BP:  (!) 149/64 138/77 (!) 142/62  Pulse: 93 92 85 83  Resp: 19 (!) 31 (!) 23 (!) 22  Temp:      TempSrc:      SpO2: 100% 100% 98% 98%  Weight:      Height:        GENERAL: 83 y.o.-year-old female patient, well-developed, well-nourished lying in the bed in no acute distress.  Pleasant and cooperative.   HEENT: Head atraumatic, normocephalic. Pupils equal, round, reactive to light and accommodation. No scleral icterus. Extraocular muscles intact. Nares are patent. Oropharynx is clear. Mucus membranes moist. NECK: Supple, full range of motion. No JVD, no bruit heard. No thyroid enlargement, no tenderness, no cervical lymphadenopathy. CHEST: . Bibasilar crackles with diffuse  expiratory wheezing.  No use of accessory muscles of respiration.  No reproducible chest wall tenderness.  CARDIOVASCULAR: S1, S2 normal. No murmurs, rubs, or gallops. Cap refill <2 seconds. Pulses intact distally.  ABDOMEN: Soft, nondistended, nontender, . No rebound, guarding, rigidity. Normoactive bowel sounds present in all four quadrants. No organomegaly or mass. EXTREMITIES: No pedal edema, cyanosis, or clubbing. NEUROLOGIC: Cranial nerves II through XII are grossly intact with no focal sensorimotor deficit. Muscle strength 5/5 in all extremities. Sensation intact. Gait not checked. PSYCHIATRIC: The patient is alert and oriented x 3. Normal affect, mood, thought content. SKIN: Warm, dry, and intact without obvious rash, lesion, or ulcer.   Labs on Admission: I have personally reviewed following labs and  imaging studies  CBC:  Recent Labs Lab 12/24/16 2244  WBC 10.9  HGB 10.2*  HCT 31.5*  MCV 85.4  PLT 391   Basic Metabolic Panel:  Recent Labs Lab 12/24/16 2244  NA 135  K 4.6  CL 100*  CO2 29  GLUCOSE 419*  BUN 25*  CREATININE 1.08*  CALCIUM 8.6*   GFR: Estimated Creatinine Clearance: 41.1 mL/min (by C-G formula based on SCr of 1.08 mg/dL (H)). Liver Function Tests: No results for input(s): AST, ALT, ALKPHOS, BILITOT, PROT, ALBUMIN in the last 168 hours. No results for input(s): LIPASE, AMYLASE in the last 168 hours. No results for input(s): AMMONIA in the last 168 hours. Coagulation Profile: No results for input(s): INR, PROTIME in the last 168 hours. Cardiac Enzymes:  Recent Labs Lab 12/24/16 2244  TROPONINI 0.04*   BNP (last 3 results) No results for input(s): PROBNP in the last 8760 hours. HbA1C: No results for input(s): HGBA1C in the last 72 hours. CBG: No results for input(s): GLUCAP in the last 168 hours. Lipid Profile: No results for input(s): CHOL, HDL, LDLCALC, TRIG, CHOLHDL, LDLDIRECT in the last 72 hours. Thyroid Function Tests: No results for input(s): TSH, T4TOTAL, FREET4, T3FREE, THYROIDAB in the last 72 hours. Anemia Panel: No results for input(s): VITAMINB12, FOLATE, FERRITIN, TIBC, IRON, RETICCTPCT in the last 72 hours. Urine analysis:    Component Value Date/Time   COLORURINE YELLOW (A) 11/20/2016 2203   APPEARANCEUR HAZY (A) 11/20/2016 2203   APPEARANCEUR Hazy 02/04/2014 1719   LABSPEC 1.018 11/20/2016 2203   LABSPEC 1.005 02/04/2014 1719   PHURINE 5.0 11/20/2016 2203   GLUCOSEU 50 (A) 11/20/2016 2203   GLUCOSEU >=500 02/04/2014 1719   HGBUR NEGATIVE 11/20/2016 2203   BILIRUBINUR NEGATIVE 11/20/2016 2203   KETONESUR NEGATIVE 11/20/2016 2203   PROTEINUR 100 (A) 11/20/2016 2203   NITRITE NEGATIVE 11/20/2016 2203   LEUKOCYTESUR NEGATIVE 11/20/2016 2203   LEUKOCYTESUR Trace 02/04/2014 1719   Sepsis  Labs: @LABRCNTIP (procalcitonin:4,lacticidven:4) )No results found for this or any previous visit (from the past 240 hour(s)).   Radiological Exams on Admission: Dg Chest Port 1 View  Result Date: 12/24/2016 CLINICAL DATA:  83 y/o  F; shortness of breath and wheezing. EXAM: PORTABLE CHEST 1 VIEW COMPARISON:  11/21/2016 chest radiograph FINDINGS: Stable cardiac silhouette within normal limits. Increased reticular markings, diffuse hazy opacities, and obscure a shin of bilateral hemidiaphragms. No pneumothorax. No acute osseous abnormality is evident. IMPRESSION: Diffuse haziness and reticular opacities probably represents mild pulmonary edema. Probable small bilateral effusions with associated basilar atelectasis. Underlying pneumonia is not excluded. Electronically Signed   By: Mitzi Hansen M.D.   On: 12/24/2016 23:18    EKG: Normal sinus rhythm at 100 bpm with normal axis and nonspecific ST-T wave changes.  Assessment/Plan Active Problems:   Acute respiratory failure with hypoxia [facility])    This is a 83 y.o. female with a history of DM, HTN, CVA, diastolic CHF, HTN, peripheral neuropathy  now being admitted with: 1. Acute hypoxic respiratory failure likely multifactorial secondary to CHF exacerbation and COPD history - Admit to stepdown with telemetry monitoring, Bipap - Diuresis, intake/output, daily weight. - Continue Coreg, Lasix, Lisinopril.  Add nitropaste - Check lipids and TSH. - Elevated troponin, likely demand ischemia. Trend trops - Cardio consult  2. COPD Exacerbation ?undiagnosed - Nebs, steroids, expectorants - Pulmonary/CCM consult requested.   3. CKD - at baseline, recheck BMP in AM 4. DM - RISS, continue home regimen 5. HTN - continue Coreg, LIsinopril, Lasix,  6. Allergies - Continue Claritin, 7. Hyperlipidemia - Continue Lipitor  Admission status: Inpatient, stepdown IV Fluids: HL Diet/Nutrition: HH, CC Consults called: Pulm  DVT Px: Lovenox, SCDs  and early ambulation Code Status: Full Code  Disposition Plan: To home in 2-3 days   All the records are reviewed and case discussed with ED provider. Management plans discussed with the patient and/or family who express understanding and agree with plan of care.  Consepcion Utt D.O. on 12/25/2016 at 1:25 AM Between 7am to 6pm - Pager - [phone] After 6pm go to [email or web address] - Social research officer, government [facility] Office [phone] CC: Primary care physician; Barbette Reichmann, MD  12/25/2016, 1:25 AM

## 2016-12-25 NOTE — ED Notes (Signed)
Waiting on rt to assist with transport of pt.

## 2016-12-25 NOTE — Progress Notes (Signed)
Pt being transferred to room 245. Report called to Serenity, Charity fundraiserN. Pt and belongings moved to room 245 without incident.

## 2016-12-25 NOTE — Progress Notes (Signed)
[facility] - [place] [facility]   PATIENT NAME: [name] [name]    MR#:  [id number]  DATE OF BIRTH:  March 12, 1934  SUBJECTIVE:   Came in with acute respiratory distress. Patient feels better. REVIEW OF SYSTEMS:   Review of Systems  Constitutional: Negative for chills, fever and weight loss.  HENT: Negative for ear discharge, ear pain and nosebleeds.   Eyes: Negative for blurred vision, pain and discharge.  Respiratory: Positive for shortness of breath. Negative for sputum production and stridor.   Cardiovascular: Negative for chest pain, palpitations, orthopnea and PND.  Gastrointestinal: Negative for abdominal pain, diarrhea, nausea and vomiting.  Genitourinary: Negative for frequency and urgency.  Musculoskeletal: Negative for back pain and joint pain.  Neurological: Positive for weakness. Negative for sensory change, speech change and focal weakness.  Psychiatric/Behavioral: Negative for depression and hallucinations. The patient is not nervous/anxious.    Tolerating Diet:yes Tolerating PT: pending  DRUG ALLERGIES:  No Known Allergies  VITALS:  Blood pressure (!) 156/72, pulse 86, temperature 99.1 F (37.3 C), temperature source Oral, resp. rate (!) 33, height 5\' 3"  (1.6 m), weight 75.4 kg (166 lb 3.6 oz), SpO2 100 %.  PHYSICAL EXAMINATION:   Physical Exam  GENERAL:  83 y.o.-year-old patient lying in the bed with no acute distress.  EYES: Pupils equal, round, reactive to light and accommodation. No scleral icterus. Extraocular muscles intact.  HEENT: Head atraumatic, normocephalic. Oropharynx and nasopharynx clear.  NECK:  Supple, no jugular venous distention. No thyroid enlargement, no tenderness.  LUNGS: decreased breath sounds bilaterally, no wheezing, rales, rhonchi. No use of accessory muscles of respiration.  CARDIOVASCULAR: S1, S2 normal. No murmurs, rubs, or gallops.  ABDOMEN: Soft, nontender, nondistended. Bowel sounds present. No  organomegaly or mass.  EXTREMITIES: No cyanosis, clubbing or edema b/l.    NEUROLOGIC: Cranial nerves II through XII are intact. No focal Motor or sensory deficits b/l.   PSYCHIATRIC:  patient is alert and oriented x 3.  SKIN: No obvious rash, lesion, or ulcer.   LABORATORY PANEL:  CBC  Recent Labs Lab 12/25/16 0440  WBC 8.9  HGB 9.6*  HCT 29.5*  PLT 338    Chemistries   Recent Labs Lab 12/24/16 2244 12/25/16 0440  NA 135 136  K 4.6 4.7  CL 100* 100*  CO2 29 28  GLUCOSE 419* 438*  BUN 25* 24*  CREATININE 1.08* 1.02*  CALCIUM 8.6* 8.4*  MG 2.2  --   AST  --  15  ALT  --  12*  ALKPHOS  --  89  BILITOT  --  0.8   Cardiac Enzymes  Recent Labs Lab 12/25/16 1034  TROPONINI 1.10*   RADIOLOGY:  Dg Chest Port 1 View  Result Date: 12/24/2016 CLINICAL DATA:  83 y/o  F; shortness of breath and wheezing. EXAM: PORTABLE CHEST 1 VIEW COMPARISON:  11/21/2016 chest radiograph FINDINGS: Stable cardiac silhouette within normal limits. Increased reticular markings, diffuse hazy opacities, and obscure a shin of bilateral hemidiaphragms. No pneumothorax. No acute osseous abnormality is evident. IMPRESSION: Diffuse haziness and reticular opacities probably represents mild pulmonary edema. Probable small bilateral effusions with associated basilar atelectasis. Underlying pneumonia is not excluded. Electronically Signed   By: Mitzi Hansen M.D.   On: 12/24/2016 23:18   ASSESSMENT AND PLAN:  83 y.o. female with a history of DM, HTN, CVA, diastolic CHF, HTN, peripheral neuropathy  now being admitted with: 1. Acute hypoxic respiratory failure likely multifactorial secondary to CHF exacerbation and COPD  history -off BIPAP - Diuresis, intake/output, daily weight. - Continue Coreg, Lasix, Lisinopril.  Add nitropaste - Elevated troponin, likely demand ischemia. Trend trops - Cardio consult appreciated -overnite pulse oximetry  2. COPD Exacerbation ?undiagnosed - Nebs,  steroids, expectorants - Pulmonary/CCM consult requested.   3. CKD - at baseline, recheck BMP in AM  4. DM - RISS, continue home regimen  5. HTN - continue Coreg, LIsinopril, Lasix  6. Hyperlipidemia - Continue Lipitor  PT to see CSW for d/c  Case discussed with Care Management/Social Worker. Management plans discussed with the patient, family and they are in agreement.  CODE STATUS: full  DVT Prophylaxis: lovenox TOTAL TIME TAKING CARE OF THIS PATIENT: 30 minutes.  >50% time spent on counselling and coordination of care  POSSIBLE D/C IN1-2 DAYS, DEPENDING ON CLINICAL CONDITION.  Note: This dictation was prepared with Dragon dictation along with smaller phrase technology. Any transcriptional errors that result from this process are unintentional.  Nikie Cid M.D on 12/25/2016 at 12:25 PM  Between 7am to 6pm - Pager - [phone]  After 6pm go to [email or web address] - password [email or web address] [facility]  [facility]  Office  [phone]  CC: Primary care physician; Barbette ReichmannHANDE,VISHWANATH, MD

## 2016-12-25 NOTE — Consult Note (Signed)
[facility] Cardiology  CARDIOLOGY CONSULT NOTE  Patient ID: [name] [name] MRN: [id number] DOB/AGE: [birth year] 83 y.o.  Admit date: 12/24/2016 Referring Physician Allena Katz Primary Physician [facility] Primary Cardiologist  Reason for Consultation Congestive heart failure  HPI: 83 year old female referred for evaluation of congestive heart failure. The patient has known history of diastolic congestive heart failure, essential hypertension, type 2 diabetes, COPD, and prior CVA. The patient has been recently admitted 09/14/2016 and 11/23/2016 for acute on chronic diastolic congestive heart failure and COPD exacerbation. 2-D echocardiogram 11/13/2016 revealed LVEF is 60%. She again presents to [facility] emergency room with shortness of breath when she went to sleep, sent to [facility] emergency room where she was noted to be in respiratory distress, chest x-ray revealing mild pulmonary edema and possible underlying pneumonia, admission labs notable for troponin of 1.08, currently without chest pain, ECG revealing normal sinus rhythm with nonspecific intraventricular conduction delay. The patient's been treated with nebulizer therapy, intravenous steroids, and IV furosemide with overall clinical improvement.  Review of systems complete and found to be negative unless listed above     Past Medical History:  Diagnosis Date  . Diabetes mellitus without complication (HCC)   . Hypertension   . Stroke [facility])     Past Surgical History:  Procedure Laterality Date  . PERIPHERAL VASCULAR CATHETERIZATION N/A 11/14/2016   Procedure: Lower Extremity Angiography;  Surgeon: Annice Needy, MD;  Location: [facility] INVASIVE CV LAB;  Service: Cardiovascular;  Laterality: N/A;  . PERIPHERAL VASCULAR CATHETERIZATION Right 11/21/2016   Procedure: Lower Extremity Angiography;  Surgeon: Annice Needy, MD;  Location: [facility] INVASIVE CV LAB;  Service: Cardiovascular;  Laterality: Right;  . PERIPHERAL VASCULAR CATHETERIZATION N/A 11/21/2016   Procedure:  Abdominal Aortogram w/Lower Extremity;  Surgeon: Annice Needy, MD;  Location: [facility] INVASIVE CV LAB;  Service: Cardiovascular;  Laterality: N/A;  . PERIPHERAL VASCULAR CATHETERIZATION  11/21/2016   Procedure: Lower Extremity Intervention;  Surgeon: Annice Needy, MD;  Location: [facility] INVASIVE CV LAB;  Service: Cardiovascular;;    Prescriptions Prior to Admission  Medication Sig Dispense Refill Last Dose  . acetaminophen (TYLENOL) 325 MG tablet Take 2 tablets (650 mg total) by mouth every 6 (six) hours as needed for mild pain (temp > 101.5).   Taking  . albuterol (PROVENTIL HFA;VENTOLIN HFA) 108 (90 Base) MCG/ACT inhaler Inhale 2 puffs into the lungs every 6 (six) hours as needed for wheezing or shortness of breath. 1 Inhaler 1 Taking  . aspirin EC 81 MG tablet Take 1 tablet by mouth 1 day or 1 dose.   Taking  . atorvastatin (LIPITOR) 10 MG tablet Take 1 tablet by mouth at bedtime.    Taking  . carvedilol (COREG) 3.125 MG tablet Take 1 tablet (3.125 mg total) by mouth 2 (two) times daily with a meal. 30 tablet 0 Taking  . clopidogrel (PLAVIX) 75 MG tablet Take 1 tablet (75 mg total) by mouth daily. 30 tablet 1 Taking  . collagenase (SANTYL) ointment Apply topically daily. 15 g 0 Taking  . diphenhydrAMINE (BENADRYL) 25 mg capsule Take 25 mg by mouth every 6 (six) hours as needed for itching.   Taking  . fluticasone-salmeterol (ADVAIR HFA) 115-21 MCG/ACT inhaler Inhale 2 puffs into the lungs 2 (two) times daily. 1 Inhaler 0 Taking  . furosemide (LASIX) 20 MG tablet Take 1 tablet (20 mg total) by mouth 2 (two) times daily. 60 tablet 0 Taking  . gabapentin (NEURONTIN) 100 MG capsule Take 2 capsules by mouth 2 (two) times daily.  Take 2 capsules (200mg ) twice a day   Taking  . insulin aspart (NOVOLOG) 100 UNIT/ML injection Inject 0-15 Units into the skin 3 (three) times daily with meals. 100 mL 0 Taking  . insulin glargine (LANTUS) 100 UNIT/ML injection Inject 0.2 mLs (20 Units total) into the skin at bedtime.  10 mL 3 Taking  . lisinopril (PRINIVIL,ZESTRIL) 2.5 MG tablet Take 1 tablet (2.5 mg total) by mouth daily. 30 tablet 0 Taking  . loratadine (CLARITIN) 10 MG tablet Take 10 mg by mouth daily.   Taking  . potassium chloride SA (K-DUR,KLOR-CON) 20 MEQ tablet Take 20 mEq by mouth daily.   Taking  . predniSONE (STERAPRED UNI-PAK 21 TAB) 10 MG (21) TBPK tablet Take 1 tablet (10 mg total) by mouth daily. Take 6 tablets by mouth for 1 day followed by  5 tablets by mouth for 1 day followed by  4 tablets by mouth for 1 day followed by  3 tablets by mouth for 1 day followed by  2 tablets by mouth for 1 day followed by  1 tablet by mouth for a day and stop 21 tablet 0 Taking  . Vitamin D, Ergocalciferol, (DRISDOL) 50000 units CAPS capsule Take 1 capsule by mouth once a week.   Taking   Social History   Social History  . Marital status: Married    Spouse name: N/A  . Number of children: N/A  . Years of education: N/A   Occupational History  . Not on file.   Social History Main Topics  . Smoking status: Former Smoker    Types: Cigarettes  . Smokeless tobacco: Never Used  . Alcohol use No  . Drug use: No  . Sexual activity: Not on file   Other Topics Concern  . Not on file   Social History Narrative  . No narrative on file    Family History  Problem Relation Age of Onset  . Stroke Mother   . Stroke Father   . Heart disease Paternal Grandfather       Review of systems complete and found to be negative unless listed above      PHYSICAL EXAM  General: Well developed, well nourished, in no acute distress HEENT:  Normocephalic and atramatic Neck:  No JVD.  Lungs: Clear bilaterally to auscultation and percussion. Heart: HRRR . Normal S1 and S2 without gallops or murmurs.  Abdomen: Bowel sounds are positive, abdomen soft and non-tender  Msk:  Back normal, normal gait. Normal strength and tone for age. Extremities: No clubbing, cyanosis or edema.   Neuro: Alert and oriented X  3. Psych:  Good affect, responds appropriately  Labs:   Lab Results  Component Value Date   WBC 8.9 12/25/2016   HGB 9.6 (L) 12/25/2016   HCT 29.5 (L) 12/25/2016   MCV 86.0 12/25/2016   PLT 338 12/25/2016    Recent Labs Lab 12/25/16 0440  NA 136  K 4.7  CL 100*  CO2 28  BUN 24*  CREATININE 1.02*  CALCIUM 8.4*  PROT 7.5  BILITOT 0.8  ALKPHOS 89  ALT 12*  AST 15  GLUCOSE 438*   Lab Results  Component Value Date   CKTOTAL 368 (H) 11/09/2016   TROPONINI 1.08 (HH) 12/25/2016    Lab Results  Component Value Date   CHOL 118 12/25/2016   Lab Results  Component Value Date   HDL 30 (L) 12/25/2016   Lab Results  Component Value Date   LDLCALC 80 12/25/2016  Lab Results  Component Value Date   TRIG 40 12/25/2016   Lab Results  Component Value Date   CHOLHDL 3.9 12/25/2016   No results found for: LDLDIRECT    Radiology: Dg Chest Port 1 View  Result Date: 12/24/2016 CLINICAL DATA:  83 y/o  F; shortness of breath and wheezing. EXAM: PORTABLE CHEST 1 VIEW COMPARISON:  11/21/2016 chest radiograph FINDINGS: Stable cardiac silhouette within normal limits. Increased reticular markings, diffuse hazy opacities, and obscure a shin of bilateral hemidiaphragms. No pneumothorax. No acute osseous abnormality is evident. IMPRESSION: Diffuse haziness and reticular opacities probably represents mild pulmonary edema. Probable small bilateral effusions with associated basilar atelectasis. Underlying pneumonia is not excluded. Electronically Signed   By: Mitzi HansenLance  Furusawa-Stratton M.D.   On: 12/24/2016 23:18    EKG: Normal sinus rhythm with nonspecific intraventricular conduction with LVH   ASSESSMENT AND PLAN:   1. Respiratory failure, likely multifactorial 2. Acute on chronic diastolic congestive heart failure 3. Elevated troponin, nondiagnostic EKG, in the absence of chest pain, likely demand supply ischemia  Recommendations  1. Continue current medications 2. Defer full  dose anticoagulation at this time 3. Continue to trend troponins 4. Continue diuresis 5. Carefully monitor renal status 6. Continue dual antiplatelet therapy  Signed: Marcina Millardlexander Kasten Leveque MD,PhD, [facility] 12/25/2016, 10:18 AM

## 2016-12-25 NOTE — Progress Notes (Signed)
Overnight pulse ox order noted for this patient.  Testing will have to be performed 1/8.  Equipment already setup for use on another patient with order in place prior to this patient.

## 2016-12-26 ENCOUNTER — Encounter (INDEPENDENT_AMBULATORY_CARE_PROVIDER_SITE_OTHER): Payer: Commercial Managed Care - HMO | Admitting: Vascular Surgery

## 2016-12-26 ENCOUNTER — Other Ambulatory Visit: Payer: Self-pay | Admitting: *Deleted

## 2016-12-26 ENCOUNTER — Encounter (INDEPENDENT_AMBULATORY_CARE_PROVIDER_SITE_OTHER): Payer: Commercial Managed Care - HMO

## 2016-12-26 DIAGNOSIS — L8990 Pressure ulcer of unspecified site, unspecified stage: Secondary | ICD-10-CM | POA: Insufficient documentation

## 2016-12-26 LAB — GLUCOSE, CAPILLARY
GLUCOSE-CAPILLARY: 111 mg/dL — AB (ref 65–99)
GLUCOSE-CAPILLARY: 129 mg/dL — AB (ref 65–99)
GLUCOSE-CAPILLARY: 214 mg/dL — AB (ref 65–99)
GLUCOSE-CAPILLARY: 242 mg/dL — AB (ref 65–99)
Glucose-Capillary: 197 mg/dL — ABNORMAL HIGH (ref 65–99)
Glucose-Capillary: 237 mg/dL — ABNORMAL HIGH (ref 65–99)
Glucose-Capillary: 252 mg/dL — ABNORMAL HIGH (ref 65–99)

## 2016-12-26 LAB — BLOOD GAS, VENOUS
Acid-Base Excess: 3.9 mmol/L — ABNORMAL HIGH (ref 0.0–2.0)
BICARBONATE: 32 mmol/L — AB (ref 20.0–28.0)
PCO2 VEN: 68 mmHg — AB (ref 44.0–60.0)
Patient temperature: 37
pH, Ven: 7.28 (ref 7.250–7.430)

## 2016-12-26 MED ORDER — COLLAGENASE 250 UNIT/GM EX OINT
TOPICAL_OINTMENT | Freq: Every day | CUTANEOUS | Status: DC
  Administered 2016-12-26 – 2016-12-29 (×4): via TOPICAL
  Filled 2016-12-26: qty 30

## 2016-12-26 MED ORDER — INSULIN ASPART 100 UNIT/ML ~~LOC~~ SOLN
0.0000 [IU] | Freq: Three times a day (TID) | SUBCUTANEOUS | Status: DC
  Administered 2016-12-26: 11 [IU] via SUBCUTANEOUS
  Administered 2016-12-26: 4 [IU] via SUBCUTANEOUS
  Administered 2016-12-26: 7 [IU] via SUBCUTANEOUS
  Administered 2016-12-27 – 2016-12-28 (×3): 4 [IU] via SUBCUTANEOUS
  Administered 2016-12-28: 3 [IU] via SUBCUTANEOUS
  Administered 2016-12-29 (×2): 4 [IU] via SUBCUTANEOUS
  Filled 2016-12-26 (×2): qty 4
  Filled 2016-12-26: qty 11
  Filled 2016-12-26 (×2): qty 4
  Filled 2016-12-26: qty 3
  Filled 2016-12-26 (×2): qty 4
  Filled 2016-12-26: qty 7

## 2016-12-26 MED ORDER — FUROSEMIDE 20 MG PO TABS
20.0000 mg | ORAL_TABLET | Freq: Two times a day (BID) | ORAL | Status: DC
  Administered 2016-12-26 – 2016-12-29 (×7): 20 mg via ORAL
  Filled 2016-12-26 (×7): qty 1

## 2016-12-26 MED ORDER — ENOXAPARIN SODIUM 40 MG/0.4ML ~~LOC~~ SOLN
40.0000 mg | SUBCUTANEOUS | Status: DC
  Administered 2016-12-26 – 2016-12-28 (×3): 40 mg via SUBCUTANEOUS
  Filled 2016-12-26 (×3): qty 0.4

## 2016-12-26 MED ORDER — INSULIN GLARGINE 100 UNIT/ML ~~LOC~~ SOLN
20.0000 [IU] | Freq: Every day | SUBCUTANEOUS | Status: DC
  Administered 2016-12-26 – 2016-12-28 (×3): 20 [IU] via SUBCUTANEOUS
  Filled 2016-12-26 (×4): qty 0.2

## 2016-12-26 NOTE — Patient Outreach (Signed)
Call received from [name], SW at facility. She states patient was admitted to hospital over the weekend with breathing issues. She states patient correct exhaust date for Medicare Skilled days is 01/04/17 and that patient vascular appointment was to be today 12/27/16, rescheduled from later in the month. At this time she has not heard back on status of LTC Medicaid either.  Plan to communicate above with care team and with Hospital Liaison. Will sign off but will follow again if patient [facility]-ad[facility] to [facility].   Alben SpittleMary E. Albertha GheeNiemczura, RN, BSN, CCM  [facility] Coordinator [phone]

## 2016-12-26 NOTE — Care Management Important Message (Signed)
Important Message  Patient Details  Name: [name] [name] MRN: [id number] Date of Birth: [birth year]   Medicare Important Message Given:  Yes  Initial signed IM printed from Epic and given to patient.     Eber HongGreene, Darlen Gledhill R, RN 12/26/2016, 9:12 AM

## 2016-12-26 NOTE — Progress Notes (Signed)
[facility]  SUBJECTIVE: "I don't have chest pain"   Vitals:   12/25/16 2006 12/26/16 0054 12/26/16 0353 12/26/16 0620  BP: 128/60 (!) 101/46 (!) 108/55   Pulse: 84 77 73   Resp: 14  16   Temp: 98.7 F (37.1 C)   98 F (36.7 C)  TempSrc: Oral   Oral  SpO2: 100%  100%   Weight:      Height:         Intake/Output Summary (Last 24 hours) at 12/26/16 0813 Last data filed at 12/25/16 [id number]  Gross per 24 hour  Intake              120 ml  Output                0 ml  Net              120 ml      PHYSICAL EXAM  General: Well developed, well nourished, in no acute distress HEENT:  Normocephalic and atramatic Neck:  No JVD.  Lungs: Clear bilaterally to auscultation and percussion. Heart: HRRR . Normal S1 and S2 without gallops or murmurs.  Abdomen: Bowel sounds are positive, abdomen soft and non-tender  Msk:  Back normal, normal gait. Normal strength and tone for age. Extremities: No clubbing, cyanosis or edema.   Neuro: Alert and oriented X 3. Psych:  Good affect, responds appropriately   LABS: Basic Metabolic Panel:  Recent Labs  96/03/5400/06/18 2244 12/25/16 0440  NA 135 136  K 4.6 4.7  CL 100* 100*  CO2 29 28  GLUCOSE 419* 438*  BUN 25* 24*  CREATININE 1.08* 1.02*  CALCIUM 8.6* 8.4*  MG 2.2  --   PHOS 3.2  --    Liver Function Tests:  Recent Labs  12/25/16 0440  AST 15  ALT 12*  ALKPHOS 89  BILITOT 0.8  PROT 7.5  ALBUMIN 2.3*   No results for input(s): LIPASE, AMYLASE in the last 72 hours. CBC:  Recent Labs  12/24/16 2244 12/25/16 0440  WBC 10.9 8.9  HGB 10.2* 9.6*  HCT 31.5* 29.5*  MCV 85.4 86.0  PLT 391 338   Cardiac Enzymes:  Recent Labs  12/25/16 0440 12/25/16 1034 12/25/16 1659  TROPONINI 1.08* 1.10* 0.84*   BNP: Invalid input(s): POCBNP D-Dimer: No results for input(s): DDIMER in the last 72 hours. Hemoglobin A1C: No results for input(s): HGBA1C in the last 72 hours. Fasting Lipid Panel:  Recent Labs  12/25/16 0440   CHOL 118  HDL 30*  LDLCALC 80  TRIG 40  CHOLHDL 3.9   Thyroid Function Tests:  Recent Labs  12/24/16 2244  TSH 1.911   Anemia Panel: No results for input(s): VITAMINB12, FOLATE, FERRITIN, TIBC, IRON, RETICCTPCT in the last 72 hours.  Dg Chest Port 1 View  Result Date: 12/24/2016 CLINICAL DATA:  83 y/o  F; shortness of breath and wheezing. EXAM: PORTABLE CHEST 1 VIEW COMPARISON:  11/21/2016 chest radiograph FINDINGS: Stable cardiac silhouette within normal limits. Increased reticular markings, diffuse hazy opacities, and obscure a shin of bilateral hemidiaphragms. No pneumothorax. No acute osseous abnormality is evident. IMPRESSION: Diffuse haziness and reticular opacities probably represents mild pulmonary edema. Probable small bilateral effusions with associated basilar atelectasis. Underlying pneumonia is not excluded. Electronically Signed   By: Mitzi HansenLance  Furusawa-Stratton M.D.   On: 12/24/2016 23:18     Echo LV ejection fraction 60% by 2-D echocardiogram 11/13/2016  TELEMETRY: Sinus rhythm:  ASSESSMENT AND PLAN:  Active Problems:  Acute respiratory failure with hypoxia (HCC)   Pressure injury of skin    1. Respiratory failure, likely multifactorial 2. Acute on chronic diastolic congestive heart failure, improved after diuresis 3. Mildly elevated troponin, without peak or trough, nondiagnostic ECG, in the absence of chest pain, likely demand supply ischemia  Recommendations  1. Agree with current therapy 2. Defer full dose anticoagulation 3. Continue diuresis 4. Carefully monitor renal status 5. No further cardiac diagnostics at this time  Signed off for now, please call if any questions   Marcina Millard, MD, PhD, [facility] 12/26/2016 8:13 AM

## 2016-12-26 NOTE — Consult Note (Signed)
WOC Nurse wound consult note  Reason for Consult: Unstageable pressure injury to left heel.  S/P revascularization.   Wound type: Full thickness neuropathic injury complicated by pressue and diabetes.  Right malleolus unstageable pressure injury 1 cm x 0.5 cm x 0.2 cm thin slough present to wound bed.  Pressure Ulcer POA: Yes Measurement: 3 cm x 3 cm intact dry eschar. With peeling epithelium noted to periwound.  Wound bed:100% dry eschar Drainage (amount, consistency, odor) No drainage. Musty odor.  PeriwoundIntact Dressing procedure/placement/frequency:Vascular supply has been improved.   Wound is dry and intact.  Will not debride as this is acting as a protective covering at this time.  IF begins to drain or have fluctuance, erythema, would begin debridement.   Right malleolus:  Cleanse with NS and pat gently dry.  Apply Santyl to wound bed.  Cover with NS moist gauze.  Secure with 4x4 gauze and kerlix/tape.  Change daily.  Will not follow at this time.  Please re-consult if needed.  Maple HudsonKaren Eun Vermeer RN BSN CWON Pager [phone]

## 2016-12-26 NOTE — Care Management (Signed)
[facility] [employer]  CSW is involved

## 2016-12-26 NOTE — Plan of Care (Signed)
Problem: Education: Goal: Knowledge of [facility] Education information/materials will improve Outcome: Not Progressing Patient has a history of dementia   

## 2016-12-26 NOTE — Progress Notes (Signed)
Inpatient Diabetes Program Recommendations  AACE/ADA: New Consensus Statement on Inpatient Glycemic Control (2015)  Target Ranges:  Prepandial:   less than 140 mg/dL      Peak postprandial:   less than 180 mg/dL (1-2 hours)      Critically ill patients:  140 - 180 mg/dL   Results for [name] [name], [name] (MRN [id number]) as of 12/26/2016 08:28  Ref. Range 12/25/2016 06:07 12/25/2016 07:18 12/25/2016 11:26 12/25/2016 17:08 12/25/2016 20:03 12/26/2016 00:52 12/26/2016 03:54 12/26/2016 07:59  Glucose-Capillary Latest Ref Range: 65 - 99 mg/dL 409420 (H) 811361 (H) 914203 (H) 237 (H) 303 (H) 237 (H) 214 (H) 111 (H)   Review of Glycemic Control  Diabetes history: DM2 Outpatient Diabetes medications: Lantus 20 units QHS, Novolog 0-15 units TID with meals Current orders for Inpatient glycemic control: Novolog 0-20 units Q4H  Inpatient Diabetes Program Recommendations: Insulin - Basal: Over the past 26 hours, glucose has ranged from 111-420 mg/dl and patient has received a total of Novolog 79 units for correction. Please consider ordering Lantus 20 units Q24H starting now. Correction (SSI): If patient is eating, please consider changing frequency of CBGs and Novolog correction to ACHS.  Thanks, Orlando PennerMarie Chandler Stofer, RN, MSN, CDE Diabetes Coordinator Inpatient Diabetes Program [phone] (Team Pager from 8am to 5pm)

## 2016-12-26 NOTE — NC FL2 (Signed)
[place] MEDICAID FL2 LEVEL OF CARE SCREENING TOOL     IDENTIFICATION  Patient Name: [name] [name] Birthdate: 1934-06-04 Sex: female Admission Date (Current Location): 12/24/2016  [place]   Facility and Address:  [facility], [address], [place], [place] [address]      Provider Number: [id number]  Attending Physician Name and Address:  Enedina FinnerSona Patel, MD  Relative Name and Phone Number:  Addison Baileyarker,[name] Daughter [phone]     Current Level of Care: Hospital Recommended Level of Care: Skilled Nursing Facility Prior Approval Number:    Date Approved/Denied:   PASRR Number: [id number]  Discharge Plan: SNF    Current Diagnoses: Patient Active Problem List   Diagnosis Date Noted  . Pressure injury of skin 12/26/2016  . Acute respiratory distress 11/21/2016  . 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 Acute respiratory failure with hypoxia (HCC) 09/15/2016  . Carpal tunnel syndrome 09/01/2016  . Hypertension 09/01/2016  . PVD (peripheral vascular disease) (HCC) 09/01/2016  . Open abdominal wall wound 09/01/2016  . Abscess 08/21/2016  . Type 2 diabetes mellitus with complication (HCC) 05/20/2015  . Visual disturbance 05/20/2015  . H/O: CVA (cerebrovascular accident) 01/21/2015  . Difficulty in walking 08/27/2014  . Diabetes (HCC) 08/20/2014  . Diabetic neuropathy (HCC) 08/20/2014  . Dizzy spells 08/20/2014  . Hand numbness 08/20/2014  . Obesity 08/20/2014    Orientation RESPIRATION BLADDER Height & Weight     Self, Place  Normal Incontinent Weight: 166 lb 3.6 oz (75.4 kg) Height:  5\' 3"  (160 cm)  BEHAVIORAL SYMPTOMS/MOOD NEUROLOGICAL BOWEL NUTRITION STATUS      Incontinent Diet (Carb Modified)  AMBULATORY STATUS COMMUNICATION OF NEEDS Skin   Limited Assist Verbally PU Stage and Appropriate Care (Unstageable)                       Personal Care Assistance Level of  Assistance  Bathing, Feeding, Dressing Bathing Assistance: Limited assistance Feeding assistance: Independent Dressing Assistance: Limited assistance     Functional Limitations Info  Sight, Hearing, Speech Sight Info: Adequate Hearing Info: Adequate Speech Info: Adequate    SPECIAL CARE FACTORS FREQUENCY  PT (By licensed PT)     PT Frequency: 5x a week              Contractures Contractures Info: Not present    Additional Factors Info  Insulin Sliding Scale, Allergies, Code Status Code Status Info: Full Allergies Info: NKA   Insulin Sliding Scale Info: insulin aspart (novoLOG) injection 0-20 Units       Current Medications (12/26/2016):  This is the current hospital active medication list Current Facility-Administered Medications  Medication Dose Route Frequency Provider Last Rate Last Dose  . 0.9 %  sodium chloride infusion  250 mL Intravenous PRN Alexis Hugelmeyer, DO      . acetaminophen (TYLENOL) tablet 650 mg  650 mg Oral Q6H PRN Alexis Hugelmeyer, DO       Or  . acetaminophen (TYLENOL) suppository 650 mg  650 mg Rectal Q6H PRN Alexis Hugelmeyer, DO      . aspirin EC tablet 81 mg  81 mg Oral 1 day or 1 dose Alexis Hugelmeyer, DO   81 mg at 12/26/16 0826  . atorvastatin (LIPITOR) tablet 10 mg  10 mg Oral QHS Alexis Hugelmeyer, DO   10 mg at 12/25/16 2016  . bisacodyl (DULCOLAX) EC tablet 5 mg  5 mg Oral Daily PRN Jon GillsAlexis  Hugelmeyer, DO      . carvedilol (COREG) tablet 3.125 mg  3.125 mg Oral BID WC Alexis Hugelmeyer, DO   3.125 mg at 12/26/16 0825  . clopidogrel (PLAVIX) tablet 75 mg  75 mg Oral Daily Alexis Hugelmeyer, DO   75 mg at 12/26/16 0825  . collagenase (SANTYL) ointment   Topical Daily Enedina Finner, MD      . dextromethorphan (DELSYM) 30 MG/5ML liquid 30 mg  30 mg Oral BID Alexis Hugelmeyer, DO   30 mg at 12/26/16 0826   And  . guaiFENesin (MUCINEX) 12 hr tablet 600 mg  600 mg Oral BID Alexis Hugelmeyer, DO   600 mg at 12/26/16 0825  . enoxaparin (LOVENOX)  injection 40 mg  40 mg Subcutaneous Q24H Enedina Finner, MD      . furosemide (LASIX) tablet 20 mg  20 mg Oral BID Enedina Finner, MD   20 mg at 12/26/16 0825  . gabapentin (NEURONTIN) capsule 200 mg  200 mg Oral BID Alexis Hugelmeyer, DO   200 mg at 12/26/16 0825  . insulin aspart (novoLOG) injection 0-20 Units  0-20 Units Subcutaneous TID AC & HS Enedina Finner, MD   7 Units at 12/26/16 1200  . insulin glargine (LANTUS) injection 20 Units  20 Units Subcutaneous QHS Enedina Finner, MD      . ipratropium-albuterol (DUONEB) 0.5-2.5 (3) MG/3ML nebulizer solution 3 mL  3 mL Nebulization Q6H PRN Alexis Hugelmeyer, DO      . lisinopril (PRINIVIL,ZESTRIL) tablet 2.5 mg  2.5 mg Oral Daily Alexis Hugelmeyer, DO   2.5 mg at 12/26/16 0825  . loratadine (CLARITIN) tablet 10 mg  10 mg Oral Daily Alexis Hugelmeyer, DO   10 mg at 12/26/16 0825  . magnesium citrate solution 1 Bottle  1 Bottle Oral Once PRN Alexis Hugelmeyer, DO      . mometasone-formoterol (DULERA) 200-5 MCG/ACT inhaler 2 puff  2 puff Inhalation BID Alexis Hugelmeyer, DO   2 puff at 12/26/16 0826  . ondansetron (ZOFRAN) tablet 4 mg  4 mg Oral Q6H PRN Alexis Hugelmeyer, DO       Or  . ondansetron (ZOFRAN) injection 4 mg  4 mg Intravenous Q6H PRN Alexis Hugelmeyer, DO      . oxyCODONE (Oxy IR/ROXICODONE) immediate release tablet 5 mg  5 mg Oral Q4H PRN Alexis Hugelmeyer, DO      . potassium chloride SA (K-DUR,KLOR-CON) CR tablet 20 mEq  20 mEq Oral Daily Alexis Hugelmeyer, DO   20 mEq at 12/26/16 0825  . senna-docusate (Senokot-S) tablet 1 tablet  1 tablet Oral QHS PRN Alexis Hugelmeyer, DO      . sodium chloride flush (NS) 0.9 % injection 3 mL  3 mL Intravenous Q12H Alexis Hugelmeyer, DO   3 mL at 12/26/16 1000  . sodium chloride flush (NS) 0.9 % injection 3 mL  3 mL Intravenous Q12H Alexis Hugelmeyer, DO   3 mL at 12/26/16 1000  . sodium chloride flush (NS) 0.9 % injection 3 mL  3 mL Intravenous PRN Alexis Hugelmeyer, DO      . Vitamin D (Ergocalciferol)  (DRISDOL) capsule 50,000 Units  50,000 Units Oral Weekly Alexis Hugelmeyer, DO   50,000 Units at 12/26/16 1610     Discharge Medications: Please see discharge summary for a list of discharge medications.  Relevant Imaging Results:  Relevant Lab Results:   Additional Information SSN [id number]  Darleene Cleaver, [place]

## 2016-12-26 NOTE — Clinical Social Work Note (Signed)
Clinical Social Work Assessment  Patient Details  Name: [name] [name] MRN: [id number] Date of Birth: 1934-11-10  Date of referral:  12/26/16               Reason for consult:  Facility Placement                Permission sought to share information with:  Family Supports Permission granted to share information::  Yes, Verbal Permission Granted  Name::     [name] [name],[name] [name] Daughter [phone]   Agency::  SNF admissions  Relationship::     Contact Information:     Housing/Transportation Living arrangements for the past 2 months:  Skilled Nursing Facility Source of Information:  Patient Patient Interpreter Needed:  None Criminal Activity/Legal Involvement Pertinent to Current Situation/Hospitalization:  No - Comment as needed Significant Relationships:  Adult Children Lives with:  Adult Children Do you feel safe going back to the place where you live?  No Need for family participation in patient care:  Yes (Comment) (Patient has some confusion at times.)  Care giving concerns:  Patient feels she needs to continue with her rehab so she can get strong enough to return back home.   Social Worker assessment / plan:  [name] a 83 year old female who is alert and oriented x2.  Patient has been at [facility] getting some short term rehab, patient plans to get her strength up so she can return back home with her daughter Judeth CornfieldStephanie.  Patient talked about her goals about getting strong to return back home she also talked about her history of raising her kids and working hard to make sure they get through school.  Patient was reminded the process of looking for SNF bed placement, CSW also confirmed that she would like to return back to [facility] pending insurance approval.  Patient expressed that she is planning to work really hard to gain her strength back up to continue with therapy in order to return back home.  Patient did not express any other concerns about returning back to  SNF.  Employment status:  Retired Database administratornsurance information:  Managed Medicare PT Recommendations:  Skilled Nursing Facility Information / Referral to community resources:  Skilled Nursing Facility  Patient/Family's Response to care:  Patient in agreement to going back to [facility].  Patient/Family's Understanding of and Emotional Response to Diagnosis, Current Treatment, and Prognosis: [name] motivated to return back to SNF to continue with her therapy so she can get strong enough to return back home.  Emotional Assessment Appearance:  Appears stated age Attitude/Demeanor/Rapport:    Affect (typically observed):  Calm, Hopeful, Stable, Pleasant Orientation:  Oriented to Self, Oriented to Place Alcohol / Substance use:  Not Applicable Psych involvement (Current and /or in the community):  No (Comment)  Discharge Needs  Concerns to be addressed:  Lack of Support Readmission within the last 30 days:  No Current discharge risk:  Lack of support system Barriers to Discharge:  Insurance Authorization   [place] [employer], Hend Mccarrell R, LCSWA 12/26/2016, 5:19 PM

## 2016-12-26 NOTE — Progress Notes (Signed)
[facility] [place] at [facility]   PATIENT NAME: [name] [name]    MR#:  [id number]  DATE OF BIRTH:  Jan 11, 1934  SUBJECTIVE:   Came in with acute respiratory distress. Patient feels better. Sats 100 % on 2liters---wean to RA today REVIEW OF SYSTEMS:   Review of Systems  Constitutional: Negative for chills, fever and weight loss.  HENT: Negative for ear discharge, ear pain and nosebleeds.   Eyes: Negative for blurred vision, pain and discharge.  Respiratory: Positive for shortness of breath. Negative for sputum production and stridor.   Cardiovascular: Negative for chest pain, palpitations, orthopnea and PND.  Gastrointestinal: Negative for abdominal pain, diarrhea, nausea and vomiting.  Genitourinary: Negative for frequency and urgency.  Musculoskeletal: Negative for back pain and joint pain.  Neurological: Positive for weakness. Negative for sensory change, speech change and focal weakness.  Psychiatric/Behavioral: Negative for depression and hallucinations. The patient is not nervous/anxious.    Tolerating Diet:yes Tolerating PT: pending  DRUG ALLERGIES:  No Known Allergies  VITALS:  Blood pressure (!) 108/55, pulse 73, temperature 98 F (36.7 C), temperature source Oral, resp. rate 16, height 5\' 3"  (1.6 m), weight 75.4 kg (166 lb 3.6 oz), SpO2 100 %.  PHYSICAL EXAMINATION:   Physical Exam  GENERAL:  83 y.o.-year-old patient lying in the bed with no acute distress.  EYES: Pupils equal, round, reactive to light and accommodation. No scleral icterus. Extraocular muscles intact.  HEENT: Head atraumatic, normocephalic. Oropharynx and nasopharynx clear.  NECK:  Supple, no jugular venous distention. No thyroid enlargement, no tenderness.  LUNGS: decreased breath sounds bilaterally, no wheezing, rales, rhonchi. No use of accessory muscles of respiration.  CARDIOVASCULAR: S1, S2 normal. No murmurs, rubs, or gallops.  ABDOMEN: Soft, nontender,  nondistended. Bowel sounds present. No organomegaly or mass.  EXTREMITIES: No cyanosis, clubbing or edema b/l.    NEUROLOGIC: Cranial nerves II through XII are intact. No focal Motor or sensory deficits b/l.   PSYCHIATRIC:  patient is alert and oriented x 3.  SKIN: No obvious rash, lesion, or ulcer.   LABORATORY PANEL:  CBC  Recent Labs Lab 12/25/16 0440  WBC 8.9  HGB 9.6*  HCT 29.5*  PLT 338    Chemistries   Recent Labs Lab 12/24/16 2244 12/25/16 0440  NA 135 136  K 4.6 4.7  CL 100* 100*  CO2 29 28  GLUCOSE 419* 438*  BUN 25* 24*  CREATININE 1.08* 1.02*  CALCIUM 8.6* 8.4*  MG 2.2  --   AST  --  15  ALT  --  12*  ALKPHOS  --  89  BILITOT  --  0.8   Cardiac Enzymes  Recent Labs Lab 12/25/16 1659  TROPONINI 0.84*   RADIOLOGY:  Dg Chest Port 1 View  Result Date: 12/24/2016 CLINICAL DATA:  83 y/o  F; shortness of breath and wheezing. EXAM: PORTABLE CHEST 1 VIEW COMPARISON:  11/21/2016 chest radiograph FINDINGS: Stable cardiac silhouette within normal limits. Increased reticular markings, diffuse hazy opacities, and obscure a shin of bilateral hemidiaphragms. No pneumothorax. No acute osseous abnormality is evident. IMPRESSION: Diffuse haziness and reticular opacities probably represents mild pulmonary edema. Probable small bilateral effusions with associated basilar atelectasis. Underlying pneumonia is not excluded. Electronically Signed   By: Mitzi Hansen M.D.   On: 12/24/2016 23:18   ASSESSMENT AND PLAN:  83 y.o. female with a history of DM, HTN, CVA, diastolic CHF, HTN, peripheral neuropathy  now being admitted with: 1. Acute hypoxic respiratory failure likely  multifactorial secondary to CHF exacerbation and COPD history -off BIPAP - Diuresis, intake/output, daily weight. - Continue Coreg, Lasix, Lisinopril.   -change to oral lasix, wean oxygen to RA - Elevated troponin, likely demand ischemia. Trend trops - Cardio consult appreciated -overnite  pulse oximetry tonite  2. COPD/asthma Exacerbation -mild  - Nebs prn, expectorants  3. CKD III-- at baseline -creat stable  4. DM - SSI, continue home regimen  5. HTN - continue Coreg, LIsinopril, Lasix  6. Hyperlipidemia - Continue Lipitor  PT to see CSW for d/c  Case discussed with Care Management/Social Worker. Management plans discussed with the patient, family and they are in agreement.  CODE STATUS: full  DVT Prophylaxis: lovenox TOTAL TIME TAKING CARE OF THIS PATIENT: 30 minutes.  >50% time spent on counselling and coordination of care  POSSIBLE D/C IN1-2 DAYS, DEPENDING ON CLINICAL CONDITION.  Note: This dictation was prepared with Dragon dictation along with smaller phrase technology. Any transcriptional errors that result from this process are unintentional.  Jovonna Nickell M.D on 12/26/2016 at 7:49 AM  Between 7am to 6pm - Pager - [phone]  After 6pm go to [email or web address] - password [email or web address] [facility]  [facility]  Office  [phone]  CC: Primary care physician; Barbette ReichmannHANDE,VISHWANATH, MD

## 2016-12-26 NOTE — Evaluation (Signed)
Physical Therapy Evaluation Patient Details Name: [name] [name] MRN: [id number] DOB: 1934-07-17 Today's Date: 12/26/2016   History of Present Illness  presented to ER from [facility] secondary to acute SOB; admitted with acute respiratory failure secondary to CHF, COPD exacebation.  Noted with mild elevation in troponin (peak 1.10), demand ischemia per notes.  Of note patient with mutiple recent hospitalizations (last two in 08/2016, 11/2016) with similar couse.  Clinical Impression  Upon evaluation, patient alert and oriented to self, location only; generally confused with difficulty maintaining topic of conversation at times.  Bilat UE/LEs globally weak and deconditioned, residual L UE > LE hemiparesis evident from previous CVA.  Static sitting balance with progressive L lateral lean, requiring min assist from therapist for correction to midline.  Currently requiring mod assist +2 for sit/stand, basic transfers and very short-distance gait (5') with RW.  Heavy posterior lean/weight shift and absent spontaneous righting reacitons.  Very high fall risk. Would benefit from skilled PT to address above deficits and promote optimal return to PLOF; recommend transition to STR upon discharge from acute hospitalization.     Follow Up Recommendations SNF    Equipment Recommendations  Rolling walker with 5" wheels    Recommendations for Other Services       Precautions / Restrictions Precautions Precautions: Fall Restrictions Weight Bearing Restrictions: No      Mobility  Bed Mobility Overal bed mobility: Needs Assistance Bed Mobility: Supine to Sit     Supine to sit: Mod assist        Transfers Overall transfer level: Needs assistance Equipment used: Rolling walker (2 wheeled) Transfers: Sit to/from Stand Sit to Stand: Mod assist;+2 physical assistance         General transfer comment: heavy posterior lean with absent spontaneous righting  reactions  Ambulation/Gait Ambulation/Gait assistance: Mod assist;+2 physical assistance Ambulation Distance (Feet): 5 Feet Assistive device: Rolling walker (2 wheeled)       General Gait Details: broad BOS, posterior weight shift; mild steppage gait bilat (L > R); poor balance  Stairs            Wheelchair Mobility    Modified Rankin (Stroke Patients Only)       Balance Overall balance assessment: Needs assistance Sitting-balance support: No upper extremity supported Sitting balance-Leahy Scale: Fair Sitting balance - Comments: verbal cuing, min assist from therapist for correction to midlien Postural control: Left lateral lean Standing balance support: Bilateral upper extremity supported Standing balance-Leahy Scale: Poor                               Pertinent Vitals/Pain Pain Assessment: No/denies pain    Home Living Family/patient expects to be discharged to:: Skilled nursing facility                 Additional Comments: Recently at [facility] for STR; patient unable to accurately describe recent course of therapy    Prior Function           Comments: Per patient, mod indep with SPC at baseline; unable to accurately relate most recent functional status due to cognitive deficits     Hand Dominance   Dominant Hand: Right    Extremity/Trunk Assessment   Upper Extremity Assessment Upper Extremity Assessment: Generalized weakness (L UE 3/5, R UE 4-/5)    Lower Extremity Assessment Lower Extremity Assessment:  (L LE 3-/5, R LE 4/5)       Communication   Communication:  No difficulties  Cognition Arousal/Alertness: Awake/alert Behavior During Therapy: WFL for tasks assessed/performed Overall Cognitive Status: History of cognitive impairments - at baseline                 General Comments: oriented to self, location only; generally follows one-step commands; intermittently confused and off-topic    General  Comments      Exercises     Assessment/Plan    PT Assessment Patient needs continued PT services  PT Problem List Decreased strength;Decreased range of motion;Decreased activity tolerance;Decreased balance;Decreased mobility;Decreased coordination;Decreased cognition;Decreased knowledge of use of DME;Decreased safety awareness;Decreased knowledge of precautions;Cardiopulmonary status limiting activity          PT Treatment Interventions DME instruction;Gait training;Functional mobility training;Therapeutic activities;Therapeutic exercise;Balance training;Neuromuscular re-education;Patient/family education    PT Goals (Current goals can be found in the Care Plan section)  Acute Rehab PT Goals Patient Stated Goal: to return to rehab PT Goal Formulation: With patient Time For Goal Achievement: 12/27/16 Potential to Achieve Goals: Fair    Frequency Min 2X/week   Barriers to discharge        Co-evaluation               End of Session Equipment Utilized During Treatment: Gait belt Activity Tolerance: Patient limited by fatigue Patient left: in chair;with chair alarm set;with call bell/phone within reach Nurse Communication: Mobility status         Time: [id number] PT Time Calculation (min) (ACUTE ONLY): 23 min   Charges:   PT Evaluation $PT Eval Low Complexity: 1 Procedure     PT G Codes:        Jayion Schneck H. Manson PasseyBrown, PT, DPT, NCS 12/26/16, 10:39 PM [phone]

## 2016-12-27 ENCOUNTER — Other Ambulatory Visit: Payer: Self-pay | Admitting: *Deleted

## 2016-12-27 LAB — GLUCOSE, CAPILLARY
GLUCOSE-CAPILLARY: 93 mg/dL (ref 65–99)
GLUCOSE-CAPILLARY: 96 mg/dL (ref 65–99)
GLUCOSE-CAPILLARY: 180 mg/dL — AB (ref 65–99)
Glucose-Capillary: 124 mg/dL — ABNORMAL HIGH (ref 65–99)

## 2016-12-27 MED ORDER — ASPIRIN EC 81 MG PO TBEC
81.0000 mg | DELAYED_RELEASE_TABLET | Freq: Every day | ORAL | Status: DC
  Administered 2016-12-28 – 2016-12-29 (×2): 81 mg via ORAL
  Filled 2016-12-27 (×2): qty 1

## 2016-12-27 NOTE — Patient Outreach (Signed)
[facility]) Care Management  12/27/2016  [name] [name] 09-05-1934 [id number]   Phone call to patient's niece [name] [phone] to discuss patient's long term care plan and to provide assistance if needed.  Voicemail message left for a return call.   Adriana ReamsChrystal Land, LCSW [facility] Care Management [phone]

## 2016-12-27 NOTE — Discharge Instructions (Signed)
Pt needs to be on Oxygen daily at bedtime 2 liters/min and daytime ONLY as needed

## 2016-12-27 NOTE — Discharge Summary (Addendum)
[facility] - [place] at [facility]   PATIENT NAME: [name] [name]    MR#:  [id number]  DATE OF BIRTH:  [birth year]  DATE OF ADMISSION:  12/24/2016 ADMITTING PHYSICIAN: Tonye RoyaltyAlexis Hugelmeyer, DO  DATE OF DISCHARGE:   PRIMARY CARE PHYSICIAN: Barbette ReichmannHANDE,VISHWANATH, MD    ADMISSION DIAGNOSIS:  COPD exacerbation (HCC) [J44.1] Acute on chronic congestive heart failure, unspecified congestive heart failure type (HCC) [I50.9]  DISCHARGE DIAGNOSIS:  Acute on chronic respiratory failure due to CHF diastolic COPD stable DM-2 Hypoxia -now on oxygen at bedtime Chronic ulcers present on admission SECONDARY DIAGNOSIS:   Past Medical History:  Diagnosis Date  . Diabetes mellitus without complication (HCC)   . Hypertension   . Stroke [facility](HCC)     HOSPITAL COURSE:  83 y.o.femalewith a history of DM, HTN, CVA, diastolic CHF, HTN, peripheral neuropathy now being admitted with:  1. Acute hypoxic respiratory failure likely multifactorial secondary to CHF exacerbation and COPD history -off BIPAP - Diuresis, intake/output, daily weight. - Continue Coreg, Lasix, Lisinopril.  -change to oral lasix, wean oxygen to RA - Elevated troponin, likely demand ischemia.  - Cardio consult appreciated -overnite pulse oximetry s/o hypoxia will get bedtime oxygen  2. COPD/asthma Exacerbation -mild  - Nebs prn, expectorants  3. CKD III-- at baseline -creat stable  4. DM - SSI, continue home regimen  5. HTN - continue Coreg, LIsinopril, Lasix  6. Hyperlipidemia - Continue Lipitor  7. Chronic pressure ulcers present on admission -follow wound care instrcutions Overall stable for d/c to rehab CONSULTS OBTAINED:  Treatment Team:  Marcina MillardAlexander Paraschos, MD Yevonne PaxSaadat A Khan, MD Mertie MooresHerbon E Fleming, MD  DRUG ALLERGIES:  No Known Allergies  DISCHARGE MEDICATIONS:   Current Discharge Medication List    CONTINUE these medications which have NOT CHANGED   Details  acetaminophen  (TYLENOL) 325 MG tablet Take 2 tablets (650 mg total) by mouth every 6 (six) hours as needed for mild pain (temp > 101.5).    albuterol (PROVENTIL HFA;VENTOLIN HFA) 108 (90 Base) MCG/ACT inhaler Inhale 2 puffs into the lungs every 6 (six) hours as needed for wheezing or shortness of breath. Qty: 1 Inhaler, Refills: 1    aspirin EC 81 MG tablet Take 1 tablet by mouth 1 day or 1 dose.    atorvastatin (LIPITOR) 10 MG tablet Take 1 tablet by mouth at bedtime.     carvedilol (COREG) 3.125 MG tablet Take 1 tablet (3.125 mg total) by mouth 2 (two) times daily with a meal. Qty: 30 tablet, Refills: 0    clopidogrel (PLAVIX) 75 MG tablet Take 1 tablet (75 mg total) by mouth daily. Qty: 30 tablet, Refills: 1    collagenase (SANTYL) ointment Apply topically daily. Qty: 15 g, Refills: 0    diphenhydrAMINE (BENADRYL) 25 mg capsule Take 25 mg by mouth every 6 (six) hours as needed for itching.    fluticasone-salmeterol (ADVAIR HFA) 115-21 MCG/ACT inhaler Inhale 2 puffs into the lungs 2 (two) times daily. Qty: 1 Inhaler, Refills: 0    furosemide (LASIX) 20 MG tablet Take 1 tablet (20 mg total) by mouth 2 (two) times daily. Qty: 60 tablet, Refills: 0    gabapentin (NEURONTIN) 100 MG capsule Take 2 capsules by mouth 2 (two) times daily. Take 2 capsules (200mg ) twice a day    insulin aspart (NOVOLOG) 100 UNIT/ML injection Inject 0-15 Units into the skin 3 (three) times daily with meals. Qty: 100 mL, Refills: 0    insulin glargine (LANTUS) 100 UNIT/ML injection Inject 0.2  mLs (20 Units total) into the skin at bedtime. Qty: 10 mL, Refills: 3    lisinopril (PRINIVIL,ZESTRIL) 2.5 MG tablet Take 1 tablet (2.5 mg total) by mouth daily. Qty: 30 tablet, Refills: 0    loratadine (CLARITIN) 10 MG tablet Take 10 mg by mouth daily.    potassium chloride SA (K-DUR,KLOR-CON) 20 MEQ tablet Take 20 mEq by mouth daily.    predniSONE (STERAPRED UNI-PAK 21 TAB) 10 MG (21) TBPK tablet Take 1 tablet (10 mg total)  by mouth daily. Take 6 tablets by mouth for 1 day followed by  5 tablets by mouth for 1 day followed by  4 tablets by mouth for 1 day followed by  3 tablets by mouth for 1 day followed by  2 tablets by mouth for 1 day followed by  1 tablet by mouth for a day and stop Qty: 21 tablet, Refills: 0    Vitamin D, Ergocalciferol, (DRISDOL) 50000 units CAPS capsule Take 1 capsule by mouth once a week.        If you experience worsening of your admission symptoms, develop shortness of breath, life threatening emergency, suicidal or homicidal thoughts you must seek medical attention immediately by calling 911 or calling your MD immediately  if symptoms less severe.  You Must read complete instructions/literature along with all the possible adverse reactions/side effects for all the Medicines you take and that have been prescribed to you. Take any new Medicines after you have completely understood and accept all the possible adverse reactions/side effects.   Please note  You were cared for by a hospitalist during your hospital stay. If you have any questions about your discharge medications or the care you received while you were in the hospital after you are discharged, you can call the unit and asked to speak with the hospitalist on call if the hospitalist that took care of you is not available. Once you are discharged, your primary care physician will handle any further medical issues. Please note that NO REFILLS for any discharge medications will be authorized once you are discharged, as it is imperative that you return to your primary care physician (or establish a relationship with a primary care physician if you do not have one) for your aftercare needs so that they can reassess your need for medications and monitor your lab values. Today   SUBJECTIVE   Doing well  VITAL SIGNS:  Blood pressure (!) 140/50, pulse 81, temperature 98.2 F (36.8 C), temperature source Oral, resp. rate 14, height  5\' 3"  (1.6 m), weight 74.8 kg (164 lb 12.8 oz), SpO2 97 %.  I/O:   Intake/Output Summary (Last 24 hours) at 12/27/16 1225 Last data filed at 12/27/16 1100  Gross per 24 hour  Intake              250 ml  Output                0 ml  Net              250 ml    PHYSICAL EXAMINATION:  GENERAL:  83 y.o.-year-old patient lying in the bed with no acute distress.  EYES: Pupils equal, round, reactive to light and accommodation. No scleral icterus. Extraocular muscles intact.  HEENT: Head atraumatic, normocephalic. Oropharynx and nasopharynx clear.  NECK:  Supple, no jugular venous distention. No thyroid enlargement, no tenderness.  LUNGS: Normal breath sounds bilaterally, no wheezing, rales,rhonchi or crepitation. No use of accessory muscles of respiration.  CARDIOVASCULAR:  S1, S2 normal. No murmurs, rubs, or gallops.  ABDOMEN: Soft, non-tender, non-distended. Bowel sounds present. No organomegaly or mass.  EXTREMITIES: No pedal edema, cyanosis, or clubbing. Chronic leg ulcer NEUROLOGIC: Cranial nerves II through XII are intact. Muscle strength 5/5 in all extremities. Sensation intact. Gait not checked.  PSYCHIATRIC: The patient is alert and oriented x 3.  SKIN: No obvious rash, lesion, or ulcer.  Right malleolus unstageable pressure injury 1 cm x 0.5 cm x 0.2 cm thin slough present to wound bed.  Pressure Ulcer POA: Yes Measurement: 3 cm x 3 cm intact dry eschar. With peeling epithelium noted to periwound.  Wound bed:100% dry eschar Drainage (amount, consistency, odor) No drainage. Musty odor.  PeriwoundIntact  DATA REVIEW:   CBC   Recent Labs Lab 12/25/16 0440  WBC 8.9  HGB 9.6*  HCT 29.5*  PLT 338    Chemistries   Recent Labs Lab 12/24/16 2244 12/25/16 0440  NA 135 136  K 4.6 4.7  CL 100* 100*  CO2 29 28  GLUCOSE 419* 438*  BUN 25* 24*  CREATININE 1.08* 1.02*  CALCIUM 8.6* 8.4*  MG 2.2  --   AST  --  15  ALT  --  12*  ALKPHOS  --  89  BILITOT  --  0.8     Microbiology Results   Recent Results (from the past 240 hour(s))  Culture, blood (Routine X 2) w Reflex to ID Panel     Status: None (Preliminary result)   Collection Time: 12/24/16 10:44 PM  Result Value Ref Range Status   Specimen Description BLOOD LEFT ARM  Final   Special Requests BOTTLES DRAWN AEROBIC AND ANAEROBIC 8CCAERO,3CCANA  Final   Culture  Setup Time   Final    GRAM POSITIVE COCCI CRITICAL RESULT CALLED TO, READ BACK BY AND VERIFIED WITH: Matt McBane @ 2300 12/26/16 by [facility]    Culture GRAM POSITIVE COCCI  Final   Report Status PENDING  Incomplete  Culture, blood (Routine X 2) w Reflex to ID Panel     Status: None (Preliminary result)   Collection Time: 12/24/16 10:44 PM  Result Value Ref Range Status   Specimen Description BLOOD RIGHT ARM  Final   Special Requests BOTTLES DRAWN AEROBIC AND ANAEROBIC 5CCAERO,3CCANA  Final   Culture NO GROWTH 2 DAYS  Final   Report Status PENDING  Incomplete  MRSA PCR Screening     Status: None   Collection Time: 12/25/16  2:37 AM  Result Value Ref Range Status   MRSA by PCR NEGATIVE NEGATIVE Final    Comment:        The GeneXpert MRSA Assay (FDA approved for NASAL specimens only), is one component of a comprehensive MRSA colonization surveillance program. It is not intended to diagnose MRSA infection nor to guide or monitor treatment for MRSA infections.     RADIOLOGY:  No results found.   Management plans discussed with the patient, family and they are in agreement.  CODE STATUS:     Code Status Orders        Start     Ordered   12/25/16 0236  Full code  Continuous     12/25/16 0235    Code Status History    Date Active Date Inactive Code Status Order ID Comments User Context   11/21/2016 12:02 AM 11/23/2016  9:38 PM Full Code [id number]  Lewie Loron, NP ED   09/15/2016  3:07 AM 09/15/2016 10:43 AM Full Code [id number]  Alexis Wainwright, DO ED   08/21/2016 11:23 PM 08/25/2016  7:05 PM Full Code [id number]   Tonye Royalty, DO Inpatient      TOTAL TIME TAKING CARE OF THIS PATIENT: 40 minutes.    Mackinzie Vuncannon M.D on 12/27/2016 at 12:25 PM  Between 7am to 6pm - Pager - [phone] After 6pm go to [email or web address] - password [email or web address]  [place] [facility]  Office  [phone]  CC: Primary care physician; Barbette Reichmann, MD

## 2016-12-27 NOTE — Consult Note (Signed)
   [name] Consult   12/27/2016  [name] [name] Mar 27, 1934 [id number]   [name] [facility] with [facility] for chronic disease management services.  Patient has been engaged by [facility]CSW and [facility] facility liaison.  Our community based plan of care has focused on disease management and community resource support.  Patient will receive a post discharge transition of care visits [facility] for assistance with discharge planning needs.  Made Inpatient Case Manager aware that [facility] following. Of note, [facility] services does not replace or interfere with any services that are needed or arranged by inpatient case management or social work.  For additional questions or referrals please contact:  Axtyn Woehler RN, BSN [facility]  Hospital Liaison  [phone]) Business Mobile [phone]) Toll free office

## 2016-12-27 NOTE — Progress Notes (Signed)
CBG 165 per Thayer Ohmhris NT. Glucometer not synching yet.   Handoff from [place]. Patient resting quietly. No distress.

## 2016-12-27 NOTE — Clinical Social Work Note (Signed)
CSW is still waiting for insurance authorization for patient to return to SNF.  Ervin KnackEric R. Ziasia Lenoir, MSW, LCSWA [phone]  12/27/2016 1:20 PM

## 2016-12-27 NOTE — Progress Notes (Signed)
BCID note  Lab called [facility] G[facility] x1 aerobic bottle. No panel pathogens detected. Patient afebrile and WBC WNL. Per conversation with hospitalist will hold off starting antibiotics for now.

## 2016-12-28 ENCOUNTER — Other Ambulatory Visit: Payer: Self-pay | Admitting: *Deleted

## 2016-12-28 LAB — GLUCOSE, CAPILLARY
Glucose-Capillary: 109 mg/dL — ABNORMAL HIGH (ref 65–99)
Glucose-Capillary: 85 mg/dL (ref 65–99)
Glucose-Capillary: 96 mg/dL (ref 65–99)

## 2016-12-28 LAB — CREATININE, SERUM
Creatinine, Ser: 1.15 mg/dL — ABNORMAL HIGH (ref 0.44–1.00)
GFR calc Af Amer: 51 mL/min — ABNORMAL LOW
GFR calc non Af Amer: 44 mL/min — ABNORMAL LOW

## 2016-12-28 NOTE — Progress Notes (Signed)
BC reported to grow 1/2 micrococcus Will repeat to ensure this is not real. Pt is not septic

## 2016-12-28 NOTE — Clinical Social Work Note (Addendum)
CSW attempted to contact patient's daughter to discuss SNF placement, CSW left message awaiting for call back.  CSW also spoke to [employer] Corporationpatient's insurance company, who are still working on authorization for patient to go to SNF.  CSW awaiting call back from insurance company as well.  1:00pm  CSW spoke to [name] [phone] who said his daughter [name] [name], takes [name] of patient's finances, CSW asked patient's brother to have his daughter contact CSW as soon as possible to discuss discharge plan to a SNF.  CSW informed patient's brother that she is ready for discharge and CSW is trying to determine where patient will go.  CSW awaiting for call back.  2:15pm  CSW received a phone call from [name] [phone] in regards to patient going to [name].  [name] not able to return back to [name], due to facility not offering her a bed.  CSW updated patient's niece on other facility options and she chose [name].  CSW spoke to patient and she is in agreement to going to [name] for SNF placement once insurance company has given approval.  4:00pm  CSW spoke to [name] ext [phone] insurance company who approved patient to [name] Thursday reference number [id number].  CSW updated patient, patient's niece, physician, and bedside nurse.  CS[facility] contacted [name] who said they can accept patient on Thursday.  CSW explained to [name], that patient's niece would like assistance to begin Medicaid application process.  Admission worker at [facility] said she will notify [facility] worker at [name].  CSW to continue to follow patient's progress throughout discharge planning.  [name], MSW, Theresia MajorsLCSWA [phone]  12/28/2016 11:58 AM

## 2016-12-28 NOTE — Progress Notes (Signed)
[facility] [place] at [facility]   PATIENT NAME: [name] [name]    MR#:  [id number]  DATE OF BIRTH:  1934-08-10  SUBJECTIVE:  Doing well  REVIEW OF SYSTEMS:   Review of Systems  Constitutional: Negative for chills, fever and weight loss.  HENT: Negative for ear discharge, ear pain and nosebleeds.   Eyes: Negative for blurred vision, pain and discharge.  Respiratory: Negative for sputum production, shortness of breath, wheezing and stridor.   Cardiovascular: Negative for chest pain, palpitations, orthopnea and PND.  Gastrointestinal: Negative for abdominal pain, diarrhea, nausea and vomiting.  Genitourinary: Negative for frequency and urgency.  Musculoskeletal: Negative for back pain and joint pain.  Neurological: Positive for weakness. Negative for sensory change, speech change and focal weakness.  Psychiatric/Behavioral: Negative for depression and hallucinations. The patient is not nervous/anxious.    Tolerating Diet: Tolerating PT:   DRUG ALLERGIES:  No Known Allergies  VITALS:  Blood pressure (!) 115/55, pulse 76, temperature 98.3 F (36.8 C), temperature source Oral, resp. rate 16, height 5\' 3"  (1.6 m), weight 74.8 kg (164 lb 12.8 oz), SpO2 97 %.  PHYSICAL EXAMINATION:   Physical Exam  GENERAL:  83 y.o.-year-old patient lying in the bed with no acute distress.  EYES: Pupils equal, round, reactive to light and accommodation. No scleral icterus. Extraocular muscles intact.  HEENT: Head atraumatic, normocephalic. Oropharynx and nasopharynx clear.  NECK:  Supple, no jugular venous distention. No thyroid enlargement, no tenderness.  LUNGS: Normal breath sounds bilaterally, no wheezing, rales, rhonchi. No use of accessory muscles of respiration.  CARDIOVASCULAR: S1, S2 normal. No murmurs, rubs, or gallops.  ABDOMEN: Soft, nontender, nondistended. Bowel sounds present. No organomegaly or mass.  EXTREMITIES: No cyanosis, clubbing or edema b/l.     NEUROLOGIC: Cranial nerves II through XII are intact. No focal Motor or sensory deficits b/l.   PSYCHIATRIC:  patient is alert and oriented x 3.  SKIN: No obvious rash, lesion Right malleolus unstageable pressure injury 1 cm x 0.5 cm x 0.2 cm thin slough present to wound bed.  Pressure Ulcer POA: Yes Measurement: 3 cm x 3 cm intact dry eschar. With peeling epithelium noted to periwound.  Wound bed:100% dry eschar Drainage (amount, consistency, odor) No drainage. Musty odor.  PeriwoundIntact LABORATORY PANEL:  CBC  Recent Labs Lab 12/25/16 0440  WBC 8.9  HGB 9.6*  HCT 29.5*  PLT 338    Chemistries   Recent Labs Lab 12/24/16 2244 12/25/16 0440 12/28/16 0353  NA 135 136  --   K 4.6 4.7  --   CL 100* 100*  --   CO2 29 28  --   GLUCOSE 419* 438*  --   BUN 25* 24*  --   CREATININE 1.08* 1.02* 1.15*  CALCIUM 8.6* 8.4*  --   MG 2.2  --   --   AST  --  15  --   ALT  --  12*  --   ALKPHOS  --  89  --   BILITOT  --  0.8  --    Cardiac Enzymes  Recent Labs Lab 12/25/16 1659  TROPONINI 0.84*   RADIOLOGY:  No results found. ASSESSMENT AND PLAN:  83 y.o.femalewith a history of DM, HTN, CVA, diastolic CHF, HTN, peripheral neuropathy now being admitted with:  1. Acute hypoxic respiratory failure likely multifactorial secondary to CHF exacerbation and COPD history -off BIPAP - Diuresis, intake/output, daily weight. - Continue Coreg, Lasix, Lisinopril.  -change to oral lasix,  wean oxygen to RA - Elevated troponin, likely demand ischemia.  - Cardio consult appreciated -overnite pulse oximetry s/o hypoxia will get bedtime oxygen  2. COPD/asthmaExacerbation -mild  - Nebs prn,expectorants  3. CKD III-- at baseline -creat stable  4. DM - SSI, continue home regimen  5. HTN - continue Coreg, LIsinopril, Lasix  6. Hyperlipidemia - Continue Lipitor  7. Chronic pressure ulcers present on admission -follow wound care instructions  Overall stable for d/c  to rehab. Insurance company taking forever for approval!!!!   Case discussed with [facility] surgeonManagement/Social Worker. Management plans discussed with the patient, family and they are in agreement.  CODE STATUS:FULL  DVT Prophylaxis: lovenox TOTAL TIME TAKING CARE OF THIS PATIENT: 25 minutes.  >50% time spent on counselling and coordination of care  [name] ready for d/c  Note: This dictation was prepared with Dragon dictation along with smaller phrase technology. Any transcriptional errors that result from this process are unintentional.  Abagail Limb M.D on 12/28/2016 at 12:00 PM  Between 7am to 6pm - Pager - [phone]  After 6pm go to [email or web address] - password [email or web address] [facility]  [facility] [place]  Office  [phone]  CC: Primary care physician; Barbette ReichmannHANDE,VISHWANATH, MD

## 2016-12-28 NOTE — Care Management (Signed)
Informed that insurance may not approve for patient to return to skilled nursing. Spoke with niece [name] who is managing patient's finances for patient's daughter who is recovering from an injury. Patient may have to discharge home if insurance does not approve return to skilled nursing.  Patient has a wheelchair, walker and bedside commode in the home.  [facility]ency [facility] be [facility] and [facility].  Discussed the need for a hospital bed- this would be [facility] to [facility].  During the course of the conversation with [name]- Minerva Areolaric CSW received approval from patient's insurance for return to skilled nursing.

## 2016-12-28 NOTE — Patient Outreach (Signed)
[facility] Care Management  12/28/2016  [name] [name] May 20, 1934 [id number]   Phone call to patient's niece [name]. Per [name], [facility]. Per [name], patient has 11 more rehab days left. She agrees with LTC plan and will apply for  LTC Medicaid with the assistance of the social worker at the facility.  Plan:  This Child psychotherapistsocial worker will follow up with patient at [facility] and will collaborate with the discharge planner and patient's family regarding patient's long term care plan.   Adriana ReamsChrystal Lianni Kanaan, LCSW [facility] [phone]

## 2016-12-28 NOTE — Progress Notes (Signed)
Right malleolus cleansed with NS and pat gently dry.Applied Santyl to wound bed.Covered with NS moist gauze.Secured with 4x4 gauze and kerlix/tape, date and timed.

## 2016-12-28 NOTE — Care Management Important Message (Signed)
Important Message  Patient Details  Name: [name] [name] MRN: [id number] Date of Birth: [birth year]   Medicare Important Message Given:  Yes    Eber HongGreene, Ala Capri R, RN 12/28/2016, 4:00 PM

## 2016-12-29 DIAGNOSIS — I5033 Acute on chronic diastolic (congestive) heart failure: Secondary | ICD-10-CM | POA: Diagnosis not present

## 2016-12-29 DIAGNOSIS — R41841 Cognitive communication deficit: Secondary | ICD-10-CM | POA: Diagnosis not present

## 2016-12-29 DIAGNOSIS — J441 Chronic obstructive pulmonary disease with (acute) exacerbation: Secondary | ICD-10-CM | POA: Diagnosis not present

## 2016-12-29 DIAGNOSIS — I739 Peripheral vascular disease, unspecified: Secondary | ICD-10-CM | POA: Diagnosis not present

## 2016-12-29 DIAGNOSIS — E785 Hyperlipidemia, unspecified: Secondary | ICD-10-CM | POA: Diagnosis not present

## 2016-12-29 DIAGNOSIS — I509 Heart failure, unspecified: Secondary | ICD-10-CM | POA: Diagnosis not present

## 2016-12-29 DIAGNOSIS — T7840XD Allergy, unspecified, subsequent encounter: Secondary | ICD-10-CM | POA: Diagnosis not present

## 2016-12-29 DIAGNOSIS — J449 Chronic obstructive pulmonary disease, unspecified: Secondary | ICD-10-CM | POA: Diagnosis not present

## 2016-12-29 DIAGNOSIS — I10 Essential (primary) hypertension: Secondary | ICD-10-CM | POA: Diagnosis not present

## 2016-12-29 DIAGNOSIS — G9009 Other idiopathic peripheral autonomic neuropathy: Secondary | ICD-10-CM | POA: Diagnosis not present

## 2016-12-29 DIAGNOSIS — I6990 Unspecified sequelae of unspecified cerebrovascular disease: Secondary | ICD-10-CM | POA: Diagnosis not present

## 2016-12-29 DIAGNOSIS — J9601 Acute respiratory failure with hypoxia: Secondary | ICD-10-CM | POA: Diagnosis not present

## 2016-12-29 DIAGNOSIS — N183 Chronic kidney disease, stage 3 (moderate): Secondary | ICD-10-CM | POA: Diagnosis not present

## 2016-12-29 DIAGNOSIS — E119 Type 2 diabetes mellitus without complications: Secondary | ICD-10-CM | POA: Diagnosis not present

## 2016-12-29 DIAGNOSIS — R262 Difficulty in walking, not elsewhere classified: Secondary | ICD-10-CM | POA: Diagnosis not present

## 2016-12-29 DIAGNOSIS — I110 Hypertensive heart disease with heart failure: Secondary | ICD-10-CM | POA: Diagnosis not present

## 2016-12-29 DIAGNOSIS — R0600 Dyspnea, unspecified: Secondary | ICD-10-CM | POA: Diagnosis not present

## 2016-12-29 DIAGNOSIS — G629 Polyneuropathy, unspecified: Secondary | ICD-10-CM | POA: Diagnosis not present

## 2016-12-29 DIAGNOSIS — Z7401 Bed confinement status: Secondary | ICD-10-CM | POA: Diagnosis not present

## 2016-12-29 DIAGNOSIS — Z741 Need for assistance with personal care: Secondary | ICD-10-CM | POA: Diagnosis not present

## 2016-12-29 LAB — GLUCOSE, CAPILLARY
GLUCOSE-CAPILLARY: 139 mg/dL — AB (ref 65–99)
GLUCOSE-CAPILLARY: 29 mg/dL — AB (ref 65–99)
GLUCOSE-CAPILLARY: 77 mg/dL (ref 65–99)
GLUCOSE-CAPILLARY: 184 mg/dL — AB (ref 65–99)
GLUCOSE-CAPILLARY: 153 mg/dL — AB (ref 65–99)
Glucose-Capillary: 165 mg/dL — ABNORMAL HIGH (ref 65–99)
Glucose-Capillary: 178 mg/dL — ABNORMAL HIGH (ref 65–99)
Glucose-Capillary: 143 mg/dL — ABNORMAL HIGH (ref 65–99)
Glucose-Capillary: 60 mg/dL — ABNORMAL LOW (ref 65–99)

## 2016-12-29 LAB — CULTURE, BLOOD (ROUTINE X 2)

## 2016-12-29 MED ORDER — INSULIN GLARGINE 100 UNIT/ML ~~LOC~~ SOLN
10.0000 [IU] | Freq: Every day | SUBCUTANEOUS | Status: DC
  Filled 2016-12-29: qty 0.1

## 2016-12-29 MED ORDER — INSULIN GLARGINE 100 UNIT/ML ~~LOC~~ SOLN: 10.0000 [IU] | Freq: Every day | SUBCUTANEOUS | 3 refills | Status: DC

## 2016-12-29 NOTE — Progress Notes (Signed)
BS up to 77, pt eating breakfast, will continue to monitor.

## 2016-12-29 NOTE — Progress Notes (Signed)
Report called to Janeann MerlBarbara Graves, [facility].

## 2016-12-29 NOTE — Progress Notes (Signed)
EMS called for non emergent transport to [facility]

## 2016-12-29 NOTE — Clinical Social Work Note (Addendum)
[facility] to [facility].   Patient and family agreeable to plans will transport via ems RN to call report to [phone] CSW updated patient's niece Yetta Glassmanabby Garner that patient will be discharging today.   Windell MouldingEric Kristjan Derner, MSW, Theresia MajorsLCSWA [phone]

## 2016-12-29 NOTE — Progress Notes (Signed)
FSBS 29, pt alert, orientd to person/place.  Orange juice with cracker and peanut butter given, will recheck BS

## 2016-12-29 NOTE — Plan of Care (Signed)
Problem: Safety: Goal: Ability to remain free from injury will improve Outcome: Progressing Fall precautions in place  Problem: Tissue Perfusion: Goal: Risk factors for ineffective tissue perfusion will decrease Outcome: Progressing SQ Lovenox     

## 2016-12-29 NOTE — Discharge Summary (Signed)
[facility] [place] at [facility]   PATIENT NAME: [name] [name]    MR#:  [id number]  DATE OF BIRTH:  1934-03-02  DATE OF ADMISSION:  12/24/2016 ADMITTING PHYSICIAN: Tonye Royalty, DO  DATE OF DISCHARGE: 12/29/16  PRIMARY CARE PHYSICIAN: Barbette Reichmann, MD    ADMISSION DIAGNOSIS:  COPD exacerbation (HCC) [J44.1] Acute on chronic congestive heart failure, unspecified congestive heart failure type (HCC) [I50.9]  DISCHARGE DIAGNOSIS:  Acute on chronic respiratory failure due to CHF diastolic COPD stable DM-2 Hypoxia -now on oxygen at bedtime Chronic ulcers present on admission SECONDARY DIAGNOSIS:   Past Medical History:  Diagnosis Date  . Diabetes mellitus without complication (HCC)   . Hypertension   . Stroke [facility])     HOSPITAL COURSE:  83 y.o.femalewith a history of DM, HTN, CVA, diastolic CHF, HTN, peripheral neuropathy now being admitted with:  1. Acute hypoxic respiratory failure likely multifactorial secondary to CHF exacerbation and COPD history -off BIPAP - Diuresis, intake/output, daily weight. - Continue Coreg, Lasix, Lisinopril.  -change to oral lasix, wean oxygen to RA - Elevated troponin, likely demand ischemia.  - Cardio consult appreciated -overnite pulse oximetry s/o hypoxia will get bedtime oxygen  2. COPD/asthma Exacerbation -mild  - Nebs prn, expectorants  3. CKD III-- at baseline -creat stable  4. DM - SSI, continue home regimen -lantus decreased to 10 units qd (before breakfast)-sugars 29 this am  But now stable after oral intake to 79  5. HTN - continue Coreg, LIsinopril, Lasix  6. Hyperlipidemia - Continue Lipitor  7. Chronic pressure ulcers present on admission -follow wound care instrcutions Overall stable for d/c to rehab CONSULTS OBTAINED:  Treatment Team:  Marcina Millard, MD Yevonne Pax, MD Mertie Moores, MD  DRUG ALLERGIES:  No Known Allergies  DISCHARGE MEDICATIONS:    Current Discharge Medication List    CONTINUE these medications which have CHANGED   Details  insulin glargine (LANTUS) 100 UNIT/ML injection Inject 0.1 mLs (10 Units total) into the skin daily. Qty: 10 mL, Refills: 3      CONTINUE these medications which have NOT CHANGED   Details  acetaminophen (TYLENOL) 325 MG tablet Take 2 tablets (650 mg total) by mouth every 6 (six) hours as needed for mild pain (temp > 101.5).    albuterol (PROVENTIL HFA;VENTOLIN HFA) 108 (90 Base) MCG/ACT inhaler Inhale 2 puffs into the lungs every 6 (six) hours as needed for wheezing or shortness of breath. Qty: 1 Inhaler, Refills: 1    aspirin EC 81 MG tablet Take 1 tablet by mouth 1 day or 1 dose.    atorvastatin (LIPITOR) 10 MG tablet Take 1 tablet by mouth at bedtime.     carvedilol (COREG) 3.125 MG tablet Take 1 tablet (3.125 mg total) by mouth 2 (two) times daily with a meal. Qty: 30 tablet, Refills: 0    clopidogrel (PLAVIX) 75 MG tablet Take 1 tablet (75 mg total) by mouth daily. Qty: 30 tablet, Refills: 1    collagenase (SANTYL) ointment Apply topically daily. Qty: 15 g, Refills: 0    diphenhydrAMINE (BENADRYL) 25 mg capsule Take 25 mg by mouth every 6 (six) hours as needed for itching.    fluticasone-salmeterol (ADVAIR HFA) 115-21 MCG/ACT inhaler Inhale 2 puffs into the lungs 2 (two) times daily. Qty: 1 Inhaler, Refills: 0    furosemide (LASIX) 20 MG tablet Take 1 tablet (20 mg total) by mouth 2 (two) times daily. Qty: 60 tablet, Refills: 0    gabapentin (NEURONTIN)  100 MG capsule Take 2 capsules by mouth 2 (two) times daily. Take 2 capsules (200mg ) twice a day    insulin aspart (NOVOLOG) 100 UNIT/ML injection Inject 0-15 Units into the skin 3 (three) times daily with meals. Qty: 100 mL, Refills: 0    lisinopril (PRINIVIL,ZESTRIL) 2.5 MG tablet Take 1 tablet (2.5 mg total) by mouth daily. Qty: 30 tablet, Refills: 0    loratadine (CLARITIN) 10 MG tablet Take 10 mg by mouth daily.     potassium chloride SA (K-DUR,KLOR-CON) 20 MEQ tablet Take 20 mEq by mouth daily.    Vitamin D, Ergocalciferol, (DRISDOL) 50000 units CAPS capsule Take 1 capsule by mouth once a week.      STOP taking these medications     predniSONE (STERAPRED UNI-PAK 21 TAB) 10 MG (21) TBPK tablet         If you experience worsening of your admission symptoms, develop shortness of breath, life threatening emergency, suicidal or homicidal thoughts you must seek medical attention immediately by calling 911 or calling your MD immediately  if symptoms less severe.  You Must read complete instructions/literature along with all the possible adverse reactions/side effects for all the Medicines you take and that have been prescribed to you. Take any new Medicines after you have completely understood and accept all the possible adverse reactions/side effects.   Please note  You were cared for by a hospitalist during your hospital stay. If you have any questions about your discharge medications or the care you received while you were in the hospital after you are discharged, you can call the unit and asked to speak with the hospitalist on call if the hospitalist that took care of you is not available. Once you are discharged, your primary care physician will handle any further medical issues. Please note that NO REFILLS for any discharge medications will be authorized once you are discharged, as it is imperative that you return to your primary care physician (or establish a relationship with a primary care physician if you do not have one) for your aftercare needs so that they can reassess your need for medications and monitor your lab values. Today   SUBJECTIVE   Doing well Low sugars this am but now better  VITAL SIGNS:  Blood pressure (!) 154/54, pulse 72, temperature 98.1 F (36.7 C), temperature source Oral, resp. rate 17, height 5\' 3"  (1.6 m), weight 74.8 kg (164 lb 12.8 oz), SpO2 98 %.  I/O:     Intake/Output Summary (Last 24 hours) at 12/29/16 1050 Last data filed at 12/29/16 0900  Gross per 24 hour  Intake              600 ml  Output                0 ml  Net              600 ml    PHYSICAL EXAMINATION:  GENERAL:  83 y.o.-year-old patient lying in the bed with no acute distress.  EYES: Pupils equal, round, reactive to light and accommodation. No scleral icterus. Extraocular muscles intact.  HEENT: Head atraumatic, normocephalic. Oropharynx and nasopharynx clear.  NECK:  Supple, no jugular venous distention. No thyroid enlargement, no tenderness.  LUNGS: Normal breath sounds bilaterally, no wheezing, rales,rhonchi or crepitation. No use of accessory muscles of respiration.  CARDIOVASCULAR: S1, S2 normal. No murmurs, rubs, or gallops.  ABDOMEN: Soft, non-tender, non-distended. Bowel sounds present. No organomegaly or mass.  EXTREMITIES: No pedal  edema, cyanosis, or clubbing. Chronic leg ulcer NEUROLOGIC: Cranial nerves II through XII are intact. Muscle strength 5/5 in all extremities. Sensation intact. Gait not checked.  PSYCHIATRIC: The patient is alert and oriented x 3.  SKIN: No obvious rash, lesion, or ulcer.  Right malleolus unstageable pressure injury 1 cm x 0.5 cm x 0.2 cm thin slough present to wound bed.  Pressure Ulcer POA: Yes Measurement: 3 cm x 3 cm intact dry eschar. With peeling epithelium noted to periwound.  Wound bed:100% dry eschar Drainage (amount, consistency, odor) No drainage. Musty odor.  PeriwoundIntact  DATA REVIEW:   CBC   Recent Labs Lab 12/25/16 0440  WBC 8.9  HGB 9.6*  HCT 29.5*  PLT 338    Chemistries   Recent Labs Lab 12/24/16 2244 12/25/16 0440 12/28/16 0353  NA 135 136  --   K 4.6 4.7  --   CL 100* 100*  --   CO2 29 28  --   GLUCOSE 419* 438*  --   BUN 25* 24*  --   CREATININE 1.08* 1.02* 1.15*  CALCIUM 8.6* 8.4*  --   MG 2.2  --   --   AST  --  15  --   ALT  --  12*  --   ALKPHOS  --  89  --   BILITOT  --  0.8   --     Microbiology Results   Recent Results (from the past 240 hour(s))  Culture, blood (Routine X 2) w Reflex to ID Panel     Status: Abnormal   Collection Time: 12/24/16 10:44 PM  Result Value Ref Range Status   Specimen Description BLOOD LEFT ARM  Final   Special Requests BOTTLES DRAWN AEROBIC AND ANAEROBIC [id number],3CCANA  Final   Culture  Setup Time   Final    GRAM POSITIVE COCCI CRITICAL RESULT CALLED TO, READ BACK BY AND VERIFIED WITH: Matt McBane @ 2300 12/26/16 by [facility] AEROBIC BOTTLE ONLY    Culture (A)  Final    MICROCOCCUS LUTEUS/LYLAE Standardized susceptibility testing for this organism is not available. Performed at [facility]    Report Status 12/29/2016 FINAL  Final  Culture, blood (Routine X 2) w Reflex to ID Panel     Status: None (Preliminary result)   Collection Time: 12/24/16 10:44 PM  Result Value Ref Range Status   Specimen Description BLOOD RIGHT ARM  Final   Special Requests BOTTLES DRAWN AEROBIC AND ANAEROBIC 5CCAERO,3CCANA  Final   Culture NO GROWTH 4 DAYS  Final   Report Status PENDING  Incomplete  MRSA PCR Screening     Status: None   Collection Time: 12/25/16  2:37 AM  Result Value Ref Range Status   MRSA by PCR NEGATIVE NEGATIVE Final    Comment:        The GeneXpert MRSA Assay (FDA approved for NASAL specimens only), is one component of a comprehensive MRSA colonization surveillance program. It is not intended to diagnose MRSA infection nor to guide or monitor treatment for MRSA infections.   Culture, blood (single) w Reflex to ID Panel     Status: None (Preliminary result)   Collection Time: 12/28/16  2:31 PM  Result Value Ref Range Status   Specimen Description BLOOD RIGHT FATTY CASTS  Final   Special Requests BOTTLES DRAWN AEROBIC AND ANAEROBIC 1MLAERO,1MLANA  Final   Culture NO GROWTH < 24 HOURS  Final   Report Status PENDING  Incomplete    RADIOLOGY:  No  results found.   Management plans discussed with the patient,  family and they are in agreement.  CODE STATUS:     Code Status Orders        Start     Ordered   12/25/16 0236  Full code  Continuous     12/25/16 0235    Code Status History    Date Active Date Inactive Code Status Order ID Comments User Context   11/21/2016 12:02 AM 11/23/2016  9:38 PM Full Code [id number]  Lewie Loron, NP ED   09/15/2016  3:07 AM 09/15/2016 10:43 AM Full Code [id number]  Tonye Royalty, DO ED   08/21/2016 11:23 PM 08/25/2016  7:05 PM Full Code [id number]  Tonye Royalty, DO Inpatient      TOTAL TIME TAKING CARE OF THIS PATIENT: 40 minutes.    Bernell Sigal M.D on 12/29/2016 at 10:50 AM  Between 7am to 6pm - Pager - [phone] After 6pm go to [email or web address] - password [email or web address] [facility]  [place] Hospital[facility]  Office  [phone]  CC: Primary care physician; Barbette Reichmann, MD

## 2016-12-29 NOTE — Progress Notes (Signed)
[facility]

## 2016-12-30 LAB — CULTURE, BLOOD (ROUTINE X 2): CULTURE: NO GROWTH

## 2016-12-30 LAB — BLOOD CULTURE ID PANEL (REFLEXED)
Acinetobacter baumannii: NOT DETECTED
CANDIDA KRUSEI: NOT DETECTED
CANDIDA PARAPSILOSIS: NOT DETECTED
CANDIDA TROPICALIS: NOT DETECTED
Candida albicans: NOT DETECTED
Candida glabrata: NOT DETECTED
ENTEROCOCCUS SPECIES: NOT DETECTED
ESCHERICHIA COLI: NOT DETECTED
Enterobacter cloacae complex: NOT DETECTED
Enterobacteriaceae species: NOT DETECTED
HAEMOPHILUS INFLUENZAE: NOT DETECTED
KLEBSIELLA OXYTOCA: NOT DETECTED
Klebsiella pneumoniae: NOT DETECTED
LISTERIA MONOCYTOGENES: NOT DETECTED
Neisseria meningitidis: NOT DETECTED
PROTEUS SPECIES: NOT DETECTED
Pseudomonas aeruginosa: NOT DETECTED
SERRATIA MARCESCENS: NOT DETECTED
STAPHYLOCOCCUS AUREUS BCID: NOT DETECTED
STAPHYLOCOCCUS SPECIES: NOT DETECTED
STREPTOCOCCUS PYOGENES: NOT DETECTED
STREPTOCOCCUS SPECIES: NOT DETECTED
Streptococcus agalactiae: NOT DETECTED
Streptococcus pneumoniae: NOT DETECTED

## 2016-12-31 DIAGNOSIS — J441 Chronic obstructive pulmonary disease with (acute) exacerbation: Secondary | ICD-10-CM | POA: Diagnosis not present

## 2016-12-31 DIAGNOSIS — E119 Type 2 diabetes mellitus without complications: Secondary | ICD-10-CM | POA: Diagnosis not present

## 2016-12-31 DIAGNOSIS — I10 Essential (primary) hypertension: Secondary | ICD-10-CM | POA: Diagnosis not present

## 2017-01-02 LAB — CULTURE, BLOOD (SINGLE): Culture: NO GROWTH

## 2017-01-03 ENCOUNTER — Other Ambulatory Visit: Payer: Self-pay | Admitting: *Deleted

## 2017-01-03 DIAGNOSIS — J449 Chronic obstructive pulmonary disease, unspecified: Secondary | ICD-10-CM | POA: Diagnosis not present

## 2017-01-03 DIAGNOSIS — I509 Heart failure, unspecified: Secondary | ICD-10-CM | POA: Diagnosis not present

## 2017-01-03 DIAGNOSIS — E119 Type 2 diabetes mellitus without complications: Secondary | ICD-10-CM | POA: Diagnosis not present

## 2017-01-03 DIAGNOSIS — E785 Hyperlipidemia, unspecified: Secondary | ICD-10-CM | POA: Diagnosis not present

## 2017-01-03 DIAGNOSIS — T7840XD Allergy, unspecified, subsequent encounter: Secondary | ICD-10-CM | POA: Diagnosis not present

## 2017-01-03 DIAGNOSIS — I6990 Unspecified sequelae of unspecified cerebrovascular disease: Secondary | ICD-10-CM | POA: Diagnosis not present

## 2017-01-03 DIAGNOSIS — I110 Hypertensive heart disease with heart failure: Secondary | ICD-10-CM | POA: Diagnosis not present

## 2017-01-03 DIAGNOSIS — G629 Polyneuropathy, unspecified: Secondary | ICD-10-CM | POA: Diagnosis not present

## 2017-01-03 NOTE — Patient Outreach (Addendum)
[facility](THN) Care Management  [facility] Social [facility]  01/03/2017  [name] [name] 1934-11-28 [id number]  Subjective:  [name] a 83 year old female currently in rehab at [facility].  Patient's sister [name] [name] [phone] at bedside during the visit.  Patient gave this social worker verbal persmission to speak to her sister on an ongoing basis. Per patient she would like to return home, however patient's sister discussed concern about the amount of care patient will receive if she returns home.  Per patient's sister, patient's daughter resides in the home, however has suffered a broken leg and is unable to provide any hands on care. Patient has several family members that would be able to help with care but may not be able to provide the 24 hour commitment to care suggested.  Patient's sister was called in by [name] to assist with completing the medicaid application to utilize if patient remains in long term care or chooses an assisted living. Patient adamant about returning home and feels confident that her family will pull together to provide care.  Objective:   Encounter Medications:  Outpatient Encounter Prescriptions as of 01/03/2017  Medication Sig  . acetaminophen (TYLENOL) 325 MG tablet Take 2 tablets (650 mg total) by mouth every 6 (six) hours as needed for mild pain (temp > 101.5).  Marland Kitchen. albuterol (PROVENTIL HFA;VENTOLIN HFA) 108 (90 Base) MCG/ACT inhaler Inhale 2 puffs into the lungs every 6 (six) hours as needed for wheezing or shortness of breath.  Marland Kitchen. aspirin EC 81 MG tablet Take 1 tablet by mouth 1 day or 1 dose.  Marland Kitchen. atorvastatin (LIPITOR) 10 MG tablet Take 1 tablet by mouth at bedtime.   . carvedilol (COREG) 3.125 MG tablet Take 1 tablet (3.125 mg total) by mouth 2 (two) times daily with a meal.  . clopidogrel (PLAVIX) 75 MG tablet Take 1 tablet (75 mg total) by mouth daily.  . collagenase (SANTYL) ointment Apply topically daily.  .  diphenhydrAMINE (BENADRYL) 25 mg capsule Take 25 mg by mouth every 6 (six) hours as needed for itching.  . fluticasone-salmeterol (ADVAIR HFA) 115-21 MCG/ACT inhaler Inhale 2 puffs into the lungs 2 (two) times daily.  . furosemide (LASIX) 20 MG tablet Take 1 tablet (20 mg total) by mouth 2 (two) times daily.  Marland Kitchen. gabapentin (NEURONTIN) 100 MG capsule Take 2 capsules by mouth 2 (two) times daily. Take 2 capsules (200mg ) twice a day  . insulin aspart (NOVOLOG) 100 UNIT/ML injection Inject 0-15 Units into the skin 3 (three) times daily with meals.  . insulin glargine (LANTUS) 100 UNIT/ML injection Inject 0.1 mLs (10 Units total) into the skin daily.  Marland Kitchen. lisinopril (PRINIVIL,ZESTRIL) 2.5 MG tablet Take 1 tablet (2.5 mg total) by mouth daily.  Marland Kitchen. loratadine (CLARITIN) 10 MG tablet Take 10 mg by mouth daily.  . potassium chloride SA (K-DUR,KLOR-CON) 20 MEQ tablet Take 20 mEq by mouth daily.  . Vitamin D, Ergocalciferol, (DRISDOL) 50000 units CAPS capsule Take 1 capsule by mouth once a week.   No facility-administered encounter medications on file as of 01/03/2017.     Functional Status:  In your present state of health, do you have any difficulty performing the following activities: 12/29/2016 11/30/2016  Hearing? N N  Vision? N Y  Difficulty concentrating or making decisions? Malvin JohnsY Y  Walking or climbing stairs? Y Y  Dressing or bathing? Y Y  Doing errands, shopping? N Y  Quarry managerreparing Food and eating ? - Y  Using the Toilet? -  Y  In the past six months, have you accidently leaked urine? - Y  Do you have problems with loss of bowel control? - Y  Managing your Medications? - Y  Managing your Finances? - Y  Housekeeping or managing your Housekeeping? - Y  Some recent data might be hidden    Fall/Depression Screening:  PHQ 2/9 Scores 11/30/2016  PHQ - 2 Score 0    Assessment: This social worker spoke with discharge planner [name] [name] who confirmed that patient is now in her co-pay days. She will soon  transition into self-pay. [name] is currently working on obtaining the documents needed to complete the medicaid application. The discharge planner will assist with completing the application for long term care.  Initial care planning meeting took place today. Preliminary discharge plans discussed.  Patient would like to go home, however patient's family concerned about the amount of care she will received. No decisions made to date.   The discharge planner will contact this social worker when the next care planning meeting is scheduled. Patient's competency discussed, patient remains able to make her own decisions at this time. Patient continues to work with rehab and is receiving wound care.  Plan: This social worker will continue to assist patient and family with discharge planning. This Child psychotherapist will follow up within 2 weeks.   [name] [facility] Care Management [phone]

## 2017-01-10 ENCOUNTER — Ambulatory Visit: Payer: Self-pay | Admitting: *Deleted

## 2017-01-10 ENCOUNTER — Ambulatory Visit: Payer: Medicare HMO | Admitting: *Deleted

## 2017-01-10 ENCOUNTER — Other Ambulatory Visit: Payer: Self-pay | Admitting: *Deleted

## 2017-01-10 NOTE — Patient Outreach (Signed)
[facility](THN) Care Management  01/10/2017  [name] [name] 01-Mar-1934 [id number]   Phone [facility] for update on patient's long term plan. Voicemail message left requesting a return call.    Adriana ReamsChrystal Land, LCSW [facility] Care Management [phone]

## 2017-01-11 ENCOUNTER — Other Ambulatory Visit: Payer: Self-pay | Admitting: *Deleted

## 2017-01-11 ENCOUNTER — Other Ambulatory Visit: Payer: Medicare HMO | Admitting: *Deleted

## 2017-01-11 NOTE — Patient Outreach (Signed)
[facility]) Care Management  [facility] Social Work  01/11/2017  [name] [name] 06/04/1934 [id number]  Subjective:  Met with patient at bed side at [facility]. Patient states that she will be discharging on 01/12/17 back home. Patient states that she is ready to return home, however reports still not really being able to stand on her feet. Per patient, she still receives daily wound care. Patient discussed not being sure who exactly will be providing in home care, however reports that her sister [name] has been working on hiring a family member to provide care during the day.  Objective:   Encounter Medications:  Outpatient Encounter Prescriptions as of 01/11/2017  Medication Sig  . acetaminophen (TYLENOL) 325 MG tablet Take 2 tablets (650 mg total) by mouth every 6 (six) hours as needed for mild pain (temp > 101.5).  Marland Kitchen albuterol (PROVENTIL HFA;VENTOLIN HFA) 108 (90 Base) MCG/ACT inhaler Inhale 2 puffs into the lungs every 6 (six) hours as needed for wheezing or shortness of breath.  Marland Kitchen aspirin EC 81 MG tablet Take 1 tablet by mouth 1 day or 1 dose.  Marland Kitchen atorvastatin (LIPITOR) 10 MG tablet Take 1 tablet by mouth at bedtime.   . carvedilol (COREG) 3.125 MG tablet Take 1 tablet (3.125 mg total) by mouth 2 (two) times daily with a meal.  . clopidogrel (PLAVIX) 75 MG tablet Take 1 tablet (75 mg total) by mouth daily.  . collagenase (SANTYL) ointment Apply topically daily.  . diphenhydrAMINE (BENADRYL) 25 mg capsule Take 25 mg by mouth every 6 (six) hours as needed for itching.  . fluticasone-salmeterol (ADVAIR HFA) 115-21 MCG/ACT inhaler Inhale 2 puffs into the lungs 2 (two) times daily.  . furosemide (LASIX) 20 MG tablet Take 1 tablet (20 mg total) by mouth 2 (two) times daily.  Marland Kitchen gabapentin (NEURONTIN) 100 MG capsule Take 2 capsules by mouth 2 (two) times daily. Take 2 capsules ('200mg'$ ) twice a day  . insulin aspart (NOVOLOG) 100 UNIT/ML injection Inject 0-15 Units into the  skin 3 (three) times daily with meals.  . insulin glargine (LANTUS) 100 UNIT/ML injection Inject 0.1 mLs (10 Units total) into the skin daily.  Marland Kitchen lisinopril (PRINIVIL,ZESTRIL) 2.5 MG tablet Take 1 tablet (2.5 mg total) by mouth daily.  Marland Kitchen loratadine (CLARITIN) 10 MG tablet Take 10 mg by mouth daily.  . potassium chloride SA (K-DUR,KLOR-CON) 20 MEQ tablet Take 20 mEq by mouth daily.  . Vitamin D, Ergocalciferol, (DRISDOL) 50000 units CAPS capsule Take 1 capsule by mouth once a week.   No facility-administered encounter medications on file as of 01/11/2017.     Functional Status:  In your present state of health, do you have any difficulty performing the following activities: 12/29/2016 11/30/2016  Hearing? N N  Vision? N Y  Difficulty concentrating or making decisions? [name]  Walking or climbing stairs? Y Y  Dressing or bathing? Y Y  Doing errands, shopping? N Y  Conservation officer, nature and eating ? - Y  Using the Toilet? - Y  In the past six months, have you accidently leaked urine? - Y  Do you have problems with loss of bowel control? - Y  Managing your Medications? - Y  Managing your Finances? - Y  Housekeeping or managing your Housekeeping? - Y  Some recent data might be hidden    Fall/Depression Screening:  PHQ 2/9 Scores 11/30/2016  PHQ - 2 Score 0    Assessment:  This Education officer, museum confirmed with discharge planner [place]  that patient will discharge home on 01/12/17 with [place] through [facility]. Patient will have wound care.  Her family has purchased a hospital bed, no other DME needs at this time.  Plan:  RNCM to be notified of patient's discharge for transition of care. This Education officer, museum will follow up with patient as well in her home to assess for additional care needs.   Sheralyn Boatman [facility] Care Management [phone]

## 2017-01-11 NOTE — Patient Outreach (Signed)
[facility](THN) Care Management  01/11/2017  Frederich Chickrnell Weedon 1934-05-26 [id number]   Phone call to patient's sister [name] to check patient status in skilled care.  Per patient's sister, patient does not qualify for Medicaid and will need to discharge home.  She will discharge home on Thursday or Friday. Per patient's sister, they have purchased a hospital bed for her, she will discharge with HH. Per patient's sister, they are looking into hiring a family member to provide personal care services for patient to address her care needs post discharge.  Plan: This social worker will follow up with discharge planner [place] regarding patient's discharge needs. This social worker will inform RNCM for transition of care.    Adriana ReamsChrystal Land, LCSW [facility] Care Management [phone]

## 2017-01-12 ENCOUNTER — Encounter (INDEPENDENT_AMBULATORY_CARE_PROVIDER_SITE_OTHER): Payer: Commercial Managed Care - HMO

## 2017-01-12 ENCOUNTER — Ambulatory Visit (INDEPENDENT_AMBULATORY_CARE_PROVIDER_SITE_OTHER): Payer: Commercial Managed Care - HMO | Admitting: Vascular Surgery

## 2017-01-12 DIAGNOSIS — J449 Chronic obstructive pulmonary disease, unspecified: Secondary | ICD-10-CM | POA: Diagnosis not present

## 2017-01-12 DIAGNOSIS — L89613 Pressure ulcer of right heel, stage 3: Secondary | ICD-10-CM | POA: Diagnosis not present

## 2017-01-12 DIAGNOSIS — L89510 Pressure ulcer of right ankle, unstageable: Secondary | ICD-10-CM | POA: Diagnosis not present

## 2017-01-12 DIAGNOSIS — E1122 Type 2 diabetes mellitus with diabetic chronic kidney disease: Secondary | ICD-10-CM | POA: Diagnosis not present

## 2017-01-12 DIAGNOSIS — N189 Chronic kidney disease, unspecified: Secondary | ICD-10-CM | POA: Diagnosis not present

## 2017-01-12 DIAGNOSIS — L89620 Pressure ulcer of left heel, unstageable: Secondary | ICD-10-CM | POA: Diagnosis not present

## 2017-01-12 DIAGNOSIS — I130 Hypertensive heart and chronic kidney disease with heart failure and stage 1 through stage 4 chronic kidney disease, or unspecified chronic kidney disease: Secondary | ICD-10-CM | POA: Diagnosis not present

## 2017-01-12 DIAGNOSIS — L89890 Pressure ulcer of other site, unstageable: Secondary | ICD-10-CM | POA: Diagnosis not present

## 2017-01-12 DIAGNOSIS — I5032 Chronic diastolic (congestive) heart failure: Secondary | ICD-10-CM | POA: Diagnosis not present

## 2017-01-13 ENCOUNTER — Ambulatory Visit: Payer: Self-pay | Admitting: *Deleted

## 2017-01-13 ENCOUNTER — Encounter: Payer: Self-pay | Admitting: *Deleted

## 2017-01-13 ENCOUNTER — Other Ambulatory Visit: Payer: Self-pay | Admitting: *Deleted

## 2017-01-13 NOTE — Patient Outreach (Signed)
[facility](THN) Care Management  01/13/2017  [name] [name] 1934-01-13 [id number]  Transition of care call   Placed call to [name] Matinabatha [name], Niece and [facility] consent contact, for transition of care call, hipaa information verified, [name] [name] states patient would not be able answer questions .  [name] [name]  discussed that patient was discharged to home on 1/25, she has a hospital bed. Wyatt Mageabitha states [facility] and visit has been planned. Mrs.[name] [name] is staying at home with her daughter that has a broken leg that is unable to assist patient at home and family is still working on arranging increased support in the home.   [name] [name] plans to visit patient after she is off work on today and make sure she has all of her prescriptions needed and other supplies.  Unable to complete transition of care flow sheet at this telephone visit. Plan Will follow up with patient /caregiver on next business day and arrange a sooner home visit . Will send MD barrier letter .    Egbert GaribaldiKimberly Glover, RN, [facility] Care Management [phone]- Mobile [phone]- Toll Free Main Office

## 2017-01-16 ENCOUNTER — Other Ambulatory Visit: Payer: Self-pay | Admitting: *Deleted

## 2017-01-16 DIAGNOSIS — L89510 Pressure ulcer of right ankle, unstageable: Secondary | ICD-10-CM | POA: Diagnosis not present

## 2017-01-16 DIAGNOSIS — J449 Chronic obstructive pulmonary disease, unspecified: Secondary | ICD-10-CM | POA: Diagnosis not present

## 2017-01-16 DIAGNOSIS — I130 Hypertensive heart and chronic kidney disease with heart failure and stage 1 through stage 4 chronic kidney disease, or unspecified chronic kidney disease: Secondary | ICD-10-CM | POA: Diagnosis not present

## 2017-01-16 DIAGNOSIS — E1122 Type 2 diabetes mellitus with diabetic chronic kidney disease: Secondary | ICD-10-CM | POA: Diagnosis not present

## 2017-01-16 DIAGNOSIS — L89890 Pressure ulcer of other site, unstageable: Secondary | ICD-10-CM | POA: Diagnosis not present

## 2017-01-16 DIAGNOSIS — I5032 Chronic diastolic (congestive) heart failure: Secondary | ICD-10-CM | POA: Diagnosis not present

## 2017-01-16 DIAGNOSIS — N189 Chronic kidney disease, unspecified: Secondary | ICD-10-CM | POA: Diagnosis not present

## 2017-01-16 DIAGNOSIS — L89620 Pressure ulcer of left heel, unstageable: Secondary | ICD-10-CM | POA: Diagnosis not present

## 2017-01-16 DIAGNOSIS — L89613 Pressure ulcer of right heel, stage 3: Secondary | ICD-10-CM | POA: Diagnosis not present

## 2017-01-16 NOTE — Patient Outreach (Signed)
[facility](THN) Care Management  01/16/2017  [name] [name] 02-09-1934 [id number]   Transition of care   Received call from [facility], LCSW, [name] [name] sister [name] FieldOctive [name] spoke with social worker regarding call needs. Placed call to [name] Modenactive [name],hipaa information verified. She voiced concerns regarding patient care needs.  She discussed that prescriptions where filled on Saturday total prescriptions of 14 she is unsure if new insulin was one of the, but she is not at [name] [name] home today. [name] [name].[name] states patient's sister in law is giving insulin daily, but they have not checked blood sugar because they are unsure about using meter.   Care Coordination calls to   [facility]- verified insulin was in the group of medication picked at pharmacy,   Placed call to [name] [name], verified home health RN to visit this afternoon.  Call to [name] [name] home, no answer.  Spoke with [name] [name] sister - discuss home visit for 1/30, and additional  resources being arranged by [facility] LCSW.  Placed call to [name] [name] to update regarding home visit.   Plan Will schedule home visit for 1/30 /18, for initial transition of care visit,   [name] GaribaldiKimberly India Jolin, RN, [facility] Care Management [phone] Mobile [phone]- Toll Free Main Office   [name] [name], [place]RN, [facility] Care Management [phone]- Mobile [phone]- Toll Free Main Office

## 2017-01-16 NOTE — Patient Outreach (Signed)
[facility](THN) Care Management  01/16/2017  [name] [name] January 26, 1934 [id number]   Phone call to [facility], spoke with April.  Referral completed for in home services for 30 days. Per Milinda CaveApirl , [name] may be able to go out to assess patient as early as tomorrow.    Adriana ReamsChrystal Kippy Gohman, LCSW [facility] Care Management [phone]

## 2017-01-16 NOTE — Patient Outreach (Signed)
[facility]N) Care Management  01/16/2017  [name] [name] [birth year] [id number]   Phone call to patient to discuss in home assistance that will be arranged through [facility].  It was explained that they will provide in home services for one month to assist with the transition home from rehab.  The family will need to be working on a long term  care plan during the next 30 days.  Patient's sister walked in the home during this phone conversation and was informed of the above information. Both patient and her sister were appreciative of the services provided.    Adriana ReamsChrystal Estanislado Surgeon, LCSW [facility] Care Management [phone]

## 2017-01-17 ENCOUNTER — Encounter: Payer: Self-pay | Admitting: *Deleted

## 2017-01-17 ENCOUNTER — Other Ambulatory Visit: Payer: Self-pay | Admitting: *Deleted

## 2017-01-17 DIAGNOSIS — L89613 Pressure ulcer of right heel, stage 3: Secondary | ICD-10-CM | POA: Diagnosis not present

## 2017-01-17 DIAGNOSIS — L89510 Pressure ulcer of right ankle, unstageable: Secondary | ICD-10-CM | POA: Diagnosis not present

## 2017-01-17 DIAGNOSIS — J449 Chronic obstructive pulmonary disease, unspecified: Secondary | ICD-10-CM | POA: Diagnosis not present

## 2017-01-17 DIAGNOSIS — I130 Hypertensive heart and chronic kidney disease with heart failure and stage 1 through stage 4 chronic kidney disease, or unspecified chronic kidney disease: Secondary | ICD-10-CM | POA: Diagnosis not present

## 2017-01-17 DIAGNOSIS — N189 Chronic kidney disease, unspecified: Secondary | ICD-10-CM | POA: Diagnosis not present

## 2017-01-17 DIAGNOSIS — I5032 Chronic diastolic (congestive) heart failure: Secondary | ICD-10-CM | POA: Diagnosis not present

## 2017-01-17 DIAGNOSIS — L89620 Pressure ulcer of left heel, unstageable: Secondary | ICD-10-CM | POA: Diagnosis not present

## 2017-01-17 DIAGNOSIS — L89890 Pressure ulcer of other site, unstageable: Secondary | ICD-10-CM | POA: Diagnosis not present

## 2017-01-17 DIAGNOSIS — E1122 Type 2 diabetes mellitus with diabetic chronic kidney disease: Secondary | ICD-10-CM | POA: Diagnosis not present

## 2017-01-17 NOTE — Patient Outreach (Signed)
[facility](THN) Care Management  01/17/2017  Frederich Chickrnell Oyama [birth year] [id number]   [facility] stating that patient may be actively receiving in home sservices [facility]  [facility].  [facility], spoke with Tammy who stated that patient's case had been closed due to their inability to maintain contact with her. They were not aware of patient's hospital and SNF stays. Per Babette Relicammy, she will discuss patient with her supervisor and will return this social worker's call.    Adriana ReamsChrystal Cordai Rodrigue, LCSW [facility] Care Management [phone]

## 2017-01-17 NOTE — Patient Outreach (Addendum)
[facility]N) Care Management   01/18/2017  [name] [name] 08-16-1934 [id number]  [name] [name] is an 83 y.o. female  Subjective:  "I am doing the best I can".  Patient discussed having a wheeze, denies cough. Patient daughter present, reports patient has not taken her medication today and they didn't know what to do what all the 13 prescriptions . Patient's sister in law helps with giving lantus insulin.   Patient's report her appetite is good and her daughter states she is eating good.    Objective:  BP (!) 148/70 (BP Location: Left Arm, Patient Position: Sitting, Cuff Size: Large)   Pulse 80   Resp 20   SpO2 94%   Patient resting in hospital bed on arrival, slumped down in bed, her daughter [name] [name] sitting on her own rollator at foot of patient bed, she has just changed dressing to left foot states home health RN instructed her how to do it, she had also helped patient with personal care, changing her depends.   [facility], except Lantus insulin in refrigerator.  Review of Systems  Constitutional: Negative.   HENT: Negative.   Eyes: Negative.   Respiratory: Positive for wheezing.   Cardiovascular: Negative.   Gastrointestinal: Negative.   Genitourinary: Negative.        INCONTINENCE   Musculoskeletal: Positive for falls.  Skin: Positive for itching.  Neurological:       Generalized weakness   Endo/Heme/Allergies: Negative.   Psychiatric/Behavioral: Positive for memory loss.       Forgetfulness     Physical Exam  Constitutional: She is oriented to person, place, and time. She appears well-developed and well-nourished.  Cardiovascular: Normal rate and normal heart sounds.   Respiratory: Effort normal. She has wheezes.  GI: Soft.  Neurological: She is alert and oriented to person, place, and time.  Skin: Skin is warm and dry.     Psychiatric: She has a normal mood and affect. Her behavior is  normal. Judgment normal. Cognition and memory are impaired.  Forgetful     Encounter Medications:   Outpatient Encounter Prescriptions as of 01/17/2017  Medication Sig Note  . acetaminophen (TYLENOL) 325 MG tablet Take 2 tablets (650 mg total) by mouth every 6 (six) hours as needed for mild pain (temp > 101.5).   Marland Kitchen. aspirin EC 81 MG tablet Take 1 tablet by mouth 1 day or 1 dose.   Marland Kitchen. atorvastatin (LIPITOR) 10 MG tablet Take 1 tablet by mouth at bedtime.    . carvedilol (COREG) 3.125 MG tablet Take 1 tablet (3.125 mg total) by mouth 2 (two) times daily with a meal.   . clopidogrel (PLAVIX) 75 MG tablet Take 1 tablet (75 mg total) by mouth daily.   . collagenase (SANTYL) ointment Apply topically daily.   . diphenhydrAMINE (BENADRYL) 25 mg capsule Take 25 mg by mouth every 6 (six) hours as needed for itching.   . fluticasone-salmeterol (ADVAIR HFA) 115-21 MCG/ACT inhaler Inhale 2 puffs into the lungs 2 (two) times daily.   . furosemide (LASIX) 20 MG tablet Take 1 tablet (20 mg total) by mouth 2 (two) times daily.   Marland Kitchen. gabapentin (NEURONTIN) 100 MG capsule Take 2 capsules by mouth 2 (two) times daily. Take 2 capsules (200mg ) twice a day   . insulin glargine (LANTUS) 100 UNIT/ML injection Inject 0.1 mLs (10 Units total) into the skin daily.   Marland Kitchen. lisinopril (PRINIVIL,ZESTRIL) 2.5 MG tablet Take 1 tablet (2.5 mg  total) by mouth daily.   Marland Kitchen loratadine (CLARITIN) 10 MG tablet Take 10 mg by mouth daily.   . potassium chloride SA (K-DUR,KLOR-CON) 20 MEQ tablet Take 20 mEq by mouth daily.   . Vitamin D, Ergocalciferol, (DRISDOL) 50000 units CAPS capsule Take 1 capsule by mouth once a week.   Marland Kitchen albuterol (PROVENTIL HFA;VENTOLIN HFA) 108 (90 Base) MCG/ACT inhaler Inhale 2 puffs into the lungs every 6 (six) hours as needed for wheezing or shortness of breath. (Patient not taking: Reported on 01/17/2017) 01/17/2017: Does not have have prescription , PCP office notified  . insulin aspart (NOVOLOG) 100 UNIT/ML  injection Inject 0-15 Units into the skin 3 (three) times daily with meals. (Patient not taking: Reported on 01/17/2017)    No facility-administered encounter medications on file as of 01/17/2017.   Patient was recently discharged from hospital and all medications have been reviewed.  Functional Status:   In your present state of health, do you have any difficulty performing the following activities: 01/17/2017 12/29/2016  Hearing? Y N  Vision? N N  Difficulty concentrating or making decisions? [name]  Walking or climbing stairs? Y Y  Dressing or bathing? Y Y  Doing errands, shopping? Y N  Preparing Food and eating ? Y -  Using the Toilet? Y -  In the past six months, have you accidently leaked urine? Y -  Do you have problems with loss of bowel control? Y -  Managing your Medications? Y -  Managing your Finances? Y -  Housekeeping or managing your Housekeeping? Y -  Some recent data might be hidden    Fall/Depression Screening:    PHQ 2/9 Scores 01/17/2017 11/30/2016  PHQ - 2 Score 1 0    Assessment:  Initial transition of care home visit . Patient's daughter present, sister in law [name] [name] , bath aide from well care completed personal care and assisted with getting patient out of bed to wheel chair, family states patient's brother will be able help with assist back to bed. Patient resting comfortably by report in the wheelchair.    Heart Failure No increase in swelling, no cough, no shortness of breath  talking in complete sentences, wheezing noted while lying in bed, lungs cleared after sitting up ,     Fall/Safety  Risk -  High Fall , and safety risk  , limitation in  personal care assistance in home, daughter is able to bring food to patient at bedside patient able to feed herself, requires 2 person assist for getting out of bed.    Diabetes  Family member (sister in law)  giving insulin as prescribed, unable to find a working cbg monitor in the home,  all strips expired 2015. Patient  denies symptoms of low blood sugar as symptoms described,Review steps of treating low blood sugar.   Medications -   Has medication list from discharge from [facility], all prescriptions present except albuterol . Will need assistance in setting up pill organizer, patient will need assistance with getting to her medication and taking. Explained to daughter, unsure of her ability to assist , she will need reinforcement . Will benefit from blister packaging and pharmacy consult on greater than 14 medication , adherence concerns.   Wound care/Skin breakdown prevention. Daughter changed dressing to left foot wound, not observed during visit, right wound ankle wound dry, redressed. Need reinforcement of wound care management and prevention .   Patient will need post discharge PCP visit scheduled, patient states her brother  should be able to assist with getting to MD .  Patient received call during visit from [facility] regarding setting up a home evaluation visit , patient agreed to 1/31 at 10 am.   Plan:  Placed call to Dr.Hande office representative to relay message regarding need for albuterol inhaler per discharge medication list from [facility], need for new cbg meter and strips as well as patient verbalized agreement with purchasing. Scheduled post hospital visit to PCP. Received return call from Autumn at PCP office verifying [facility], will update family.   Will place [facility] pharmacy consult for medication review, options of blister packaging of medications.  Pill organizer set up according to discharge list, assisted patient with taking medications for today and explained to daughter/sister in law next dose due, and daily schedule morning , evening and bedtime, will need reinforcement .   Will plan follow up transition of care outreach in next week, or sooner if needs arise. Will collaborate with [employer] , LCSW regarding  visit today concerns.   Will send MD initial visit note.    [facility] CM Care Plan Problem One   Flowsheet Row Most Recent Value  Care Plan Problem One  High risk for hospital readmission related to recent discharge from rehab stay and  chronic disease states    Role Documenting the Problem One  Care Management Coordinator  Care Plan for Problem One  Active  [facility]N Long Term Goal (31-90 days)  Patient will not experience a hospital admission in the next 31 days   [facility]N Long Term Goal Start Date  01/13/17  Interventions for Problem One Long Term Goal  Discussed importance taking medications as prescribed, will place [facility] pharmacy consult, provided [facility] packet and reviewed.   THN CM Short Term Goal #1 (0-30 days)  Patient/caregivers will begin to recognize signs of worsening heart failure symptoms over the next 30 days   THN CM Short Term Goal #1 Start Date  01/13/17  Interventions for Short Term Goal #1  Reviewed with patient/daughter symptoms of yellow zone to notify MD ,provided [facility] calendar book with handout.,   THN CM Short Term Goal #2 (0-30 days)  Patient/caregivers  will monitor and record  blood sugar at least daily in the next 30 days    THN CM Short Term Goal #2 Start Date  01/13/17  Interventions for Short Term Goal #2  Placed call to PCP regarding patient need for CBG meter, will arrange education of use as needed.   THN CM Short Term Goal #3 (0-30 days)  Patient will not experience a fall in the next 30 days   THN CM Short Term Goal #3 Start Date  01/17/17  Interventions for Short Tern Goal #3  Reviewed [facility] home safety handout, reminded to keep bed in lowest position , make sure chair is locked if assisting patiient from bed.   THN CM Short Term Goal #4 (0-30 days)  Patient caregiver report increased knowledge of measures to prevent skin breakdown in the next 30 days   [facility]N CM Short Term Goal #4 Start Date  01/17/17  Interventions for Short Term Goal #4  Discussed keeping skin dry, limit  staying in wet diapers for long periods, changing positions while in bed, at least every 2 hours during the day, keeps heels elevated off bed      [name], RN, [facility] Care Management [phone]- Mobile [phone]- Toll Free Main Office

## 2017-01-18 ENCOUNTER — Other Ambulatory Visit: Payer: Self-pay | Admitting: Pharmacist

## 2017-01-18 ENCOUNTER — Other Ambulatory Visit: Payer: Self-pay | Admitting: *Deleted

## 2017-01-18 DIAGNOSIS — L89613 Pressure ulcer of right heel, stage 3: Secondary | ICD-10-CM | POA: Diagnosis not present

## 2017-01-18 DIAGNOSIS — E1122 Type 2 diabetes mellitus with diabetic chronic kidney disease: Secondary | ICD-10-CM | POA: Diagnosis not present

## 2017-01-18 DIAGNOSIS — N189 Chronic kidney disease, unspecified: Secondary | ICD-10-CM | POA: Diagnosis not present

## 2017-01-18 DIAGNOSIS — L89620 Pressure ulcer of left heel, unstageable: Secondary | ICD-10-CM | POA: Diagnosis not present

## 2017-01-18 DIAGNOSIS — I5020 Unspecified systolic (congestive) heart failure: Secondary | ICD-10-CM | POA: Diagnosis not present

## 2017-01-18 DIAGNOSIS — L89890 Pressure ulcer of other site, unstageable: Secondary | ICD-10-CM | POA: Diagnosis not present

## 2017-01-18 DIAGNOSIS — M6281 Muscle weakness (generalized): Secondary | ICD-10-CM | POA: Diagnosis not present

## 2017-01-18 DIAGNOSIS — I5032 Chronic diastolic (congestive) heart failure: Secondary | ICD-10-CM | POA: Diagnosis not present

## 2017-01-18 DIAGNOSIS — J449 Chronic obstructive pulmonary disease, unspecified: Secondary | ICD-10-CM | POA: Diagnosis not present

## 2017-01-18 DIAGNOSIS — L89510 Pressure ulcer of right ankle, unstageable: Secondary | ICD-10-CM | POA: Diagnosis not present

## 2017-01-18 DIAGNOSIS — J9601 Acute respiratory failure with hypoxia: Secondary | ICD-10-CM | POA: Diagnosis not present

## 2017-01-18 DIAGNOSIS — R42 Dizziness and giddiness: Secondary | ICD-10-CM | POA: Diagnosis not present

## 2017-01-18 DIAGNOSIS — I130 Hypertensive heart and chronic kidney disease with heart failure and stage 1 through stage 4 chronic kidney disease, or unspecified chronic kidney disease: Secondary | ICD-10-CM | POA: Diagnosis not present

## 2017-01-18 DIAGNOSIS — E1140 Type 2 diabetes mellitus with diabetic neuropathy, unspecified: Secondary | ICD-10-CM | POA: Diagnosis not present

## 2017-01-18 DIAGNOSIS — Z5189 Encounter for other specified aftercare: Secondary | ICD-10-CM | POA: Diagnosis not present

## 2017-01-18 DIAGNOSIS — Z9181 History of falling: Secondary | ICD-10-CM | POA: Diagnosis not present

## 2017-01-18 NOTE — Patient Outreach (Signed)
[facility]N) Care Management  01/18/2017  Frederich Chickrnell Heying 1934/06/02 [id number]   Phone call from Tammy care management through [facility] who stated that they have completed their assessment of patient and feel that they will be able to offer in home assistance, however services may not start for approximately 1 week.  Per Tammy, there is more of the intake process to complete.  Phone number provided to [facility] Ander PurpuraKim Glover in this social worker's absence next week.  Plan: This Child psychotherapistsocial worker will follow up with patient within 2 weeks.    Adriana ReamsChrystal Land, LCSW [facility] Care Management [phone]

## 2017-01-18 NOTE — Patient Outreach (Signed)
[facility]N) Care Management  01/18/2017  [name] [name] March 14, 1934 [id number]   83 year old female with recent discharge home from SNF [facility], [facility] for assistance with medication management and transitioning to blister packs for improved medication adherence.    Unsuccessful outreach phone-call to patient and family today.  HIPAA compliant voicemails left to patient and patient's daughter [name] [name].  Will re-try calling patient later this week.    [name] [name], PharmD, [facility] Clinical Pharmacist [facility] [phone]

## 2017-01-18 NOTE — Patient Outreach (Signed)
[facility]N) Care Management  01/18/2017  [name] [name] December 31, 1933 [id number]   Late Entry-return phone call from Tammy through [facility].  Per Tammy, patient's case was closed for in home services due to unsafe living environment.  Per [name] [name], the program coordinator [name] [name] will re-evaluate patient on 01/18/17 to determine if patient is appropriate for their program.  Tammy will follow up with this social worker with the results of the  re-evaluation.   [name] ReamsChrystal Land, LCSW [facility] Care Management [phone]

## 2017-01-18 NOTE — Patient Outreach (Addendum)
[facility]) Care Management  01/18/2017  [name] [name] [birth year] [id number]   Placed call to [name] [name],neive , POA to update regarding home visit on 1/30 and need for new prescription for albuterol and blood sugar meter [facility], able to leave a hipaa compliant message requesting return call.  Plan  Will await return call if no response will plan return call.     Incoming call from patient's sister [name] [name], to discuss visit from yesterday discussed concerns regarding patient daily medications, states she will ask her sister in law [name] [name] that gives insulin to help with medications daily. Also discussed new prescriptions to be picked up at pharmacy for meter, and albuterol rescue inhaler, she states her brother normally does that and to let [name] [name] know.  [name] EtienneOctiva states again she will ask sister in law to help with checking blood sugar.  Informed her of [facility] visit for evaluation on today.   [name] [name] Returned call from [name] [name] , hipaa information verified, discussed home visit from yesterday,and plan for representative from [name] elder care to visit for evaluation .  Discussed patient has prescription for albuterol rescue inhaler and [facility]. Discussed patient post hospital PCP visit scheduled for 2/8 at pm, [name] [name] states her father is usually is able to take patient to visit but she is sure now, since patient is weaker than a few months ago,.   Plan Will discuss with [name] [name] family concern regarding transportation to doctor visit due to patient limited mobility.       [name] GaribaldiKimberly Glover, RN, [name] Care Management [phone]- Mobile [phone]- Toll Free Main Office

## 2017-01-19 ENCOUNTER — Encounter: Payer: Self-pay | Admitting: Pharmacist

## 2017-01-19 ENCOUNTER — Other Ambulatory Visit: Payer: Self-pay | Admitting: Pharmacist

## 2017-01-19 ENCOUNTER — Other Ambulatory Visit: Payer: Self-pay | Admitting: *Deleted

## 2017-01-19 NOTE — Patient Outreach (Signed)
[facility]N) Care Management  01/19/2017  [name] [name] [birth year] [id number]   Placed call to Yetta Glassmanabby Garner, POA for [name] She discussed that they have picked up prescription for albuterol inhaler from the pharmacy as well as meter, but [facility] to [facility] insurance regarding strips for meter.  [name] states they found up to date strips for the embrace meter she has at home and plan to use it to check her blood sugar on today.  Provided [employer] transportation contact information for scheduling ride to PCP follow up visit on next week 2/8, informed her to give information regarding patient is non transferable and will need Zenaida Niecevan with lift, she verbalized understanding of needing of needing to notify company at least 3 days in advance.   [name] discussed home health RN and therapist visited on yesterday.   Discussed option of [employer] well dine benefit, and she is agreeable and I will assist with placing call.  Plan Will follow up with transition of care telephonic outreach in the next week. Will place call to [employer] well dine meals to arrange discharge meals    Egbert GaribaldiKimberly Shanika Levings, RN, [facility] Care Management [phone]- Mobile [phone]- Toll Free Main Office

## 2017-01-19 NOTE — Patient Outreach (Signed)
[facility]) Care Management  [facility] CM Pharmacy   01/19/2017  [name] [name] October 25, 1934 [id number]  Subjective:  83 yo female with recent hospitalization for respiratory failure secondary to acute CHF and COPD exacerbation.  Patient followed by [facility] SW and RN and was referred to [facility] pharmacy for medication management and transition to blister packaging of medications.   Successful patient outreach phone-call to patient and patient's family member, [name] [name].  HIPAA identifiers verified.  [name] [name] stated she had all current medications including rescue inhaler, albuterol, and new CBG meter which she had picked up today.  She stated she still needs the correct test strips for the new meter but has already contacted patient's provider office to call in prescription.  [name] [name] was unable to verbalize names of medications that patient is currently taking over the phone for a medication reconciliation.  [name] [name] requested blister packaging and home delivery service if possible for patient.     Objective:   Encounter Medications: Outpatient Encounter Prescriptions as of 01/19/2017  Medication Sig Note  . acetaminophen (TYLENOL) 325 MG tablet Take 2 tablets (650 mg total) by mouth every 6 (six) hours as needed for mild pain (temp > 101.5).   Marland Kitchen albuterol (PROVENTIL HFA;VENTOLIN HFA) 108 (90 Base) MCG/ACT inhaler Inhale 2 puffs into the lungs every 6 (six) hours as needed for wheezing or shortness of breath. (Patient not taking: Reported on 01/17/2017) 01/17/2017: Does not have have prescription , PCP office notified  . aspirin EC 81 MG tablet Take 1 tablet by mouth 1 day or 1 dose.   Marland Kitchen atorvastatin (LIPITOR) 10 MG tablet Take 1 tablet by mouth at bedtime.    . carvedilol (COREG) 3.125 MG tablet Take 1 tablet (3.125 mg total) by mouth 2 (two) times daily with a meal.   . clopidogrel (PLAVIX) 75 MG tablet Take 1 tablet (75 mg total) by mouth daily.   . collagenase (SANTYL) ointment Apply topically  daily.   . diphenhydrAMINE (BENADRYL) 25 mg capsule Take 25 mg by mouth every 6 (six) hours as needed for itching.   . fluticasone-salmeterol (ADVAIR HFA) 115-21 MCG/ACT inhaler Inhale 2 puffs into the lungs 2 (two) times daily.   . furosemide (LASIX) 20 MG tablet Take 1 tablet (20 mg total) by mouth 2 (two) times daily.   Marland Kitchen gabapentin (NEURONTIN) 100 MG capsule Take 2 capsules by mouth 2 (two) times daily. Take 2 capsules (200mg ) twice a day   . insulin aspart (NOVOLOG) 100 UNIT/ML injection Inject 0-15 Units into the skin 3 (three) times daily with meals. (Patient not taking: Reported on 01/17/2017)   . insulin glargine (LANTUS) 100 UNIT/ML injection Inject 0.1 mLs (10 Units total) into the skin daily.   Marland Kitchen lisinopril (PRINIVIL,ZESTRIL) 2.5 MG tablet Take 1 tablet (2.5 mg total) by mouth daily.   Marland Kitchen loratadine (CLARITIN) 10 MG tablet Take 10 mg by mouth daily.   . potassium chloride SA (K-DUR,KLOR-CON) 20 MEQ tablet Take 20 mEq by mouth daily.   . Vitamin D, Ergocalciferol, (DRISDOL) 50000 units CAPS capsule Take 1 capsule by mouth once a week.    No facility-administered encounter medications on file as of 01/19/2017.     Functional Status: In your present state of health, do you have any difficulty performing the following activities: 01/17/2017 12/29/2016  Hearing? Y N  Vision? N N  Difficulty concentrating or making decisions? [name] [name]  Walking or climbing stairs? Y Y  Dressing or bathing? Y Y  Doing errands, shopping? [name] [name]  Preparing Food and eating ? Y -  Using the Toilet? Y -  In the past six months, have you accidently leaked urine? Y -  Do you have problems with loss of bowel control? Y -  Managing your Medications? Y -  Managing your Finances? Y -  Housekeeping or managing your Housekeeping? Y -  Some recent data might be hidden    Fall/Depression Screening: PHQ 2/9 Scores 01/17/2017 11/30/2016  PHQ - 2 Score 1 0    Assessment: Patient with [employer] Gold Plus insurance and  appears to have Extra Help  based on [facility] [place].   [name] therefore eligible for co-pay savings if prescriptions are filled for 90 day supply with retail pharmacy or [facility] mail order pharmacy.   Called two independent pharmacies on behalf of patient for information regarding blister packaging.    [facility] in [place]: $1.50 / blister card (individual medications per card) and delivery fee waived.   [facility] [place]:  $1.00/ blister card (multiple medications per card) + $5 delivery fee.  Plan:  Home visit scheduled with patient and family tomorrow at 10:30 AM.    Will perform medication reconciliation in person and assist further with blister packaging and 90 day supply retail vs mail order depending on patient preference.    [name] [name], PharmD, [facility] Clinical Pharmacist [facility] [phone]

## 2017-01-19 NOTE — Telephone Encounter (Signed)
This encounter was created in error - please disregard.

## 2017-01-20 ENCOUNTER — Other Ambulatory Visit: Payer: Self-pay | Admitting: Pharmacist

## 2017-01-20 DIAGNOSIS — I5032 Chronic diastolic (congestive) heart failure: Secondary | ICD-10-CM | POA: Diagnosis not present

## 2017-01-20 DIAGNOSIS — L89620 Pressure ulcer of left heel, unstageable: Secondary | ICD-10-CM | POA: Diagnosis not present

## 2017-01-20 DIAGNOSIS — L89510 Pressure ulcer of right ankle, unstageable: Secondary | ICD-10-CM | POA: Diagnosis not present

## 2017-01-20 DIAGNOSIS — N189 Chronic kidney disease, unspecified: Secondary | ICD-10-CM | POA: Diagnosis not present

## 2017-01-20 DIAGNOSIS — J449 Chronic obstructive pulmonary disease, unspecified: Secondary | ICD-10-CM | POA: Diagnosis not present

## 2017-01-20 DIAGNOSIS — I130 Hypertensive heart and chronic kidney disease with heart failure and stage 1 through stage 4 chronic kidney disease, or unspecified chronic kidney disease: Secondary | ICD-10-CM | POA: Diagnosis not present

## 2017-01-20 DIAGNOSIS — E1122 Type 2 diabetes mellitus with diabetic chronic kidney disease: Secondary | ICD-10-CM | POA: Diagnosis not present

## 2017-01-20 DIAGNOSIS — L89890 Pressure ulcer of other site, unstageable: Secondary | ICD-10-CM | POA: Diagnosis not present

## 2017-01-20 DIAGNOSIS — L89613 Pressure ulcer of right heel, stage 3: Secondary | ICD-10-CM | POA: Diagnosis not present

## 2017-01-20 NOTE — Patient Outreach (Signed)
[facility]) Care Management  [facility]   01/20/2017  Frederich Chickrnell Abe Oct 31, 1934 [id number]  Subjective: 83 yo female with recent hospitalization for respiratory failure secondary to acute CHF and COPD exacerbation.  [facility] and [facility] for medication management and transition to blister packaging of medications.   Successful home visit with patient and patient's family, Marquis Buggyappatha Garner and sister.  HIPAA identifiers verified.  Medication reconciliation performed at home in person with medication sources from pill bottles, blister pack cards, and recently filled pillbox. Patient's family states they would prefer to use mail order if this service provides more cost-savings on medications rather than paying for local pharmacy to fill medications with blister packaging.  Family unsure if medicine left in Advair HFA inhaler and requested counseling on how to use the Advair and Ventolin inhalers.  Patient out of weekly ergocalciferol.  Patient had several vials of Novolog 70/30 insulin in fridge from August 2018 but did not have any Lantus insulin. Patient's family states they called the provider for test strip refills for new meter (Accu-Check Aviva Plus) however the pharmacy was waiting for approval from insurance to fill prescription so family unable to check patient's blood sugar.  Family unsure of indication for loratadine.  Patient also had possession of bottle of Augmentin from September 2018. [facility] to assist patient with bath during visit and states she will come every Tuesday and Thursday for 30-60 minutes.    Objective:   Encounter Medications: Outpatient Encounter Prescriptions as of 01/20/2017  Medication Sig  . albuterol (PROVENTIL HFA;VENTOLIN HFA) 108 (90 Base) MCG/ACT inhaler Inhale 2 puffs into the lungs every 6 (six) hours as needed for wheezing or shortness of breath.  Marland Kitchen. aspirin 81 MG  chewable tablet Chew 81 mg by mouth daily.  Marland Kitchen. atorvastatin (LIPITOR) 10 MG tablet Take 1 tablet by mouth at bedtime.   . carvedilol (COREG) 3.125 MG tablet Take 1 tablet (3.125 mg total) by mouth 2 (two) times daily with a meal.  . clopidogrel (PLAVIX) 75 MG tablet Take 1 tablet (75 mg total) by mouth daily.  . collagenase (SANTYL) ointment Apply topically daily.  . fluticasone-salmeterol (ADVAIR HFA) 115-21 MCG/ACT inhaler Inhale 2 puffs into the lungs 2 (two) times daily.  . furosemide (LASIX) 20 MG tablet Take 1 tablet (20 mg total) by mouth 2 (two) times daily.  Marland Kitchen. gabapentin (NEURONTIN) 100 MG capsule Take 2 capsules by mouth 2 (two) times daily. Take 2 capsules (200mg ) twice a day  . lisinopril (PRINIVIL,ZESTRIL) 2.5 MG tablet Take 1 tablet (2.5 mg total) by mouth daily.  Marland Kitchen. loratadine (CLARITIN) 10 MG tablet Take 10 mg by mouth daily.  . potassium chloride SA (K-DUR,KLOR-CON) 20 MEQ tablet Take 20 mEq by mouth daily.  . Vitamin D, Ergocalciferol, (DRISDOL) 50000 units CAPS capsule Take 1 capsule by mouth once a week.  Marland Kitchen. acetaminophen (TYLENOL) 325 MG tablet Take 2 tablets (650 mg total) by mouth every 6 (six) hours as needed for mild pain (temp > 101.5). (Patient not taking: Reported on 01/20/2017)  . diphenhydrAMINE (BENADRYL) 25 mg capsule Take 25 mg by mouth every 6 (six) hours as needed for itching.  . insulin aspart (NOVOLOG) 100 UNIT/ML injection Inject 0-15 Units into the skin 3 (three) times daily with meals. (Patient not taking: Reported on 01/17/2017)  . insulin glargine (LANTUS) 100 UNIT/ML injection Inject 0.1 mLs (10 Units total) into the skin daily. (Patient  not taking: Reported on 01/20/2017)  . [DISCONTINUED] aspirin EC 81 MG tablet Take 1 tablet by mouth 1 day or 1 dose.   No facility-administered encounter medications on file as of 01/20/2017.     Functional Status: In your present state of health, do you have any difficulty performing the following activities: 01/17/2017  12/29/2016  Hearing? Y N  Vision? N N  Difficulty concentrating or making decisions? [name]  Walking or climbing stairs? Y Y  Dressing or bathing? Y Y  Doing errands, shopping? Y N  Preparing Food and eating ? Y -  Using the Toilet? Y -  In the past six months, have you accidently leaked urine? Y -  Do you have problems with loss of bowel control? Y -  Managing your Medications? Y -  Managing your Finances? Y -  Housekeeping or managing your Housekeeping? Y -  Some recent data might be hidden    Fall/Depression Screening: PHQ 2/9 Scores 01/17/2017 11/30/2016  PHQ - 2 Score 1 0    Assessment:  Drugs sorted by system: Neurologic/Psychologic: Gabapentin  Cardiovascular: Aspirin 81mg , atorvastatin, carvedilol, clopidogrel, furosemide, lisinopril  Pulmonary/Allergy: Ventolin, Advair, Claritin  Endocrine:Novolog 70/30  Topical: Collagenase ointment  Vitamins/Minerals: Ergocalciferol, Potassium Chloride   Other issues noted:   There were multiple medications with supplies from two different manufacturers and two different sources which was confusing for patient's family.    Patient did not have Lantus insulin as noted on recent discharge summary but instead was using  Vials of Novolog 70/30 insulin (45 units daily) from August 2017.    Family unable to check patient's blood sugar as [facility] waiting on insurance approval for test strips  Noted patient with history of stroke and currently on low-dose atorvastatin.  Is a higher dose of atorvastatin clinically warranted?    Family unsure of indication for loratadine (Claritin) as patient without seasonal allergies or cold.  Does this need to be scheduled vs PRN?  Plan:  1.  Left message at provider office twice to clarify insulin regimen.  If Lantus regimen correct, patient needs prescription called into pharmacy.  Requested refills on atorvastatin, potassium, and weekly ergocalciferol on behalf of patient.  Notified provider  office to expect faxes from [facility] mail order requesting 90 day supply on all active prescriptions for patient.  2.  Counseled patient's family on HFA inhaler technique via teachback method.  Reviewed priming with each new inhaler and if not used in more than 2 weeks. Recommended patient rinse mouth after Advair and how to monitor for thrush.  Reviewed how to clean plastic inhaler case.   3.  Coordinated initiation of 90 day supply of medications through mail order from [facility] as this will provide the most cost-savings for patient.  [facility] pharmacy will contact physician office for new prescriptions and medications should be mailed in 5-10 business days.   4.  Coordinated with [facility] pharmacy and able to assist with approval of override of prescription for test strips of the new CBG meter for patient.  Patient should be able to pick these test strips [facility]  5.  Reviewed [employer] OTC catalogue with patient's family and encouraged them to use this service each quarter as needed.   6.  Gave patient another weekly pillbox to use and spoke with [facility] RN, Ander Purpura, who will bring 3 more pillboxes to patient next week during home visit.  6.  Will follow-up via telephone call with patient and family  in 2 weeks to ensure medications from mail order have arrived.    Will route note to provider via in-basket.    Haynes Hoehn, PharmD, [facility] Clinical Pharmacist [facility] [employer] [phone]

## 2017-01-23 ENCOUNTER — Other Ambulatory Visit: Payer: Self-pay | Admitting: *Deleted

## 2017-01-23 DIAGNOSIS — I130 Hypertensive heart and chronic kidney disease with heart failure and stage 1 through stage 4 chronic kidney disease, or unspecified chronic kidney disease: Secondary | ICD-10-CM | POA: Diagnosis not present

## 2017-01-23 DIAGNOSIS — L89510 Pressure ulcer of right ankle, unstageable: Secondary | ICD-10-CM | POA: Diagnosis not present

## 2017-01-23 DIAGNOSIS — L89613 Pressure ulcer of right heel, stage 3: Secondary | ICD-10-CM | POA: Diagnosis not present

## 2017-01-23 DIAGNOSIS — I5032 Chronic diastolic (congestive) heart failure: Secondary | ICD-10-CM | POA: Diagnosis not present

## 2017-01-23 DIAGNOSIS — J449 Chronic obstructive pulmonary disease, unspecified: Secondary | ICD-10-CM | POA: Diagnosis not present

## 2017-01-23 DIAGNOSIS — L89620 Pressure ulcer of left heel, unstageable: Secondary | ICD-10-CM | POA: Diagnosis not present

## 2017-01-23 DIAGNOSIS — E1122 Type 2 diabetes mellitus with diabetic chronic kidney disease: Secondary | ICD-10-CM | POA: Diagnosis not present

## 2017-01-23 DIAGNOSIS — N189 Chronic kidney disease, unspecified: Secondary | ICD-10-CM | POA: Diagnosis not present

## 2017-01-23 DIAGNOSIS — L89890 Pressure ulcer of other site, unstageable: Secondary | ICD-10-CM | POA: Diagnosis not present

## 2017-01-23 NOTE — Patient Outreach (Signed)
[facility]) Care Management  01/23/2017  Frederich Chickrnell Curet February 22, 1934 [id number]   Transition of care call   Spoke with [name] Glassmanabby Garner, states she plans to visit patient later today. She discussed concern regarding Lantus insulin, not sure if prescriptions went through. [name] discussed concern about difficulty family has in assisting patient back to bed, asking about a lift.She also is requesting a pull up and diaper be placed on her after aide does her bath. Discussed skin care and preventions related to incontinence.    [name] discussed that her father plans to provide transportation to PCP visit on 1/8 and then they will plan to use [employer] transportation to future visits.       Plan Pl[facility]- verified Lantus is ready for pickup,will  update [name]. Placed Call [facility] spoke with Dwana, able to discuss [name] concerns of patient being evaluated for having a lift to assist out of bed ,requesting home health sign in on book at there visit so she will know who to follow up with also and requesting bath aide apply pull up and diaper at discharge.  Inform of anticipated [employer] well dine meal delivery date of 1/8 by UPS or [employer]. Will plan home visit on this week for follow up  and delivery of additional medication boxes.    Egbert GaribaldiKimberly Glover, RN, [facility] Care Management [phone] Mobile [phone] Toll Free Main Office

## 2017-01-24 DIAGNOSIS — L89613 Pressure ulcer of right heel, stage 3: Secondary | ICD-10-CM | POA: Diagnosis not present

## 2017-01-24 DIAGNOSIS — L89620 Pressure ulcer of left heel, unstageable: Secondary | ICD-10-CM | POA: Diagnosis not present

## 2017-01-24 DIAGNOSIS — N189 Chronic kidney disease, unspecified: Secondary | ICD-10-CM | POA: Diagnosis not present

## 2017-01-24 DIAGNOSIS — E1122 Type 2 diabetes mellitus with diabetic chronic kidney disease: Secondary | ICD-10-CM | POA: Diagnosis not present

## 2017-01-24 DIAGNOSIS — J449 Chronic obstructive pulmonary disease, unspecified: Secondary | ICD-10-CM | POA: Diagnosis not present

## 2017-01-24 DIAGNOSIS — I5032 Chronic diastolic (congestive) heart failure: Secondary | ICD-10-CM | POA: Diagnosis not present

## 2017-01-24 DIAGNOSIS — I130 Hypertensive heart and chronic kidney disease with heart failure and stage 1 through stage 4 chronic kidney disease, or unspecified chronic kidney disease: Secondary | ICD-10-CM | POA: Diagnosis not present

## 2017-01-24 DIAGNOSIS — L89510 Pressure ulcer of right ankle, unstageable: Secondary | ICD-10-CM | POA: Diagnosis not present

## 2017-01-24 DIAGNOSIS — L89890 Pressure ulcer of other site, unstageable: Secondary | ICD-10-CM | POA: Diagnosis not present

## 2017-01-25 ENCOUNTER — Inpatient Hospital Stay
Admission: EM | Admit: 2017-01-25 | Discharge: 2017-02-04 | DRG: 616 | Disposition: A | Discharge status: Skilled Nursing Facility | Payer: Medicare HMO | Attending: Internal Medicine | Admitting: Internal Medicine

## 2017-01-25 ENCOUNTER — Encounter: Payer: Self-pay | Admitting: Emergency Medicine

## 2017-01-25 ENCOUNTER — Emergency Department: Payer: Medicare HMO

## 2017-01-25 DIAGNOSIS — Z79899 Other long term (current) drug therapy: Secondary | ICD-10-CM

## 2017-01-25 DIAGNOSIS — J810 Acute pulmonary edema: Secondary | ICD-10-CM

## 2017-01-25 DIAGNOSIS — L89624 Pressure ulcer of left heel, stage 4: Secondary | ICD-10-CM | POA: Diagnosis present

## 2017-01-25 DIAGNOSIS — Z794 Long term (current) use of insulin: Secondary | ICD-10-CM

## 2017-01-25 DIAGNOSIS — I739 Peripheral vascular disease, unspecified: Secondary | ICD-10-CM | POA: Diagnosis not present

## 2017-01-25 DIAGNOSIS — E1169 Type 2 diabetes mellitus with other specified complication: Secondary | ICD-10-CM | POA: Diagnosis not present

## 2017-01-25 DIAGNOSIS — Z89512 Acquired absence of left leg below knee: Secondary | ICD-10-CM

## 2017-01-25 DIAGNOSIS — N189 Chronic kidney disease, unspecified: Secondary | ICD-10-CM | POA: Diagnosis not present

## 2017-01-25 DIAGNOSIS — R197 Diarrhea, unspecified: Secondary | ICD-10-CM | POA: Diagnosis not present

## 2017-01-25 DIAGNOSIS — L89899 Pressure ulcer of other site, unspecified stage: Secondary | ICD-10-CM | POA: Diagnosis not present

## 2017-01-25 DIAGNOSIS — L89620 Pressure ulcer of left heel, unstageable: Secondary | ICD-10-CM | POA: Diagnosis not present

## 2017-01-25 DIAGNOSIS — Z7982 Long term (current) use of aspirin: Secondary | ICD-10-CM

## 2017-01-25 DIAGNOSIS — I130 Hypertensive heart and chronic kidney disease with heart failure and stage 1 through stage 4 chronic kidney disease, or unspecified chronic kidney disease: Secondary | ICD-10-CM | POA: Diagnosis not present

## 2017-01-25 DIAGNOSIS — I959 Hypotension, unspecified: Secondary | ICD-10-CM | POA: Diagnosis not present

## 2017-01-25 DIAGNOSIS — L97429 Non-pressure chronic ulcer of left heel and midfoot with unspecified severity: Secondary | ICD-10-CM | POA: Diagnosis not present

## 2017-01-25 DIAGNOSIS — G309 Alzheimer's disease, unspecified: Secondary | ICD-10-CM | POA: Diagnosis not present

## 2017-01-25 DIAGNOSIS — M79673 Pain in unspecified foot: Secondary | ICD-10-CM | POA: Diagnosis not present

## 2017-01-25 DIAGNOSIS — L89890 Pressure ulcer of other site, unstageable: Secondary | ICD-10-CM | POA: Diagnosis not present

## 2017-01-25 DIAGNOSIS — E1159 Type 2 diabetes mellitus with other circulatory complications: Secondary | ICD-10-CM | POA: Diagnosis not present

## 2017-01-25 DIAGNOSIS — N183 Chronic kidney disease, stage 3 (moderate): Secondary | ICD-10-CM | POA: Diagnosis not present

## 2017-01-25 DIAGNOSIS — E1142 Type 2 diabetes mellitus with diabetic polyneuropathy: Secondary | ICD-10-CM | POA: Diagnosis present

## 2017-01-25 DIAGNOSIS — L89510 Pressure ulcer of right ankle, unstageable: Secondary | ICD-10-CM | POA: Diagnosis not present

## 2017-01-25 DIAGNOSIS — Z8673 Personal history of transient ischemic attack (TIA), and cerebral infarction without residual deficits: Secondary | ICD-10-CM | POA: Diagnosis not present

## 2017-01-25 DIAGNOSIS — L89603 Pressure ulcer of unspecified heel, stage 3: Secondary | ICD-10-CM | POA: Diagnosis not present

## 2017-01-25 DIAGNOSIS — E1152 Type 2 diabetes mellitus with diabetic peripheral angiopathy with gangrene: Secondary | ICD-10-CM | POA: Diagnosis not present

## 2017-01-25 DIAGNOSIS — M6281 Muscle weakness (generalized): Secondary | ICD-10-CM

## 2017-01-25 DIAGNOSIS — D649 Anemia, unspecified: Secondary | ICD-10-CM

## 2017-01-25 DIAGNOSIS — Z7902 Long term (current) use of antithrombotics/antiplatelets: Secondary | ICD-10-CM

## 2017-01-25 DIAGNOSIS — Z7401 Bed confinement status: Secondary | ICD-10-CM

## 2017-01-25 DIAGNOSIS — D62 Acute posthemorrhagic anemia: Secondary | ICD-10-CM | POA: Diagnosis not present

## 2017-01-25 DIAGNOSIS — L89513 Pressure ulcer of right ankle, stage 3: Secondary | ICD-10-CM | POA: Diagnosis present

## 2017-01-25 DIAGNOSIS — F0280 Dementia in other diseases classified elsewhere without behavioral disturbance: Secondary | ICD-10-CM | POA: Diagnosis not present

## 2017-01-25 DIAGNOSIS — R509 Fever, unspecified: Secondary | ICD-10-CM | POA: Diagnosis not present

## 2017-01-25 DIAGNOSIS — E119 Type 2 diabetes mellitus without complications: Secondary | ICD-10-CM | POA: Diagnosis not present

## 2017-01-25 DIAGNOSIS — E785 Hyperlipidemia, unspecified: Secondary | ICD-10-CM | POA: Diagnosis present

## 2017-01-25 DIAGNOSIS — E1149 Type 2 diabetes mellitus with other diabetic neurological complication: Secondary | ICD-10-CM | POA: Diagnosis not present

## 2017-01-25 DIAGNOSIS — I10 Essential (primary) hypertension: Secondary | ICD-10-CM | POA: Diagnosis not present

## 2017-01-25 DIAGNOSIS — R531 Weakness: Secondary | ICD-10-CM

## 2017-01-25 DIAGNOSIS — I5032 Chronic diastolic (congestive) heart failure: Secondary | ICD-10-CM | POA: Diagnosis not present

## 2017-01-25 DIAGNOSIS — J9601 Acute respiratory failure with hypoxia: Secondary | ICD-10-CM

## 2017-01-25 DIAGNOSIS — J449 Chronic obstructive pulmonary disease, unspecified: Secondary | ICD-10-CM | POA: Diagnosis present

## 2017-01-25 DIAGNOSIS — I509 Heart failure, unspecified: Secondary | ICD-10-CM | POA: Diagnosis not present

## 2017-01-25 DIAGNOSIS — E1122 Type 2 diabetes mellitus with diabetic chronic kidney disease: Secondary | ICD-10-CM | POA: Diagnosis present

## 2017-01-25 DIAGNOSIS — I96 Gangrene, not elsewhere classified: Secondary | ICD-10-CM | POA: Diagnosis present

## 2017-01-25 DIAGNOSIS — M86172 Other acute osteomyelitis, left ankle and foot: Secondary | ICD-10-CM | POA: Diagnosis present

## 2017-01-25 DIAGNOSIS — L89613 Pressure ulcer of right heel, stage 3: Secondary | ICD-10-CM | POA: Diagnosis not present

## 2017-01-25 DIAGNOSIS — R2681 Unsteadiness on feet: Secondary | ICD-10-CM

## 2017-01-25 DIAGNOSIS — Z87891 Personal history of nicotine dependence: Secondary | ICD-10-CM | POA: Diagnosis not present

## 2017-01-25 DIAGNOSIS — Z743 Need for continuous supervision: Secondary | ICD-10-CM | POA: Diagnosis not present

## 2017-01-25 DIAGNOSIS — I70248 Atherosclerosis of native arteries of left leg with ulceration of other part of lower left leg: Secondary | ICD-10-CM | POA: Diagnosis not present

## 2017-01-25 DIAGNOSIS — I70262 Atherosclerosis of native arteries of extremities with gangrene, left leg: Secondary | ICD-10-CM | POA: Diagnosis not present

## 2017-01-25 DIAGNOSIS — I5030 Unspecified diastolic (congestive) heart failure: Secondary | ICD-10-CM | POA: Diagnosis not present

## 2017-01-25 DIAGNOSIS — M86171 Other acute osteomyelitis, right ankle and foot: Secondary | ICD-10-CM | POA: Diagnosis not present

## 2017-01-25 DIAGNOSIS — Z7951 Long term (current) use of inhaled steroids: Secondary | ICD-10-CM

## 2017-01-25 DIAGNOSIS — M869 Osteomyelitis, unspecified: Secondary | ICD-10-CM | POA: Diagnosis not present

## 2017-01-25 DIAGNOSIS — F0390 Unspecified dementia without behavioral disturbance: Secondary | ICD-10-CM

## 2017-01-25 DIAGNOSIS — R6889 Other general symptoms and signs: Secondary | ICD-10-CM | POA: Diagnosis not present

## 2017-01-25 LAB — COMPREHENSIVE METABOLIC PANEL
ALBUMIN: 2.4 g/dL — AB (ref 3.5–5.0)
ALK PHOS: 78 U/L (ref 38–126)
ALT: 9 U/L — ABNORMAL LOW (ref 14–54)
AST: 14 U/L — AB (ref 15–41)
Anion gap: 6 (ref 5–15)
BILIRUBIN TOTAL: 0.5 mg/dL (ref 0.3–1.2)
BUN: 44 mg/dL — AB (ref 6–20)
CO2: 28 mmol/L (ref 22–32)
Calcium: 8.4 mg/dL — ABNORMAL LOW (ref 8.9–10.3)
Chloride: 106 mmol/L (ref 101–111)
Creatinine, Ser: 1.61 mg/dL — ABNORMAL HIGH (ref 0.44–1.00)
GFR calc Af Amer: 34 mL/min — ABNORMAL LOW (ref >60)
GFR calc non Af Amer: 29 mL/min — ABNORMAL LOW (ref >60)
GLUCOSE: 259 mg/dL — AB (ref 65–99)
POTASSIUM: 4.6 mmol/L (ref 3.5–5.1)
Sodium: 140 mmol/L (ref 135–145)
TOTAL PROTEIN: 7.3 g/dL (ref 6.5–8.1)

## 2017-01-25 LAB — CBC WITH DIFFERENTIAL/PLATELET
BASOS ABS: 0 10*3/uL (ref 0–0.1)
Basophils Relative: 0 %
EOS PCT: 1 %
Eosinophils Absolute: 0.1 10*3/uL (ref 0–0.7)
HCT: 22.8 % — ABNORMAL LOW (ref 35.0–47.0)
Hemoglobin: 7.6 g/dL — ABNORMAL LOW (ref 12.0–16.0)
Lymphocytes Relative: 13 %
Lymphs Abs: 0.9 10*3/uL — ABNORMAL LOW (ref 1.0–3.6)
MCH: 27.2 pg (ref 26.0–34.0)
MCHC: 33.3 g/dL (ref 32.0–36.0)
MCV: 81.7 fL (ref 80.0–100.0)
MONO ABS: 0.2 10*3/uL (ref 0.2–0.9)
MONOS PCT: 3 %
Neutro Abs: 5.9 10*3/uL (ref 1.4–6.5)
Neutrophils Relative %: 83 %
PLATELETS: 313 10*3/uL (ref 150–440)
RBC: 2.78 MIL/uL — ABNORMAL LOW (ref 3.80–5.20)
RDW: 20.2 % — AB (ref 11.5–14.5)
WBC: 7.1 10*3/uL (ref 3.6–11.0)

## 2017-01-25 LAB — GLUCOSE, CAPILLARY
GLUCOSE-CAPILLARY: 287 mg/dL — AB (ref 65–99)
GLUCOSE-CAPILLARY: 307 mg/dL — AB (ref 65–99)

## 2017-01-25 LAB — LACTIC ACID, PLASMA
LACTIC ACID, VENOUS: 0.8 mmol/L (ref 0.5–1.9)
Lactic Acid, Venous: 0.9 mmol/L (ref 0.5–1.9)

## 2017-01-25 MED ORDER — METHYLPREDNISOLONE SODIUM SUCC 125 MG IJ SOLR
80.0000 mg | INTRAMUSCULAR | Status: AC
  Administered 2017-01-25: 80 mg via INTRAVENOUS
  Filled 2017-01-25: qty 2

## 2017-01-25 MED ORDER — VANCOMYCIN HCL 10 G IV SOLR
1250.0000 mg | Freq: Once | INTRAVENOUS | Status: AC
  Administered 2017-01-25: 1250 mg via INTRAVENOUS
  Filled 2017-01-25: qty 1250

## 2017-01-25 MED ORDER — ACETAMINOPHEN 325 MG PO TABS: 650.0000 mg | ORAL_TABLET | Freq: Four times a day (QID) | ORAL | Status: DC | PRN

## 2017-01-25 MED ORDER — ONDANSETRON HCL 4 MG/2ML IJ SOLN
4.0000 mg | Freq: Four times a day (QID) | INTRAMUSCULAR | Status: DC | PRN
  Administered 2017-02-01: 4 mg via INTRAVENOUS
  Filled 2017-01-25: qty 2

## 2017-01-25 MED ORDER — FUROSEMIDE 20 MG PO TABS
20.0000 mg | ORAL_TABLET | Freq: Two times a day (BID) | ORAL | Status: DC
  Administered 2017-01-25 – 2017-02-04 (×21): 20 mg via ORAL
  Filled 2017-01-25 (×20): qty 1

## 2017-01-25 MED ORDER — INSULIN GLARGINE 100 UNIT/ML ~~LOC~~ SOLN
10.0000 [IU] | Freq: Every day | SUBCUTANEOUS | Status: DC
  Administered 2017-01-26: 10 [IU] via SUBCUTANEOUS
  Filled 2017-01-25: qty 0.1

## 2017-01-25 MED ORDER — ACETAMINOPHEN 650 MG RE SUPP: 650.0000 mg | Freq: Four times a day (QID) | RECTAL | Status: DC | PRN

## 2017-01-25 MED ORDER — MOMETASONE FURO-FORMOTEROL FUM 200-5 MCG/ACT IN AERO
2.0000 | INHALATION_SPRAY | Freq: Two times a day (BID) | RESPIRATORY_TRACT | Status: DC
  Administered 2017-01-25 – 2017-02-04 (×18): 2 via RESPIRATORY_TRACT
  Filled 2017-01-25 (×2): qty 8.8

## 2017-01-25 MED ORDER — ACETAMINOPHEN 325 MG PO TABS
650.0000 mg | ORAL_TABLET | Freq: Four times a day (QID) | ORAL | Status: DC | PRN
  Administered 2017-01-29 (×2): 650 mg via ORAL
  Filled 2017-01-25 (×2): qty 2

## 2017-01-25 MED ORDER — LORATADINE 10 MG PO TABS
10.0000 mg | ORAL_TABLET | Freq: Every day | ORAL | Status: DC
  Administered 2017-01-26 – 2017-02-04 (×10): 10 mg via ORAL
  Filled 2017-01-25 (×11): qty 1

## 2017-01-25 MED ORDER — CLOPIDOGREL BISULFATE 75 MG PO TABS
75.0000 mg | ORAL_TABLET | Freq: Every day | ORAL | Status: DC
  Administered 2017-01-26 – 2017-02-04 (×9): 75 mg via ORAL
  Filled 2017-01-25 (×10): qty 1

## 2017-01-25 MED ORDER — VANCOMYCIN HCL IN DEXTROSE 1-5 GM/200ML-% IV SOLN: 1000.0000 mg | Freq: Once | INTRAVENOUS | Status: DC

## 2017-01-25 MED ORDER — ALBUTEROL SULFATE (2.5 MG/3ML) 0.083% IN NEBU: 2.5000 mg | INHALATION_SOLUTION | Freq: Four times a day (QID) | RESPIRATORY_TRACT | Status: DC | PRN

## 2017-01-25 MED ORDER — LEVOFLOXACIN IN D5W 750 MG/150ML IV SOLN
750.0000 mg | Freq: Once | INTRAVENOUS | Status: AC
  Administered 2017-01-25: 750 mg via INTRAVENOUS
  Filled 2017-01-25: qty 150

## 2017-01-25 MED ORDER — CARVEDILOL 3.125 MG PO TABS
3.1250 mg | ORAL_TABLET | Freq: Two times a day (BID) | ORAL | Status: DC
  Administered 2017-01-25 – 2017-01-27 (×5): 3.125 mg via ORAL
  Filled 2017-01-25 (×5): qty 1

## 2017-01-25 MED ORDER — PIPERACILLIN-TAZOBACTAM 3.375 G IVPB
3.3750 g | Freq: Three times a day (TID) | INTRAVENOUS | Status: DC
  Administered 2017-01-26 – 2017-02-02 (×21): 3.375 g via INTRAVENOUS
  Filled 2017-01-25 (×24): qty 50

## 2017-01-25 MED ORDER — ALBUTEROL SULFATE HFA 108 (90 BASE) MCG/ACT IN AERS: 2.0000 | INHALATION_SPRAY | Freq: Four times a day (QID) | RESPIRATORY_TRACT | Status: DC | PRN

## 2017-01-25 MED ORDER — ENOXAPARIN SODIUM 30 MG/0.3ML ~~LOC~~ SOLN
30.0000 mg | SUBCUTANEOUS | Status: DC
  Administered 2017-01-25 – 2017-01-26 (×2): 30 mg via SUBCUTANEOUS
  Filled 2017-01-25 (×2): qty 0.3

## 2017-01-25 MED ORDER — INSULIN ASPART 100 UNIT/ML ~~LOC~~ SOLN
0.0000 [IU] | Freq: Every day | SUBCUTANEOUS | Status: DC
  Administered 2017-01-25: 4 [IU] via SUBCUTANEOUS
  Administered 2017-01-26 – 2017-01-27 (×2): 2 [IU] via SUBCUTANEOUS
  Administered 2017-01-28: 3 [IU] via SUBCUTANEOUS
  Administered 2017-01-29 – 2017-02-03 (×3): 2 [IU] via SUBCUTANEOUS
  Filled 2017-01-25: qty 5
  Filled 2017-01-25: qty 3
  Filled 2017-01-25: qty 4
  Filled 2017-01-25 (×5): qty 2

## 2017-01-25 MED ORDER — ONDANSETRON HCL 4 MG PO TABS: 4.0000 mg | ORAL_TABLET | Freq: Four times a day (QID) | ORAL | Status: DC | PRN

## 2017-01-25 MED ORDER — VITAMIN D (ERGOCALCIFEROL) 1.25 MG (50000 UNIT) PO CAPS
50000.0000 [IU] | ORAL_CAPSULE | ORAL | Status: DC
  Administered 2017-01-25 – 2017-02-01 (×2): 50000 [IU] via ORAL
  Filled 2017-01-25 (×2): qty 1

## 2017-01-25 MED ORDER — ALBUTEROL SULFATE (2.5 MG/3ML) 0.083% IN NEBU
5.0000 mg | INHALATION_SOLUTION | Freq: Once | RESPIRATORY_TRACT | Status: AC
  Administered 2017-01-25: 5 mg via RESPIRATORY_TRACT
  Filled 2017-01-25: qty 6

## 2017-01-25 MED ORDER — ATORVASTATIN CALCIUM 10 MG PO TABS
10.0000 mg | ORAL_TABLET | Freq: Every day | ORAL | Status: DC
  Administered 2017-01-25 – 2017-02-03 (×10): 10 mg via ORAL
  Filled 2017-01-25 (×10): qty 1

## 2017-01-25 MED ORDER — GABAPENTIN 100 MG PO CAPS
200.0000 mg | ORAL_CAPSULE | Freq: Two times a day (BID) | ORAL | Status: DC
  Administered 2017-01-25 – 2017-02-04 (×20): 200 mg via ORAL
  Filled 2017-01-25 (×20): qty 2

## 2017-01-25 MED ORDER — ASPIRIN EC 81 MG PO TBEC
81.0000 mg | DELAYED_RELEASE_TABLET | Freq: Every day | ORAL | Status: DC
  Administered 2017-01-26 – 2017-02-04 (×10): 81 mg via ORAL
  Filled 2017-01-25 (×11): qty 1

## 2017-01-25 MED ORDER — CEFEPIME-DEXTROSE 2 GM/50ML IV SOLR
2.0000 g | Freq: Once | INTRAVENOUS | Status: AC
  Administered 2017-01-25: 2 g via INTRAVENOUS
  Filled 2017-01-25: qty 50

## 2017-01-25 MED ORDER — LISINOPRIL 5 MG PO TABS
2.5000 mg | ORAL_TABLET | Freq: Every day | ORAL | Status: DC
  Administered 2017-01-26 – 2017-01-27 (×2): 2.5 mg via ORAL
  Filled 2017-01-25 (×2): qty 1

## 2017-01-25 MED ORDER — POTASSIUM CHLORIDE CRYS ER 20 MEQ PO TBCR
20.0000 meq | EXTENDED_RELEASE_TABLET | Freq: Every day | ORAL | Status: DC
  Administered 2017-01-26: 20 meq via ORAL
  Filled 2017-01-25: qty 1

## 2017-01-25 MED ORDER — INSULIN ASPART 100 UNIT/ML ~~LOC~~ SOLN
0.0000 [IU] | Freq: Three times a day (TID) | SUBCUTANEOUS | Status: DC
  Administered 2017-01-25: 5 [IU] via SUBCUTANEOUS
  Administered 2017-01-26 – 2017-01-27 (×4): 7 [IU] via SUBCUTANEOUS
  Administered 2017-01-28 (×3): 2 [IU] via SUBCUTANEOUS
  Administered 2017-01-29: 1 [IU] via SUBCUTANEOUS
  Administered 2017-01-29 (×2): 3 [IU] via SUBCUTANEOUS
  Administered 2017-01-31 (×2): 2 [IU] via SUBCUTANEOUS
  Administered 2017-02-02 – 2017-02-03 (×5): 3 [IU] via SUBCUTANEOUS
  Administered 2017-02-03: 1 [IU] via SUBCUTANEOUS
  Administered 2017-02-04: 3 [IU] via SUBCUTANEOUS
  Administered 2017-02-04: 5 [IU] via SUBCUTANEOUS
  Administered 2017-02-04: 2 [IU] via SUBCUTANEOUS
  Filled 2017-01-25 (×2): qty 2
  Filled 2017-01-25: qty 7
  Filled 2017-01-25: qty 3
  Filled 2017-01-25: qty 2
  Filled 2017-01-25: qty 3
  Filled 2017-01-25: qty 18
  Filled 2017-01-25 (×2): qty 2
  Filled 2017-01-25 (×2): qty 7
  Filled 2017-01-25: qty 5
  Filled 2017-01-25: qty 2
  Filled 2017-01-25: qty 7
  Filled 2017-01-25: qty 1
  Filled 2017-01-25: qty 3
  Filled 2017-01-25: qty 5
  Filled 2017-01-25: qty 3
  Filled 2017-01-25: qty 7
  Filled 2017-01-25 (×3): qty 3
  Filled 2017-01-25: qty 1
  Filled 2017-01-25: qty 2

## 2017-01-25 MED ORDER — IPRATROPIUM-ALBUTEROL 0.5-2.5 (3) MG/3ML IN SOLN
3.0000 mL | Freq: Once | RESPIRATORY_TRACT | Status: AC
  Administered 2017-01-25: 3 mL via RESPIRATORY_TRACT
  Filled 2017-01-25: qty 3

## 2017-01-25 MED ORDER — VANCOMYCIN HCL 10 G IV SOLR
1250.0000 mg | INTRAVENOUS | Status: DC
  Administered 2017-01-26: 1250 mg via INTRAVENOUS
  Filled 2017-01-25 (×2): qty 1250

## 2017-01-25 NOTE — H&P (Signed)
[employer] [facility] [facility] [place] [facility]   PATIENT NAME: [name] [name]    MR#:  [id number]  DATE OF BIRTH:  01-13-1934  DATE OF ADMISSION:  01/25/2017  PRIMARY CARE PHYSICIAN: Barbette Reichmann, MD   REQUESTING/REFERRING PHYSICIAN: Dr. Alfonse Flavors  CHIEF COMPLAINT:   Chief Complaint  Patient presents with  . Fever  . Wound Check    HISTORY OF PRESENT ILLNESS:  [name] [name]  is a 83 y.o. female with a known history of Diabetes type 2 without complication, hypertension, history of previous CVA, history of congestive heart failure, who presented to the hospital due to left foot pain, suspected fever. Patient has had a left heel necrotic/eschared area which has more painful lately and also had a foul-smelling drainage from it. A home health nurse also noticed that it was more warm to touch and she had some subjective low-grade fever at 99 and therefore sent to the ER for further evaluation. In the emergency room patient underwent a x-ray of her left foot which showed evidence of osteomyelitis. Hospitalist services were contacted further treatment and evaluation.  PAST MEDICAL HISTORY:   Past Medical History:  Diagnosis Date  . Diabetes mellitus without complication (HCC)   . Hypertension   . Stroke [facility]     PAST SURGICAL HISTORY:   Past Surgical History:  Procedure Laterality Date  . PERIPHERAL VASCULAR CATHETERIZATION N/A 11/14/2016   Procedure: Lower Extremity Angiography;  Surgeon: Annice Needy, MD;  Location: [facility] INVASIVE CV LAB;  Service: Cardiovascular;  Laterality: N/A;  . PERIPHERAL VASCULAR CATHETERIZATION Right 11/21/2016   Procedure: Lower Extremity Angiography;  Surgeon: Annice Needy, MD;  Location: [facility] INVASIVE CV LAB;  Service: Cardiovascular;  Laterality: Right;  . PERIPHERAL VASCULAR CATHETERIZATION N/A 11/21/2016   Procedure: Abdominal Aortogram w/Lower Extremity;  Surgeon: Annice Needy, MD;  Location: [facility] INVASIVE CV LAB;  Service:  Cardiovascular;  Laterality: N/A;  . PERIPHERAL VASCULAR CATHETERIZATION  11/21/2016   Procedure: Lower Extremity Intervention;  Surgeon: Annice Needy, MD;  Location: [facility] INVASIVE CV LAB;  Service: Cardiovascular;;    SOCIAL HISTORY:   Social History  Substance Use Topics  . Smoking status: Former Smoker    Types: Cigarettes  . Smokeless tobacco: Never Used  . Alcohol use No    FAMILY HISTORY:   Family History  Problem Relation Age of Onset  . Stroke Mother   . Stroke Father   . Cancer Father   . Heart disease Paternal Grandfather   . Diabetes Sister   . Diabetes Brother     DRUG ALLERGIES:  No Known Allergies  REVIEW OF SYSTEMS:   Review of Systems  Constitutional: Positive for fever. Negative for weight loss.  HENT: Negative for congestion, nosebleeds and tinnitus.   Eyes: Negative for blurred vision, double vision and redness.  Respiratory: Negative for cough, hemoptysis and shortness of breath.   Cardiovascular: Negative for chest pain, orthopnea, leg swelling and PND.  Gastrointestinal: Negative for abdominal pain, diarrhea, melena, nausea and vomiting.  Genitourinary: Negative for dysuria, hematuria and urgency.  Musculoskeletal: Positive for joint pain (Left Foot pain. ). Negative for falls.  Neurological: Negative for dizziness, tingling, sensory change, focal weakness, seizures, weakness and headaches.  Endo/Heme/Allergies: Negative for polydipsia. Does not bruise/bleed easily.  Psychiatric/Behavioral: Negative for depression and memory loss. The patient is not nervous/anxious.     MEDICATIONS AT HOME:   Prior to Admission medications   Medication Sig Start Date End Date Taking? Authorizing Provider  acetaminophen (TYLENOL)  325 MG tablet Take 2 tablets (650 mg total) by mouth every 6 (six) hours as needed for mild pain (temp > 101.5). 11/23/16  Yes Ramonita LabAruna Gouru, MD  albuterol (PROVENTIL HFA;VENTOLIN HFA) 108 (90 Base) MCG/ACT inhaler Inhale 2 puffs into the  lungs every 6 (six) hours as needed for wheezing or shortness of breath. 11/23/16  Yes Ramonita LabAruna Gouru, MD  aspirin 81 MG tablet Chew 81 mg by mouth daily.   Yes Historical Provider, MD  atorvastatin (LIPITOR) 10 MG tablet Take 1 tablet by mouth at bedtime.  03/26/16  Yes Historical Provider, MD  carvedilol (COREG) 3.125 MG tablet Take 1 tablet (3.125 mg total) by mouth 2 (two) times daily with a meal. 09/17/16  Yes Katha HammingSnehalatha Konidena, MD  clopidogrel (PLAVIX) 75 MG tablet Take 1 tablet (75 mg total) by mouth daily. 11/16/16  Yes Enedina FinnerSona Patel, MD  collagenase (SANTYL) ointment Apply topically daily. 11/15/16  Yes Enedina FinnerSona Patel, MD  diphenhydrAMINE (BENADRYL) 25 mg capsule Take 25 mg by mouth every 6 (six) hours as needed for itching.   Yes Historical Provider, MD  fluticasone-salmeterol (ADVAIR HFA) 115-21 MCG/ACT inhaler Inhale 2 puffs into the lungs 2 (two) times daily. 11/23/16  Yes Ramonita LabAruna Gouru, MD  furosemide (LASIX) 20 MG tablet Take 1 tablet (20 mg total) by mouth 2 (two) times daily. 11/23/16  Yes Ramonita LabAruna Gouru, MD  gabapentin (NEURONTIN) 100 MG capsule Take 2 capsules by mouth 2 (two) times daily. Take 2 capsules (200mg ) twice a day 02/23/16  Yes Historical Provider, MD  insulin aspart (NOVOLOG) 100 UNIT/ML injection Inject 0-15 Units into the skin 3 (three) times daily with meals. 11/23/16  Yes Ramonita LabAruna Gouru, MD  insulin glargine (LANTUS) 100 UNIT/ML injection Inject 0.1 mLs (10 Units total) into the skin daily. 12/29/16  Yes Enedina FinnerSona Patel, MD  lisinopril (PRINIVIL,ZESTRIL) 2.5 MG tablet Take 1 tablet (2.5 mg total) by mouth daily. 09/18/16  Yes Katha HammingSnehalatha Konidena, MD  loratadine (CLARITIN) 10 MG tablet Take 10 mg by mouth daily.   Yes Historical Provider, MD  potassium chloride SA (K-DUR,KLOR-CON) 20 MEQ tablet Take 20 mEq by mouth daily.   Yes Historical Provider, MD  Vitamin D, Ergocalciferol, (DRISDOL) 50000 units CAPS capsule Take 1 capsule by mouth once a week. 03/26/16  Yes Historical Provider, MD      VITAL  SIGNS:  Blood pressure (!) 113/58, pulse 76, temperature 99 F (37.2 C), temperature source Oral, resp. rate 16, weight 72.8 kg (160 lb 9.6 oz), SpO2 92 %.  PHYSICAL EXAMINATION:  Physical Exam  GENERAL:  83 y.o.-year-old patient lying in the bed in no acute distress.  EYES: Pupils equal, round, reactive to light and accommodation. No scleral icterus. Extraocular muscles intact.  HEENT: Head atraumatic, normocephalic. Oropharynx and nasopharynx clear. No oropharyngeal erythema, moist oral mucosa  NECK:  Supple, no jugular venous distention. No thyroid enlargement, no tenderness.  LUNGS: Normal breath sounds bilaterally, no wheezing, rales, rhonchi. No use of accessory muscles of respiration.  CARDIOVASCULAR: S1, S2 RRR. No murmurs, rubs, gallops, clicks.  ABDOMEN: Soft, nontender, nondistended. Bowel sounds present. No organomegaly or mass.  EXTREMITIES: No pedal edema, cyanosis, or clubbing. + 2 pedal & radial pulses b/l.  Left heel necrotic ulcer with foul smelling drainage.  NEUROLOGIC: Cranial nerves II through XII are intact. No focal Motor or sensory deficits appreciated b/l. Globally weak.  PSYCHIATRIC: The patient is alert and oriented x 3  SKIN: No obvious rash, lesion, or Left foot hell ulcer with foul smelling drainage.  LABORATORY PANEL:   CBC  Recent Labs Lab 01/25/17 1227  WBC 7.1  HGB 7.6*  HCT 22.8*  PLT 313   ------------------------------------------------------------------------------------------------------------------  Chemistries   Recent Labs Lab 01/25/17 1227  NA 140  K 4.6  CL 106  CO2 28  GLUCOSE 259*  BUN 44*  CREATININE 1.61*  CALCIUM 8.4*  AST 14*  ALT 9*  ALKPHOS 78  BILITOT 0.5   ------------------------------------------------------------------------------------------------------------------  Cardiac Enzymes No results for input(s): TROPONINI in the last 168  hours. ------------------------------------------------------------------------------------------------------------------  RADIOLOGY:  Dg Chest 2 View  Result Date: 01/25/2017 CLINICAL DATA:  Fever and possible bilateral foot infection. EXAM: CHEST  2 VIEW COMPARISON:  Single-view of the chest 12/24/2016 and 11/21/2016. FINDINGS: Lung volumes are somewhat low with crowding of the bronchovascular structures. Bilateral interstitial opacities are most compatible with pulmonary edema. Aeration is improved compared to the prior exam. Small bilateral pleural effusions are noted. Heart size is upper normal. Aortic atherosclerosis is seen. IMPRESSION: Interstitial pulmonary edema with small bilateral pleural effusions. Electronically Signed   By: Drusilla Kanner M.D.   On: 01/25/2017 13:23   Dg Foot Complete Left  Result Date: 01/25/2017 CLINICAL DATA:  Necrotic ulcer on the left heel.  Fever. EXAM: LEFT FOOT - COMPLETE 3+ VIEW COMPARISON:  11/20/2016 FINDINGS: Diffuse patchy osteopenia. There is fragmentation of the cortex of the posterior aspect of the calcaneus adjacent to the soft tissue ulceration with gas in the soft tissues. The findings are consistent with osteomyelitis of the posterior aspect of the calcaneus. Extensive small vessel calcifications. Arthritic changes at the first MTP joint and of the IP joint of the great toe. IMPRESSION: Findings consistent with osteomyelitis of the posterior aspect of the calcaneus appear Electronically Signed   By: Francene Boyers M.D.   On: 01/25/2017 13:24     IMPRESSION AND PLAN:   83 year old female with past medical history of CHF chronic diastolic, hypertension, diabetes, seizure previous CVA who presents to the hospital due to her subjective fevers and left foot ulcer.  1. Left diabetic foot ulcer-patient has a foul-smelling left heel diabetic foot ulcer. Patient's x-ray does show evidence of osteomyelitis near the calcaneus. -We'll start patient on  antibiotics empirically with vancomycin, Zosyn. Maryclare Labrador get podiatry consult.  2. Diabetes type 2 without complication-continue Lantus, sliding scale insulin.  3. CHF-chronic diastolic CHF. -Clinically patient is not in congestive heart failure. Continue Lasix, carvedilol, lisinopril.  4. Hyperlipidemia-continue atorvastatin.  5. COPD-no acute exacerbation-continue Dulera, albuterol inhaler as needed.  6. Diabetic neuropathy-continue gabapentin.   All the records are reviewed and case discussed with ED provider. Management plans discussed with the patient, family and they are in agreement.  CODE STATUS: Full Code  TOTAL TIME TAKING CARE OF THIS PATIENT: 45 minutes.    Houston Siren M.D on 01/25/2017 at 3:07 PM  Between 7am to 6pm - Pager - [phone]  After 6pm go to [email or web address] - password [email or web address] [facility]  [facility]  Office  [phone]  CC: Primary care physician; Barbette Reichmann, MD

## 2017-01-25 NOTE — Progress Notes (Signed)
Pharmacy Antibiotic Note  [name] [name] is a 83 y.o. female admitted on 01/25/2017 with Osteomyelitis.  Pharmacy has been consulted for Vancomycin and Zosyn dosing. Patient received 1 dose  Vancomycin 1250mg , cefepime and levofloxacin in ED.   Plan: Ke: 0.028   T1/2: 25   Vd: 51  Will start the patient on Vancomycin 1250mg  IV every 26 hours with 12 hour stack dosing. Calculated trough at Css is 18. Plan for trough prior to 4th dose. Monitor renal function daily and adjust dose if needed.   Will start patient on Zosyn 3.375 IV EI every 8 hours.   Weight: 160 lb 9.6 oz (72.8 kg)  Temp (24hrs), Avg:99 F (37.2 C), Min:99 F (37.2 C), Max:99 F (37.2 C)   Recent Labs Lab 01/25/17 1226 01/25/17 1227  WBC  --  7.1  CREATININE  --  1.61*  LATICACIDVEN 0.8  --     Estimated Creatinine Clearance: 26.7 mL/min (by C-G formula based on SCr of 1.61 mg/dL (H)).    No Known Allergies  Antimicrobials this admission: 2/7 Levofloxacin/cefepime x 1 dose 2/7 Vancomycin >> 2/8 Zosyn  >>   Dose adjustments this admission:   Microbiology results: 2/7 BCx: sent  Thank you for allowing pharmacy to be a part of this patient's care.  Gardner CandleSheema M Keil Pickering, PharmD, BCPS Clinical Pharmacist 01/25/2017 3:31 PM

## 2017-01-25 NOTE — ED Notes (Addendum)
[phone] Evalina FieldOctive Garner, sister  [phone] Jillyn HiddenGary, brother

## 2017-01-25 NOTE — Progress Notes (Signed)
   [employer] [facility] [place] [facility]   Advance care planning  Hospital Day: 0 days [name] [name] is a 83 y.o. female presenting with Fever and Wound Check .   Advance care planning discussed with patient  with additional Family at bedside. All questions in regards to overall condition and expected prognosis answered. The decision was made to continue current code status  CODE STATUS: full Time spent: 16 minutes

## 2017-01-25 NOTE — ED Triage Notes (Signed)
Pt to ED via EMS from home, per EMS home health care nurse for fever and possible infection to bilat feet. EMS  Oral temp of 99.2, pt was 89% on RA, placed on 2L. Pt A&Ox4

## 2017-01-25 NOTE — Progress Notes (Signed)
Pt received from ED alert and oriented. Denies pain. Dr. Orland Jarredroxler tp bedside to do debridement of eschar to left heel. Heel wrapped by Dr. Orland Jarredroxler with gauze and kerlex. Culture sent to lab obtained by Dr. Orland Jarredroxler.

## 2017-01-25 NOTE — Consult Note (Signed)
Patient Demographics  [name] [name], is a 83 y.o. female   MRN: [id number]   DOB - October 17, 1934  Admit Date - 01/25/2017    Outpatient Primary MD for the patient is Barbette Reichmann, MD  Consult requested in the Hospital by Houston Siren, MD, On 01/25/2017    Reason for consult decubitus ulceration posterior left heel   With History of -  Past Medical History:  Diagnosis Date  . Diabetes mellitus without complication (HCC)   . Hypertension   . Stroke [facility])       Past Surgical History:  Procedure Laterality Date  . PERIPHERAL VASCULAR CATHETERIZATION N/A 11/14/2016   Procedure: Lower Extremity Angiography;  Surgeon: Annice Needy, MD;  Location: [facility] INVASIVE CV LAB;  Service: Cardiovascular;  Laterality: N/A;  . PERIPHERAL VASCULAR CATHETERIZATION Right 11/21/2016   Procedure: Lower Extremity Angiography;  Surgeon: Annice Needy, MD;  Location: [facility] INVASIVE CV LAB;  Service: Cardiovascular;  Laterality: Right;  . PERIPHERAL VASCULAR CATHETERIZATION N/A 11/21/2016   Procedure: Abdominal Aortogram w/Lower Extremity;  Surgeon: Annice Needy, MD;  Location: [facility] INVASIVE CV LAB;  Service: Cardiovascular;  Laterality: N/A;  . PERIPHERAL VASCULAR CATHETERIZATION  11/21/2016   Procedure: Lower Extremity Intervention;  Surgeon: Annice Needy, MD;  Location: [facility] INVASIVE CV LAB;  Service: Cardiovascular;;    in for   Chief Complaint  Patient presents with  . Fever  . Wound Check     HPI  [name] [name]  is a 83 y.o. female, Patient has what she thinks is about a 7 or 8 month history of an ulcer on the back of that left heel. She states her daughters been putting some cream on it. I reviewed the chart review it can never find any mention of the heel ulcer. She is not a great historian . She states that she does not  walk much at all. Currently she has some padded bandages on the area. I'm not certain as to what type of treatment she's been getting with this at home.    Review of Systems    In addition to the HPI above,  No Fever-chills, No Headache, No changes with Vision or hearing, No problems swallowing food or Liquids, No Chest pain, Cough or Shortness of Breath, No Abdominal pain, No Nausea or Vommitting, Bowel movements are regular, No Blood in stool or Urine, No dysuria, Large decubitus ulcer left heels for dermatology is concerned. I'll smaller ulcer just on the right ankle. No new joints pains-aches,  No new weakness, tingling, numbness in any extremity, No recent weight gain or loss, No polyuria, polydypsia or polyphagia, No significant Mental Stressors.  A full 10 point Review of Systems was done, except as stated above, all other Review of Systems were negative.   Social History Social History  Substance Use Topics  . Smoking status: Former Smoker    Types: Cigarettes  . Smokeless tobacco: Never Used  . Alcohol use No  Family History Family History  Problem Relation Age of Onset  . Stroke Mother   . Stroke Father   . Cancer Father   . Heart disease Paternal Grandfather   . Diabetes Sister   .  Diabetes Brother      Prior to Admission medications   Medication Sig Start Date End Date Taking? Authorizing Provider  acetaminophen (TYLENOL) 325 MG tablet Take 2 tablets (650 mg total) by mouth every 6 (six) hours as needed for mild pain (temp > 101.5). 11/23/16  Yes Ramonita Lab, MD  albuterol (PROVENTIL HFA;VENTOLIN HFA) 108 (90 Base) MCG/ACT inhaler Inhale 2 puffs into the lungs every 6 (six) hours as needed for wheezing or shortness of breath. 11/23/16  Yes Ramonita Lab, MD  aspirin 81 MG tablet Chew 81 mg by mouth daily.   Yes Historical Provider, MD  atorvastatin (LIPITOR) 10 MG tablet Take 1 tablet by mouth at bedtime.  03/26/16  Yes Historical Provider, MD  carvedilol  (COREG) 3.125 MG tablet Take 1 tablet (3.125 mg total) by mouth 2 (two) times daily with a meal. 09/17/16  Yes Katha Hamming, MD  clopidogrel (PLAVIX) 75 MG tablet Take 1 tablet (75 mg total) by mouth daily. 11/16/16  Yes Enedina Finner, MD  collagenase (SANTYL) ointment Apply topically daily. 11/15/16  Yes Enedina Finner, MD  diphenhydrAMINE (BENADRYL) 25 mg capsule Take 25 mg by mouth every 6 (six) hours as needed for itching.   Yes Historical Provider, MD  fluticasone-salmeterol (ADVAIR HFA) 115-21 MCG/ACT inhaler Inhale 2 puffs into the lungs 2 (two) times daily. 11/23/16  Yes Ramonita Lab, MD  furosemide (LASIX) 20 MG tablet Take 1 tablet (20 mg total) by mouth 2 (two) times daily. 11/23/16  Yes Ramonita Lab, MD  gabapentin (NEURONTIN) 100 MG capsule Take 2 capsules by mouth 2 (two) times daily. Take 2 capsules (200mg ) twice a day 02/23/16  Yes Historical Provider, MD  insulin aspart (NOVOLOG) 100 UNIT/ML injection Inject 0-15 Units into the skin 3 (three) times daily with meals. 11/23/16  Yes Ramonita Lab, MD  insulin glargine (LANTUS) 100 UNIT/ML injection Inject 0.1 mLs (10 Units total) into the skin daily. 12/29/16  Yes Enedina Finner, MD  lisinopril (PRINIVIL,ZESTRIL) 2.5 MG tablet Take 1 tablet (2.5 mg total) by mouth daily. 09/18/16  Yes Katha Hamming, MD  loratadine (CLARITIN) 10 MG tablet Take 10 mg by mouth daily.   Yes Historical Provider, MD  potassium chloride SA (K-DUR,KLOR-CON) 20 MEQ tablet Take 20 mEq by mouth daily.   Yes Historical Provider, MD  Vitamin D, Ergocalciferol, (DRISDOL) 50000 units CAPS capsule Take 1 capsule by mouth once a week. 03/26/16  Yes Historical Provider, MD    Anti-infectives    Start     Dose/Rate Route Frequency Ordered Stop   01/26/17 0600  piperacillin-tazobactam (ZOSYN) IVPB 3.375 g     3.375 g 12.5 mL/hr over 240 Minutes Intravenous Every 8 hours 01/25/17 1518     01/26/17 0200  vancomycin (VANCOCIN) 1,250 mg in sodium chloride 0.9 % 250 mL IVPB     1,250  mg 166.7 mL/hr over 90 Minutes Intravenous Every 36 hours 01/25/17 1518     01/25/17 1345  vancomycin (VANCOCIN) 1,250 mg in sodium chloride 0.9 % 250 mL IVPB     1,250 mg 166.7 mL/hr over 90 Minutes Intravenous  Once 01/25/17 1334 01/25/17 1630   01/25/17 1345  levofloxacin (LEVAQUIN) IVPB 750 mg     750 mg 100 mL/hr over 90 Minutes Intravenous  Once 01/25/17 1334 01/25/17 1630   01/25/17 1300  ceFEPIme (MAXIPIME) 2 GM / 50mL IVPB premix     2 g 100 mL/hr over 30 Minutes Intravenous  Once 01/25/17 1248 01/25/17 1359   01/25/17 1300  vancomycin (  VANCOCIN) IVPB 1000 mg/200 mL premix  Status:  Discontinued     1,000 mg 200 mL/hr over 60 Minutes Intravenous  Once 01/25/17 1248 01/25/17 1340      Scheduled Meds: . [START ON 01/26/2017] aspirin EC  81 mg Oral Daily  . atorvastatin  10 mg Oral QHS  . carvedilol  3.125 mg Oral BID WC  . [START ON 01/26/2017] clopidogrel  75 mg Oral Daily  . enoxaparin (LOVENOX) injection  30 mg Subcutaneous Q24H  . furosemide  20 mg Oral BID  . gabapentin  200 mg Oral BID  . insulin aspart  0-5 Units Subcutaneous QHS  . insulin aspart  0-9 Units Subcutaneous TID WC  . [START ON 01/26/2017] insulin glargine  10 Units Subcutaneous Daily  . [START ON 01/26/2017] lisinopril  2.5 mg Oral Daily  . [START ON 01/26/2017] loratadine  10 mg Oral Daily  . mometasone-formoterol  2 puff Inhalation BID  . [START ON 01/26/2017] piperacillin-tazobactam (ZOSYN)  IV  3.375 g Intravenous Q8H  . [START ON 01/26/2017] potassium chloride SA  20 mEq Oral Daily  . [START ON 01/26/2017] vancomycin  1,250 mg Intravenous Q36H  . Vitamin D (Ergocalciferol)  50,000 Units Oral Weekly  No Known Allergies  Physical Exam: [name] relatively alert and seems to answer questions correctly.  Vitals  Blood pressure 137/65, pulse 71, temperature 98.8 F (37.1 C), temperature source Oral, resp. rate 16, weight 72.8 kg (160 lb 9.6 oz), SpO2 100 %.  Lower Extremity exam:  Vascular: Listen to the DP  arteries bilateral with the hand-held Doppler and they were audible the right foot had more of a elastic biphasic sounds in the left which is more monophasic. Posterior tibial artery on the left I cannot get to because the bandage but x-rays showed arterial calcification in that region.  Dermatological: Patient has a large decubitus ulceration with a large black eschar approximately 8 cm x 9 cm on the posterior aspect of the left heel. The right ankle has a wound proximally 1 cm x 1 cm and does not appear infected or 2 deep at this point. This also appears to be decubitus. I debrided the eschar approximately 75% away from the underlying tissue which shows to be some fatty tissue with some necrosis and drainage from the region which probed down to bone.  Neurological: [name] very neuropathic across the area is likely secondary to diabetic neuropathy. She do not feel that debridement at all.  Ortho: X-ray showed some cortical disruption at the posterior heel adjacent area to the ulceration. An MRI is ordered to confirm possible osteomyelitis to the heel. Chances of this are high based on clinical findings.  Data Review  CBC  Recent Labs Lab 01/25/17 1227  WBC 7.1  HGB 7.6*  HCT 22.8*  PLT 313  MCV 81.7  MCH 27.2  MCHC 33.3  RDW 20.2*  LYMPHSABS 0.9*  MONOABS 0.2  EOSABS 0.1  BASOSABS 0.0   ------------------------------------------------------------------------------------------------------------------  Chemistries   Recent Labs Lab 01/25/17 1227  NA 140  K 4.6  CL 106  CO2 28  GLUCOSE 259*  BUN 44*  CREATININE 1.61*  CALCIUM 8.4*  AST 14*  ALT 9*  ALKPHOS 78  BILITOT 0.5   --------------------------------------------------------------------------------------------------------------------------------------------------------------------------------------------------------------------  Urinalysis    Component Value Date/Time   COLORURINE YELLOW (A) 11/20/2016 2203    APPEARANCEUR HAZY (A) 11/20/2016 2203   APPEARANCEUR Hazy 02/04/2014 1719   LABSPEC 1.018 11/20/2016 2203   LABSPEC 1.005 02/04/2014 1719  PHURINE 5.0 11/20/2016 2203   GLUCOSEU 50 (A) 11/20/2016 2203   GLUCOSEU >=500 02/04/2014 1719   HGBUR NEGATIVE 11/20/2016 2203   BILIRUBINUR NEGATIVE 11/20/2016 2203   KETONESUR NEGATIVE 11/20/2016 2203   PROTEINUR 100 (A) 11/20/2016 2203   NITRITE NEGATIVE 11/20/2016 2203   LEUKOCYTESUR NEGATIVE 11/20/2016 2203   LEUKOCYTESUR Trace 02/04/2014 1719     Imaging results:   Dg Chest 2 View  Result Date: 01/25/2017 CLINICAL DATA:  Fever and possible bilateral foot infection. EXAM: CHEST  2 VIEW COMPARISON:  Single-view of the chest 12/24/2016 and 11/21/2016. FINDINGS: Lung volumes are somewhat low with crowding of the bronchovascular structures. Bilateral interstitial opacities are most compatible with pulmonary edema. Aeration is improved compared to the prior exam. Small bilateral pleural effusions are noted. Heart size is upper normal. Aortic atherosclerosis is seen. IMPRESSION: Interstitial pulmonary edema with small bilateral pleural effusions. Electronically Signed   By: Drusilla Kannerhomas  Dalessio M.D.   On: 01/25/2017 13:23   Dg Foot Complete Left  Result Date: 01/25/2017 CLINICAL DATA:  Necrotic ulcer on the left heel.  Fever. EXAM: LEFT FOOT - COMPLETE 3+ VIEW COMPARISON:  11/20/2016 FINDINGS: Diffuse patchy osteopenia. There is fragmentation of the cortex of the posterior aspect of the calcaneus adjacent to the soft tissue ulceration with gas in the soft tissues. The findings are consistent with osteomyelitis of the posterior aspect of the calcaneus. Extensive small vessel calcifications. Arthritic changes at the first MTP joint and of the IP joint of the great toe. IMPRESSION: Findings consistent with osteomyelitis of the posterior aspect of the calcaneus appear Electronically Signed   By: Francene BoyersJames  Maxwell M.D.   On: 01/25/2017 13:24    Assessment &  Plan: This is a large deep ulceration after the foot was prepped with Betadine and I debrided the wound at bedside remove as much of the eschars are coated bedside. There is underlying fatty tissue that showed poor vascularity and probe down to the posterior calcaneus is consistent with likely osteomyelitis to the area since with compression there was some purulent drainage. This was cultured and was sent to lab. I dressed it with a padded dressing will order protective boots for both heels and ankles. Patient will need further debridement likely partial calcanectomy in order to try to get this healed up. Tissue damage is noted on the posterior heel and this will be difficult to see closure. I explained to the patient that this is a limb and potential life-threatening problem. Patient has a high risk of below-knee amputation associated with this type of wound.  Active Problems:   Acute osteomyelitis of left foot [facility](HCC)     Family Communication: Plan discussed with patient   Epimenio SarinROXLER,Marcello Tuzzolino G M.D on 01/25/2017 at 6:42 PM  Thank you for the consult, we will follow the patient with you in the Hospital.

## 2017-01-25 NOTE — ED Provider Notes (Signed)
[facility] Emergency Department Provider Note  ____________________________________________  Time seen: Approximately 2:28 PM  I have reviewed the triage vital signs and the nursing notes.   HISTORY  Chief Complaint Fever and Wound Check Level 5 caveat:  Portions of the history and physical were unable to be obtained due to the patient's poor historian    HPI [name] [name] is a 83 y.o. female brought to the ED due to generalized weakness and fever at home. She has wounds on her right ankle and left heel which are thought to be infected. She also complains of shortness of breath but no cough.  Patient was hospitalized about a month ago due to COPD exacerbation. She does not use oxygen at home. EMS report that on arrival her room air oxygen saturation was 89%.     Past Medical History:  Diagnosis Date  . Diabetes mellitus without complication (HCC)   . Hypertension   . Stroke [facility])      Patient Active Problem List   Diagnosis Date Noted  . Pressure injury of skin 12/26/2016  . Acute respiratory distress 11/21/2016  . 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 Acute respiratory failure with hypoxia (HCC) 09/15/2016  . Carpal tunnel syndrome 09/01/2016  . Hypertension 09/01/2016  . PVD (peripheral vascular disease) (HCC) 09/01/2016  . Open abdominal wall wound 09/01/2016  . Abscess 08/21/2016  . Type 2 diabetes mellitus with complication (HCC) 05/20/2015  . Visual disturbance 05/20/2015  . H/O: CVA (cerebrovascular accident) 01/21/2015  . Difficulty in walking 08/27/2014  . Diabetes (HCC) 08/20/2014  . Diabetic neuropathy (HCC) 08/20/2014  . Dizzy spells 08/20/2014  . Hand numbness 08/20/2014  . Obesity 08/20/2014     Past Surgical History:  Procedure Laterality Date  . PERIPHERAL VASCULAR CATHETERIZATION N/A 11/14/2016   Procedure: Lower Extremity Angiography;  Surgeon: Annice Needy, MD;  Location: [facility]  INVASIVE CV LAB;  Service: Cardiovascular;  Laterality: N/A;  . PERIPHERAL VASCULAR CATHETERIZATION Right 11/21/2016   Procedure: Lower Extremity Angiography;  Surgeon: Annice Needy, MD;  Location: [facility] INVASIVE CV LAB;  Service: Cardiovascular;  Laterality: Right;  . PERIPHERAL VASCULAR CATHETERIZATION N/A 11/21/2016   Procedure: Abdominal Aortogram w/Lower Extremity;  Surgeon: Annice Needy, MD;  Location: [facility] INVASIVE CV LAB;  Service: Cardiovascular;  Laterality: N/A;  . PERIPHERAL VASCULAR CATHETERIZATION  11/21/2016   Procedure: Lower Extremity Intervention;  Surgeon: Annice Needy, MD;  Location: [facility] INVASIVE CV LAB;  Service: Cardiovascular;;     Prior to Admission medications   Medication Sig Start Date End Date Taking? Authorizing Provider  acetaminophen (TYLENOL) 325 MG tablet Take 2 tablets (650 mg total) by mouth every 6 (six) hours as needed for mild pain (temp > 101.5). Patient not taking: Reported on 01/20/2017 11/23/16   Ramonita Lab, MD  albuterol (PROVENTIL HFA;VENTOLIN HFA) 108 (90 Base) MCG/ACT inhaler Inhale 2 puffs into the lungs every 6 (six) hours as needed for wheezing or shortness of breath. 11/23/16   Ramonita Lab, MD  aspirin 81 MG chewable tablet Chew 81 mg by mouth daily.    Historical Provider, MD  atorvastatin (LIPITOR) 10 MG tablet Take 1 tablet by mouth at bedtime.  03/26/16   Historical Provider, MD  carvedilol (COREG) 3.125 MG tablet Take 1 tablet (3.125 mg total) by mouth 2 (two) times daily with a meal. 09/17/16   Katha Hamming, MD  clopidogrel (PLAVIX) 75 MG tablet Take 1 tablet (75 mg total) by mouth daily.  11/16/16   Enedina Finner, MD  collagenase (SANTYL) ointment Apply topically daily. 11/15/16   Enedina Finner, MD  diphenhydrAMINE (BENADRYL) 25 mg capsule Take 25 mg by mouth every 6 (six) hours as needed for itching.    Historical Provider, MD  fluticasone-salmeterol (ADVAIR HFA) 115-21 MCG/ACT inhaler Inhale 2 puffs into the lungs 2 (two) times daily. 11/23/16    Ramonita Lab, MD  furosemide (LASIX) 20 MG tablet Take 1 tablet (20 mg total) by mouth 2 (two) times daily. 11/23/16   Ramonita Lab, MD  gabapentin (NEURONTIN) 100 MG capsule Take 2 capsules by mouth 2 (two) times daily. Take 2 capsules (200mg ) twice a day 02/23/16   Historical Provider, MD  insulin aspart (NOVOLOG) 100 UNIT/ML injection Inject 0-15 Units into the skin 3 (three) times daily with meals. Patient not taking: Reported on 01/17/2017 11/23/16   Ramonita Lab, MD  insulin glargine (LANTUS) 100 UNIT/ML injection Inject 0.1 mLs (10 Units total) into the skin daily. Patient not taking: Reported on 01/20/2017 12/29/16   Enedina Finner, MD  lisinopril (PRINIVIL,ZESTRIL) 2.5 MG tablet Take 1 tablet (2.5 mg total) by mouth daily. 09/18/16   Katha Hamming, MD  loratadine (CLARITIN) 10 MG tablet Take 10 mg by mouth daily.    Historical Provider, MD  potassium chloride SA (K-DUR,KLOR-CON) 20 MEQ tablet Take 20 mEq by mouth daily.    Historical Provider, MD  Vitamin D, Ergocalciferol, (DRISDOL) 50000 units CAPS capsule Take 1 capsule by mouth once a week. 03/26/16   Historical Provider, MD     Allergies Patient has no known allergies.   Family History  Problem Relation Age of Onset  . Stroke Mother   . Stroke Father   . Heart disease Paternal Grandfather     Social History Social History  Substance Use Topics  . Smoking status: Former Smoker    Types: Cigarettes  . Smokeless tobacco: Never Used  . Alcohol use No    Review of Systems  Constitutional:   Positive fever at home.  ENT:   No sore throat. No rhinorrhea. Cardiovascular:   No chest pain. Respiratory:   Positive shortness of breath without cough. Gastrointestinal:   Negative for abdominal pain, vomiting and diarrhea.  Genitourinary:   Negative for dysuria or difficulty urinating. Musculoskeletal:  Left heel wound, right ankle wound Neurological:   Negative for headaches or focal weakness 10-point ROS otherwise  negative.  ____________________________________________   PHYSICAL EXAM:  VITAL SIGNS: ED Triage Vitals  Enc Vitals Group     BP 01/25/17 1215 (!) 113/58     Pulse Rate 01/25/17 1215 76     Resp 01/25/17 1215 16     Temp 01/25/17 1215 99 F (37.2 C)     Temp Source 01/25/17 1215 Oral     SpO2 01/25/17 1215 92 %     Weight 01/25/17 1333 160 lb 9.6 oz (72.8 kg)     Height --      Head Circumference --      Peak Flow --      Pain Score 01/25/17 1218 10     Pain Loc --      Pain Edu? --      Excl. in GC? --     Vital signs reviewed, nursing assessments reviewed.   Constitutional:   Alert and oriented. Ill-appearing. Eyes:   No scleral icterus. No conjunctival pallor. PERRL. EOMI.  No nystagmus. ENT   Head:   Normocephalic and atraumatic.   Nose:   No  congestion/rhinnorhea. No septal hematoma   Mouth/Throat:   MMM, no pharyngeal erythema. No peritonsillar mass.    Neck:   No stridor. No SubQ emphysema. No meningismus. Hematological/Lymphatic/Immunilogical:   No cervical lymphadenopathy. Cardiovascular:   RRR. Symmetric bilateral radial and DP pulses.  No murmurs.  Respiratory:   Normal respiratory effort without tachypnea nor retractions. Diminished breath sounds on the left side, diffuse crackles. Diffuse expiratory wheezing Gastrointestinal:   Soft and nontender. Non distended. There is no CVA tenderness.  No rebound, rigidity, or guarding. Genitourinary:   deferred Musculoskeletal:   Nontender with normal range of motion in all extremities. No joint effusions. No edema. Right lateral malleolus has a pressure ulcer, stage III, nontender without inflammatory changes. The left calcaneus has a large necrotic eschar, approximately 5-6 cm in diameter. It is retracting from the wound edges with some drainage. There is a foul smell.. Neurologic:   Normal speech and language.  CN 2-10 normal. Motor grossly intact. No gross focal neurologic deficits are appreciated.   Skin:    Skin is warm, dry with wounds as above. No rash noted.  No petechiae, purpura, or bullae.  ____________________________________________    LABS (pertinent positives/negatives) (all labs ordered are listed, but only abnormal results are displayed) Labs Reviewed  COMPREHENSIVE METABOLIC PANEL - Abnormal; Notable for the following:       Result Value   Glucose, Bld 259 (*)    BUN 44 (*)    Creatinine, Ser 1.61 (*)    Calcium 8.4 (*)    Albumin 2.4 (*)    AST 14 (*)    ALT 9 (*)    GFR calc non Af Amer 29 (*)    GFR calc Af Amer 34 (*)    All other components within normal limits  CBC WITH DIFFERENTIAL/PLATELET - Abnormal; Notable for the following:    RBC 2.78 (*)    Hemoglobin 7.6 (*)    HCT 22.8 (*)    RDW 20.2 (*)    Lymphs Abs 0.9 (*)    All other components within normal limits  BLOOD GAS, VENOUS - Abnormal; Notable for the following:    pO2, Ven <31.0 (*)    Bicarbonate 30.4 (*)    Acid-Base Excess 3.4 (*)    All other components within normal limits  CULTURE, BLOOD (ROUTINE X 2)  CULTURE, BLOOD (ROUTINE X 2)  LACTIC ACID, PLASMA  LACTIC ACID, PLASMA   ____________________________________________   EKG    ____________________________________________    RADIOLOGY  Chest x-ray reveals pulmonary edema with bilateral pleural effusions X-ray left foot reveals destructive changes of posterior aspect of the left calcaneus with subcutaneous air, concerning for osteomyelitis.  ____________________________________________   PROCEDURES Procedures  ____________________________________________   INITIAL IMPRESSION / ASSESSMENT AND PLAN / ED COURSE  Pertinent labs & imaging results that were available during my care of the patient were reviewed by me and considered in my medical decision making (see chart for details).  Patient presents with hypoxia fever and foot wounds. Given nebs and Solu-Medrol for COPD exacerbation, along with vancomycin and  Levaquin for possible pneumonia and probable osteomyelitis of the left calcaneus. Case discussed with Dr. Orland Jarred podiatry for evaluation. Patient last ate about 8 AM today. Case discussed with hospitalist for admission at 2:35 PM.  Hemoglobin today is 7.6, 2 points lower than it was a month ago. In November it was 11 and prior to that her baseline was 13. No clear source of bleeding at this time.  Clinical Course as of Jan 25 1427  Wed Jan 25, 2017  1332 +osteo of L calc. Will start vanc/levaquin to cover osteo + possible pneumonia.   [PS]  1401 Rectal exam negative. Unclear cause of downtrending Hemoglobin.  [PS]    Clinical Course User Index [PS] Sharman CheekPhillip Ezekial Arns, MD   ____________________________________________   FINAL CLINICAL IMPRESSION(S) / ED DIAGNOSES  Final diagnoses:  Osteomyelitis of left foot, unspecified type (HCC)  Generalized weakness  Acute pulmonary edema (HCC)  Acute respiratory failure with hypoxia [facility](HCC)      New Prescriptions   No medications on file     Portions of this note were generated with dragon dictation software. Dictation errors may occur despite best attempts at proofreading.    Sharman CheekPhillip Lusia Greis, MD 01/25/17 1438

## 2017-01-26 ENCOUNTER — Inpatient Hospital Stay: Payer: Medicare HMO

## 2017-01-26 DIAGNOSIS — E119 Type 2 diabetes mellitus without complications: Secondary | ICD-10-CM

## 2017-01-26 DIAGNOSIS — M86171 Other acute osteomyelitis, right ankle and foot: Secondary | ICD-10-CM

## 2017-01-26 DIAGNOSIS — R509 Fever, unspecified: Secondary | ICD-10-CM

## 2017-01-26 DIAGNOSIS — I10 Essential (primary) hypertension: Secondary | ICD-10-CM

## 2017-01-26 DIAGNOSIS — L89624 Pressure ulcer of left heel, stage 4: Secondary | ICD-10-CM

## 2017-01-26 LAB — GLUCOSE, CAPILLARY
GLUCOSE-CAPILLARY: 336 mg/dL — AB (ref 65–99)
GLUCOSE-CAPILLARY: 341 mg/dL — AB (ref 65–99)
GLUCOSE-CAPILLARY: 207 mg/dL — AB (ref 65–99)
Glucose-Capillary: 349 mg/dL — ABNORMAL HIGH (ref 65–99)

## 2017-01-26 LAB — BASIC METABOLIC PANEL
ANION GAP: 6 (ref 5–15)
BUN: 47 mg/dL — ABNORMAL HIGH (ref 6–20)
CO2: 27 mmol/L (ref 22–32)
CREATININE: 1.48 mg/dL — AB (ref 0.44–1.00)
Calcium: 8.6 mg/dL — ABNORMAL LOW (ref 8.9–10.3)
Chloride: 103 mmol/L (ref 101–111)
GFR, EST AFRICAN AMERICAN: 37 mL/min — AB (ref >60)
GFR, EST NON AFRICAN AMERICAN: 32 mL/min — AB (ref >60)
GLUCOSE: 355 mg/dL — AB (ref 65–99)
Potassium: 5.1 mmol/L (ref 3.5–5.1)
Sodium: 136 mmol/L (ref 135–145)

## 2017-01-26 LAB — CBC
HCT: 25.1 % — ABNORMAL LOW (ref 35.0–47.0)
Hemoglobin: 8 g/dL — ABNORMAL LOW (ref 12.0–16.0)
MCH: 26.2 pg (ref 26.0–34.0)
MCHC: 31.9 g/dL — ABNORMAL LOW (ref 32.0–36.0)
MCV: 81.9 fL (ref 80.0–100.0)
PLATELETS: 322 10*3/uL (ref 150–440)
RBC: 3.06 MIL/uL — ABNORMAL LOW (ref 3.80–5.20)
RDW: 19.7 % — ABNORMAL HIGH (ref 11.5–14.5)
WBC: 4.8 10*3/uL (ref 3.6–11.0)

## 2017-01-26 MED ORDER — INSULIN GLARGINE 100 UNIT/ML ~~LOC~~ SOLN
15.0000 [IU] | Freq: Every day | SUBCUTANEOUS | Status: DC
  Filled 2017-01-26: qty 0.15

## 2017-01-26 MED ORDER — INSULIN ASPART 100 UNIT/ML ~~LOC~~ SOLN
6.0000 [IU] | Freq: Three times a day (TID) | SUBCUTANEOUS | Status: DC
  Administered 2017-01-26: 6 [IU] via SUBCUTANEOUS
  Filled 2017-01-26: qty 6

## 2017-01-26 NOTE — Clinical Social Work Note (Signed)
Clinical Social Work Assessment  Patient Details  Name: [name] [name] MRN: [id number] Date of Birth: 07/12/1934  Date of referral:  01/26/17               Reason for consult:  Facility Placement                Permission sought to share information with:  Chartered certified accountant granted to share information::  Yes, Verbal Permission Granted  Name::      [place]::   [place]   Relationship::     Contact Information:     Housing/Transportation Living arrangements for the past 2 months:  [place] of Information:  Patient Patient Interpreter Needed:  None Criminal Activity/Legal Involvement Pertinent to Current Situation/Hospitalization:  No - Comment as needed Significant Relationships:  Adult Children Lives with:  Adult Children Do you feel safe going back to the place where you live?  Yes Need for family participation in patient care:  Yes (Comment)  Care giving concerns:  Patient lives in [place] with her daughter [name] [name].    Social Worker assessment / plan:  Holiday representative (CSW) reviewed chart and noted that PT is recommending SNF. CSW met with patient alone at bedside to discuss D/C plan. Patient was oriented X3 and was laying in the bed. Patient reported that she lives in [place] with her daughter. CSW explained SNF process and that her [place] insurance will have to approve SNF. Patient reported that she has been to [facility] before and prefers to go back there if she can. [name] agreeable to SNF search in [facility]. FL2 complete and faxed out. CSW left patient's daughter [name] [name] a Advertising account executive. CSW will continue to follow and assist as needed.   Employment status:  Retired Nurse, adult PT Recommendations:  [place] / Referral to community resources:  [place]  Patient/Family's Response to care:  [name] agreeable to  [facility] in [place].   Patient/Family's Understanding of and Emotional Response to Diagnosis, Current Treatment, and Prognosis:  Patient was very pleasant and thanked CSW for visit.   Emotional Assessment Appearance:  Appears stated age Attitude/Demeanor/Rapport:    Affect (typically observed):  Accepting, Adaptable, Pleasant Orientation:  Oriented to Self, Oriented to Place, Oriented to  Time, Oriented to Situation Alcohol / Substance use:  Not Applicable Psych involvement (Current and /or in the community):  No (Comment)  Discharge Needs  Concerns to be addressed:  Discharge Planning Concerns Readmission within the last 30 days:  No Current discharge risk:  Dependent with Mobility Barriers to Discharge:  Continued Medical Work up   [employer], Veronia Beets, LCSW 01/26/2017, 4:23 PM

## 2017-01-26 NOTE — Progress Notes (Signed)
[facility] - [place] at [facility]   PATIENT NAME: [name] [name]    MR#:  [id number]  DATE OF BIRTH:  1934-06-26  SUBJECTIVE:  CHIEF COMPLAINT:   Chief Complaint  Patient presents with  . Fever  . Wound Check  Feeling much better.  Pain is controlled REVIEW OF SYSTEMS:  Review of Systems  Constitutional: Negative for chills, fever and weight loss.  HENT: Negative for nosebleeds and sore throat.   Eyes: Negative for blurred vision.  Respiratory: Negative for cough, shortness of breath and wheezing.   Cardiovascular: Negative for chest pain, orthopnea, leg swelling and PND.  Gastrointestinal: Negative for abdominal pain, constipation, diarrhea, heartburn, nausea and vomiting.  Genitourinary: Negative for dysuria and urgency.  Musculoskeletal: Positive for joint pain (left foot). Negative for back pain.  Skin: Negative for rash.  Neurological: Negative for dizziness, speech change, focal weakness and headaches.  Endo/Heme/Allergies: Does not bruise/bleed easily.  Psychiatric/Behavioral: Negative for depression.   DRUG ALLERGIES:  No Known Allergies VITALS:  Blood pressure (!) 141/62, pulse 69, temperature 98 F (36.7 C), temperature source Oral, resp. rate 19, weight 72.8 kg (160 lb 9.6 oz), SpO2 100 %. PHYSICAL EXAMINATION:  Physical Exam  Constitutional: She is oriented to person, place, and time and well-developed, well-nourished, and in no distress.  HENT:  Head: Normocephalic and atraumatic.  Eyes: Conjunctivae and EOM are normal. Pupils are equal, round, and reactive to light.  Neck: Normal range of motion. Neck supple. No tracheal deviation present. No thyromegaly present.  Cardiovascular: Normal rate, regular rhythm and normal heart sounds.   Pulmonary/Chest: Effort normal and breath sounds normal. No respiratory distress. She has no wheezes. She exhibits no tenderness.  Abdominal: Soft. Bowel sounds are normal. She exhibits no distension. There  is no tenderness.  Musculoskeletal: Normal range of motion.       Left foot: There is tenderness and bony tenderness.  Left heel necrotic ulcer with foul smelling drainage  Neurological: She is alert and oriented to person, place, and time. No cranial nerve deficit.  Skin: Skin is warm and dry. Lesion and rash noted.  Left heel necrotic ulcer with foul smelling drainage  Psychiatric: Mood and affect normal.   LABORATORY PANEL:   CBC  Recent Labs Lab 01/26/17 0444  WBC 4.8  HGB 8.0*  HCT 25.1*  PLT 322   ------------------------------------------------------------------------------------------------------------------ Chemistries   Recent Labs Lab 01/25/17 1227 01/26/17 0444  NA 140 136  K 4.6 5.1  CL 106 103  CO2 28 27  GLUCOSE 259* 355*  BUN 44* 47*  CREATININE 1.61* 1.48*  CALCIUM 8.4* 8.6*  AST 14*  --   ALT 9*  --   ALKPHOS 78  --   BILITOT 0.5  --    RADIOLOGY:  Dg Chest 2 View  Result Date: 01/25/2017 CLINICAL DATA:  Fever and possible bilateral foot infection. EXAM: CHEST  2 VIEW COMPARISON:  Single-view of the chest 12/24/2016 and 11/21/2016. FINDINGS: Lung volumes are somewhat low with crowding of the bronchovascular structures. Bilateral interstitial opacities are most compatible with pulmonary edema. Aeration is improved compared to the prior exam. Small bilateral pleural effusions are noted. Heart size is upper normal. Aortic atherosclerosis is seen. IMPRESSION: Interstitial pulmonary edema with small bilateral pleural effusions. Electronically Signed   By: Drusilla Kannerhomas  Dalessio M.D.   On: 01/25/2017 13:23   Dg Foot Complete Left  Result Date: 01/25/2017 CLINICAL DATA:  Necrotic ulcer on the left heel.  Fever. EXAM: LEFT FOOT -  COMPLETE 3+ VIEW COMPARISON:  11/20/2016 FINDINGS: Diffuse patchy osteopenia. There is fragmentation of the cortex of the posterior aspect of the calcaneus adjacent to the soft tissue ulceration with gas in the soft tissues. The findings are  consistent with osteomyelitis of the posterior aspect of the calcaneus. Extensive small vessel calcifications. Arthritic changes at the first MTP joint and of the IP joint of the great toe. IMPRESSION: Findings consistent with osteomyelitis of the posterior aspect of the calcaneus appear Electronically Signed   By: Francene Boyers M.D.   On: 01/25/2017 13:24   ASSESSMENT AND PLAN:  83 year old female with past medical history of CHF chronic diastolic, hypertension, diabetes, seizure previous CVA who presents to the hospital due to her subjective fevers and left foot ulcer.  1. Left Foot diabetic ulcer/osteomyelitis- appreciate podiatry input.  Patient may need further debridement and likely partial calcanectomy - Continued intravenous vancomycin, Zosyn. -We'll get infectious disease and vascular surgery consult. - Await wound culture sensitivity  2. Diabetes type 2 without complication-continue Lantus, sliding scale insulin. - Diabetic nurse coordinator c/s  3. CHF-chronic diastolic CHF. -Clinically patient is not in congestive heart failure. Continue Lasix, carvedilol, lisinopril.  4. Hyperlipidemia-continue atorvastatin.  5. COPD-no acute exacerbation-continue Dulera, albuterol inhaler as needed.  6. Diabetic neuropathy-continue gabapentin.    All the records are reviewed and case discussed with Care Management/Social Worker. Management plans discussed with the patient, family and they are in agreement.  CODE STATUS: Full code  TOTAL TIME TAKING CARE OF THIS PATIENT: 35 minutes.   More than 50% of the time was spent in counseling/coordination of care: YES  POSSIBLE D/C IN 2-3 DAYS, DEPENDING ON CLINICAL CONDITION.  And podiatry, vascular surgery and infectious disease workup   Delfino Lovett M.D on 01/26/2017 at 7:26 AM  Between 7am to 6pm - Pager - [phone]  After 6pm go to [email or web address] - Social research officer, government  [facility]  Office   [phone]  CC: Primary care physician; Barbette Reichmann, MD  Note: This dictation was prepared with Dragon dictation along with smaller phrase technology. Any transcriptional errors that result from this process are unintentional.

## 2017-01-26 NOTE — Care Management (Signed)
Patient open to [facility] for SN, PT, OT, HHA and SW. CM following progression.

## 2017-01-26 NOTE — Progress Notes (Signed)
Patient Demographics  [name] [name], is a 83 y.o. female   MRN: [id number]   DOB - March 22, 1934  Admit Date - 01/25/2017    Outpatient Primary MD for the patient is Barbette Reichmann, MD  Consult requested in the Hospital by Delfino Lovett, MD, On 01/26/2017   With History of -  Past Medical History:  Diagnosis Date  . Diabetes mellitus without complication (HCC)   . Hypertension   . Stroke [facility])       Past Surgical History:  Procedure Laterality Date  . PERIPHERAL VASCULAR CATHETERIZATION N/A 11/14/2016   Procedure: Lower Extremity Angiography;  Surgeon: Annice Needy, MD;  Location: [facility] INVASIVE CV LAB;  Service: Cardiovascular;  Laterality: N/A;  . PERIPHERAL VASCULAR CATHETERIZATION Right 11/21/2016   Procedure: Lower Extremity Angiography;  Surgeon: Annice Needy, MD;  Location: [facility] INVASIVE CV LAB;  Service: Cardiovascular;  Laterality: Right;  . PERIPHERAL VASCULAR CATHETERIZATION N/A 11/21/2016   Procedure: Abdominal Aortogram w/Lower Extremity;  Surgeon: Annice Needy, MD;  Location: [facility] INVASIVE CV LAB;  Service: Cardiovascular;  Laterality: N/A;  . PERIPHERAL VASCULAR CATHETERIZATION  11/21/2016   Procedure: Lower Extremity Intervention;  Surgeon: Annice Needy, MD;  Location: [facility] INVASIVE CV LAB;  Service: Cardiovascular;;    in for   Chief Complaint  Patient presents with  . Fever  . Wound Check     HPI  [name] [name]  is a 83 y.o. female, Patient was admitted for severe decubitus ulceration posterior left heel. Patient states that this ulcer is been there for 6 months or more. Uncertain as to exactly how is taken care of. I debrided the ulceration at bedside yesterday this penetrated down to bone and showed purulent drainage from the region with stage IV full-thickness to the bone.    Social  History Social History  Substance Use Topics  . Smoking status: Former Smoker    Types: Cigarettes  . Smokeless tobacco: Never Used  . Alcohol use No     Family History Family History  Problem Relation Age of Onset  . Stroke Mother   . Stroke Father   . Cancer Father   . Heart disease Paternal Grandfather   . Diabetes Sister   . Diabetes Brother     Prior to Admission medications   Medication Sig Start Date End Date Taking? Authorizing Provider  acetaminophen (TYLENOL) 325 MG tablet Take 2 tablets (650 mg total) by mouth every 6 (six) hours as needed for mild pain (temp > 101.5). 11/23/16  Yes Ramonita Lab, MD  albuterol (PROVENTIL HFA;VENTOLIN HFA) 108 (90 Base) MCG/ACT inhaler Inhale 2 puffs into the lungs every 6 (six) hours as needed for wheezing or shortness of breath. 11/23/16  Yes Ramonita Lab, MD  aspirin 81 MG tablet Chew 81 mg by mouth daily.   Yes Historical Provider, MD  atorvastatin (LIPITOR) 10 MG tablet Take 1 tablet by mouth at bedtime.  03/26/16  Yes Historical Provider, MD  carvedilol (COREG) 3.125 MG tablet Take 1 tablet (3.125 mg total) by mouth 2 (two) times daily with a meal. 09/17/16  Yes Katha Hamming, MD  clopidogrel (PLAVIX) 75 MG tablet Take 1 tablet (75 mg total) by mouth  daily. 11/16/16  Yes Enedina FinnerSona Patel, MD  collagenase (SANTYL) ointment Apply topically daily. 11/15/16  Yes Enedina FinnerSona Patel, MD  diphenhydrAMINE (BENADRYL) 25 mg capsule Take 25 mg by mouth every 6 (six) hours as needed for itching.   Yes Historical Provider, MD  fluticasone-salmeterol (ADVAIR HFA) 115-21 MCG/ACT inhaler Inhale 2 puffs into the lungs 2 (two) times daily. 11/23/16  Yes Ramonita LabAruna Gouru, MD  furosemide (LASIX) 20 MG tablet Take 1 tablet (20 mg total) by mouth 2 (two) times daily. 11/23/16  Yes Ramonita LabAruna Gouru, MD  gabapentin (NEURONTIN) 100 MG capsule Take 2 capsules by mouth 2 (two) times daily. Take 2 capsules (200mg ) twice a day 02/23/16  Yes Historical Provider, MD  insulin aspart (NOVOLOG)  100 UNIT/ML injection Inject 0-15 Units into the skin 3 (three) times daily with meals. 11/23/16  Yes Ramonita LabAruna Gouru, MD  insulin glargine (LANTUS) 100 UNIT/ML injection Inject 0.1 mLs (10 Units total) into the skin daily. 12/29/16  Yes Enedina FinnerSona Patel, MD  lisinopril (PRINIVIL,ZESTRIL) 2.5 MG tablet Take 1 tablet (2.5 mg total) by mouth daily. 09/18/16  Yes Katha HammingSnehalatha Konidena, MD  loratadine (CLARITIN) 10 MG tablet Take 10 mg by mouth daily.   Yes Historical Provider, MD  potassium chloride SA (K-DUR,KLOR-CON) 20 MEQ tablet Take 20 mEq by mouth daily.   Yes Historical Provider, MD  Vitamin D, Ergocalciferol, (DRISDOL) 50000 units CAPS capsule Take 1 capsule by mouth once a week. 03/26/16  Yes Historical Provider, MD    Anti-infectives    Start     Dose/Rate Route Frequency Ordered Stop   01/26/17 0600  piperacillin-tazobactam (ZOSYN) IVPB 3.375 g     3.375 g 12.5 mL/hr over 240 Minutes Intravenous Every 8 hours 01/25/17 1518     01/26/17 0200  vancomycin (VANCOCIN) 1,250 mg in sodium chloride 0.9 % 250 mL IVPB     1,250 mg 166.7 mL/hr over 90 Minutes Intravenous Every 36 hours 01/25/17 1518     01/25/17 1345  vancomycin (VANCOCIN) 1,250 mg in sodium chloride 0.9 % 250 mL IVPB     1,250 mg 166.7 mL/hr over 90 Minutes Intravenous  Once 01/25/17 1334 01/25/17 1630   01/25/17 1345  levofloxacin (LEVAQUIN) IVPB 750 mg     750 mg 100 mL/hr over 90 Minutes Intravenous  Once 01/25/17 1334 01/25/17 1630   01/25/17 1300  ceFEPIme (MAXIPIME) 2 GM / 50mL IVPB premix     2 g 100 mL/hr over 30 Minutes Intravenous  Once 01/25/17 1248 01/25/17 1359   01/25/17 1300  vancomycin (VANCOCIN) IVPB 1000 mg/200 mL premix  Status:  Discontinued     1,000 mg 200 mL/hr over 60 Minutes Intravenous  Once 01/25/17 1248 01/25/17 1340      Scheduled Meds: . aspirin EC  81 mg Oral Daily  . atorvastatin  10 mg Oral QHS  . carvedilol  3.125 mg Oral BID WC  . clopidogrel  75 mg Oral Daily  . enoxaparin (LOVENOX) injection  30  mg Subcutaneous Q24H  . furosemide  20 mg Oral BID  . gabapentin  200 mg Oral BID  . insulin aspart  0-5 Units Subcutaneous QHS  . insulin aspart  0-9 Units Subcutaneous TID WC  . insulin aspart  6 Units Subcutaneous TID WC  . [START ON 01/27/2017] insulin glargine  15 Units Subcutaneous Daily  . lisinopril  2.5 mg Oral Daily  . loratadine  10 mg Oral Daily  . mometasone-formoterol  2 puff Inhalation BID  . piperacillin-tazobactam (ZOSYN)  IV  3.375 g Intravenous Q8H  . vancomycin  1,250 mg Intravenous Q36H  . Vitamin D (Ergocalciferol)  50,000 Units Oral Weekly  No Known Allergies  Physical Exam  Vitals  Blood pressure (!) 126/55, pulse 73, temperature 98 F (36.7 C), temperature source Oral, resp. rate 12, weight 72.8 kg (160 lb 9.6 oz), SpO2 96 %.  Lower Extremity exam:Exam of the posterior left heel shows severe tissue damage with a very large ulceration approximately 7.8 cm in length and 7 mm in width. This is full-thickness penetrates to the bone with necrotic fatty tissue and drainage from the bony area on the posterior heel. MRI done earlier today shows increased signal uptake throughout the majority of the calcaneus. There is cortical damage and erosion on x-ray as well as MRI. This is significant for likely osteomyelitis to the area.  Data Review  CBC  Recent Labs Lab 01/25/17 1227 01/26/17 0444  WBC 7.1 4.8  HGB 7.6* 8.0*  HCT 22.8* 25.1*  PLT 313 322  MCV 81.7 81.9  MCH 27.2 26.2  MCHC 33.3 31.9*  RDW 20.2* 19.7*  LYMPHSABS 0.9*  --   MONOABS 0.2  --   EOSABS 0.1  --   BASOSABS 0.0  --    ------------------------------------------------------------------------------------------------------------------  Chemistries   Recent Labs Lab 01/25/17 1227 01/26/17 0444  NA 140 136  K 4.6 5.1  CL 106 103  CO2 28 27  GLUCOSE 259* 355*  BUN 44* 47*  CREATININE 1.61* 1.48*  CALCIUM 8.4* 8.6*  AST 14*  --   ALT 9*  --   ALKPHOS 78  --   BILITOT 0.5  --     ------------------------------------------------------------------------------------------------------------------   ---------------------------------------------------------------------------------------------------------------  Urinalysis    Component Value Date/Time   COLORURINE YELLOW (A) 11/20/2016 2203   APPEARANCEUR HAZY (A) 11/20/2016 2203   APPEARANCEUR Hazy 02/04/2014 1719   LABSPEC 1.018 11/20/2016 2203   LABSPEC 1.005 02/04/2014 1719   PHURINE 5.0 11/20/2016 2203   GLUCOSEU 50 (A) 11/20/2016 2203   GLUCOSEU >=500 02/04/2014 1719   HGBUR NEGATIVE 11/20/2016 2203   BILIRUBINUR NEGATIVE 11/20/2016 2203   KETONESUR NEGATIVE 11/20/2016 2203   PROTEINUR 100 (A) 11/20/2016 2203   NITRITE NEGATIVE 11/20/2016 2203   LEUKOCYTESUR NEGATIVE 11/20/2016 2203   LEUKOCYTESUR Trace 02/04/2014 1719     Imaging results:   Dg Chest 2 View  Result Date: 01/25/2017 CLINICAL DATA:  Fever and possible bilateral foot infection. EXAM: CHEST  2 VIEW COMPARISON:  Single-view of the chest 12/24/2016 and 11/21/2016. FINDINGS: Lung volumes are somewhat low with crowding of the bronchovascular structures. Bilateral interstitial opacities are most compatible with pulmonary edema. Aeration is improved compared to the prior exam. Small bilateral pleural effusions are noted. Heart size is upper normal. Aortic atherosclerosis is seen. IMPRESSION: Interstitial pulmonary edema with small bilateral pleural effusions. Electronically Signed   By: Drusilla Kanner M.D.   On: 01/25/2017 13:23   Mr Foot Left Wo Contrast  Result Date: 01/26/2017 CLINICAL DATA:  Heel ulcer, 8 months. EXAM: MRI OF THE LEFT FOOT WITHOUT CONTRAST TECHNIQUE: Multiplanar, multisequence MR imaging of the ankle was performed. No intravenous contrast was administered. COMPARISON:  01/25/2017 FINDINGS: TENDONS Peroneal: Unremarkable Posteromedial: Mild flexor hallucis longus tenosynovitis proximal to the knot of Henry. Anterior:  Unremarkable Achilles: Mild distal tendinopathy. Low-level edema in the pre Achilles bursa. Plantar Fascia: Thickened medial band with adjacent edematous spur and surrounding edema. LIGAMENTS Lateral: Unremarkable Medial: Thickened superomedial portion of the spring ligament. CARTILAGE Ankle Joint: Moderate degenerative chondral  thinning. Subtalar Joints/Sinus Tarsi: Prominent degenerative chondral thinning. Bones: Chronic osteomyelitis posteriorly and inferiorly in the calcaneus, as on image 12/8. Severe overlying ulceration down to the periosteal level with adjacent gas in the soft tissues. No drainable soft tissue abscess. Other: Edema in the plantar musculature of the foot, myositis is a possibility. Edema in the distal flexor hallucis longus muscle. IMPRESSION: 1. Chronic osteomyelitis posteriorly and inferiorly in the calcaneus with severe overlying ulceration down to the periosteal margin. Adjacent gas in the soft tissues. No obvious drainable pause collection in the soft tissues. 2. Potential myositis in the distal flexor hallucis longus muscle and along the plantar musculature of the foot. Mild flexor hallucis longus tenosynovitis. 3. Mild distal Achilles tendinopathy. 4. Plantar fasciitis. 5. Thickened superomedial portion of the spring ligament. 6. Degenerative chondral thinning in the tibiotalar joint and subtalar joint. Electronically Signed   By: Gaylyn Rong M.D.   On: 01/26/2017 11:42   Dg Foot Complete Left  Result Date: 01/25/2017 CLINICAL DATA:  Necrotic ulcer on the left heel.  Fever. EXAM: LEFT FOOT - COMPLETE 3+ VIEW COMPARISON:  11/20/2016 FINDINGS: Diffuse patchy osteopenia. There is fragmentation of the cortex of the posterior aspect of the calcaneus adjacent to the soft tissue ulceration with gas in the soft tissues. The findings are consistent with osteomyelitis of the posterior aspect of the calcaneus. Extensive small vessel calcifications. Arthritic changes at the first MTP  joint and of the IP joint of the great toe. IMPRESSION: Findings consistent with osteomyelitis of the posterior aspect of the calcaneus appear Electronically Signed   By: Francene Boyers M.D.   On: 01/25/2017 13:24     Assessment & Plan: The severity of the patient's wound and infection throughout most of the heel bone and the severe loss tissue suggest a poor prognosis for salvage of this left foot. This coupled with the fact that according to the patient she hasn't walked much at all for the last year with indicate a likelihood of better results with a below-knee amputation versus attempts to try and resolve this calcaneal osteomyelitis with severe tissue loss to the posterior heel. Plan: I have consulted vascular in order to evaluate this and also render a second opinion regarding the need for BKA. I made an attempt to try and call her family to discuss this with her. I was unsuccessful the left the message for the daughter to call the Hospital. I will follow her tomorrow.  Active Problems:   Acute osteomyelitis of left foot (HCC)   Pressure ulcer of left heel, stage 4 (HCC)  Family Communication: Plan discussed with patient.   Epimenio Sarin M.D on 01/26/2017 at 5:15 PM

## 2017-01-26 NOTE — Evaluation (Signed)
Physical Therapy Evaluation Patient Details Name: [name] [name] MRN: [id number] DOB: Mar 01, 1934 Today's Date: 01/26/2017   History of Present Illness  83 year old female with past medical history of CHF chronic diastolic, hypertension, diabetes, seizure previous CVA who presents to the hospital due to her subjective fevers and left foot ulcer. L foot MRI showed chronic osteomyelitis posteriorly and inferiorly in the calcaneus. Unclear at this time if pt will require surgical management. Pt will be maintained NWB on LLE during PT evaluation. Pt is AOx1 at time of evaluation so unclear accuracy of history  Clinical Impression  Pt admitted with above diagnosis. Pt currently with functional limitations due to the deficits listed below (see PT Problem List).  Pt is very weak and requires maxA+1 for bed mobility. She is dependent for transfers and is unable to stand at this time. She is unable to ambulate as well. Pt maintained NWB on LLE until POC determined regarding intervention for osteomyelitis. She will need SNF placement at discharge and may need higher level of baseline care following rehab. Pt will benefit from skilled PT services to address deficits in strength, balance, and mobility in order to return to full function at home.     Follow Up Recommendations SNF    Equipment Recommendations  None recommended by PT    Recommendations for Other Services       Precautions / Restrictions Precautions Precautions: Fall Restrictions Weight Bearing Restrictions: Yes LLE Weight Bearing: Non weight bearing Other Position/Activity Restrictions: Maintained NWB on LLE due to osteomyelitis. No WB status from MD. Still awaiting plan of care s/p MRI      Mobility  Bed Mobility Overal bed mobility: Needs Assistance Bed Mobility: Supine to Sit     Supine to sit: Max assist     General bed mobility comments: Pt very weak during bed mobility and requires heavy cues and maxA+1 for  sequencing  Transfers Overall transfer level: Needs assistance Equipment used: Rolling walker (2 wheeled) Transfers: Sit to/from Stand Sit to Stand: Total assist         General transfer comment: Attempted sit to stand but pt unable to assist with RLE. Maintained NWB on LLE. Pt requires total assist +2 to transfer from bed to recliner.  Ambulation/Gait             General Gait Details: Unable to transfer or ambulate at this time  Stairs            Wheelchair Mobility    Modified Rankin (Stroke Patients Only)       Balance Overall balance assessment: Needs assistance Sitting-balance support: No upper extremity supported Sitting balance-Leahy Scale: Fair     Standing balance support: Bilateral upper extremity supported Standing balance-Leahy Scale: Zero                               Pertinent Vitals/Pain Pain Assessment: No/denies pain    Home Living Family/patient expects to be discharged to:: Private residence Living Arrangements: Children Available Help at Discharge: Family Type of Home: House Home Access: Stairs to enter Entrance Stairs-Rails: Right Entrance Stairs-Number of Steps: 4 Home Layout: One level Home Equipment: Wheelchair - [employer]manual;Walker - 2 wheels;Bedside commode;Other (comment);Tub bench;Grab bars - tub/shower      Prior Function Level of Independence: Needs assistance   Gait / Transfers Assistance Needed: Pt reports that she doesn't walk much at home but uses a wheelchair mostly. She does perform transfers with assist. Reports  multiple falls in the last 12 months but cannot recall how many  ADL's / Homemaking Assistance Needed: Requires assist for ADLs and IADLs from family.        Hand Dominance   Dominant Hand: Right    Extremity/Trunk Assessment   Upper Extremity Assessment Upper Extremity Assessment: Generalized weakness    Lower Extremity Assessment Lower Extremity Assessment: Generalized weakness        Communication   Communication: No difficulties  Cognition Arousal/Alertness: Awake/alert Behavior During Therapy: WFL for tasks assessed/performed Overall Cognitive Status: No family/caregiver present to determine baseline cognitive functioning Area of Impairment: Orientation Orientation Level: Disoriented to;Time;Place;Situation             General Comments: Pt with confusion throughout evaluation. Unclear how much of history of clear    General Comments      Exercises     Assessment/Plan    PT Assessment Patient needs continued PT services  PT Problem List Decreased strength;Decreased activity tolerance;Decreased range of motion;Decreased balance;Decreased mobility;Decreased cognition;Decreased knowledge of use of DME;Decreased knowledge of precautions;Decreased safety awareness;Impaired sensation;Decreased skin integrity          PT Treatment Interventions DME instruction;Gait training;Stair training;Functional mobility training;Therapeutic activities;Therapeutic exercise;Neuromuscular re-education;Balance training;Patient/family education;Cognitive remediation;Manual techniques    PT Goals (Current goals can be found in the Care Plan section)  Acute Rehab PT Goals Patient Stated Goal: Improve her strength and mobility PT Goal Formulation: With patient Time For Goal Achievement: 02/09/17 Potential to Achieve Goals: Good    Frequency Min 2X/week   Barriers to discharge Decreased caregiver support At this time pt is total assist for transfers. Would need mechanical lift if returning home. Pt reports that her daughter is currently medically unwell    Co-evaluation               End of Session Equipment Utilized During Treatment: Gait belt Activity Tolerance: Patient tolerated treatment well Patient left: in chair;with call bell/phone within reach;with chair alarm set;Other (comment) (Call bell in lap) Nurse Communication: Mobility status          Time: 1355-1418 PT Time Calculation (min) (ACUTE ONLY): 23 min   Charges:   PT Evaluation $PT Eval Moderate Complexity: 1 Procedure     PT G Codes:       Sharalyn Ink Jerzie Bieri PT, DPT   Promyse Ardito 01/26/2017, 3:56 PM

## 2017-01-26 NOTE — Consult Note (Signed)
[facility] Vascular Consult Note  MRN : [id number]  [name] [name] is a 83 y.o. (1934-07-31) female who presents with chief complaint of  Chief Complaint  Patient presents with  . Fever  . Wound Check  .  History of Present Illness: I am asked to see the patient by Dr. Sherryll Burger for evaluation of a nonhealing ulcer and osteomyelitis on the left heel. She has a previous history of revascularization to both lower extremities for nonhealing ulcerations 2-3 months ago. She has not been back to our office for follow-up after these interventions. She presents today with fever and redness in her foot and leg. An MRI performed today shows extensive osteomyelitis in the left heel. The patient cannot really provide much in the way of history and is pleasantly confused. She says her foot is really not hurting much today. By report, she is not ambulatory.  Current Facility-Administered Medications  Medication Dose Route Frequency Provider Last Rate Last Dose  . acetaminophen (TYLENOL) tablet 650 mg  650 mg Oral Q6H PRN Houston Siren, MD       Or  . acetaminophen (TYLENOL) suppository 650 mg  650 mg Rectal Q6H PRN Houston Siren, MD      . albuterol (PROVENTIL) (2.5 MG/3ML) 0.083% nebulizer solution 2.5 mg  2.5 mg Nebulization Q6H PRN Delsa Bern, RPH      . aspirin EC tablet 81 mg  81 mg Oral Daily Houston Siren, MD   81 mg at 01/26/17 0858  . atorvastatin (LIPITOR) tablet 10 mg  10 mg Oral QHS Houston Siren, MD   10 mg at 01/25/17 2056  . carvedilol (COREG) tablet 3.125 mg  3.125 mg Oral BID WC Houston Siren, MD   3.125 mg at 01/26/17 1654  . clopidogrel (PLAVIX) tablet 75 mg  75 mg Oral Daily Houston Siren, MD   75 mg at 01/26/17 0857  . enoxaparin (LOVENOX) injection 30 mg  30 mg Subcutaneous Q24H Houston Siren, MD   30 mg at 01/25/17 2055  . furosemide (LASIX) tablet 20 mg  20 mg Oral BID Houston Siren, MD   20 mg at 01/26/17 1654  . gabapentin (NEURONTIN)  capsule 200 mg  200 mg Oral BID Houston Siren, MD   200 mg at 01/26/17 0857  . insulin aspart (novoLOG) injection 0-5 Units  0-5 Units Subcutaneous QHS Houston Siren, MD   4 Units at 01/25/17 2123  . insulin aspart (novoLOG) injection 0-9 Units  0-9 Units Subcutaneous TID WC Houston Siren, MD   7 Units at 01/26/17 1652  . insulin aspart (novoLOG) injection 6 Units  6 Units Subcutaneous TID WC Delfino Lovett, MD   6 Units at 01/26/17 1653  . [START ON 01/27/2017] insulin glargine (LANTUS) injection 15 Units  15 Units Subcutaneous Daily Vipul Shah, MD      . lisinopril (PRINIVIL,ZESTRIL) tablet 2.5 mg  2.5 mg Oral Daily Houston Siren, MD   2.5 mg at 01/26/17 0857  . loratadine (CLARITIN) tablet 10 mg  10 mg Oral Daily Houston Siren, MD   10 mg at 01/26/17 0858  . mometasone-formoterol (DULERA) 200-5 MCG/ACT inhaler 2 puff  2 puff Inhalation BID Houston Siren, MD   2 puff at 01/26/17 0711  . ondansetron (ZOFRAN) tablet 4 mg  4 mg Oral Q6H PRN Houston Siren, MD       Or  . ondansetron [facility]) injection 4 mg  4 mg Intravenous Q6H PRN Houston Siren, MD      . piperacillin-tazobactam (ZOSYN) IVPB 3.375 g  3.375 g Intravenous Q8H Sheema M Hallaji, RPH   3.375 g at 01/26/17 1431  . vancomycin (VANCOCIN) 1,250 mg in sodium chloride 0.9 % 250 mL IVPB  1,250 mg Intravenous Q36H Sheema M Hallaji, RPH   1,250 mg at 01/26/17 0332  . Vitamin D (Ergocalciferol) (DRISDOL) capsule 50,000 Units  50,000 Units Oral Weekly Houston Siren, MD   50,000 Units at 01/25/17 2055    Past Medical History:  Diagnosis Date  . Diabetes mellitus without complication (HCC)   . Hypertension   . Stroke [facility])     Past Surgical History:  Procedure Laterality Date  . PERIPHERAL VASCULAR CATHETERIZATION N/A 11/14/2016   Procedure: Lower Extremity Angiography;  Surgeon: Annice Needy, MD;  Location: [facility] INVASIVE CV LAB;  Service: Cardiovascular;  Laterality: N/A;  . PERIPHERAL VASCULAR CATHETERIZATION Right 11/21/2016    Procedure: Lower Extremity Angiography;  Surgeon: Annice Needy, MD;  Location: [facility] INVASIVE CV LAB;  Service: Cardiovascular;  Laterality: Right;  . PERIPHERAL VASCULAR CATHETERIZATION N/A 11/21/2016   Procedure: Abdominal Aortogram w/Lower Extremity;  Surgeon: Annice Needy, MD;  Location: [facility] INVASIVE CV LAB;  Service: Cardiovascular;  Laterality: N/A;  . PERIPHERAL VASCULAR CATHETERIZATION  11/21/2016   Procedure: Lower Extremity Intervention;  Surgeon: Annice Needy, MD;  Location: [facility] INVASIVE CV LAB;  Service: Cardiovascular;;    Social History Social History  Substance Use Topics  . Smoking status: Former Smoker    Types: Cigarettes  . Smokeless tobacco: Never Used  . Alcohol use No  No IV drug use. Lives in a facility.  Family History Family History  Problem Relation Age of Onset  . Stroke Mother   . Stroke Father   . Cancer Father   . Heart disease Paternal Grandfather   . Diabetes Sister   . Diabetes Brother     No Known Allergies   REVIEW OF SYSTEMS (Negative unless checked)  Constitutional: [] Weight loss  [x] Fever  [x] Chills Cardiac: [] Chest pain   [] Chest pressure   [] Palpitations   [] Shortness of breath when laying flat   [] Shortness of breath at rest   [] Shortness of breath with exertion. Vascular:  [] Pain in legs with walking   [] Pain in legs at rest   [] Pain in legs when laying flat   [] Claudication   [] Pain in feet when walking  [x] Pain in feet at rest  [] Pain in feet when laying flat   [] History of DVT   [] Phlebitis   [x] Swelling in legs   [] Varicose veins   [x] Non-healing ulcers Pulmonary:   [] Uses home oxygen   [] Productive cough   [] Hemoptysis   [] Wheeze  [] COPD   [] Asthma Neurologic:  [] Dizziness  [] Blackouts   [] Seizures   [x] History of stroke   [] History of TIA  [] Aphasia   [] Temporary blindness   [] Dysphagia   [] Weakness or numbness in arms   [] Weakness or numbness in legs Musculoskeletal:  [] Arthritis   [] Joint swelling   [] Joint pain   [] Low back  pain Hematologic:  [] Easy bruising  [] Easy bleeding   [] Hypercoagulable state   [] Anemic  [] Hepatitis Gastrointestinal:  [] Blood in stool   [] Vomiting blood  [] Gastroesophageal reflux/heartburn   [] Difficulty swallowing. Genitourinary:  [] Chronic kidney disease   [] Difficult urination  [] Frequent urination  [] Burning with urination   [] Blood in urine Skin:  [] Rashes   [x] Ulcers   [x] Wounds Psychological:  [] History of  anxiety   []  History of major depression.  Physical Examination  Vitals:   01/25/17 2003 01/26/17 0359 01/26/17 0749 01/26/17 1540  BP: 139/61 (!) 141/62 (!) 138/56 (!) 126/55  Pulse: 75 69 74 73  Resp: 19 19 16 12   Temp: 98.6 F (37 C) 98 F (36.7 C) 97.7 F (36.5 C) 98 F (36.7 C)  TempSrc:  Oral Oral Oral  SpO2: 97% 100% 97% 96%  Weight:       Body mass index is 28.45 kg/m. Gen:  WD/WN, NAD. Pleasantly confused female sitting in bed Head: [place]/AT, No temporalis wasting. Prominent temp pulse not noted. Ear/Nose/Throat: Hearing grossly intact, nares w/o erythema or drainage, oropharynx w/o Erythema/Exudate Eyes: Sclera non-icteric, conjunctiva clear Neck: Trachea midline.  No JVD.  Pulmonary:  Good air movement, respirations not labored, equal bilaterally.  Cardiac: RRR, normal S1, S2. Vascular:  Vessel Right Left  Radial Palpable Palpable  Ulnar Palpable Palpable  Brachial Palpable Palpable  Carotid Palpable, without bruit Palpable, without bruit  Aorta Not palpable N/A  Femoral Palpable Palpable  Popliteal 1+ Palpable 1+ Palpable  PT 1+ Palpable 1+ Palpable  DP Trace Palpable Not Palpable   Gastrointestinal: soft, non-tender/non-distended. No guarding/reflex.  Musculoskeletal: M/S 5/5 throughout.   No deformity or atrophy. 1+ bilateral lower extremity edema. Left heel wound with mild erythema and drainage. Neurologic: Sensation grossly intact in extremities.  Symmetrical.  Speech is fluent. Motor exam as listed above. Psychiatric: Insight and judgment are  poor. [name] pleasantly confused and is a poor historian. Dermatologic: Open wound on the left heel as above Lymph : No Cervical, Axillary, or Inguinal lymphadenopathy.      CBC Lab Results  Component Value Date   WBC 4.8 01/26/2017   HGB 8.0 (L) 01/26/2017   HCT 25.1 (L) 01/26/2017   MCV 81.9 01/26/2017   PLT 322 01/26/2017    BMET    Component Value Date/Time   NA 136 01/26/2017 0444   NA 141 02/04/2014 1636   K 5.1 01/26/2017 0444   K 4.3 02/04/2014 1636   CL 103 01/26/2017 0444   CL 108 (H) 02/04/2014 1636   CO2 27 01/26/2017 0444   CO2 29 02/04/2014 1636   GLUCOSE 355 (H) 01/26/2017 0444   GLUCOSE 225 (H) 02/04/2014 1636   BUN 47 (H) 01/26/2017 0444   BUN 12 02/04/2014 1636   CREATININE 1.48 (H) 01/26/2017 0444   CREATININE 1.06 02/04/2014 1636   CALCIUM 8.6 (L) 01/26/2017 0444   CALCIUM 9.3 02/04/2014 1636   GFRNONAA 32 (L) 01/26/2017 0444   GFRNONAA 51 (L) 02/04/2014 1636   GFRAA 37 (L) 01/26/2017 0444   GFRAA 59 (L) 02/04/2014 1636   Estimated Creatinine Clearance: 29 mL/min (by C-G formula based on SCr of 1.48 mg/dL (H)).  COAG Lab Results  Component Value Date   INR 1.08 11/20/2016    Radiology Dg Chest 2 View  Result Date: 01/25/2017 CLINICAL DATA:  Fever and possible bilateral foot infection. EXAM: CHEST  2 VIEW COMPARISON:  Single-view of the chest 12/24/2016 and 11/21/2016. FINDINGS: Lung volumes are somewhat low with crowding of the bronchovascular structures. Bilateral interstitial opacities are most compatible with pulmonary edema. Aeration is improved compared to the prior exam. Small bilateral pleural effusions are noted. Heart size is upper normal. Aortic atherosclerosis is seen. IMPRESSION: Interstitial pulmonary edema with small bilateral pleural effusions. Electronically Signed   By: Drusilla Kannerhomas  Dalessio M.D.   On: 01/25/2017 13:23   Mr Foot Left Wo Contrast  Result Date: 01/26/2017 CLINICAL DATA:  Heel ulcer, 8 months. EXAM: MRI OF THE  LEFT FOOT WITHOUT CONTRAST TECHNIQUE: Multiplanar, multisequence MR imaging of the ankle was performed. No intravenous contrast was administered. COMPARISON:  01/25/2017 FINDINGS: TENDONS Peroneal: Unremarkable Posteromedial: Mild flexor hallucis longus tenosynovitis proximal to the knot of Henry. Anterior: Unremarkable Achilles: Mild distal tendinopathy. Low-level edema in the pre Achilles bursa. Plantar Fascia: Thickened medial band with adjacent edematous spur and surrounding edema. LIGAMENTS Lateral: Unremarkable Medial: Thickened superomedial portion of the spring ligament. CARTILAGE Ankle Joint: Moderate degenerative chondral thinning. Subtalar Joints/Sinus Tarsi: Prominent degenerative chondral thinning. Bones: Chronic osteomyelitis posteriorly and inferiorly in the calcaneus, as on image 12/8. Severe overlying ulceration down to the periosteal level with adjacent gas in the soft tissues. No drainable soft tissue abscess. Other: Edema in the plantar musculature of the foot, myositis is a possibility. Edema in the distal flexor hallucis longus muscle. IMPRESSION: 1. Chronic osteomyelitis posteriorly and inferiorly in the calcaneus with severe overlying ulceration down to the periosteal margin. Adjacent gas in the soft tissues. No obvious drainable pause collection in the soft tissues. 2. Potential myositis in the distal flexor hallucis longus muscle and along the plantar musculature of the foot. Mild flexor hallucis longus tenosynovitis. 3. Mild distal Achilles tendinopathy. 4. Plantar fasciitis. 5. Thickened superomedial portion of the spring ligament. 6. Degenerative chondral thinning in the tibiotalar joint and subtalar joint. Electronically Signed   By: Gaylyn Rong M.D.   On: 01/26/2017 11:42   Dg Foot Complete Left  Result Date: 01/25/2017 CLINICAL DATA:  Necrotic ulcer on the left heel.  Fever. EXAM: LEFT FOOT - COMPLETE 3+ VIEW COMPARISON:  11/20/2016 FINDINGS: Diffuse patchy osteopenia. There  is fragmentation of the cortex of the posterior aspect of the calcaneus adjacent to the soft tissue ulceration with gas in the soft tissues. The findings are consistent with osteomyelitis of the posterior aspect of the calcaneus. Extensive small vessel calcifications. Arthritic changes at the first MTP joint and of the IP joint of the great toe. IMPRESSION: Findings consistent with osteomyelitis of the posterior aspect of the calcaneus appear Electronically Signed   By: Francene Boyers M.D.   On: 01/25/2017 13:24      Assessment/Plan 1. PAD with ulceration and osteomyelitis left heel. Has previous history of intervention and if any consideration for limb salvage is given, should have another angiogram next week. In discussions with podiatry this may not be a salvageable situation and the patient would likely do best with proceeding with below-knee amputation. She cannot really make these decisions today and her family was unable to be reached. Further discussions will need to be ongoing. 2. Diabetes mellitus. Stable and outpatient medications and blood glucose control important in reducing the progression of atherosclerotic disease. Also, involved in wound healing. On appropriate medications. 3. Hypertension. Stable on outpatient medications and blood pressure control important in reducing the progression of atherosclerotic disease. On appropriate oral medications. 4. History of stroke. She is a poor historian. Family will need to be involved with her decision making and evaluation for amputation.   [name], MD  01/26/2017 5:48 PM    This note was created with Dragon medical transcription system.  Any error is purely unintentional

## 2017-01-26 NOTE — Progress Notes (Addendum)
Inpatient Diabetes Program Recommendations  AACE/ADA: New Consensus Statement on Inpatient Glycemic Control (2015)  Target Ranges:  Prepandial:   less than 140 mg/dL      Peak postprandial:   less than 180 mg/dL (1-2 hours)      Critically ill patients:  140 - 180 mg/dL   Lab Results  Component Value Date   GLUCAP 349 (H) 01/26/2017   HGBA1C 8.6 (H) 11/11/2016    Review of Glycemic Control  Results for KEONDA, DOW (MRN [id number]) as of 01/26/2017 14:50  Ref. Range 12/29/2016 16:20 01/25/2017 17:33 01/25/2017 21:09 01/26/2017 07:50 01/26/2017 12:21  Glucose-Capillary Latest Ref Range: 65 - 99 mg/dL 153 (H) 287 (H) 307 (H) 349 (H) 336 (H)    Diabetes history: Type 2 Outpatient Diabetes medications: Lantus 10 units qday, Novolog 1-15 units tid Current orders for Inpatient glycemic control: Lantus 10 units qday, Novolog 0-9 units tid, Novolog 0-5 units qhs  Inpatient Diabetes Program Recommendations:  Please consider adding Novolog 6 units tid, consider increasing Lantus to 15 units qhs (0.2units/kg)  Met with patient at the bedside- she was unsure of what insulin she gets because "my daughter gives it to me".  Patient showed me her forearms stating, "you can tell how often she gives me insulin, look at this (bruises)" - unsure of patient's cognitive status- please clarify with daughter where she is administering insulin when she is visiting with her mother.   Gentry Fitz, RN, BA, MHA, CDE Diabetes Coordinator Inpatient Diabetes Program  [phone] (Team Pager) [phone] ([place]) 01/26/2017 8:24 AM

## 2017-01-26 NOTE — [place] FL2 (Signed)
[place] MEDICAID FL2 LEVEL OF CARE SCREENING TOOL     IDENTIFICATION  Patient Name: [name] [name] Birthdate: 08-16-1934 Sex: female Admission Date (Current Location): 01/25/2017  [place] and [place] Number:  Chiropodist and Address:  [facility], [address] [place], [place] [address]      Provider Number: [id number]  Attending Physician Name and Address:  Delfino Lovett, MD  Relative Name and Phone Number:       Current Level of Care: Hospital Recommended Level of Care: Skilled Nursing Facility Prior Approval Number:    Date Approved/Denied:   PASRR Number:  ([id number] )  Discharge Plan: SNF    Current Diagnoses: Patient Active Problem List   Diagnosis Date Noted  . Pressure ulcer of left heel, stage 4 (HCC) 01/26/2017  . Acute osteomyelitis of left foot (HCC) 01/25/2017  . Pressure injury of skin 12/26/2016  . Acute respiratory distress 11/21/2016  . 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 Acute respiratory failure with hypoxia (HCC) 09/15/2016  . Carpal tunnel syndrome 09/01/2016  . Hypertension 09/01/2016  . PVD (peripheral vascular disease) (HCC) 09/01/2016  . Open abdominal wall wound 09/01/2016  . Abscess 08/21/2016  . Type 2 diabetes mellitus with complication (HCC) 05/20/2015  . Visual disturbance 05/20/2015  . H/O: CVA (cerebrovascular accident) 01/21/2015  . Difficulty in walking 08/27/2014  . Diabetes (HCC) 08/20/2014  . Diabetic neuropathy (HCC) 08/20/2014  . Dizzy spells 08/20/2014  . Hand numbness 08/20/2014  . Obesity 08/20/2014    Orientation RESPIRATION BLADDER Height & Weight     Self, Time, Situation, Place  Normal Continent Weight: 160 lb 9.6 oz (72.8 kg) Height:     BEHAVIORAL SYMPTOMS/MOOD NEUROLOGICAL BOWEL NUTRITION STATUS   (none)  (none) Continent Diet (Diet: Heart Healthy/ Carb Modified )  AMBULATORY STATUS COMMUNICATION OF NEEDS Skin   Extensive Assist  Verbally Surgical wounds, PU Stage and Appropriate Care (pressure ulcer stage 2 on right ankle. pressure ulcer unstagable on left heel. pressure ulcer stage 2 on sacrum. pressure ulcer unstagable on right anke. )                       Personal Care Assistance Level of Assistance  Bathing, Feeding, Dressing Bathing Assistance: Limited assistance Feeding assistance: Independent Dressing Assistance: Limited assistance     Functional Limitations Info  Sight, Hearing, Speech Sight Info: Adequate Hearing Info: Adequate Speech Info: Adequate    SPECIAL CARE FACTORS FREQUENCY  PT (By licensed PT), OT (By licensed OT)     PT Frequency:  (5) OT Frequency:  (5)            Contractures      Additional Factors Info  Code Status, Allergies, Insulin Sliding Scale Code Status Info:  (Full Code. ) Allergies Info:  (No Known Allergies. )   Insulin Sliding Scale Info:  (NovoLog Insulin Injections. )       Current Medications (01/26/2017):  This is the current hospital active medication list Current Facility-Administered Medications  Medication Dose Route Frequency Provider Last Rate Last Dose  . acetaminophen (TYLENOL) tablet 650 mg  650 mg Oral Q6H PRN Houston Siren, MD       Or  . acetaminophen (TYLENOL) suppository 650 mg  650 mg Rectal Q6H PRN Houston Siren, MD      . albuterol (PROVENTIL) (2.5 MG/3ML) 0.083% nebulizer solution 2.5 mg  2.5 mg Nebulization Q6H PRN Delsa Bern, RPH      .  aspirin EC tablet 81 mg  81 mg Oral Daily Houston SirenVivek J Sainani, MD   81 mg at 01/26/17 0858  . atorvastatin (LIPITOR) tablet 10 mg  10 mg Oral QHS Houston SirenVivek J Sainani, MD   10 mg at 01/25/17 2056  . carvedilol (COREG) tablet 3.125 mg  3.125 mg Oral BID WC Houston SirenVivek J Sainani, MD   3.125 mg at 01/26/17 0711  . clopidogrel (PLAVIX) tablet 75 mg  75 mg Oral Daily Houston SirenVivek J Sainani, MD   75 mg at 01/26/17 0857  . enoxaparin (LOVENOX) injection 30 mg  30 mg Subcutaneous Q24H Houston SirenVivek J Sainani, MD   30 mg at  01/25/17 2055  . furosemide (LASIX) tablet 20 mg  20 mg Oral BID Houston SirenVivek J Sainani, MD   20 mg at 01/26/17 0711  . gabapentin (NEURONTIN) capsule 200 mg  200 mg Oral BID Houston SirenVivek J Sainani, MD   200 mg at 01/26/17 0857  . insulin aspart (novoLOG) injection 0-5 Units  0-5 Units Subcutaneous QHS Houston SirenVivek J Sainani, MD   4 Units at 01/25/17 2123  . insulin aspart (novoLOG) injection 0-9 Units  0-9 Units Subcutaneous TID WC Houston SirenVivek J Sainani, MD   7 Units at 01/26/17 1226  . insulin aspart (novoLOG) injection 6 Units  6 Units Subcutaneous TID WC Vipul Sherryll BurgerShah, MD      . Melene Muller[START ON 01/27/2017] insulin glargine (LANTUS) injection 15 Units  15 Units Subcutaneous Daily Vipul Shah, MD      . lisinopril (PRINIVIL,ZESTRIL) tablet 2.5 mg  2.5 mg Oral Daily Houston SirenVivek J Sainani, MD   2.5 mg at 01/26/17 0857  . loratadine (CLARITIN) tablet 10 mg  10 mg Oral Daily Houston SirenVivek J Sainani, MD   10 mg at 01/26/17 0858  . mometasone-formoterol (DULERA) 200-5 MCG/ACT inhaler 2 puff  2 puff Inhalation BID Houston SirenVivek J Sainani, MD   2 puff at 01/26/17 0711  . ondansetron (ZOFRAN) tablet 4 mg  4 mg Oral Q6H PRN Houston SirenVivek J Sainani, MD       Or  . ondansetron (ZOFRAN) injection 4 mg  4 mg Intravenous Q6H PRN Houston SirenVivek J Sainani, MD      . piperacillin-tazobactam (ZOSYN) IVPB 3.375 g  3.375 g Intravenous Q8H Sheema M Hallaji, RPH   3.375 g at 01/26/17 1431  . vancomycin (VANCOCIN) 1,250 mg in sodium chloride 0.9 % 250 mL IVPB  1,250 mg Intravenous Q36H Sheema M Hallaji, RPH   1,250 mg at 01/26/17 0332  . Vitamin D (Ergocalciferol) (DRISDOL) capsule 50,000 Units  50,000 Units Oral Weekly Houston SirenVivek J Sainani, MD   50,000 Units at 01/25/17 2055     Discharge Medications: Please see discharge summary for a list of discharge medications.  Relevant Imaging Results:  Relevant Lab Results:   Additional Information  (SSN: [id number])  Kaitlan Bin, Darleen CrockerBailey M, LCSW

## 2017-01-26 NOTE — Clinical Social Work Placement (Signed)
   CLINICAL SOCIAL WORK PLACEMENT  NOTE  Date:  01/26/2017  Patient Details  Name: [name] [name] MRN: [id number] Date of Birth: Jun 29, 1934  Clinical Social Work is seeking post-discharge placement for this patient at the Skilled  Nursing Facility level of care (*CSW will initial, date and re-position this form in  chart as items are completed):  Yes   Patient/family provided with [facility] Clinical Social Work Department's list of facilities offering this level of care within the geographic area requested by the patient (or if unable, by the patient's family).  Yes   Patient/family informed of their freedom to choose among providers that offer the needed level of care, that participate in Medicare, Medicaid or managed care program needed by the patient, have an available bed and are willing to accept the patient.  Yes   Patient/family informed of [facility]'s ownership interest in [facility] and [facility], as well as of the fact that they are under no obligation to receive care at these facilities.  PASRR submitted to EDS on       PASRR number received on       Existing PASRR number confirmed on 01/26/17     FL2 transmitted to all facilities in geographic area requested by pt/family on 01/26/17     FL2 transmitted to all facilities within larger geographic area on       Patient informed that his/her managed care company has contracts with or will negotiate with certain facilities, including the following:            Patient/family informed of bed offers received.  Patient chooses bed at       Physician recommends and patient chooses bed at      Patient to be transferred to   on  .  Patient to be transferred to facility by       Patient family notified on   of transfer.  Name of family member notified:        PHYSICIAN       Additional Comment:    _______________________________________________ Jasmain Ahlberg, Darleen CrockerBailey M, LCSW 01/26/2017, 4:17 PM

## 2017-01-27 ENCOUNTER — Ambulatory Visit: Payer: Self-pay | Admitting: *Deleted

## 2017-01-27 DIAGNOSIS — I70262 Atherosclerosis of native arteries of extremities with gangrene, left leg: Secondary | ICD-10-CM

## 2017-01-27 LAB — BASIC METABOLIC PANEL
Anion gap: 6 (ref 5–15)
BUN: 45 mg/dL — AB (ref 6–20)
CHLORIDE: 104 mmol/L (ref 101–111)
CO2: 29 mmol/L (ref 22–32)
CREATININE: 1.34 mg/dL — AB (ref 0.44–1.00)
Calcium: 8.6 mg/dL — ABNORMAL LOW (ref 8.9–10.3)
GFR calc non Af Amer: 36 mL/min — ABNORMAL LOW (ref >60)
GFR, EST AFRICAN AMERICAN: 42 mL/min — AB (ref >60)
Glucose, Bld: 160 mg/dL — ABNORMAL HIGH (ref 65–99)
Potassium: 4.3 mmol/L (ref 3.5–5.1)
Sodium: 139 mmol/L (ref 135–145)

## 2017-01-27 LAB — GLUCOSE, RANDOM: GLUCOSE: 445 mg/dL — AB (ref 65–99)

## 2017-01-27 LAB — GLUCOSE, CAPILLARY
GLUCOSE-CAPILLARY: 248 mg/dL — AB (ref 65–99)
GLUCOSE-CAPILLARY: 413 mg/dL — AB (ref 65–99)
GLUCOSE-CAPILLARY: 248 mg/dL — AB (ref 65–99)
Glucose-Capillary: 147 mg/dL — ABNORMAL HIGH (ref 65–99)
Glucose-Capillary: 325 mg/dL — ABNORMAL HIGH (ref 65–99)
Glucose-Capillary: 372 mg/dL — ABNORMAL HIGH (ref 65–99)

## 2017-01-27 LAB — CBC
HEMATOCRIT: 26 % — AB (ref 35.0–47.0)
HEMOGLOBIN: 8.5 g/dL — AB (ref 12.0–16.0)
MCH: 26.8 pg (ref 26.0–34.0)
MCHC: 32.7 g/dL (ref 32.0–36.0)
MCV: 81.8 fL (ref 80.0–100.0)
Platelets: 354 10*3/uL (ref 150–440)
RBC: 3.17 MIL/uL — ABNORMAL LOW (ref 3.80–5.20)
RDW: 20 % — ABNORMAL HIGH (ref 11.5–14.5)
WBC: 9.5 10*3/uL (ref 3.6–11.0)

## 2017-01-27 MED ORDER — INSULIN ASPART 100 UNIT/ML ~~LOC~~ SOLN
15.0000 [IU] | Freq: Once | SUBCUTANEOUS | Status: AC
  Administered 2017-01-27: 15 [IU] via SUBCUTANEOUS

## 2017-01-27 MED ORDER — VANCOMYCIN HCL IN DEXTROSE 750-5 MG/150ML-% IV SOLN
750.0000 mg | INTRAVENOUS | Status: DC
  Administered 2017-01-27: 750 mg via INTRAVENOUS
  Filled 2017-01-27 (×2): qty 150

## 2017-01-27 MED ORDER — ENOXAPARIN SODIUM 40 MG/0.4ML ~~LOC~~ SOLN
40.0000 mg | SUBCUTANEOUS | Status: DC
  Administered 2017-01-27: 40 mg via SUBCUTANEOUS
  Filled 2017-01-27: qty 0.4

## 2017-01-27 MED ORDER — INSULIN GLARGINE 100 UNIT/ML ~~LOC~~ SOLN
13.0000 [IU] | Freq: Every day | SUBCUTANEOUS | Status: DC
  Filled 2017-01-27: qty 0.13

## 2017-01-27 MED ORDER — INSULIN ASPART 100 UNIT/ML ~~LOC~~ SOLN
3.0000 [IU] | Freq: Three times a day (TID) | SUBCUTANEOUS | Status: DC
  Administered 2017-01-27: 3 [IU] via SUBCUTANEOUS

## 2017-01-27 NOTE — Progress Notes (Signed)
Notified Dr Sherryll BurgerShah of FSBS 413. Received order to give 15 units novolog in addition to 5 units from sliding scale to equal 20 units

## 2017-01-27 NOTE — Progress Notes (Signed)
Inpatient Diabetes Program Recommendations  AACE/ADA: New Consensus Statement on Inpatient Glycemic Control (2015)  Target Ranges:  Prepandial:   less than 140 mg/dL      [facility] postprandial:   less than 180 mg/dL (1-2 hours)      Critically ill patients:  140 - 180 mg/dL   Lab Results  Component Value Date   GLUCAP 147 (H) 01/27/2017   HGBA1C 8.6 (H) 11/11/2016    Review of Glycemic Control  Results for [name] [name], [name] [name] (MRN [id number]) as of 01/27/2017 08:10  Ref. Range 01/26/2017 07:50 01/26/2017 12:21 01/26/2017 15:39 01/26/2017 21:03 01/27/2017 07:38  Glucose-Capillary Latest Ref Range: 65 - 99 mg/dL 409349 (H) 811336 (H) 914341 (H) 207 (H) 147 (H)    Diabetes history: Type 2 Outpatient Diabetes medications: Lantus 10 units qday, Novolog 1-15 units tid Current orders for Inpatient glycemic control: Lantus 15 units qday, Novolog 0-9 units tid, Novolog 0-5 units qhs, Novolog 6 units tid with meals  Inpatient Diabetes Program Recommendations:  Please consider adding Novolog 3 units tid, consider decreasing Lantus to 13 units qhs (0.2units/kg)  Text page to Dr. Alda LeaShah  Kruz Chiu, RN, BA, MHA, CDE

## 2017-01-27 NOTE — Progress Notes (Signed)
Per [facility] reported that patient has 0 SNF days left and has not had a 60 day wellness period for SNF days to reset. Clinical Social Worker (CSW) met with patient and made her aware of above. CSW contacted patient's cousin [name] and made her aware of above. Per [name] patient has too many assets to qualify for medicaid. CSW explained that patient can pay privately for SNF. Per [name] patient cannot afford that. CSW explained that the only option is for patient to go home and continue home health services. [name] verbalized her understanding. RN case manager aware of above. CSW will continue to follow and assist as needed.   [employer], LCSW [phone]

## 2017-01-27 NOTE — Progress Notes (Signed)
Pharmacy Antibiotic Note  [name] [name] is a 83 y.o. female admitted on 01/25/2017 with Osteomyelitis.  Pharmacy has been consulted for Vancomycin and Zosyn dosing.  Patient received 1 dose  Vancomycin 1250mg , cefepime and levofloxacin in ED.   Plan: Ke: 0.028   T1/2: 25   Vd: 51  Will start the patient on Vancomycin 1250mg  IV every 36 hours with 12 hour stack dosing. Calculated trough at Css is 18. Plan for trough prior to 4th dose. Monitor renal function daily and adjust dose if needed.   Will start patient on Zosyn 3.375 IV EI every 8 hours.   2/9: Scr improved. Will adjust Vancomycin to 750mg  Q24h. Will check trough on 2/11. Goal 15-20 mcg/ml.      Height: 5\' 3"  (160 cm) Weight: 160 lb 9.6 oz (72.8 kg) IBW/kg (Calculated) : 52.4  Temp (24hrs), Avg:98.2 F (36.8 C), Min:97.8 F (36.6 C), Max:98.4 F (36.9 C)   Recent Labs Lab 01/25/17 1226 01/25/17 1227 01/25/17 1743 01/26/17 0444 01/27/17 0503  WBC  --  7.1  --  4.8 9.5  CREATININE  --  1.61*  --  1.48* 1.34*  LATICACIDVEN 0.8  --  0.9  --   --     Estimated Creatinine Clearance: 32 mL/min (by C-G formula based on SCr of 1.34 mg/dL (H)).    No Known Allergies  Antimicrobials this admission: 2/7 Levofloxacin/cefepime x 1 dose 2/7 Vancomycin >> 2/8 Zosyn  >>   Dose adjustments this admission: 2/9  Vanc 1250mg  q36h changed to 750mg  q24h  Microbiology results: 2/7 BCx: sent Wound cx  Thank you for allowing pharmacy to be a part of this patient's care.  Angelique BlonderMerrill,Kerrigan Glendening A, PharmD, BCPS Clinical Pharmacist 01/27/2017 11:42 AM

## 2017-01-27 NOTE — Progress Notes (Signed)
Checked SNF benefits and days used through [facility] ([phone]).  Patient has a Medicare Advantage [place] plan active as of 12/19/16.  No deductible.  Out of pocket max is $5900, [phone] met so far.  In-Network SNF: $0 copay for days 1-20 and $167 copay per day for days 21-100. Limited to 100 days each benefit period.  Per rep, records show no SNF days used in 2018 and last day used in 2017 as 11/17/16.  As more than 60 days have passed, days have reset to full 100.   Josem Kaufmann is required.    Call reference for benefits: [id number] (rep - Ann) Call reference for days used: [id number] (rep Sharyn Lull)

## 2017-01-27 NOTE — Progress Notes (Addendum)
Clinical Social Worker (CSW) presented bed offers to patient and she chose [facility]. Patient has agreed to pay [facility] her remaining balance of $385 from a previous admission. Per patient her cousin [name] pays all her bills. CSW contacted [name] and spoke with patient's brother Jillyn HiddenGary. Per Jillyn HiddenGary he will ask [name] to call me back. Per brother him and his sister are [facility] to discuss BKA with MD. Lovett CalenderJoseph [facility] liaison is aware of above and will [facility] today. CSW will continue to follow and assist as needed.   Patient's cousin [name] called CSW back and reported that she would take care of the bill. Per Jomarie LongsJoseph the bill has been taken care of.   [employer]Bailey Amahd Morino, LCSW [phone]

## 2017-01-27 NOTE — Progress Notes (Signed)
[facility]  Daily Progress Note   Subjective  - * No surgery found *  No events overnight. He just ate her whole breakfast without event. Says her foot is not hurting. Patient remains a poor historian.  Objective Vitals:   01/26/17 1540 01/26/17 1915 01/27/17 0359 01/27/17 0740  BP: (!) 126/55 (!) 141/56 (!) 112/50 (!) 127/53  Pulse: 73 72 72 71  Resp: 12 19 16    Temp: 98 F (36.7 C) 98.4 F (36.9 C) 98.4 F (36.9 C) 97.8 F (36.6 C)  TempSrc: Oral Oral Oral Oral  SpO2: 96% 100% 95% 95%  Weight:        Intake/Output Summary (Last 24 hours) at 01/27/17 0913 Last data filed at 01/26/17 1844  Gross per 24 hour  Intake             1020 ml  Output                0 ml  Net             1020 ml    PULM  CTAB CV  RRR VASC  Left foot dressed. No erythema or significant swelling this morning. 1+ posterior tibial pulse, trace dorsalis pedis pulse on the left.  Laboratory CBC    Component Value Date/Time   WBC 9.5 01/27/2017 0503   HGB 8.5 (L) 01/27/2017 0503   HGB 11.5 (L) 02/04/2014 1522   HCT 26.0 (L) 01/27/2017 0503   HCT 35.8 02/04/2014 1522   PLT 354 01/27/2017 0503   PLT 174 02/04/2014 1522    BMET    Component Value Date/Time   NA 139 01/27/2017 0503   NA 141 02/04/2014 1636   K 4.3 01/27/2017 0503   K 4.3 02/04/2014 1636   CL 104 01/27/2017 0503   CL 108 (H) 02/04/2014 1636   CO2 29 01/27/2017 0503   CO2 29 02/04/2014 1636   GLUCOSE 160 (H) 01/27/2017 0503   GLUCOSE 225 (H) 02/04/2014 1636   BUN 45 (H) 01/27/2017 0503   BUN 12 02/04/2014 1636   CREATININE 1.34 (H) 01/27/2017 0503   CREATININE 1.06 02/04/2014 1636   CALCIUM 8.6 (L) 01/27/2017 0503   CALCIUM 9.3 02/04/2014 1636   GFRNONAA 36 (L) 01/27/2017 0503   GFRNONAA 51 (L) 02/04/2014 1636   GFRAA 42 (L) 01/27/2017 0503   GFRAA 59 (L) 02/04/2014 1636    Assessment/Planning:    Osteomyelitis of the left heel and foot that is likely not salvageable at this  point.  PAD. Status post previous interventions. If limb salvage was felt to be an option, angiogram would need to be performed.  The patient likely needs a left below-knee amputation. I don't think she has decision-making capacity to process this were agreed to that. Family has not been able to be reached at this point. They will need to be involved before proceeding with any sort of major amputation.  I will not be here this weekend and will reassess on Monday. If any urgent matters, up, please contact the physician on call.    [name]  01/27/2017, 9:13 AM

## 2017-01-27 NOTE — Consult Note (Signed)
[facility] Infectious Disease     Reason for Consult: Osteomyelitis   Referring Physician: Carlynn Spry Date of Admission:  01/25/2017   Active Problems:   Acute osteomyelitis of left foot ([place])   Pressure ulcer of left heel, stage 4 (HCC)   HPI: [name] [name] is a 83 y.o. female Admitted with fever and left foot pain.  She has been dealing with a left heel ulcer with increasing pain and foul-smelling drainage.  She is followed by home health nurse.  In the ED patient's x-ray showed osteomyelitis of the heel.  Patient was admitted and has been seen by podiatry who feels this is quite an extensive infection and patient would most likely benefit from a BKA. Her brother is in the room with her today. She is not very ambulatory apparently due to peripheral neuropathy and is mainly bedbound. Patient had recent admission in January of this year with COPD exacerbation as well as acute on chronic CHF.  He also has a history of diabetes chronic kidney disease  Past Medical History:  Diagnosis Date  . Diabetes mellitus without complication ([place])   . Hypertension   . Stroke [facility])    Past Surgical History:  Procedure Laterality Date  . PERIPHERAL VASCULAR CATHETERIZATION N/A 11/14/2016   Procedure: Lower Extremity Angiography;  Surgeon: Algernon Huxley, MD;  Location: [place] CV LAB;  Service: Cardiovascular;  Laterality: N/A;  . PERIPHERAL VASCULAR CATHETERIZATION Right 11/21/2016   Procedure: Lower Extremity Angiography;  Surgeon: Algernon Huxley, MD;  Location: [place] [facility];  Service: Cardiovascular;  Laterality: Right;  . PERIPHERAL VASCULAR CATHETERIZATION N/A 11/21/2016   Procedure: Abdominal Aortogram w/Lower Extremity;  Surgeon: Algernon Huxley, MD;  Location: [place] [facility];  Service: Cardiovascular;  Laterality: N/A;  . PERIPHERAL VASCULAR CATHETERIZATION  11/21/2016   Procedure: Lower Extremity Intervention;  Surgeon: Algernon Huxley, MD;  Location: [place] [facility];  Service:  Cardiovascular;;   Social History  Substance Use Topics  . Smoking status: Former Smoker    Types: Cigarettes  . Smokeless tobacco: Never Used  . Alcohol use No   Family History  Problem Relation Age of Onset  . Stroke Mother   . Stroke Father   . Cancer Father   . Heart disease Paternal Grandfather   . Diabetes Sister   . Diabetes Brother     Allergies: No Known Allergies  Current antibiotics: Antibiotics Given (last 72 hours)    Date/Time Action Medication Dose Rate   01/26/17 0332 Given   vancomycin (VANCOCIN) 1,250 mg in sodium chloride 0.9 % 250 mL IVPB 1,250 mg 166.7 mL/hr   01/26/17 0456 Given   piperacillin-tazobactam (ZOSYN) IVPB 3.375 g 3.375 g 12.5 mL/hr   01/26/17 1431 Given   piperacillin-tazobactam (ZOSYN) IVPB 3.375 g 3.375 g 12.5 mL/hr   01/26/17 2056 Given   piperacillin-tazobactam (ZOSYN) IVPB 3.375 g 3.375 g 12.5 mL/hr   01/27/17 0559 Given   piperacillin-tazobactam (ZOSYN) IVPB 3.375 g 3.375 g 12.5 mL/hr      MEDICATIONS: . aspirin EC  81 mg Oral Daily  . atorvastatin  10 mg Oral QHS  . carvedilol  3.125 mg Oral BID WC  . clopidogrel  75 mg Oral Daily  . enoxaparin (LOVENOX) injection  30 mg Subcutaneous Q24H  . furosemide  20 mg Oral BID  . gabapentin  200 mg Oral BID  . insulin aspart  0-5 Units Subcutaneous QHS  . insulin aspart  0-9 Units Subcutaneous TID WC  .  insulin aspart  6 Units Subcutaneous TID WC  . insulin glargine  15 Units Subcutaneous Daily  . lisinopril  2.5 mg Oral Daily  . loratadine  10 mg Oral Daily  . mometasone-formoterol  2 puff Inhalation BID  . piperacillin-tazobactam (ZOSYN)  IV  3.375 g Intravenous Q8H  . vancomycin  1,250 mg Intravenous Q36H  . Vitamin D (Ergocalciferol)  50,000 Units Oral Weekly    Review of Systems - 11 systems reviewed and negative per HPI   OBJECTIVE: Temp:  [97.8 F (36.6 C)-98.4 F (36.9 C)] 97.8 F (36.6 C) (02/09 0740) Pulse Rate:  [71-73] 71 (02/09 0740) Resp:  [12-19] 16  (02/09 0359) BP: (112-141)/(50-56) 127/53 (02/09 0740) SpO2:  [95 %-100 %] 95 % (02/09 0740)  Physical Exam  Constitutional:  oriented to person, place, and time. appears well-developed and well-nourished. No distress.  HENT: [place]/AT, PERRLA, no scleral icterus Mouth/Throat: Oropharynx is clear and moist. No oropharyngeal exudate.  Cardiovascular: Normal rate, regular rhythm and normal heart sounds. Pulmonary/Chest: Effort normal and breath sounds normal. No respiratory distress.  has no wheezes.  Neck = supple, no nuchal rigidity Abdominal: Soft. Bowel sounds are normal.  exhibits no distension. There is no tenderness.  Lymphadenopathy: no cervical adenopathy. No axillary adenopathy Neurological: alert and oriented to person, place, and time.  Skin: L heel with extensive necrotic black eschar, foul odor,  Ext no edmea Vascular diminished DP, TP Psychiatric: a normal mood and affect.  behavior is normal.    LABS: Results for orders placed or performed during the hospital encounter of 01/25/17 (from the past 48 hour(s))  Culture, blood (routine x 2)     Status: None (Preliminary result)   Collection Time: 01/25/17 12:26 PM  Result Value Ref Range   Specimen Description BLOOD RIGHT FA    Special Requests BOTTLES DRAWN AEROBIC AND ANAEROBIC BCHV    Culture NO GROWTH 2 DAYS    Report Status PENDING   Lactic acid, plasma     Status: None   Collection Time: 01/25/17 12:26 PM  Result Value Ref Range   Lactic Acid, Venous 0.8 0.5 - 1.9 mmol/L  Comprehensive metabolic panel     Status: Abnormal   Collection Time: 01/25/17 12:27 PM  Result Value Ref Range   Sodium 140 135 - 145 mmol/L   Potassium 4.6 3.5 - 5.1 mmol/L   Chloride 106 101 - 111 mmol/L   CO2 28 22 - 32 mmol/L   Glucose, Bld 259 (H) 65 - 99 mg/dL   BUN 44 (H) 6 - 20 mg/dL   Creatinine, Ser 1.61 (H) 0.44 - 1.00 mg/dL   Calcium 8.4 (L) 8.9 - 10.3 mg/dL   Total Protein 7.3 6.5 - 8.1 g/dL   Albumin 2.4 (L) 3.5 - 5.0 g/dL   AST  14 (L) 15 - 41 U/L   ALT 9 (L) 14 - 54 U/L   Alkaline Phosphatase 78 38 - 126 U/L   Total Bilirubin 0.5 0.3 - 1.2 mg/dL   GFR calc non Af Amer 29 (L) >60 mL/min   GFR calc Af Amer 34 (L) >60 mL/min    Comment: (NOTE) The eGFR has been calculated using the CKD EPI equation. This calculation has not been validated in all clinical situations. eGFR's persistently <60 mL/min signify possible Chronic Kidney Disease.    Anion gap 6 5 - 15  CBC with Differential     Status: Abnormal   Collection Time: 01/25/17 12:27 PM  Result Value Ref Range  WBC 7.1 3.6 - 11.0 K/uL   RBC 2.78 (L) 3.80 - 5.20 MIL/uL   Hemoglobin 7.6 (L) 12.0 - 16.0 g/dL   HCT 22.8 (L) 35.0 - 47.0 %   MCV 81.7 80.0 - 100.0 fL   MCH 27.2 26.0 - 34.0 pg   MCHC 33.3 32.0 - 36.0 g/dL   RDW 20.2 (H) 11.5 - 14.5 %   Platelets 313 150 - 440 K/uL   Neutrophils Relative % 83 %   Neutro Abs 5.9 1.4 - 6.5 K/uL   Lymphocytes Relative 13 %   Lymphs Abs 0.9 (L) 1.0 - 3.6 K/uL   Monocytes Relative 3 %   Monocytes Absolute 0.2 0.2 - 0.9 K/uL   Eosinophils Relative 1 %   Eosinophils Absolute 0.1 0 - 0.7 K/uL   Basophils Relative 0 %   Basophils Absolute 0.0 0 - 0.1 K/uL  Culture, blood (routine x 2)     Status: None (Preliminary result)   Collection Time: 01/25/17  1:20 PM  Result Value Ref Range   Specimen Description BLOOD R FA    Special Requests BOTTLES DRAWN AEROBIC AND ANAEROBIC BCAV    Culture NO GROWTH 2 DAYS    Report Status PENDING   Blood gas, venous     Status: Abnormal (Preliminary result)   Collection Time: 01/25/17  1:57 PM  Result Value Ref Range   FIO2 PENDING    pH, Ven 7.35 7.250 - 7.430   pCO2, Ven 55 44.0 - 60.0 mmHg   pO2, Ven <31.0 (LL) 32.0 - 45.0 mmHg    Comment: VENOUS   Bicarbonate 30.4 (H) 20.0 - 28.0 mmol/L   Acid-Base Excess 3.4 (H) 0.0 - 2.0 mmol/L   Patient temperature 37.0    Collection site VEIN    Sample type VENOUS   Glucose, capillary     Status: Abnormal   Collection Time:  01/25/17  5:33 PM  Result Value Ref Range   Glucose-Capillary 287 (H) 65 - 99 mg/dL   Comment 1 Notify RN   Lactic acid, plasma     Status: None   Collection Time: 01/25/17  5:43 PM  Result Value Ref Range   Lactic Acid, Venous 0.9 0.5 - 1.9 mmol/L  Aerobic/Anaerobic Culture (surgical/deep wound)     Status: None (Preliminary result)   Collection Time: 01/25/17  7:00 PM  Result Value Ref Range   Specimen Description FOOT    Special Requests NONE    Gram Stain      NO WBC SEEN RARE GRAM VARIABLE ROD Performed at [facility], [place] [address] [place],  [address]    Culture PENDING    Report Status PENDING   Glucose, capillary     Status: Abnormal   Collection Time: 01/25/17  9:09 PM  Result Value Ref Range   Glucose-Capillary 307 (H) 65 - 99 mg/dL   Comment 1 Notify RN   Basic metabolic panel     Status: Abnormal   Collection Time: 01/26/17  4:44 AM  Result Value Ref Range   Sodium 136 135 - 145 mmol/L   Potassium 5.1 3.5 - 5.1 mmol/L   Chloride 103 101 - 111 mmol/L   CO2 27 22 - 32 mmol/L   Glucose, Bld 355 (H) 65 - 99 mg/dL   BUN 47 (H) 6 - 20 mg/dL   Creatinine, Ser 1.48 (H) 0.44 - 1.00 mg/dL   Calcium 8.6 (L) 8.9 - 10.3 mg/dL   GFR calc non Af Amer 32 (  L) >60 mL/min   GFR calc Af Amer 37 (L) >60 mL/min    Comment: (NOTE) The eGFR has been calculated using the CKD EPI equation. This calculation has not been validated in all clinical situations. eGFR's persistently <60 mL/min signify possible Chronic Kidney Disease.    Anion gap 6 5 - 15  CBC     Status: Abnormal   Collection Time: 01/26/17  4:44 AM  Result Value Ref Range   WBC 4.8 3.6 - 11.0 K/uL   RBC 3.06 (L) 3.80 - 5.20 MIL/uL   Hemoglobin 8.0 (L) 12.0 - 16.0 g/dL   HCT 25.1 (L) 35.0 - 47.0 %   MCV 81.9 80.0 - 100.0 fL   MCH 26.2 26.0 - 34.0 pg   MCHC 31.9 (L) 32.0 - 36.0 g/dL   RDW 19.7 (H) 11.5 - 14.5 %   Platelets 322 150 - 440 K/uL  Glucose, capillary     Status: Abnormal   Collection  Time: 01/26/17  7:50 AM  Result Value Ref Range   Glucose-Capillary 349 (H) 65 - 99 mg/dL   Comment 1 Notify RN    Comment 2 Document in Chart   Glucose, capillary     Status: Abnormal   Collection Time: 01/26/17 12:21 PM  Result Value Ref Range   Glucose-Capillary 336 (H) 65 - 99 mg/dL  Glucose, capillary     Status: Abnormal   Collection Time: 01/26/17  3:39 PM  Result Value Ref Range   Glucose-Capillary 341 (H) 65 - 99 mg/dL   Comment 1 Notify RN    Comment 2 Document in Chart   Glucose, capillary     Status: Abnormal   Collection Time: 01/26/17  9:03 PM  Result Value Ref Range   Glucose-Capillary 207 (H) 65 - 99 mg/dL  CBC     Status: Abnormal   Collection Time: 01/27/17  5:03 AM  Result Value Ref Range   WBC 9.5 3.6 - 11.0 K/uL   RBC 3.17 (L) 3.80 - 5.20 MIL/uL   Hemoglobin 8.5 (L) 12.0 - 16.0 g/dL   HCT 26.0 (L) 35.0 - 47.0 %   MCV 81.8 80.0 - 100.0 fL   MCH 26.8 26.0 - 34.0 pg   MCHC 32.7 32.0 - 36.0 g/dL   RDW 20.0 (H) 11.5 - 14.5 %   Platelets 354 150 - 440 K/uL  Basic metabolic panel     Status: Abnormal   Collection Time: 01/27/17  5:03 AM  Result Value Ref Range   Sodium 139 135 - 145 mmol/L   Potassium 4.3 3.5 - 5.1 mmol/L   Chloride 104 101 - 111 mmol/L   CO2 29 22 - 32 mmol/L   Glucose, Bld 160 (H) 65 - 99 mg/dL   BUN 45 (H) 6 - 20 mg/dL   Creatinine, Ser 1.34 (H) 0.44 - 1.00 mg/dL   Calcium 8.6 (L) 8.9 - 10.3 mg/dL   GFR calc non Af Amer 36 (L) >60 mL/min   GFR calc Af Amer 42 (L) >60 mL/min    Comment: (NOTE) The eGFR has been calculated using the CKD EPI equation. This calculation has not been validated in all clinical situations. eGFR's persistently <60 mL/min signify possible Chronic Kidney Disease.    Anion gap 6 5 - 15  Glucose, capillary     Status: Abnormal   Collection Time: 01/27/17  7:38 AM  Result Value Ref Range   Glucose-Capillary 147 (H) 65 - 99 mg/dL   No components found for: ESR,  C REACTIVE PROTEIN MICRO: Recent Results (from  the past 720 hour(s))  Culture, blood (single) w Reflex to ID Panel     Status: None   Collection Time: 12/28/16  2:31 PM  Result Value Ref Range Status   Specimen Description BLOOD RIGHT FATTY CASTS  Final   Special Requests BOTTLES DRAWN AEROBIC AND ANAEROBIC 1MLAERO,1MLANA  Final   Culture NO GROWTH 5 DAYS  Final   Report Status 01/02/2017 FINAL  Final  Culture, blood (routine x 2)     Status: None (Preliminary result)   Collection Time: 01/25/17 12:26 PM  Result Value Ref Range Status   Specimen Description BLOOD RIGHT FA  Final   Special Requests BOTTLES DRAWN AEROBIC AND ANAEROBIC BCHV  Final   Culture NO GROWTH 2 DAYS  Final   Report Status PENDING  Incomplete  Culture, blood (routine x 2)     Status: None (Preliminary result)   Collection Time: 01/25/17  1:20 PM  Result Value Ref Range Status   Specimen Description BLOOD R FA  Final   Special Requests BOTTLES DRAWN AEROBIC AND ANAEROBIC BCAV  Final   Culture NO GROWTH 2 DAYS  Final   Report Status PENDING  Incomplete  Aerobic/Anaerobic Culture (surgical/deep wound)     Status: None (Preliminary result)   Collection Time: 01/25/17  7:00 PM  Result Value Ref Range Status   Specimen Description FOOT  Final   Special Requests NONE  Final   Gram Stain   Final    NO WBC SEEN RARE GRAM VARIABLE ROD Performed at [facility], [place] [address] [place], [place] [address]    Culture PENDING  Incomplete   Report Status PENDING  Incomplete    IMAGING: Dg Chest 2 View  Result Date: 01/25/2017 CLINICAL DATA:  Fever and possible bilateral foot infection. EXAM: CHEST  2 VIEW COMPARISON:  Single-view of the chest 12/24/2016 and 11/21/2016. FINDINGS: Lung volumes are somewhat low with crowding of the bronchovascular structures. Bilateral interstitial opacities are most compatible with pulmonary edema. Aeration is improved compared to the prior exam. Small bilateral pleural effusions are noted. Heart size is upper normal. Aortic  atherosclerosis is seen. IMPRESSION: Interstitial pulmonary edema with small bilateral pleural effusions. Electronically Signed   By: Inge Rise M.D.   On: 01/25/2017 13:23   Mr Foot Left Wo Contrast  Result Date: 01/26/2017 CLINICAL DATA:  Heel ulcer, 8 months. EXAM: MRI OF THE LEFT FOOT WITHOUT CONTRAST TECHNIQUE: Multiplanar, multisequence MR imaging of the ankle was performed. No intravenous contrast was administered. COMPARISON:  01/25/2017 FINDINGS: TENDONS Peroneal: Unremarkable Posteromedial: Mild flexor hallucis longus tenosynovitis proximal to the knot of Henry. Anterior: Unremarkable Achilles: Mild distal tendinopathy. Low-level edema in the pre Achilles bursa. Plantar Fascia: Thickened medial band with adjacent edematous spur and surrounding edema. LIGAMENTS Lateral: Unremarkable Medial: Thickened superomedial portion of the spring ligament. CARTILAGE Ankle Joint: Moderate degenerative chondral thinning. Subtalar Joints/Sinus Tarsi: Prominent degenerative chondral thinning. Bones: Chronic osteomyelitis posteriorly and inferiorly in the calcaneus, as on image 12/8. Severe overlying ulceration down to the periosteal level with adjacent gas in the soft tissues. No drainable soft tissue abscess. Other: Edema in the plantar musculature of the foot, myositis is a possibility. Edema in the distal flexor hallucis longus muscle. IMPRESSION: 1. Chronic osteomyelitis posteriorly and inferiorly in the calcaneus with severe overlying ulceration down to the periosteal margin. Adjacent gas in the soft tissues. No obvious drainable pause collection in the soft tissues. 2. Potential myositis in the  distal flexor hallucis longus muscle and along the plantar musculature of the foot. Mild flexor hallucis longus tenosynovitis. 3. Mild distal Achilles tendinopathy. 4. Plantar fasciitis. 5. Thickened superomedial portion of the spring ligament. 6. Degenerative chondral thinning in the tibiotalar joint and subtalar  joint. Electronically Signed   By: Van Clines M.D.   On: 01/26/2017 11:42   Dg Foot Complete Left  Result Date: 01/25/2017 CLINICAL DATA:  Necrotic ulcer on the left heel.  Fever. EXAM: LEFT FOOT - COMPLETE 3+ VIEW COMPARISON:  11/20/2016 FINDINGS: Diffuse patchy osteopenia. There is fragmentation of the cortex of the posterior aspect of the calcaneus adjacent to the soft tissue ulceration with gas in the soft tissues. The findings are consistent with osteomyelitis of the posterior aspect of the calcaneus. Extensive small vessel calcifications. Arthritic changes at the first MTP joint and of the IP joint of the great toe. IMPRESSION: Findings consistent with osteomyelitis of the posterior aspect of the calcaneus appear Electronically Signed   By: Lorriane Shire M.D.   On: 01/25/2017 13:24    Assessment:   [name] [name] is a 83 y.o. female With diabetes and chronic kidney disease admitted with chronic ulceration of the heel with MRI evidence of osteomyelitis as well as myositis.  On admission white count was 7.1 patient has been afebrile.  She has been on Zosyn.  Blood cultures and wound cultures are pending.  She has been seen by podiatry and vascular.  BKA has been recommended.   I discussed with her and her brother the rec for a BKA and that I agree with this. She is at risk of sepsis from this infection and if does not have a BKA will need PICC and IV abx. They are considering it and seem to be amendable to it.   Recommendations At this point I would continue Zosyn.  If she ends up undergoing angiography and surgical intervention bone culture should be sent.  She would then likely need 6 weeks of IV antibiotics. However if she ends up having a BKA in the infection is removed she could be treated with a short course of postoperative antibiotics.  Thank you very much for allowing me to participate in the care of this patient. Please call with questions.   Cheral Marker. Ola Spurr, MD

## 2017-01-27 NOTE — Progress Notes (Addendum)
Anticoagulation monitoring(Lovenox):  82yo  F ordered Lovenox 30 mg Q24h  Filed Weights   01/25/17 1333 01/25/17 1730  Weight: 160 lb 9.6 oz (72.8 kg) 160 lb 9.6 oz (72.8 kg)   BMI 28.5   Lab Results  Component Value Date   CREATININE 1.34 (H) 01/27/2017   CREATININE 1.48 (H) 01/26/2017   CREATININE 1.61 (H) 01/25/2017   Estimated Creatinine Clearance: 32 mL/min (by C-G formula based on SCr of 1.34 mg/dL (H)). Hemoglobin & Hematocrit     Component Value Date/Time   HGB 8.5 (L) 01/27/2017 0503   HGB 11.5 (L) 02/04/2014 1522   HCT 26.0 (L) 01/27/2017 0503   HCT 35.8 02/04/2014 1522     Per Protocol for Patient with estCrcl >30 ml/min and BMI < 40, will transition to Lovenox 40 mg Q24h     2/10 Changed back to 30 mg daily for CrCl now <30.  Bari MantisKristin Merrill PharmD Clinical Pharmacist 01/27/2017

## 2017-01-27 NOTE — Progress Notes (Signed)
[employer] - [place] at [facility]   PATIENT NAME: [name] [name]    MR#:  [id number]  DATE OF BIRTH:  [birth year]  SUBJECTIVE:  CHIEF COMPLAINT:   Chief Complaint  Patient presents with  . Fever  . Wound Check  No new complaints, wanting this issue to be taken care of while here in the hospital, sees her daughter should be here later today REVIEW OF SYSTEMS:  Review of Systems  Constitutional: Negative for chills, fever and weight loss.  HENT: Negative for nosebleeds and sore throat.   Eyes: Negative for blurred vision.  Respiratory: Negative for cough, shortness of breath and wheezing.   Cardiovascular: Negative for chest pain, orthopnea, leg swelling and PND.  Gastrointestinal: Negative for abdominal pain, constipation, diarrhea, heartburn, nausea and vomiting.  Genitourinary: Negative for dysuria and urgency.  Musculoskeletal: Positive for joint pain (left foot). Negative for back pain.  Skin: Negative for rash.  Neurological: Negative for dizziness, speech change, focal weakness and headaches.  Endo/Heme/Allergies: Does not bruise/bleed easily.  Psychiatric/Behavioral: Negative for depression.   DRUG ALLERGIES:  No Known Allergies VITALS:  Blood pressure (!) 127/53, pulse 71, temperature 97.8 F (36.6 C), temperature source Oral, resp. rate 16, weight 72.8 kg (160 lb 9.6 oz), SpO2 95 %. PHYSICAL EXAMINATION:  Physical Exam  Constitutional: She is oriented to person, place, and time and well-developed, well-nourished, and in no distress.  HENT:  Head: Normocephalic and atraumatic.  Eyes: Conjunctivae and EOM are normal. Pupils are equal, round, and reactive to light.  Neck: Normal range of motion. Neck supple. No tracheal deviation present. No thyromegaly present.  Cardiovascular: Normal rate, regular rhythm and normal heart sounds.   Pulmonary/Chest: Effort normal and breath sounds normal. No respiratory distress. She has no wheezes. She exhibits  no tenderness.  Abdominal: Soft. Bowel sounds are normal. She exhibits no distension. There is no tenderness.  Musculoskeletal: Normal range of motion.       Left foot: There is tenderness and bony tenderness.  Left heel necrotic ulcer with foul smelling drainage  Neurological: She is alert and oriented to person, place, and time. No cranial nerve deficit.  Skin: Skin is warm and dry. Lesion and rash noted.  Left heel necrotic ulcer with foul smelling drainage  Psychiatric: Mood and affect normal.   LABORATORY PANEL:   CBC  Recent Labs Lab 01/27/17 0503  WBC 9.5  HGB 8.5*  HCT 26.0*  PLT 354   ------------------------------------------------------------------------------------------------------------------ Chemistries   Recent Labs Lab 01/25/17 1227  01/27/17 0503  NA 140  < > 139  K 4.6  < > 4.3  CL 106  < > 104  CO2 28  < > 29  GLUCOSE 259*  < > 160*  BUN 44*  < > 45*  CREATININE 1.61*  < > 1.34*  CALCIUM 8.4*  < > 8.6*  AST 14*  --   --   ALT 9*  --   --   ALKPHOS 78  --   --   BILITOT 0.5  --   --   < > = values in this interval not displayed. RADIOLOGY:  Mr Foot Left Wo Contrast  Result Date: 01/26/2017 CLINICAL DATA:  Heel ulcer, 8 months. EXAM: MRI OF THE LEFT FOOT WITHOUT CONTRAST TECHNIQUE: Multiplanar, multisequence MR imaging of the ankle was performed. No intravenous contrast was administered. COMPARISON:  01/25/2017 FINDINGS: TENDONS Peroneal: Unremarkable Posteromedial: Mild flexor hallucis longus tenosynovitis proximal to the knot of Henry. Anterior: Unremarkable Achilles:  Mild distal tendinopathy. Low-level edema in the pre Achilles bursa. Plantar Fascia: Thickened medial band with adjacent edematous spur and surrounding edema. LIGAMENTS Lateral: Unremarkable Medial: Thickened superomedial portion of the spring ligament. CARTILAGE Ankle Joint: Moderate degenerative chondral thinning. Subtalar Joints/Sinus Tarsi: Prominent degenerative chondral thinning.  Bones: Chronic osteomyelitis posteriorly and inferiorly in the calcaneus, as on image 12/8. Severe overlying ulceration down to the periosteal level with adjacent gas in the soft tissues. No drainable soft tissue abscess. Other: Edema in the plantar musculature of the foot, myositis is a possibility. Edema in the distal flexor hallucis longus muscle. IMPRESSION: 1. Chronic osteomyelitis posteriorly and inferiorly in the calcaneus with severe overlying ulceration down to the periosteal margin. Adjacent gas in the soft tissues. No obvious drainable pause collection in the soft tissues. 2. Potential myositis in the distal flexor hallucis longus muscle and along the plantar musculature of the foot. Mild flexor hallucis longus tenosynovitis. 3. Mild distal Achilles tendinopathy. 4. Plantar fasciitis. 5. Thickened superomedial portion of the spring ligament. 6. Degenerative chondral thinning in the tibiotalar joint and subtalar joint. Electronically Signed   By: Gaylyn Rong M.D.   On: 01/26/2017 11:42   ASSESSMENT AND PLAN:  83 year old female with past medical history of CHF chronic diastolic, hypertension, diabetes, seizure previous CVA who presents to the hospital due to her subjective fevers and left foot ulcer.  1. Left Foot diabetic ulcer/osteomyelitis- Confirmed on MRI appreciate podiatry input.   - Continued intravenous vancomycin, Zosyn. - Await infectious disease consult.  Appreciate Vasquez surgery input.  She will likely require below-knee amputation, but waiting for family  - Await wound culture sensitivity  2. Diabetes type 2 without complication-continue Lantus, sliding scale insulin. - Diabetic nurse coordinator c/s  3. CHF-chronic diastolic CHF. -Clinically patient is not in congestive heart failure. Continue Lasix, carvedilol, lisinopril.  4. Hyperlipidemia-continue atorvastatin.  5. COPD-no acute exacerbation-continue Dulera, albuterol inhaler as needed.  6. Diabetic  neuropathy-continue gabapentin.   Please note, I called available # for Her daughter, but no luck to yet to reach her   All the records are reviewed and case discussed with Care Management/Social Worker. Management plans discussed with the patient, family and they are in agreement.  CODE STATUS: Full code  TOTAL TIME TAKING CARE OF THIS PATIENT: 35 minutes.   More than 50% of the time was spent in counseling/coordination of care: YES  POSSIBLE D/C IN 3-4 DAYS, DEPENDING ON CLINICAL CONDITION.  And podiatry, vascular surgery and infectious disease workup   Delfino Lovett M.D on 01/27/2017 at 8:09 AM  Between 7am to 6pm - Pager - [phone]  After 6pm go to [email or web address] - Social research officer, government  [facility]  Office  [phone]  CC: Primary care physician; Barbette Reichmann, MD  Note: This dictation was prepared with Dragon dictation along with smaller phrase technology. Any transcriptional errors that result from this process are unintentional.

## 2017-01-28 LAB — GLUCOSE, CAPILLARY
Glucose-Capillary: 171 mg/dL — ABNORMAL HIGH (ref 65–99)
Glucose-Capillary: 163 mg/dL — ABNORMAL HIGH (ref 65–99)
Glucose-Capillary: 195 mg/dL — ABNORMAL HIGH (ref 65–99)
Glucose-Capillary: 300 mg/dL — ABNORMAL HIGH (ref 65–99)

## 2017-01-28 LAB — VANCOMYCIN, RANDOM: Vancomycin Rm: 19

## 2017-01-28 LAB — BASIC METABOLIC PANEL
Anion gap: 7 (ref 5–15)
BUN: 51 mg/dL — ABNORMAL HIGH (ref 6–20)
CALCIUM: 8.4 mg/dL — AB (ref 8.9–10.3)
CO2: 28 mmol/L (ref 22–32)
CREATININE: 1.67 mg/dL — AB (ref 0.44–1.00)
Chloride: 101 mmol/L (ref 101–111)
GFR calc Af Amer: 32 mL/min — ABNORMAL LOW (ref >60)
GFR calc non Af Amer: 28 mL/min — ABNORMAL LOW (ref >60)
GLUCOSE: 179 mg/dL — AB (ref 65–99)
Potassium: 4.2 mmol/L (ref 3.5–5.1)
Sodium: 136 mmol/L (ref 135–145)

## 2017-01-28 MED ORDER — INSULIN ASPART 100 UNIT/ML ~~LOC~~ SOLN
5.0000 [IU] | Freq: Three times a day (TID) | SUBCUTANEOUS | Status: DC
  Administered 2017-01-28 – 2017-02-04 (×18): 5 [IU] via SUBCUTANEOUS
  Filled 2017-01-28 (×19): qty 5

## 2017-01-28 MED ORDER — ENOXAPARIN SODIUM 30 MG/0.3ML ~~LOC~~ SOLN
30.0000 mg | SUBCUTANEOUS | Status: DC
  Administered 2017-01-28 – 2017-01-30 (×3): 30 mg via SUBCUTANEOUS
  Filled 2017-01-28 (×3): qty 0.3

## 2017-01-28 MED ORDER — INSULIN GLARGINE 100 UNIT/ML ~~LOC~~ SOLN
18.0000 [IU] | Freq: Every day | SUBCUTANEOUS | Status: DC
  Administered 2017-01-28 – 2017-02-04 (×7): 18 [IU] via SUBCUTANEOUS
  Filled 2017-01-28 (×9): qty 0.18

## 2017-01-28 MED ORDER — VANCOMYCIN HCL IN DEXTROSE 750-5 MG/150ML-% IV SOLN
750.0000 mg | INTRAVENOUS | Status: DC
  Administered 2017-01-28: 750 mg via INTRAVENOUS
  Filled 2017-01-28 (×2): qty 150

## 2017-01-28 MED ORDER — SENNOSIDES-DOCUSATE SODIUM 8.6-50 MG PO TABS
2.0000 | ORAL_TABLET | Freq: Two times a day (BID) | ORAL | Status: DC
  Administered 2017-01-28 (×2): 2 via ORAL
  Filled 2017-01-28 (×2): qty 2

## 2017-01-28 NOTE — Clinical Social Work Note (Signed)
CSW received verbal consult from unit secretary that the patient's sisters wanted to speak with CSW about SNF placement and dc planning. The CSW met with the sisters and explained the plan due to insurance stipulations for 60 well days. The sisters verbalized understanding that the plan is for the patient to return home at dc with home health. CSW advised that if they are concerned about 24 hour care, private hiring of a CNA or caregiver may be the better option. CSW will con't to follow for dc planning with RNCM.  Santiago Bumpers, MSW, Latanya Presser [phone]

## 2017-01-28 NOTE — Progress Notes (Signed)
Physical Therapy Treatment Patient Details Name: [name] [name] MRN: [id number] DOB: 12/15/1934 Today's Date: 01/28/2017    History of Present Illness 83 year old female with past medical history of CHF chronic diastolic, hypertension, diabetes, seizure previous CVA who presents to the hospital due to her subjective fevers and left foot ulcer. L foot MRI showed chronic osteomyelitis posteriorly and inferiorly in the calcaneus. Unclear at this time if pt will require surgical management. Pt will be maintained NWB on LLE during PT evaluation. Pt is AOx1 at time of evaluation so unclear accuracy of history    PT Comments    Pt lethargic, but able to awaken through voice and agreeable to PT. Pt reports pain in back; denies pain in left foot. Pt participates in supine exercises with assist as needed. Throughout, range limited and endurance poor; pt falls asleep several times requiring need to reawaken. Pt does not wish to get up in bed; pt was repositioned in a more reclined position with much improved comfort in pt's back and pillow placed under right upper extremity to prevent excess R lean. Continue PT to progress strength and endurance to improve functional mobility.   Follow Up Recommendations  SNF     Equipment Recommendations  None recommended by PT    Recommendations for Other Services       Precautions / Restrictions Precautions Precautions: Fall Restrictions Weight Bearing Restrictions: Yes LLE Weight Bearing: Non weight bearing    Mobility  Bed Mobility               General bed mobility comments: Not tested due to level of lethargy, and pt not wishing to be up in bed  Transfers                    Ambulation/Gait                 Stairs            Wheelchair Mobility    Modified Rankin (Stroke Patients Only)       Balance                                    Cognition Arousal/Alertness: Lethargic (awakens with voice  for participation. Alternates wake/sleep) Behavior During Therapy: WFL for tasks assessed/performed Overall Cognitive Status: Within Functional Limits for tasks assessed                      Exercises General Exercises - Lower Extremity Ankle Circles/Pumps: AROM;Both;10 reps;Supine Quad Sets: Strengthening;Both;10 reps;Supine Gluteal Sets: Other (comment) (Attempted; pt feels she is performing, but no movement ) Short Arc Quad: AROM;Both;10 reps;Supine Heel Slides: AAROM;Both;10 reps;Supine Hip ABduction/ADduction: AAROM;Both;10 reps;Supine Straight Leg Raises: AAROM;Both;10 reps;Supine    General Comments        Pertinent Vitals/Pain Pain Assessment:  (denies pain L foot; c/o pain in back. Repositioned in bed )    Home Living                      Prior Function            PT Goals (current goals can now be found in the care plan section) Progress towards PT goals: Progressing toward goals (slowly)    Frequency    Min 2X/week      PT Plan Current plan remains appropriate    Co-evaluation  End of Session   Activity Tolerance: Patient limited by fatigue;Patient limited by lethargy Patient left: in bed;with call bell/phone within reach;with bed alarm set;Other (comment) (pressure relief boot on B)     Time: 1610-9604 PT Time Calculation (min) (ACUTE ONLY): 19 min  Charges:  $Therapeutic Exercise: 8-22 mins                    G Codes:      Scot Dock, PTA 01/28/2017, 12:15 PM

## 2017-01-28 NOTE — Progress Notes (Addendum)
[employer] - [place] at [facility]   PATIENT NAME: [name] [name]    MR#:  [id number]  DATE OF BIRTH:  09-19-1934  SUBJECTIVE:  CHIEF COMPLAINT:   Chief Complaint  Patient presents with  . Fever  . Wound Check  No new complaints, sleepy REVIEW OF SYSTEMS:  Review of Systems  Constitutional: Negative for chills, fever and weight loss.  HENT: Negative for nosebleeds and sore throat.   Eyes: Negative for blurred vision.  Respiratory: Negative for cough, shortness of breath and wheezing.   Cardiovascular: Negative for chest pain, orthopnea, leg swelling and PND.  Gastrointestinal: Negative for abdominal pain, constipation, diarrhea, heartburn, nausea and vomiting.  Genitourinary: Negative for dysuria and urgency.  Musculoskeletal: Positive for joint pain (left foot). Negative for back pain.  Skin: Negative for rash.  Neurological: Negative for dizziness, speech change, focal weakness and headaches.  Endo/Heme/Allergies: Does not bruise/bleed easily.  Psychiatric/Behavioral: Negative for depression.   DRUG ALLERGIES:  No Known Allergies VITALS:  Blood pressure (!) 90/55, pulse 71, temperature 98.1 F (36.7 C), resp. rate 18, height 5\' 3"  (1.6 m), weight 72.8 kg (160 lb 9.6 oz), SpO2 94 %. PHYSICAL EXAMINATION:  Physical Exam  Constitutional: She is oriented to person, place, and time and well-developed, well-nourished, and in no distress.  HENT:  Head: Normocephalic and atraumatic.  Eyes: Conjunctivae and EOM are normal. Pupils are equal, round, and reactive to light.  Neck: Normal range of motion. Neck supple. No tracheal deviation present. No thyromegaly present.  Cardiovascular: Normal rate, regular rhythm and normal heart sounds.   Pulmonary/Chest: Effort normal and breath sounds normal. No respiratory distress. She has no wheezes. She exhibits no tenderness.  Abdominal: Soft. Bowel sounds are normal. She exhibits no distension. There is no tenderness.   Musculoskeletal: Normal range of motion.       Left foot: There is tenderness and bony tenderness.  Left heel necrotic ulcer with foul smelling drainage  Neurological: She is alert and oriented to person, place, and time. No cranial nerve deficit.  Skin: Skin is warm and dry. Lesion and rash noted.  Left heel necrotic ulcer with foul smelling drainage  Psychiatric: Mood and affect normal.   LABORATORY PANEL:   CBC  Recent Labs Lab 01/27/17 0503  WBC 9.5  HGB 8.5*  HCT 26.0*  PLT 354   ------------------------------------------------------------------------------------------------------------------ Chemistries   Recent Labs Lab 01/25/17 1227  01/28/17 0317  NA 140  < > 136  K 4.6  < > 4.2  CL 106  < > 101  CO2 28  < > 28  GLUCOSE 259*  < > 179*  BUN 44*  < > 51*  CREATININE 1.61*  < > 1.67*  CALCIUM 8.4*  < > 8.4*  AST 14*  --   --   ALT 9*  --   --   ALKPHOS 78  --   --   BILITOT 0.5  --   --   < > = values in this interval not displayed. RADIOLOGY:  No results found. ASSESSMENT AND PLAN:  83 year old female with past medical history of CHF chronic diastolic, hypertension, diabetes, seizure previous CVA who presents to the hospital due to her subjective fevers and left foot ulcer.  1. Left Foot diabetic ulcer/osteomyelitis- Confirmed on MRI appreciate podiatry input.   - Continued intravenous Zosyn. vanco stopped - Await infectious disease consult.  Appreciate Vascular surgery input.  She will likely require below-knee amputation, but waiting for family decision -  considering it and seem to be amendable to it per ID discussion yesterday - Await wound culture sensitivity  * Hypotension - stop coreg and lisinopril, continue lasix  2. Diabetes type 2 without complication-continue Lantus (increase to 15 units), Aspart increase to 5 units TID. sliding scale insulin. - Diabetic nurse coordinator c/s  3. CHF-chronic diastolic CHF. -Clinically patient is not in  congestive heart failure. Continue Lasix, carvedilol, lisinopril.  4. Hyperlipidemia-continue atorvastatin.  5. COPD-no acute exacerbation-continue Dulera, albuterol inhaler as needed.  6. Diabetic neuropathy-continue gabapentin.   All the records are reviewed and case discussed with Care Management/Social Worker. Management plans discussed with the patient, RN and they are in agreement.  CODE STATUS: Full code  TOTAL TIME TAKING CARE OF THIS PATIENT: 35 minutes.   More than 50% of the time was spent in counseling/coordination of care: YES  POSSIBLE D/C IN 3-4 DAYS, DEPENDING ON CLINICAL CONDITION.  And podiatry, vascular surgery and infectious disease workup   Delfino LovettVipul Aven Cegielski M.D on 01/28/2017 at 7:34 AM  Between 7am to 6pm - Pager - [phone]  After 6pm go to [email or web address] - Social research officer, governmentpassword EPAS [facility]  [facility] [place] [facility]  Office  [phone]  CC: Primary care physician; Barbette ReichmannHANDE,VISHWANATH, MD  Note: This dictation was prepared with Dragon dictation along with smaller phrase technology. Any transcriptional errors that result from this process are unintentional.

## 2017-01-28 NOTE — Progress Notes (Signed)
Patient Demographics  [name] [name], is a 83 y.o. female   MRN: [id number]   DOB - [birth year]  Admit Date - 01/25/2017    Outpatient Primary MD for the patient is Barbette Reichmann, MD  With History of -  Past Medical History:  Diagnosis Date  . Diabetes mellitus without complication (HCC)   . Hypertension   . Stroke [facility])       Past Surgical History:  Procedure Laterality Date  . PERIPHERAL VASCULAR CATHETERIZATION N/A 11/14/2016   Procedure: Lower Extremity Angiography;  Surgeon: Annice Needy, MD;  Location: [facility] INVASIVE CV LAB;  Service: Cardiovascular;  Laterality: N/A;  . PERIPHERAL VASCULAR CATHETERIZATION Right 11/21/2016   Procedure: Lower Extremity Angiography;  Surgeon: Annice Needy, MD;  Location: [facility] INVASIVE CV LAB;  Service: Cardiovascular;  Laterality: Right;  . PERIPHERAL VASCULAR CATHETERIZATION N/A 11/21/2016   Procedure: Abdominal Aortogram w/Lower Extremity;  Surgeon: Annice Needy, MD;  Location: [facility] INVASIVE CV LAB;  Service: Cardiovascular;  Laterality: N/A;  . PERIPHERAL VASCULAR CATHETERIZATION  11/21/2016   Procedure: Lower Extremity Intervention;  Surgeon: Annice Needy, MD;  Location: [facility] INVASIVE CV LAB;  Service: Cardiovascular;;    in for   Chief Complaint  Patient presents with  . Fever  . Wound Check     HPI  [name] [name]  is a 83 y.o. female, Chronic nonhealing decubitus ulcer to the posterior left heel    Review of Systems    In addition to the HPI above,  No Fever-chills, No Headache, No changes with Vision or hearing, No problems swallowing food or Liquids, No Chest pain, Cough or Shortness of Breath, No Abdominal pain, No Nausea or Vommitting, Bowel movements are regular, No Blood in stool or Urine, No dysuria, No new skin rashes or bruises, No new  joints pains-aches,  No new weakness, tingling, numbness in any extremity, No recent weight gain or loss, No polyuria, polydypsia or polyphagia, No significant Mental Stressors.  A full 10 point Review of Systems was done, except as stated above, all other Review of Systems were negative.   Social History Social History  Substance Use Topics  . Smoking status: Former Smoker    Types: Cigarettes  . Smokeless tobacco: Never Used  . Alcohol use No     Family History Family History  Problem Relation Age of Onset  . Stroke Mother   . Stroke Father   . Cancer Father   . Heart disease Paternal Grandfather   . Diabetes Sister   . Diabetes Brother      Prior to Admission medications   Medication Sig Start Date End Date Taking? Authorizing Provider  acetaminophen (TYLENOL) 325 MG tablet Take 2 tablets (650 mg total) by mouth every 6 (six) hours as needed for mild pain (temp > 101.5). 11/23/16  Yes Ramonita Lab, MD  albuterol (PROVENTIL HFA;VENTOLIN HFA) 108 (90 Base) MCG/ACT inhaler Inhale 2 puffs into the lungs every 6 (six) hours as needed for wheezing or shortness of breath. 11/23/16  Yes Ramonita Lab, MD  aspirin 81 MG tablet Chew 81 mg by mouth daily.   Yes Historical Provider, MD  atorvastatin (LIPITOR) 10 MG tablet Take 1 tablet  by mouth at bedtime.  03/26/16  Yes Historical Provider, MD  carvedilol (COREG) 3.125 MG tablet Take 1 tablet (3.125 mg total) by mouth 2 (two) times daily with a meal. 09/17/16  Yes Katha Hamming, MD  clopidogrel (PLAVIX) 75 MG tablet Take 1 tablet (75 mg total) by mouth daily. 11/16/16  Yes Enedina Finner, MD  collagenase (SANTYL) ointment Apply topically daily. 11/15/16  Yes Enedina Finner, MD  diphenhydrAMINE (BENADRYL) 25 mg capsule Take 25 mg by mouth every 6 (six) hours as needed for itching.   Yes Historical Provider, MD  fluticasone-salmeterol (ADVAIR HFA) 115-21 MCG/ACT inhaler Inhale 2 puffs into the lungs 2 (two) times daily. 11/23/16  Yes Ramonita Lab,  MD  furosemide (LASIX) 20 MG tablet Take 1 tablet (20 mg total) by mouth 2 (two) times daily. 11/23/16  Yes Ramonita Lab, MD  gabapentin (NEURONTIN) 100 MG capsule Take 2 capsules by mouth 2 (two) times daily. Take 2 capsules (200mg ) twice a day 02/23/16  Yes Historical Provider, MD  insulin aspart (NOVOLOG) 100 UNIT/ML injection Inject 0-15 Units into the skin 3 (three) times daily with meals. 11/23/16  Yes Ramonita Lab, MD  insulin glargine (LANTUS) 100 UNIT/ML injection Inject 0.1 mLs (10 Units total) into the skin daily. 12/29/16  Yes Enedina Finner, MD  lisinopril (PRINIVIL,ZESTRIL) 2.5 MG tablet Take 1 tablet (2.5 mg total) by mouth daily. 09/18/16  Yes Katha Hamming, MD  loratadine (CLARITIN) 10 MG tablet Take 10 mg by mouth daily.   Yes Historical Provider, MD  potassium chloride SA (K-DUR,KLOR-CON) 20 MEQ tablet Take 20 mEq by mouth daily.   Yes Historical Provider, MD  Vitamin D, Ergocalciferol, (DRISDOL) 50000 units CAPS capsule Take 1 capsule by mouth once a week. 03/26/16  Yes Historical Provider, MD    Anti-infectives    Start     Dose/Rate Route Frequency Ordered Stop   01/27/17 1300  vancomycin (VANCOCIN) IVPB 750 mg/150 ml premix     750 mg 150 mL/hr over 60 Minutes Intravenous Every 24 hours 01/27/17 1140     01/26/17 0600  piperacillin-tazobactam (ZOSYN) IVPB 3.375 g     3.375 g 12.5 mL/hr over 240 Minutes Intravenous Every 8 hours 01/25/17 1518     01/26/17 0200  vancomycin (VANCOCIN) 1,250 mg in sodium chloride 0.9 % 250 mL IVPB  Status:  Discontinued     1,250 mg 166.7 mL/hr over 90 Minutes Intravenous Every 36 hours 01/25/17 1518 01/27/17 1140   01/25/17 1345  vancomycin (VANCOCIN) 1,250 mg in sodium chloride 0.9 % 250 mL IVPB     1,250 mg 166.7 mL/hr over 90 Minutes Intravenous  Once 01/25/17 1334 01/25/17 1630   01/25/17 1345  levofloxacin (LEVAQUIN) IVPB 750 mg     750 mg 100 mL/hr over 90 Minutes Intravenous  Once 01/25/17 1334 01/25/17 1630   01/25/17 1300  ceFEPIme  (MAXIPIME) 2 GM / 50mL IVPB premix     2 g 100 mL/hr over 30 Minutes Intravenous  Once 01/25/17 1248 01/25/17 1359   01/25/17 1300  vancomycin (VANCOCIN) IVPB 1000 mg/200 mL premix  Status:  Discontinued     1,000 mg 200 mL/hr over 60 Minutes Intravenous  Once 01/25/17 1248 01/25/17 1340      CBC  Recent Labs Lab 01/25/17 1227 01/26/17 0444 01/27/17 0503  WBC 7.1 4.8 9.5  HGB 7.6* 8.0* 8.5*  HCT 22.8* 25.1* 26.0*  PLT 313 322 354  MCV 81.7 81.9 81.8  MCH 27.2 26.2 26.8  MCHC 33.3 31.9* 32.7  RDW 20.2* 19.7* 20.0*  LYMPHSABS 0.9*  --   --   MONOABS 0.2  --   --   EOSABS 0.1  --   --   BASOSABS 0.0  --   --    ------------------------------------------------------------------------------------------------------------------  Chemistries   Recent Labs Lab 01/25/17 1227 01/26/17 0444 01/27/17 0503 01/27/17 1833 01/28/17 0317  NA 140 136 139  --  136  K 4.6 5.1 4.3  --  4.2  CL 106 103 104  --  101  CO2 28 27 29   --  28  GLUCOSE 259* 355* 160* 445* 179*  BUN 44* 47* 45*  --  51*  CREATININE 1.61* 1.48* 1.34*  --  1.67*  CALCIUM 8.4* 8.6* 8.6*  --  8.4*  AST 14*  --   --   --   --   ALT 9*  --   --   --   --   ALKPHOS 78  --   --   --   --   BILITOT 0.5  --   --   --   --    -------------------------------------------------------------------------------------------------------------------------------------------------------------------------------------------------------------------  Imaging results:   Mr Foot Left Wo Contrast  Result Date: 01/26/2017 CLINICAL DATA:  Heel ulcer, 8 months. EXAM: MRI OF THE LEFT FOOT WITHOUT CONTRAST TECHNIQUE: Multiplanar, multisequence MR imaging of the ankle was performed. No intravenous contrast was administered. COMPARISON:  01/25/2017 FINDINGS: TENDONS Peroneal: Unremarkable Posteromedial: Mild flexor hallucis longus tenosynovitis proximal to the knot of Henry. Anterior: Unremarkable Achilles: Mild distal tendinopathy. Low-level  edema in the pre Achilles bursa. Plantar Fascia: Thickened medial band with adjacent edematous spur and surrounding edema. LIGAMENTS Lateral: Unremarkable Medial: Thickened superomedial portion of the spring ligament. CARTILAGE Ankle Joint: Moderate degenerative chondral thinning. Subtalar Joints/Sinus Tarsi: Prominent degenerative chondral thinning. Bones: Chronic osteomyelitis posteriorly and inferiorly in the calcaneus, as on image 12/8. Severe overlying ulceration down to the periosteal level with adjacent gas in the soft tissues. No drainable soft tissue abscess. Other: Edema in the plantar musculature of the foot, myositis is a possibility. Edema in the distal flexor hallucis longus muscle. IMPRESSION: 1. Chronic osteomyelitis posteriorly and inferiorly in the calcaneus with severe overlying ulceration down to the periosteal margin. Adjacent gas in the soft tissues. No obvious drainable pause collection in the soft tissues. 2. Potential myositis in the distal flexor hallucis longus muscle and along the plantar musculature of the foot. Mild flexor hallucis longus tenosynovitis. 3. Mild distal Achilles tendinopathy. 4. Plantar fasciitis. 5. Thickened superomedial portion of the spring ligament. 6. Degenerative chondral thinning in the tibiotalar joint and subtalar joint. Electronically Signed   By: Gaylyn Rong M.D.   On: 01/26/2017 11:42    Assessment & Plan: Large tissue necrosis to the posterior heel including Achilles tendon and heel bone with osteomyelitis throughout the calcaneus. This has a poor prognosis to heal with any type of surgical attempts on the area itself and will likely benefit most from a below-knee amputation. Plan: I discussed this with both vascular as well as infectious disease and they're in agreement with this course of action. I did finally get a chance to talk to Kennith Gain Mrs. Erlinda Hong Tillman's daughter today. Dr. Sampson Goon was able to talk to her brother yesterday as the  patient's brother. In both cases we explained the severity of the patient's problem and the likely lack of the ability to heal from localized treatment. Explained that with a below-knee amputation she would have a better chance to heal and actually  reduce risk for further infection and damage. After explaining this to the daughter she seemed to have an understanding of it. Right now I will order daily dressing changes but will assess for care to vascular surgery for potential BKA.  Active Problems:   Acute osteomyelitis of left foot (HCC)   Pressure ulcer of left heel, stage 4 (HCC)  Family Communication: Plan discussed with patient and family  Epimenio SarinROXLER,Lonald Troiani G M.D on 01/28/2017 at 8:51 AM

## 2017-01-28 NOTE — Progress Notes (Signed)
Pharmacy Antibiotic Note  [name] [name] is a 83 y.o. female admitted on 01/25/2017 with Osteomyelitis.  Pharmacy has been consulted for Vancomycin and Zosyn dosing.  Patient received 1 dose  Vancomycin 1250mg , cefepime and levofloxacin in ED.   Plan: Ke: 0.028   T1/2: 25   Vd: 51  Will start the patient on Vancomycin 1250mg  IV every 36 hours with 12 hour stack dosing. Calculated trough at Css is 18. Plan for trough prior to 4th dose. Monitor renal function daily and adjust dose if needed.   Will start patient on Zosyn 3.375 IV EI every 8 hours.   2/9: Scr improved. Will adjust Vancomycin to 750mg  Q24h. Will check trough on 2/11. Goal 15-20 mcg/ml.    2/10: Scr diminished; however random level Vancomycin resulted @ 19. Will continue current regimen.     Height: 5\' 3"  (160 cm) Weight: 160 lb 9.6 oz (72.8 kg) IBW/kg (Calculated) : 52.4  Temp (24hrs), Avg:98.2 F (36.8 C), Min:97.8 F (36.6 C), Max:98.9 F (37.2 C)   Recent Labs Lab 01/25/17 1226 01/25/17 1227 01/25/17 1743 01/26/17 0444 01/27/17 0503 01/28/17 0317 01/28/17 1258  WBC  --  7.1  --  4.8 9.5  --   --   CREATININE  --  1.61*  --  1.48* 1.34* 1.67*  --   LATICACIDVEN 0.8  --  0.9  --   --   --   --   VANCORANDOM  --   --   --   --   --   --  19    Estimated Creatinine Clearance: 25.7 mL/min (by C-G formula based on SCr of 1.67 mg/dL (H)).    No Known Allergies  Antimicrobials this admission: 2/7 Levofloxacin/cefepime x 1 dose 2/7 Vancomycin >> 2/8 Zosyn  >>   Dose adjustments this admission: 2/9  Vanc 1250mg  q36h changed to 750mg  q24h  Microbiology results: 2/7 BCx: sent Wound cx  Thank you for allowing pharmacy to be a part of this patient's care.  Demetrius CharityJames,Solara Goodchild D, PharmD, BCPS Clinical Pharmacist 01/28/2017 2:51 PM

## 2017-01-29 LAB — GLUCOSE, CAPILLARY
GLUCOSE-CAPILLARY: 230 mg/dL — AB (ref 65–99)
GLUCOSE-CAPILLARY: 138 mg/dL — AB (ref 65–99)
Glucose-Capillary: 250 mg/dL — ABNORMAL HIGH (ref 65–99)
Glucose-Capillary: 239 mg/dL — ABNORMAL HIGH (ref 65–99)

## 2017-01-29 LAB — GASTROINTESTINAL PANEL BY PCR, STOOL (REPLACES STOOL CULTURE)
ADENOVIRUS F40/41: NOT DETECTED
ASTROVIRUS: NOT DETECTED
CAMPYLOBACTER SPECIES: NOT DETECTED
CYCLOSPORA CAYETANENSIS: NOT DETECTED
Cryptosporidium: NOT DETECTED
ENTEROPATHOGENIC E COLI (EPEC): NOT DETECTED
ENTEROTOXIGENIC E COLI (ETEC): NOT DETECTED
Entamoeba histolytica: NOT DETECTED
Enteroaggregative E coli (EAEC): NOT DETECTED
Giardia lamblia: NOT DETECTED
Norovirus GI/GII: NOT DETECTED
PLESIMONAS SHIGELLOIDES: NOT DETECTED
ROTAVIRUS A: NOT DETECTED
SAPOVIRUS (I, II, IV, AND V): NOT DETECTED
SHIGA LIKE TOXIN PRODUCING E COLI (STEC): NOT DETECTED
Salmonella species: NOT DETECTED
Shigella/Enteroinvasive E coli (EIEC): NOT DETECTED
Vibrio cholerae: NOT DETECTED
Vibrio species: NOT DETECTED
Yersinia enterocolitica: NOT DETECTED

## 2017-01-29 LAB — BASIC METABOLIC PANEL
ANION GAP: 5 (ref 5–15)
BUN: 49 mg/dL — AB (ref 6–20)
CHLORIDE: 104 mmol/L (ref 101–111)
CO2: 29 mmol/L (ref 22–32)
Calcium: 8.4 mg/dL — ABNORMAL LOW (ref 8.9–10.3)
Creatinine, Ser: 1.52 mg/dL — ABNORMAL HIGH (ref 0.44–1.00)
GFR calc Af Amer: 36 mL/min — ABNORMAL LOW (ref >60)
GFR, EST NON AFRICAN AMERICAN: 31 mL/min — AB (ref >60)
GLUCOSE: 281 mg/dL — AB (ref 65–99)
POTASSIUM: 4.2 mmol/L (ref 3.5–5.1)
SODIUM: 138 mmol/L (ref 135–145)

## 2017-01-29 LAB — CBC
HCT: 26.7 % — ABNORMAL LOW (ref 35.0–47.0)
HEMOGLOBIN: 8.7 g/dL — AB (ref 12.0–16.0)
MCH: 26.7 pg (ref 26.0–34.0)
MCHC: 32.6 g/dL (ref 32.0–36.0)
MCV: 82 fL (ref 80.0–100.0)
PLATELETS: 351 10*3/uL (ref 150–440)
RBC: 3.26 MIL/uL — AB (ref 3.80–5.20)
RDW: 20.3 % — ABNORMAL HIGH (ref 11.5–14.5)
WBC: 5.6 10*3/uL (ref 3.6–11.0)

## 2017-01-29 LAB — BLOOD GAS, VENOUS
Acid-Base Excess: 3.4 mmol/L — ABNORMAL HIGH (ref 0.0–2.0)
Bicarbonate: 30.4 mmol/L — ABNORMAL HIGH (ref 20.0–28.0)
Patient temperature: 37
pCO2, Ven: 55 mmHg (ref 44.0–60.0)
pH, Ven: 7.35 (ref 7.250–7.430)

## 2017-01-29 LAB — VANCOMYCIN, TROUGH: VANCOMYCIN TR: 22 ug/mL — AB (ref 15–20)

## 2017-01-29 MED ORDER — VANCOMYCIN HCL IN DEXTROSE 750-5 MG/150ML-% IV SOLN
750.0000 mg | INTRAVENOUS | Status: DC
  Administered 2017-01-29: 750 mg via INTRAVENOUS
  Filled 2017-01-29 (×2): qty 150

## 2017-01-29 NOTE — Progress Notes (Addendum)
[facility] - [place] at [facility]   PATIENT NAME: [name] [name]    MR#:  [id number]  DATE OF BIRTH:  01-28-1934  SUBJECTIVE:  CHIEF COMPLAINT:   Chief Complaint  Patient presents with  . Fever  . Wound Check  feels her bowel/stomach is messed up, had 2-3 loose BMs,  REVIEW OF SYSTEMS:  Review of Systems  Constitutional: Negative for chills, fever and weight loss.  HENT: Negative for nosebleeds and sore throat.   Eyes: Negative for blurred vision.  Respiratory: Negative for cough, shortness of breath and wheezing.   Cardiovascular: Negative for chest pain, orthopnea, leg swelling and PND.  Gastrointestinal: Positive for abdominal pain and diarrhea. Negative for constipation, heartburn, nausea and vomiting.  Genitourinary: Negative for dysuria and urgency.  Musculoskeletal: Positive for joint pain (left foot). Negative for back pain.  Skin: Negative for rash.  Neurological: Negative for dizziness, speech change, focal weakness and headaches.  Endo/Heme/Allergies: Does not bruise/bleed easily.  Psychiatric/Behavioral: Negative for depression.   DRUG ALLERGIES:  No Known Allergies VITALS:  Blood pressure (!) 111/46, pulse 81, temperature 99 F (37.2 C), temperature source Oral, resp. rate 18, height 5\' 3"  (1.6 m), weight 72.8 kg (160 lb 9.6 oz), SpO2 94 %. PHYSICAL EXAMINATION:  Physical Exam  Constitutional: She is oriented to person, place, and time and well-developed, well-nourished, and in no distress.  HENT:  Head: Normocephalic and atraumatic.  Eyes: Conjunctivae and EOM are normal. Pupils are equal, round, and reactive to light.  Neck: Normal range of motion. Neck supple. No tracheal deviation present. No thyromegaly present.  Cardiovascular: Normal rate, regular rhythm and normal heart sounds.   Pulmonary/Chest: Effort normal and breath sounds normal. No respiratory distress. She has no wheezes. She exhibits no tenderness.  Abdominal: Soft.  Bowel sounds are normal. She exhibits no distension. There is no tenderness.  Musculoskeletal: Normal range of motion.       Left foot: There is tenderness and bony tenderness.  Left heel necrotic ulcer with foul smelling drainage  Neurological: She is alert and oriented to person, place, and time. No cranial nerve deficit.  Skin: Skin is warm and dry. Lesion and rash noted.  Left heel necrotic ulcer with foul smelling drainage  Psychiatric: Mood and affect normal.   LABORATORY PANEL:   CBC  Recent Labs Lab 01/29/17 0328  WBC 5.6  HGB 8.7*  HCT 26.7*  PLT 351   ------------------------------------------------------------------------------------------------------------------ Chemistries   Recent Labs Lab 01/25/17 1227  01/29/17 0328  NA 140  < > 138  K 4.6  < > 4.2  CL 106  < > 104  CO2 28  < > 29  GLUCOSE 259*  < > 281*  BUN 44*  < > 49*  CREATININE 1.61*  < > 1.52*  CALCIUM 8.4*  < > 8.4*  AST 14*  --   --   ALT 9*  --   --   ALKPHOS 78  --   --   BILITOT 0.5  --   --   < > = values in this interval not displayed. RADIOLOGY:  No results found. ASSESSMENT AND PLAN:  [age] year old female with past medical history of CHF chronic diastolic, hypertension, diabetes, seizure previous CVA who presents to the hospital due to her subjective fevers and left foot ulcer.  * Left Foot diabetic ulcer/osteomyelitis- Confirmed on MRI appreciate podiatry input.   - Continued intravenous Zosyn. vanco  - Await infectious disease consult.  Appreciate Vascular surgery input.  She will likely require below-knee amputation, but waiting for family decision - considering it and seem to be amendable to it per ID discussion yesterday - Await wound culture sensitivity  * Diarrhea - could be due to senokot, she received 2 tabs last night and had 2-3 loose BMs, hold off c.diff testing. - check stool GI panel, can't r/o c.diff as she is also on Abx but stop laxatives first and see if symptoms  improve.  * Hypotension - stop coreg and lisinopril, continue lasix  2. Diabetes type 2 without complication-continue Lantus (increase to 15 units), Aspart increase to 5 units TID. sliding scale insulin. - Diabetic nurse coordinator c/s  3. CHF-chronic diastolic CHF. -Clinically patient is not in congestive heart failure. Continue Lasix, carvedilol, lisinopril.  4. Hyperlipidemia-continue atorvastatin.  5. COPD-no acute exacerbation-continue Dulera, albuterol inhaler as needed.  6. Diabetic neuropathy-continue gabapentin.   All the records are reviewed and case discussed with Care Management/Social Worker. Management plans discussed with the patient, RN and they are in agreement.  CODE STATUS: Full code  TOTAL TIME TAKING CARE OF THIS PATIENT: 35 minutes.   More than 50% of the time was spent in counseling/coordination of care: YES  POSSIBLE D/C IN 3-4 DAYS, DEPENDING ON CLINICAL CONDITION.  And podiatry, vascular surgery and infectious disease workup   Delfino Lovett M.D on 01/29/2017 at 9:06 AM  Between 7am to 6pm - Pager - [phone]  After 6pm go to [email or web address] - Social research officer, government  [facility] [place] [facility]  Office  [phone]  CC: Primary care physician; Barbette Reichmann, MD  Note: This dictation was prepared with Dragon dictation along with smaller phrase technology. Any transcriptional errors that result from this process are unintentional.

## 2017-01-29 NOTE — Progress Notes (Signed)
Pharmacy Antibiotic Note  [name] [name] is a 83 y.o. female admitted on 01/25/2017 with Osteomyelitis.  Pharmacy has been consulted for Vancomycin and Zosyn dosing.  Patient received 1 dose  Vancomycin 1250mg , cefepime and levofloxacin in ED.   Plan: Ke: 0.028   T1/2: 25   Vd: 51  Will start the patient on Vancomycin 1250mg  IV every 36 hours with 12 hour stack dosing. Calculated trough at Css is 18. Plan for trough prior to 4th dose. Monitor renal function daily and adjust dose if needed.   Will start patient on Zosyn 3.375 IV EI every 8 hours.   2/9: Scr improved. Will adjust Vancomycin to 750mg  Q24h. Will check trough on 2/11. Goal 15-20 mcg/ml.    2/11: Vancomycin level drawn ~19 hours post dose (5 hours post dose) and resulted @ 22; therefore will continue current regimen.     Height: 5\' 3"  (160 cm) Weight: 160 lb 9.6 oz (72.8 kg) IBW/kg (Calculated) : 52.4  Temp (24hrs), Avg:98.8 F (37.1 C), Min:98.3 F (36.8 C), Max:99 F (37.2 C)   Recent Labs Lab 01/25/17 1226 01/25/17 1227 01/25/17 1743 01/26/17 0444 01/27/17 0503 01/28/17 0317 01/28/17 1258 01/29/17 0328 01/29/17 1219  WBC  --  7.1  --  4.8 9.5  --   --  5.6  --   CREATININE  --  1.61*  --  1.48* 1.34* 1.67*  --  1.52*  --   LATICACIDVEN 0.8  --  0.9  --   --   --   --   --   --   VANCOTROUGH  --   --   --   --   --   --   --   --  22*  VANCORANDOM  --   --   --   --   --   --  19  --   --     Estimated Creatinine Clearance: 28.2 mL/min (by C-G formula based on SCr of 1.52 mg/dL (H)).    No Known Allergies  Antimicrobials this admission: 2/7 Levofloxacin/cefepime x 1 dose 2/7 Vancomycin >> 2/8 Zosyn  >>   Dose adjustments this admission: 2/9  Vanc 1250mg  q36h changed to 750mg  q24h  Microbiology results: 2/7 BCx: sent Wound cx  Thank you for allowing pharmacy to be a part of this patient's care.  Demetrius CharityJames,Dalonte Hardage D, PharmD, BCPS Clinical Pharmacist 01/29/2017 1:26 PM

## 2017-01-29 NOTE — Progress Notes (Signed)
Patient was sleeping of time I cane by. No family members were present. I did speak with the daughter yesterday and explained the poor prognosis for any type of salvage surgery on the foot and felt like Below-knee amputation was the appropriate way to go. It was also spoken with a family member. She is scheduled for possible angioplasty tomorrow. I will see what vascular has to say about her condition but even with some improved per her circulation the extensive damage to the skin soft tissue and bone to the left heel with precipitated a very very long healing event if healing is possible at all. I think since she is a non-weightbearing tori individual 99% of the time she would be better served with bloody amputation which would have a chance to heal in a month or so as compared to a chronic long-term wound on the back of her left heel. Subsequently podiatry will sign off right now unless the family chooses to try and preserve whatever the left foot. The best case please reconsult and we'll reevaluate.

## 2017-01-29 NOTE — Progress Notes (Signed)
Pt complaining that she cannot void. I performed bladder scanner and pt has 607ml in bladder. Dr. Sherryll BurgerShah notified. Per his verbal order will perform in and out catheterization once.

## 2017-01-29 NOTE — Progress Notes (Addendum)
Bladder scanned pt again as she still feels that she cannot void and has a sensation of pressure in her lower abdomen. Bladder scanner showed a volume of 550ml of urine. Dr. Sherryll BurgerShah paged, verbal order to perform in and out catheterization and to perform Q 6 hour bladder scans on pt received.   In addition, pt's GI panel came back negative. Dr. Sherryll BurgerShah updated, per his order will D/C enteric precautions.

## 2017-01-30 ENCOUNTER — Encounter
Admission: EM | Disposition: A | Discharge status: Skilled Nursing Facility | Payer: Self-pay | Source: Home / Self Care | Attending: Internal Medicine

## 2017-01-30 DIAGNOSIS — I70248 Atherosclerosis of native arteries of left leg with ulceration of other part of lower left leg: Secondary | ICD-10-CM

## 2017-01-30 HISTORY — PX: LOWER EXTREMITY INTERVENTION: CATH118252

## 2017-01-30 HISTORY — PX: LOWER EXTREMITY ANGIOGRAPHY: CATH118251

## 2017-01-30 LAB — CULTURE, BLOOD (ROUTINE X 2)
CULTURE: NO GROWTH
CULTURE: NO GROWTH

## 2017-01-30 LAB — GLUCOSE, CAPILLARY
GLUCOSE-CAPILLARY: 89 mg/dL (ref 65–99)
GLUCOSE-CAPILLARY: 61 mg/dL — AB (ref 65–99)
GLUCOSE-CAPILLARY: 78 mg/dL (ref 65–99)
Glucose-Capillary: 115 mg/dL — ABNORMAL HIGH (ref 65–99)
Glucose-Capillary: 167 mg/dL — ABNORMAL HIGH (ref 65–99)

## 2017-01-30 SURGERY — LOWER EXTREMITY ANGIOGRAPHY
Anesthesia: Moderate Sedation | Laterality: Left

## 2017-01-30 MED ORDER — FENTANYL CITRATE (PF) 100 MCG/2ML IJ SOLN
INTRAMUSCULAR | Status: DC | PRN
  Administered 2017-01-30: 25 ug via INTRAVENOUS

## 2017-01-30 MED ORDER — CEFAZOLIN IN D5W 1 GM/50ML IV SOLN
1.0000 g | Freq: Once | INTRAVENOUS | Status: AC
  Administered 2017-01-30: 1 g via INTRAVENOUS

## 2017-01-30 MED ORDER — MIDAZOLAM HCL 2 MG/2ML IJ SOLN
INTRAMUSCULAR | Status: DC | PRN
  Administered 2017-01-30: 1 mg via INTRAVENOUS

## 2017-01-30 MED ORDER — HEPARIN (PORCINE) IN NACL 2-0.9 UNIT/ML-% IJ SOLN
INTRAMUSCULAR | Status: AC
  Filled 2017-01-30: qty 1000

## 2017-01-30 MED ORDER — HEPARIN SODIUM (PORCINE) 1000 UNIT/ML IJ SOLN
INTRAMUSCULAR | Status: DC | PRN
  Administered 2017-01-30: 4000 [IU] via INTRAVENOUS

## 2017-01-30 MED ORDER — FENTANYL CITRATE (PF) 100 MCG/2ML IJ SOLN
INTRAMUSCULAR | Status: AC
  Filled 2017-01-30: qty 2

## 2017-01-30 MED ORDER — MIDAZOLAM HCL 5 MG/5ML IJ SOLN
INTRAMUSCULAR | Status: AC
  Filled 2017-01-30: qty 5

## 2017-01-30 MED ORDER — IOPAMIDOL (ISOVUE-300) INJECTION 61%
INTRAVENOUS | Status: DC | PRN
  Administered 2017-01-30: 45 mL via INTRA_ARTERIAL

## 2017-01-30 MED ORDER — LIDOCAINE-EPINEPHRINE (PF) 2 %-1:200000 IJ SOLN
INTRAMUSCULAR | Status: AC
  Filled 2017-01-30: qty 20

## 2017-01-30 MED ORDER — HEPARIN SODIUM (PORCINE) 1000 UNIT/ML IJ SOLN
INTRAMUSCULAR | Status: AC
  Filled 2017-01-30: qty 1

## 2017-01-30 MED ORDER — CEFAZOLIN IN D5W 1 GM/50ML IV SOLN
INTRAVENOUS | Status: AC
  Filled 2017-01-30: qty 50

## 2017-01-30 SURGICAL SUPPLY — 13 items
BALLN LUTONIX DCB 4X100X130 (BALLOONS) ×4
BALLOON LUTONIX DCB 4X100X130 (BALLOONS) IMPLANT
CATH PIG 70CM (CATHETERS) ×4 IMPLANT
CATH VERT 100CM (CATHETERS) ×2 IMPLANT
DEVICE STARCLOSE SE CLOSURE (Vascular Products) ×2 IMPLANT
GLIDEWIRE ADV .035X260CM (WIRE) ×2 IMPLANT
PACK ANGIOGRAPHY (CUSTOM PROCEDURE TRAY) ×4 IMPLANT
SHEATH ANL2 6FRX45 HC (SHEATH) ×2 IMPLANT
SHEATH BRITE TIP 5FRX11 (SHEATH) ×4 IMPLANT
SYR MEDRAD MARK V 150ML (SYRINGE) ×4 IMPLANT
TUBING CONTRAST HIGH PRESS 72 (TUBING) ×4 IMPLANT
WIRE J 3MM .035X145CM (WIRE) ×4 IMPLANT
WIRE MAGIC TORQUE 260C (WIRE) ×2 IMPLANT

## 2017-01-30 NOTE — Consult Note (Signed)
   [facility]   01/30/2017  Sonnie Colla Sep 25, 1934 [id number]   [facility]. Ms. Rebecca EatonMcTillman is already active with [facility]. She is [facility] RNCM, [facility] LCSW, and [facility] Pharmac[facility]. Please see chart review then notes for extensive patient outreach details from [facility] team.   Telephone call made to inpatient RNCM to make aware that Ms. Goldammer is [facility].   Raiford Nobletika Hall, MSN-Ed, RN,BSN [facility] Care [facility] Hospital Liaison [phone]

## 2017-01-30 NOTE — Progress Notes (Signed)
[facility] INFECTIOUS DISEASE PROGRESS NOTE Date of Admission:  01/25/2017     ID: [name] [name] is a 83 y.o. female with heel ulcer and osteomyelitis Active Problems:   Acute osteomyelitis of left foot (HCC)   Pressure ulcer of left heel, stage 4 (HCC)   Subjective: No fevers, for angiogram   ROS  Eleven systems are reviewed and negative except per hpi  Medications:  Antibiotics Given (last 72 hours)    Date/Time Action Medication Dose Rate   01/27/17 1547 Given   piperacillin-tazobactam (ZOSYN) IVPB 3.375 g 3.375 g 12.5 mL/hr   01/27/17 2133 Given   piperacillin-tazobactam (ZOSYN) IVPB 3.375 g 3.375 g 12.5 mL/hr   01/28/17 0616 Given   piperacillin-tazobactam (ZOSYN) IVPB 3.375 g 3.375 g 12.5 mL/hr   01/28/17 1311 Given   piperacillin-tazobactam (ZOSYN) IVPB 3.375 g 3.375 g 12.5 mL/hr   01/28/17 1654 Given  [move 1 hour for zosyn administration]   vancomycin (VANCOCIN) IVPB 750 mg/150 ml premix 750 mg 150 mL/hr   01/28/17 2039 Given   piperacillin-tazobactam (ZOSYN) IVPB 3.375 g 3.375 g 12.5 mL/hr   01/29/17 0514 Given   piperacillin-tazobactam (ZOSYN) IVPB 3.375 g 3.375 g 12.5 mL/hr   01/29/17 1500 Given   piperacillin-tazobactam (ZOSYN) IVPB 3.375 g 3.375 g 12.5 mL/hr   01/29/17 1644 Given   vancomycin (VANCOCIN) IVPB 750 mg/150 ml premix 750 mg 150 mL/hr   01/29/17 2049 Given   piperacillin-tazobactam (ZOSYN) IVPB 3.375 g 3.375 g 12.5 mL/hr   01/30/17 0506 Given   piperacillin-tazobactam (ZOSYN) IVPB 3.375 g 3.375 g 12.5 mL/hr   01/30/17 1339 Given   ceFAZolin (ANCEF) IVPB 1 g/50 mL premix 1 g 100 mL/hr     . aspirin EC  81 mg Oral Daily  . atorvastatin  10 mg Oral QHS  . clopidogrel  75 mg Oral Daily  . enoxaparin (LOVENOX) injection  30 mg Subcutaneous Q24H  . furosemide  20 mg Oral BID  . gabapentin  200 mg Oral BID  . insulin aspart  0-5 Units Subcutaneous QHS  . insulin aspart  0-9 Units Subcutaneous TID WC  . insulin aspart  5 Units Subcutaneous  TID WC  . insulin glargine  18 Units Subcutaneous Daily  . loratadine  10 mg Oral Daily  . mometasone-formoterol  2 puff Inhalation BID  . piperacillin-tazobactam (ZOSYN)  IV  3.375 g Intravenous Q8H  . Vancomycin  750 mg Intravenous Q24H  . Vitamin D (Ergocalciferol)  50,000 Units Oral Weekly    Objective: Vital signs in last 24 hours: Temp:  [98.1 F (36.7 C)-98.9 F (37.2 C)] 98.3 F (36.8 C) (02/12 1235) Pulse Rate:  [70-76] 71 (02/12 1515) Resp:  [10-18] 14 (02/12 1500) BP: (114-129)/(37-63) 129/54 (02/12 1515) SpO2:  [92 %-99 %] 97 % (02/12 1500) Constitutional:  oriented to person, place, and time. appears well-developed and well-nourished. No distress.  HENT: [place]/AT, PERRLA, no scleral icterus Mouth/Throat: Oropharynx is clear and moist. No oropharyngeal exudate.  Cardiovascular: Normal rate, regular rhythm and normal heart sounds. Pulmonary/Chest: Effort normal and breath sounds normal. No respiratory distress.  has no wheezes.  Neck = supple, no nuchal rigidity Abdominal: Soft. Bowel sounds are normal.  exhibits no distension. There is no tenderness.  Lymphadenopathy: no cervical adenopathy. No axillary adenopathy Neurological: alert and oriented to person, place, and time.  Skin: L heel with extensive necrotic black eschar, foul odor,  Ext no edmea Vascular diminished DP, TP Psychiatric: a normal mood and affect.  behavior is normal.  Lab Results  Recent Labs  01/28/17 0317 01/29/17 0328  WBC  --  5.6  HGB  --  8.7*  HCT  --  26.7*  NA 136 138  K 4.2 4.2  CL 101 104  CO2 28 29  BUN 51* 49*  CREATININE 1.67* 1.52*    Microbiology: Results for orders placed or performed during the hospital encounter of 01/25/17  Culture, blood (routine x 2)     Status: None   Collection Time: 01/25/17 12:26 PM  Result Value Ref Range Status   Specimen Description BLOOD RIGHT FA  Final   Special Requests BOTTLES DRAWN AEROBIC AND ANAEROBIC BCHV  Final   Culture NO  GROWTH 5 DAYS  Final   Report Status 01/30/2017 FINAL  Final  Culture, blood (routine x 2)     Status: None   Collection Time: 01/25/17  1:20 PM  Result Value Ref Range Status   Specimen Description BLOOD R FA  Final   Special Requests BOTTLES DRAWN AEROBIC AND ANAEROBIC BCAV  Final   Culture NO GROWTH 5 DAYS  Final   Report Status 01/30/2017 FINAL  Final  Aerobic/Anaerobic Culture (surgical/deep wound)     Status: None (Preliminary result)   Collection Time: 01/25/17  7:00 PM  Result Value Ref Range Status   Specimen Description FOOT  Final   Special Requests NONE  Final   Gram Stain NO WBC SEEN RARE GRAM VARIABLE ROD   Final   Culture   Final    RARE NORMAL SKIN FLORA HOLDING FOR POSSIBLE ANAEROBE Performed at [facility], [address], [place], [place] [address]    Report Status PENDING  Incomplete  Gastrointestinal Panel by PCR , Stool     Status: None   Collection Time: 01/29/17  9:48 AM  Result Value Ref Range Status   Campylobacter species NOT DETECTED NOT DETECTED Final   Plesimonas shigelloides NOT DETECTED NOT DETECTED Final   Salmonella species NOT DETECTED NOT DETECTED Final   Yersinia enterocolitica NOT DETECTED NOT DETECTED Final   Vibrio species NOT DETECTED NOT DETECTED Final   Vibrio cholerae NOT DETECTED NOT DETECTED Final   Enteroaggregative E coli (EAEC) NOT DETECTED NOT DETECTED Final   Enteropathogenic E coli (EPEC) NOT DETECTED NOT DETECTED Final   Enterotoxigenic E coli (ETEC) NOT DETECTED NOT DETECTED Final   Shiga like toxin producing E coli (STEC) NOT DETECTED NOT DETECTED Final   Shigella/Enteroinvasive E coli (EIEC) NOT DETECTED NOT DETECTED Final   Cryptosporidium NOT DETECTED NOT DETECTED Final   Cyclospora cayetanensis NOT DETECTED NOT DETECTED Final   Entamoeba histolytica NOT DETECTED NOT DETECTED Final   Giardia lamblia NOT DETECTED NOT DETECTED Final   Adenovirus F40/41 NOT DETECTED NOT DETECTED Final   Astrovirus NOT DETECTED NOT  DETECTED Final   Norovirus GI/GII NOT DETECTED NOT DETECTED Final   Rotavirus A NOT DETECTED NOT DETECTED Final   Sapovirus (I, II, IV, and V) NOT DETECTED NOT DETECTED Final    Studies/Results: No results found.  Assessment/Plan: [name] [name] is a 83 y.o. female With diabetes and chronic kidney disease admitted with chronic ulceration of the heel with MRI evidence of osteomyelitis as well as myositis.  On admission white count was 7.1 patient has been afebrile.  She has been on Zosyn.  Blood cultures and wound cultures are pending.  She has been seen by podiatry and vascular.  BKA has been recommended.   I discussed with her and her brother the rec  for a BKA and that I agree with this. She is at risk of sepsis from this infection and if does not have a BKA will need PICC and IV abx. They are considering it and seem to be amendable to it.  She is getting angiogram today   Recommendations At this point I would continue Zosyn.  If she ends up undergoing angiography and surgical intervention bone culture should be sent.  She would then likely need 6 weeks of IV antibiotics. Can dc vanco since no MRSA found However if she ends up having a BKA in the infection is removed she could be treated with a short course of postoperative antibiotics. Thank you very much for the consult. Will follow with you.  [name] [name]   01/30/2017, 3:40 PM

## 2017-01-30 NOTE — Op Note (Addendum)
[facility] Percutaneous Study/Intervention Procedural Note   Date of Surgery: 01/30/2017  Surgeon(s):Jaidon Ellery   Assistants:none  Pre-operative Diagnosis: PAD with ulceration and osteomyelitis of the left heel  Post-operative diagnosis: Same  Procedure(s) Performed: 1. Ultrasound guidance for vascular access right femoral artery 2. Catheter placement into left peroneal artery from right femoral approach 3. Aortogram and selective left lower extremity angiogram 4. Percutaneous transluminal angioplasty of tibioperoneal trunk and distal popliteal artery with 4 mm diameter by 10 cm length Lutonix drug-coated angioplasty balloon 5. StarClose closure device right femoral artery  EBL: Minimal  Contrast: 45 cc  Fluoro Time: 2.2 minutes  Moderate Conscious Sedation Time: approximately 25 minutes using 1 mg of Versed and 25 mcg of Fentanyl  Indications: [name] a 83 y.o.female with severe osteomyelitis of the left heel with a chronic nonhealing ulceration. She has previously undergone bilateral lower extremity revascularizations last year. The patient is brought in for angiography for further evaluation and potential treatment. Risks and benefits are discussed and informed consent is obtained  Procedure: The patient was identified and appropriate procedural time out was performed. The patient was then placed supine on the table and prepped and draped in the usual sterile fashion.Moderate conscious sedation was administered during a face to face encounter with the patient throughout the procedure with my supervision of the RN administering medicines and monitoring the patient's vital signs, pulse oximetry, telemetry and mental status throughout from the start of the procedure until the patient was taken to the recovery room. Ultrasound was used to evaluate the right common femoral  artery. It was patent . A digital ultrasound image was acquired. A Seldinger needle was used to access the right common femoral artery under direct ultrasound guidance and a permanent image was performed. A 0.035 J wire was advanced without resistance and a 5Fr sheath was placed. Pigtail catheter was placed into the aorta and an AP aortogram was performed. This demonstrated normal renal arteries and normal aorta and iliac segments without significant stenosis. I then crossed the aortic bifurcation and advanced to the left femoral head. Selective left lower extremity angiogram was then performed. This demonstrated a normal common femoral artery, profunda femoris artery, and superficial femoral artery that were all patent without any significant stenosis of more than 25%. The popliteal artery below the knee had about a 70% stenosis just above the takeoff of the anterior tibial artery which was chronically occluded. This continued down into the tibioperoneal trunk and remained in the 70-75% range until the peroneal artery where the vessel normalized and was the only runoff distally. The posterior tibial artery was chronically occluded as well.. The patient was systemically heparinized and a 6 [place] Ansell sheath was then placed over the [employer] wire. I then used a Kumpe catheter and the advantage wire to navigate down into the popliteal artery and across the popliteal artery and tibioperoneal trunk stenosis. The Kumpe catheter was used to confirm intraluminal flow in the proximal peroneal artery and the wire was then replaced. I treated this area with a single inflation of a 4 mm diameter by 10 cm length Lutonix drug-coated angioplasty balloon in the below-knee popliteal artery and tibioperoneal trunk. This was inflated to 10 atm for 1 minute. Completion angiogram showed an excellent result with less than 10% residual stenosis and brisk flow distally. I elected to terminate the procedure. The sheath was  removed and StarClose closure device was deployed in the right femoral artery with excellent hemostatic result. The patient  was taken to the recovery room in stable condition having tolerated the procedure well.  Findings:  Aortogram: Normal renal arteries, normal aorta and iliac segments without significant stenosis Left Lower Extremity: Common femoral artery, profunda femoris artery, and superficial femoral artery were all patent without any significant stenosis of more than 25%. The popliteal artery below the knee had about a 70% stenosis just above the takeoff of the anterior tibial artery which was chronically occluded. This continued down into the tibioperoneal trunk and remained in the 70-75% range until the peroneal artery where the vessel normalized and was the only runoff distally. The posterior tibial artery was chronically occluded as well.   Disposition: Patient was taken to the recovery room in stable condition having tolerated the procedure well.  Complications: None  Leotis Pain 01/30/2017 2:30 PM   This note was created with Dragon Medical transcription system. Any errors in dictation are purely unintentional.

## 2017-01-30 NOTE — Progress Notes (Signed)
[facility]   PATIENT NAME: [name] [name]    MR#:  [id number]  DATE OF BIRTH:  [birth year]  SUBJECTIVE:  CHIEF COMPLAINT:   Chief Complaint  Patient presents with  . Fever  . Wound Check  feels better, waiting for angiogram today, wants to get her BKA done REVIEW OF SYSTEMS:  Review of Systems  Constitutional: Negative for chills, fever and weight loss.  HENT: Negative for nosebleeds and sore throat.   Eyes: Negative for blurred vision.  Respiratory: Negative for cough, shortness of breath and wheezing.   Cardiovascular: Negative for chest pain, orthopnea, leg swelling and PND.  Gastrointestinal: Negative for constipation, heartburn, nausea and vomiting.  Genitourinary: Negative for dysuria and urgency.  Musculoskeletal: Positive for joint pain (left foot). Negative for back pain.  Skin: Negative for rash.  Neurological: Negative for dizziness, speech change, focal weakness and headaches.  Endo/Heme/Allergies: Does not bruise/bleed easily.  Psychiatric/Behavioral: Negative for depression.   DRUG ALLERGIES:  No Known Allergies VITALS:  Blood pressure 114/63, pulse 71, temperature 98.3 F (36.8 C), resp. rate 18, height 5\' 3"  (1.6 m), weight 72.8 kg (160 lb 9.6 oz), SpO2 96 %. PHYSICAL EXAMINATION:  Physical Exam  Constitutional: She is oriented to person, place, and time and well-developed, well-nourished, and in no distress.  HENT:  Head: Normocephalic and atraumatic.  Eyes: Conjunctivae and EOM are normal. Pupils are equal, round, and reactive to light.  Neck: Normal range of motion. Neck supple. No tracheal deviation present. No thyromegaly present.  Cardiovascular: Normal rate, regular rhythm and normal heart sounds.   Pulmonary/Chest: Effort normal and breath sounds normal. No respiratory distress. She has no wheezes. She exhibits no tenderness.  Abdominal: Soft. Bowel sounds are normal. She exhibits no distension. There is  no tenderness.  Musculoskeletal: Normal range of motion.       Left foot: There is tenderness and bony tenderness.  Left heel necrotic ulcer with foul smelling drainage  Neurological: She is alert and oriented to person, place, and time. No cranial nerve deficit.  Skin: Skin is warm and dry. Lesion and rash noted.  Left heel necrotic ulcer with foul smelling drainage  Psychiatric: Mood and affect normal.   LABORATORY PANEL:   CBC  Recent Labs Lab 01/29/17 0328  WBC 5.6  HGB 8.7*  HCT 26.7*  PLT 351   ------------------------------------------------------------------------------------------------------------------ Chemistries   Recent Labs Lab 01/25/17 1227  01/29/17 0328  NA 140  < > 138  K 4.6  < > 4.2  CL 106  < > 104  CO2 28  < > 29  GLUCOSE 259*  < > 281*  BUN 44*  < > 49*  CREATININE 1.61*  < > 1.52*  CALCIUM 8.4*  < > 8.4*  AST 14*  --   --   ALT 9*  --   --   ALKPHOS 78  --   --   BILITOT 0.5  --   --   < > = values in this interval not displayed. RADIOLOGY:  No results found. ASSESSMENT AND PLAN:  83 year old female with past medical history of CHF chronic diastolic, hypertension, diabetes, seizure previous CVA who presents to the hospital due to her subjective fevers and left foot ulcer.  * Left Foot diabetic ulcer/osteomyelitis- Confirmed on MRI appreciate podiatry input.   - Continued intravenous Zosyn. vanco  - Appreciate infectious disease & Vascular surgery input.  She will likely require below-knee amputation, but waiting for family  decision - for Angiogram today - Await wound culture sensitivity  * Diarrhea - resolved, this was likely due to laxatives which has been stopped - stool GI panel neg  * Hypotension - stop coreg and lisinopril, continue lasix  2. Diabetes type 2 without complication-continue Lantus (increase to 15 units), Aspart increase to 5 units TID. sliding scale insulin. - Diabetic nurse coordinator c/s  3. CHF-chronic  diastolic CHF. -Clinically patient is not in congestive heart failure. Continue Lasix, carvedilol, lisinopril.  4. Hyperlipidemia-continue atorvastatin.  5. COPD-no acute exacerbation-continue Dulera, albuterol inhaler as needed.  6. Diabetic neuropathy-continue gabapentin.   All the records are reviewed and case discussed with Care Management/Social Worker. Management plans discussed with the patient, RN, DR DEW and they are in agreement.  CODE STATUS: Full code  TOTAL TIME TAKING CARE OF THIS PATIENT: 35 minutes.   More than 50% of the time was spent in counseling/coordination of care: YES  POSSIBLE D/C IN 2-3 DAYS, DEPENDING ON CLINICAL CONDITION.  And podiatry, vascular surgery and infectious disease workup   Delfino LovettVipul Marjean Imperato M.D on 01/30/2017 at 12:59 PM  Between 7am to 6pm - Pager - [phone]  After 6pm go to [email or web address] - Social research officer, governmentpassword [email or web address] [email or web address]  [facility]  Office  [phone]  CC: Primary care physician; Barbette ReichmannHANDE,VISHWANATH, MD  Note: This dictation was prepared with Dragon dictation along with smaller phrase technology. Any transcriptional errors that result from this process are unintentional.

## 2017-01-30 NOTE — Progress Notes (Signed)
PT Cancellation Note  Patient Details Name: [name] [name] MRN: [id number] DOB: 1934/01/02   Cancelled Treatment:    Reason Eval/Treat Not Completed: Patient at procedure or test/unavailable. Treatment attempted; pt unavailable. Re attempt at a later time/date.    Scot DockHeidi E Barnes, PTA 01/30/2017, 2:33 PM

## 2017-01-30 NOTE — H&P (Signed)
[facility] History & Physical Update  The patient was interviewed and re-examined.  The patient's previous History and Physical has been reviewed and is unchanged.  The family is considering BKA and leaning in that direction as the Podiatry service does not feel the foot is likely to be salvaged.  Assessment of perfusion still of benefit and angiogram will provide that information as well as allow treatment of disease to optimize perfusion. We plan to proceed with the scheduled procedure of left leg angiogram with possible revascularization.  Festus BarrenJason Verda Mehta, MD  01/30/2017, 1:14 PM

## 2017-01-30 NOTE — Progress Notes (Signed)
Discussed with pt the plan of angiogram LLE today with Dr. Wyn Quakerew. She verbalized to me that she was in agreement with plan and understood R/B/A. She signed the consent (not legible due to "I cant really see the line without my glasses", this RN was a witness) form but asked if I would call her daughter just to let her know. I attempted X2 to get in touch with daughter with no response, VM left with instructions to call back when message received. Consent was placed on patient chart.

## 2017-01-30 NOTE — Progress Notes (Signed)
Pt post angiogram with vitals stable, no bleeding nor hematoma at right groin site, report called to USAAaron RN on ortho. Plan reviewed. sr per monitor, denies complaints.

## 2017-01-31 ENCOUNTER — Encounter: Payer: Self-pay | Admitting: Vascular Surgery

## 2017-01-31 LAB — GLUCOSE, CAPILLARY
Glucose-Capillary: 116 mg/dL — ABNORMAL HIGH (ref 65–99)
Glucose-Capillary: 175 mg/dL — ABNORMAL HIGH (ref 65–99)
Glucose-Capillary: 151 mg/dL — ABNORMAL HIGH (ref 65–99)
Glucose-Capillary: 113 mg/dL — ABNORMAL HIGH (ref 65–99)

## 2017-01-31 LAB — AEROBIC/ANAEROBIC CULTURE W GRAM STAIN (SURGICAL/DEEP WOUND)
Culture: NORMAL
Gram Stain: NONE SEEN

## 2017-01-31 MED ORDER — SODIUM CHLORIDE 0.9 % IV SOLN
INTRAVENOUS | Status: DC
  Administered 2017-01-31 – 2017-02-01 (×3): via INTRAVENOUS

## 2017-01-31 MED ORDER — CHLORHEXIDINE GLUCONATE CLOTH 2 % EX PADS
6.0000 | MEDICATED_PAD | Freq: Once | CUTANEOUS | Status: AC
  Administered 2017-02-01: 6 via TOPICAL

## 2017-01-31 NOTE — Progress Notes (Signed)
[facility]  Daily Progress Note   Subjective  - 1 Day Post-Op  Had angiogram yesterday without event.  One area of stenosis treated with angioplasty.   Family has agreed to left BKA as Podiatry does not feel there is salvageable foot at this point secondary to extensive infection on a non-ambulatory patient.   Objective Vitals:   01/30/17 1540 01/30/17 1949 01/31/17 0417 01/31/17 0742  BP: (!) 137/59 (!) 150/62 (!) 100/59 (!) 119/45  Pulse: 70 80 77 77  Resp: 16 18 18    Temp: 97.7 F (36.5 C) 97.9 F (36.6 C) 98.7 F (37.1 C) 98.4 F (36.9 C)  TempSrc: Oral Oral Oral Oral  SpO2: 100% 98% 99% 96%  Weight:      Height:        Intake/Output Summary (Last 24 hours) at 01/31/17 [id number] Last data filed at 01/30/17 1800  Gross per 24 hour  Intake              240 ml  Output                0 ml  Net              240 ml    PULM  CTAB CV  RRR VASC  Access site C/D/I. Left foot dressed  Laboratory CBC    Component Value Date/Time   WBC 5.6 01/29/2017 0328   HGB 8.7 (L) 01/29/2017 0328   HGB 11.5 (L) 02/04/2014 1522   HCT 26.7 (L) 01/29/2017 0328   HCT 35.8 02/04/2014 1522   PLT 351 01/29/2017 0328   PLT 174 02/04/2014 1522    BMET    Component Value Date/Time   NA 138 01/29/2017 0328   NA 141 02/04/2014 1636   K 4.2 01/29/2017 0328   K 4.3 02/04/2014 1636   CL 104 01/29/2017 0328   CL 108 (H) 02/04/2014 1636   CO2 29 01/29/2017 0328   CO2 29 02/04/2014 1636   GLUCOSE 281 (H) 01/29/2017 0328   GLUCOSE 225 (H) 02/04/2014 1636   BUN 49 (H) 01/29/2017 0328   BUN 12 02/04/2014 1636   CREATININE 1.52 (H) 01/29/2017 0328   CREATININE 1.06 02/04/2014 1636   CALCIUM 8.4 (L) 01/29/2017 0328   CALCIUM 9.3 02/04/2014 1636   GFRNONAA 31 (L) 01/29/2017 0328   GFRNONAA 51 (L) 02/04/2014 1636   GFRAA 36 (L) 01/29/2017 0328   GFRAA 59 (L) 02/04/2014 1636    Assessment/Planning: POD #1 s/p LLE angiogram with revascularization   Family has  decided to proceed with left BKA.    Will schedule for tomorrow or Thursday as the schedule allows    [name] [name]  01/31/2017, 8:33 AM

## 2017-01-31 NOTE — Progress Notes (Signed)
Physical Therapy Treatment Patient Details Name: Nancee Brownrigg MRN: [id number] DOB: 1934/06/28 Today's Date: 01/31/2017    History of Present Illness 83 year old female with past medical history of CHF chronic diastolic, hypertension, diabetes, seizure previous CVA who presents to the hospital due to her subjective fevers and left foot ulcer. L foot MRI showed chronic osteomyelitis posteriorly and inferiorly in the calcaneus. Unclear at this time if pt will require surgical management. Pt will be maintained NWB on LLE during PT evaluation. Pt is AOx1 at time of evaluation so unclear accuracy of history    PT Comments    Pt agreeable to PT; denies pain. Pt wishes up in the chair. Participates in supine bed exercises. Mod A for bed mobility with heavy cueing. Max A x 2 for lateral scoot bed to chair. Pt comfortable up in chair. Continue PT to progress strength and endurance to increased functional mobility.   Follow Up Recommendations  SNF     Equipment Recommendations  None recommended by PT    Recommendations for Other Services       Precautions / Restrictions Precautions Precautions: Fall Restrictions Weight Bearing Restrictions: Yes LLE Weight Bearing: Weight bearing as tolerated    Mobility  Bed Mobility Overal bed mobility: Needs Assistance Bed Mobility: Supine to Sit     Supine to sit: Mod assist     General bed mobility comments: Pt mldly anxious and grabs at therapist versus following instructions for hand placement/assist  Transfers Overall transfer level: Needs assistance Equipment used: None Transfers: Lateral/Scoot Transfers          Lateral/Scoot Transfers: +2 physical assistance;Max assist General transfer comment: Lateral scoot bed to chair. Heavy cueing for hand placement/assist  Ambulation/Gait                 Stairs            Wheelchair Mobility    Modified Rankin (Stroke Patients Only)       Balance Overall balance  assessment: Needs assistance Sitting-balance support: Bilateral upper extremity supported Sitting balance-Leahy Scale: Fair Sitting balance - Comments: Difficulty following instructions to keep RLE on the ground                            Cognition Arousal/Alertness: Awake/alert Behavior During Therapy: [facility] for tasks assessed/performed;Anxious Overall Cognitive Status: Within Functional Limits for tasks assessed                      Exercises General Exercises - Lower Extremity Ankle Circles/Pumps: AROM;Both;10 reps;Supine Quad Sets: Strengthening;Both;10 reps;Supine Heel Slides: AROM;Both;10 reps;Supine Hip ABduction/ADduction: AAROM;Both;10 reps;Supine Straight Leg Raises: AAROM;Both;10 reps;Supine    General Comments        Pertinent Vitals/Pain Pain Assessment: No/denies pain    Home Living                      Prior Function            PT Goals (current goals can now be found in the care plan section) Progress towards PT goals: Progressing toward goals    Frequency    Min 2X/week      PT Plan Current plan remains appropriate    Co-evaluation             End of Session   Activity Tolerance: Patient tolerated treatment well (weakness) Patient left: in chair;with call bell/phone within reach;with chair alarm set;with family/visitor present  Time: [id number] PT Time Calculation (min) (ACUTE ONLY): 24 min  Charges:  $Therapeutic Exercise: 8-22 mins $Therapeutic Activity: 8-22 mins                    G Codes:      Scot DockHeidi E Mica Ramdass, PTA 01/31/2017, 1:51 PM

## 2017-01-31 NOTE — Progress Notes (Signed)
Discussed again (discussed with Dr. Wyn Quakerew this AM) with pt about plan for amputation tomorrow, sister was at bedside as well. I answered anymore questions she had about procedure, the patient stated she wanted to proceed with plan. The sister called and discussed with other family members who were all in support of decision as well. Consent was signed, witnessed by myself (and sister in room) and placed on chart.

## 2017-01-31 NOTE — Progress Notes (Signed)
[facility] - [place] at [facility]   PATIENT NAME: [name] [name]    MR#:  [id number]  DATE OF BIRTH:  [birth year]  SUBJECTIVE:  CHIEF COMPLAINT:   Chief Complaint  Patient presents with  . Fever  . Wound Check  feels ok, no new issues. BKA planned for tomorrow/thursday REVIEW OF SYSTEMS:  Review of Systems  Constitutional: Negative for chills, fever and weight loss.  HENT: Negative for nosebleeds and sore throat.   Eyes: Negative for blurred vision.  Respiratory: Negative for cough, shortness of breath and wheezing.   Cardiovascular: Negative for chest pain, orthopnea, leg swelling and PND.  Gastrointestinal: Negative for constipation, heartburn, nausea and vomiting.  Genitourinary: Negative for dysuria and urgency.  Musculoskeletal: Positive for joint pain (left foot). Negative for back pain.  Skin: Negative for rash.  Neurological: Negative for dizziness, speech change, focal weakness and headaches.  Endo/Heme/Allergies: Does not bruise/bleed easily.  Psychiatric/Behavioral: Negative for depression.   DRUG ALLERGIES:  No Known Allergies VITALS:  Blood pressure (!) 119/45, pulse 77, temperature 98.4 F (36.9 C), temperature source Oral, resp. rate 18, height 5\' 3"  (1.6 m), weight 72.8 kg (160 lb 9.6 oz), SpO2 96 %. PHYSICAL EXAMINATION:  Physical Exam  Constitutional: She is oriented to person, place, and time and well-developed, well-nourished, and in no distress.  HENT:  Head: Normocephalic and atraumatic.  Eyes: Conjunctivae and EOM are normal. Pupils are equal, round, and reactive to light.  Neck: Normal range of motion. Neck supple. No tracheal deviation present. No thyromegaly present.  Cardiovascular: Normal rate, regular rhythm and normal heart sounds.   Pulmonary/Chest: Effort normal and breath sounds normal. No respiratory distress. She has no wheezes. She exhibits no tenderness.  Abdominal: Soft. Bowel sounds are normal. She exhibits no  distension. There is no tenderness.  Musculoskeletal: Normal range of motion.       Left foot: There is tenderness and bony tenderness.  Left heel necrotic ulcer with foul smelling drainage  Neurological: She is alert and oriented to person, place, and time. No cranial nerve deficit.  Skin: Skin is warm and dry. Lesion and rash noted.  Left heel necrotic ulcer with foul smelling drainage  Psychiatric: Mood and affect normal.   LABORATORY PANEL:   CBC  Recent Labs Lab 01/29/17 0328  WBC 5.6  HGB 8.7*  HCT 26.7*  PLT 351   ------------------------------------------------------------------------------------------------------------------ Chemistries   Recent Labs Lab 01/25/17 1227  01/29/17 0328  NA 140  < > 138  K 4.6  < > 4.2  CL 106  < > 104  CO2 28  < > 29  GLUCOSE 259*  < > 281*  BUN 44*  < > 49*  CREATININE 1.61*  < > 1.52*  CALCIUM 8.4*  < > 8.4*  AST 14*  --   --   ALT 9*  --   --   ALKPHOS 78  --   --   BILITOT 0.5  --   --   < > = values in this interval not displayed. RADIOLOGY:  No results found. ASSESSMENT AND PLAN:  83 year old female with past medical history of CHF chronic diastolic, hypertension, diabetes, seizure previous CVA who presents to the hospital due to her subjective fevers and left foot ulcer.  * Left Foot diabetic ulcer/osteomyelitis- Confirmed on MRI appreciate podiatry input.   - Continued intravenous Zosyn. Stopped vanco  - Appreciate infectious disease & Vascular surgery input.  She will likely require below-knee amputation planned for  tomorrow/thursday  * Diarrhea - resolved, this was likely due to laxatives which has been stopped - stool GI panel neg  * Hypotension - stop coreg and lisinopril, continue lasix  * Diabetes type 2 without complication-continue Lantus (increase to 18 units), Aspart increase to 5 units TID. sliding scale insulin. - Diabetic nurse coordinator c/s  * CHF-chronic diastolic CHF. -Clinically patient  is not in congestive heart failure. Continue Lasix, carvedilol, lisinopril.  * Hyperlipidemia-continue atorvastatin.  * COPD-no acute exacerbation-continue Dulera, albuterol inhaler as needed.  * Diabetic neuropathy-continue gabapentin.   All the records are reviewed and case discussed with Care Management/Social Worker. Management plans discussed with the patient, RN, DR DEW and they are in agreement.  CODE STATUS: Full code  TOTAL TIME TAKING CARE OF THIS PATIENT: 35 minutes.   More than 50% of the time was spent in counseling/coordination of care: YES  POSSIBLE D/C IN 2-3 DAYS, DEPENDING ON CLINICAL CONDITION.  And podiatry, vascular surgery and infectious disease workup   [name] [name] M.D on 01/31/2017 at 9:26 AM  Between 7am to 6pm - Pager - [phone]  After 6pm go to [email or web address] - Social research officer, governmentpassword EPAS [email or web address]  [facility]  Hospitalists  Office  [phone]  CC: Primary care physician; Barbette ReichmannHANDE,VISHWANATH, MD  Note: This dictation was prepared with Dragon dictation along with smaller phrase technology. Any transcriptional errors that result from this process are unintentional.

## 2017-02-01 ENCOUNTER — Inpatient Hospital Stay: Payer: Medicare HMO | Admitting: Anesthesiology

## 2017-02-01 ENCOUNTER — Encounter
Admission: EM | Disposition: A | Discharge status: Skilled Nursing Facility | Payer: Self-pay | Source: Home / Self Care | Attending: Internal Medicine

## 2017-02-01 DIAGNOSIS — I70262 Atherosclerosis of native arteries of extremities with gangrene, left leg: Secondary | ICD-10-CM

## 2017-02-01 HISTORY — PX: AMPUTATION: SHX166

## 2017-02-01 LAB — CREATININE, SERUM
CREATININE: 1.33 mg/dL — AB (ref 0.44–1.00)
GFR calc Af Amer: 43 mL/min — ABNORMAL LOW (ref >60)
GFR calc non Af Amer: 37 mL/min — ABNORMAL LOW (ref >60)

## 2017-02-01 LAB — GLUCOSE, CAPILLARY
GLUCOSE-CAPILLARY: 94 mg/dL (ref 65–99)
GLUCOSE-CAPILLARY: 83 mg/dL (ref 65–99)
GLUCOSE-CAPILLARY: 103 mg/dL — AB (ref 65–99)
Glucose-Capillary: 203 mg/dL — ABNORMAL HIGH (ref 65–99)

## 2017-02-01 SURGERY — AMPUTATION BELOW KNEE
Anesthesia: General | Site: Leg Upper | Laterality: Left | Wound class: Clean

## 2017-02-01 MED ORDER — EPHEDRINE SULFATE 50 MG/ML IJ SOLN
INTRAMUSCULAR | Status: AC
  Filled 2017-02-01: qty 1

## 2017-02-01 MED ORDER — ONDANSETRON HCL 4 MG/2ML IJ SOLN
INTRAMUSCULAR | Status: DC | PRN
Start: 2017-02-01 — End: 2017-02-01
  Administered 2017-02-01: 4 mg via INTRAVENOUS

## 2017-02-01 MED ORDER — FENTANYL CITRATE (PF) 100 MCG/2ML IJ SOLN
INTRAMUSCULAR | Status: DC | PRN
  Administered 2017-02-01 (×3): 25 ug via INTRAVENOUS

## 2017-02-01 MED ORDER — PROPOFOL 10 MG/ML IV BOLUS
INTRAVENOUS | Status: AC
  Filled 2017-02-01: qty 20

## 2017-02-01 MED ORDER — OXYCODONE-ACETAMINOPHEN 5-325 MG PO TABS
1.0000 | ORAL_TABLET | Freq: Four times a day (QID) | ORAL | Status: DC | PRN
  Administered 2017-02-01 – 2017-02-04 (×5): 1 via ORAL
  Filled 2017-02-01 (×6): qty 1

## 2017-02-01 MED ORDER — PIPERACILLIN-TAZOBACTAM 3.375 G IVPB
INTRAVENOUS | Status: AC
  Filled 2017-02-01: qty 50

## 2017-02-01 MED ORDER — ENOXAPARIN SODIUM 40 MG/0.4ML ~~LOC~~ SOLN
40.0000 mg | SUBCUTANEOUS | Status: DC
  Administered 2017-02-01 – 2017-02-03 (×3): 40 mg via SUBCUTANEOUS
  Filled 2017-02-01 (×3): qty 0.4

## 2017-02-01 MED ORDER — FENTANYL CITRATE (PF) 100 MCG/2ML IJ SOLN
INTRAMUSCULAR | Status: AC
  Filled 2017-02-01: qty 2

## 2017-02-01 MED ORDER — FENTANYL CITRATE (PF) 100 MCG/2ML IJ SOLN
INTRAMUSCULAR | Status: AC
Start: 2017-02-01 — End: 2017-02-02
  Filled 2017-02-01: qty 2

## 2017-02-01 MED ORDER — FENTANYL CITRATE (PF) 100 MCG/2ML IJ SOLN
25.0000 ug | INTRAMUSCULAR | Status: DC | PRN
  Administered 2017-02-01: 25 ug via INTRAVENOUS

## 2017-02-01 MED ORDER — ONDANSETRON HCL 4 MG/2ML IJ SOLN
INTRAMUSCULAR | Status: AC
  Filled 2017-02-01: qty 2

## 2017-02-01 MED ORDER — EPHEDRINE SULFATE 50 MG/ML IJ SOLN
INTRAMUSCULAR | Status: DC | PRN
  Administered 2017-02-01 (×3): 10 mg via INTRAVENOUS

## 2017-02-01 MED ORDER — LIDOCAINE HCL (PF) 2 % IJ SOLN
INTRAMUSCULAR | Status: AC
  Filled 2017-02-01: qty 2

## 2017-02-01 MED ORDER — LIDOCAINE HCL (CARDIAC) 20 MG/ML IV SOLN
INTRAVENOUS | Status: DC | PRN
  Administered 2017-02-01: 50 mg via INTRAVENOUS

## 2017-02-01 MED ORDER — PROPOFOL 10 MG/ML IV BOLUS
INTRAVENOUS | Status: DC | PRN
  Administered 2017-02-01: 100 mg via INTRAVENOUS

## 2017-02-01 MED ORDER — ONDANSETRON HCL 4 MG/2ML IJ SOLN: 4.0000 mg | Freq: Once | INTRAMUSCULAR | Status: DC | PRN

## 2017-02-01 SURGICAL SUPPLY — 37 items
BANDAGE ELASTIC 6 LF NS (GAUZE/BANDAGES/DRESSINGS) ×2 IMPLANT
BLADE SAGITTAL WIDE XTHICK NO (BLADE) ×2 IMPLANT
BNDG CMPR MED 5X6 ELC HKLP NS (GAUZE/BANDAGES/DRESSINGS) ×1
BNDG COHESIVE 4X5 TAN STRL (GAUZE/BANDAGES/DRESSINGS) ×2 IMPLANT
BNDG GAUZE 4.5X4.1 6PLY STRL (MISCELLANEOUS) ×4 IMPLANT
BRUSH SCRUB 4% CHG (MISCELLANEOUS) ×2 IMPLANT
CANISTER SUCT 1200ML W/VALVE (MISCELLANEOUS) ×2 IMPLANT
DRAIN PENROSE 1/4X12 LTX (DRAIN) ×1 IMPLANT
DRAPE INCISE IOBAN 66X60 STRL (DRAPES) ×1 IMPLANT
DURAPREP 26ML APPLICATOR (WOUND CARE) ×2 IMPLANT
ELECT CAUTERY BLADE 6.4 (BLADE) ×2 IMPLANT
ELECT REM PT RETURN 9FT ADLT (ELECTROSURGICAL) ×2
ELECTRODE REM PT RTRN 9FT ADLT (ELECTROSURGICAL) ×1 IMPLANT
GAUZE PETRO XEROFOAM 1X8 (MISCELLANEOUS) ×4 IMPLANT
GLOVE BIO SURGEON STRL SZ7 (GLOVE) ×4 IMPLANT
GLOVE INDICATOR 7.5 STRL GRN (GLOVE) ×2 IMPLANT
GOWN STRL REUS W/ TWL LRG LVL3 (GOWN DISPOSABLE) ×2 IMPLANT
GOWN STRL REUS W/ TWL XL LVL3 (GOWN DISPOSABLE) ×1 IMPLANT
GOWN STRL REUS W/TWL LRG LVL3 (GOWN DISPOSABLE) ×4
GOWN STRL REUS W/TWL XL LVL3 (GOWN DISPOSABLE) ×2
HANDLE YANKAUER SUCT BULB TIP (MISCELLANEOUS) ×3 IMPLANT
KIT RM TURNOVER STRD PROC AR (KITS) ×2 IMPLANT
LABEL OR SOLS (LABEL) ×2 IMPLANT
NS IRRIG 1000ML POUR BTL (IV SOLUTION) ×2 IMPLANT
PACK EXTREMITY ARMC (MISCELLANEOUS) ×2 IMPLANT
PAD ABD DERMACEA PRESS 5X9 (GAUZE/BANDAGES/DRESSINGS) ×4 IMPLANT
PAD PREP 24X41 OB/GYN DISP (PERSONAL CARE ITEMS) ×2 IMPLANT
SPONGE LAP 18X18 5 PK (GAUZE/BANDAGES/DRESSINGS) ×3 IMPLANT
STAPLER SKIN PROX 35W (STAPLE) ×2 IMPLANT
STOCKINETTE M/LG 89821 (MISCELLANEOUS) ×2 IMPLANT
SUT SILK 2 0 (SUTURE) ×2
SUT SILK 2 0 SH (SUTURE) ×4 IMPLANT
SUT SILK 2-0 18XBRD TIE 12 (SUTURE) ×1 IMPLANT
SUT SILK 3 0 (SUTURE) ×4
SUT SILK 3-0 18XBRD TIE 12 (SUTURE) ×1 IMPLANT
SUT VIC AB 0 CT1 36 (SUTURE) ×5 IMPLANT
SUT VIC AB 2-0 CT1 (SUTURE) ×4 IMPLANT

## 2017-02-01 NOTE — Transfer of Care (Signed)
Immediate Anesthesia Transfer of Care Note  Patient: Solymar Henes  Procedure(s) Performed: Procedure(s): AMPUTATION BELOW KNEE (Left)  Patient Location: PACU  Anesthesia Type:General  Level of Consciousness: sedated and responds to stimulation  Airway & Oxygen Therapy: Patient Spontanous Breathing and Patient connected to face mask oxygen  Post-op Assessment: Report given to RN and Post -op Vital signs reviewed and stable  Post vital signs: Reviewed and stable  Last Vitals:  Vitals:   02/01/17 1344 02/01/17 1656  BP: (!) 127/50 (!) 92/49  Pulse: 76 93  Resp: 16 18  Temp: (!) 35.9 C 36.4 C    Last Pain:  Vitals:   02/01/17 1344  TempSrc: Tympanic  PainSc:          Complications: No apparent anesthesia complications

## 2017-02-01 NOTE — Progress Notes (Signed)
Clinical Social Worker (CSW) contacted patient's niece [name] on yesterday 01/31/17 to discuss long term care plan. Per niece they tried to get patient on long term care medicaid while she was at [facility] for short term rehab. Per niece the barriers to getting patient on medicaid is that she owns 1 house and 1 car, has a CD in the bank that matured in October 2017, which was $3,000. Per niece they used the $3,000 to pay her equity loan on her home because her house is in jeopardy financially. Niece also reported that patient had some life insurance policies that they are getting rid of. Per niece patient's funeral is now paid for. Per niece they are trying to remove the barriers to long term care medicaid. Niece reported that they were also working on a hoyer lift in the home through [facility] and patient's primary care physician. CSW discussed case with Chief Executive OfficerCSW director. Patient will have a BKA and PT will evaluate. CSW will continue to follow and assist as needed.   Baker Hughes IncorporatedBailey Jatavian Calica, LCSW [phone]

## 2017-02-01 NOTE — Anesthesia Postprocedure Evaluation (Signed)
Anesthesia Post Note  Patient: [name] [name]  Procedure(s) Performed: Procedure(s) (LRB): AMPUTATION BELOW KNEE (Left)  Patient location during evaluation: PACU Anesthesia Type: [name] Level of consciousness: awake and alert and oriented Pain management: pain level controlled Vital Signs Assessment: post-procedure vital signs reviewed and stable Respiratory status: spontaneous breathing, nonlabored ventilation and respiratory function stable Cardiovascular status: blood pressure returned to baseline and stable Postop Assessment: no signs of nausea or vomiting Anesthetic complications: no     Last Vitals:  Vitals:   02/01/17 1810 02/01/17 1927  BP: (!) 118/49 129/64  Pulse: 86 86  Resp: 16 18  Temp: 36.6 C 37.1 C    Last Pain:  Vitals:   02/01/17 1810  TempSrc: Oral  PainSc:                  [name]

## 2017-02-01 NOTE — OR Nursing (Signed)
Dr Mordecai RasmussenVanstavern and Dr Wyn Quakerew aware patient had Plavix and it was stopped today.

## 2017-02-01 NOTE — Progress Notes (Signed)
Pharmacy Antibiotic Note  [name] [name] is a 83 y.o. female admitted on 01/25/2017 with Osteomyelitis.  Pharmacy has been consulted for Zosyn dosing.    Plan: Will continue patient on Zosyn 3.375 IV EI every 8 hours.      Height: 5\' 3"  (160 cm) Weight: 160 lb 9.6 oz (72.8 kg) IBW/kg (Calculated) : 52.4  Temp (24hrs), Avg:98.2 F (36.8 C), Min:97.8 F (36.6 C), Max:98.6 F (37 C)   Recent Labs Lab 01/25/17 1226  01/25/17 1227 01/25/17 1743 01/26/17 0444 01/27/17 0503 01/28/17 0317 01/28/17 1258 01/29/17 0328 01/29/17 1219 02/01/17 0353  WBC  --   --  7.1  --  4.8 9.5  --   --  5.6  --   --   CREATININE  --   < > 1.61*  --  1.48* 1.34* 1.67*  --  1.52*  --  1.33*  LATICACIDVEN 0.8  --   --  0.9  --   --   --   --   --   --   --   VANCOTROUGH  --   --   --   --   --   --   --   --   --  22*  --   VANCORANDOM  --   --   --   --   --   --   --  19  --   --   --   < > = values in this interval not displayed.  Estimated Creatinine Clearance: 32.3 mL/min (by C-G formula based on SCr of 1.33 mg/dL (H)).    No Known Allergies  Antimicrobials this admission: 2/7 Levofloxacin/cefepime x 1 dose 2/7 Vancomycin >>2/11 2/8 Zosyn  >>    Microbiology results: 2/7 BCx: NG Wound cx B frag  Thank you for allowing pharmacy to be a part of this patient's care.  Marty HeckWang, Hanae Waiters L, PharmD, BCPS Clinical Pharmacist 02/01/2017 12:00 PM

## 2017-02-01 NOTE — Anesthesia Procedure Notes (Signed)
Procedure Name: LMA Insertion Date/Time: 02/01/2017 3:29 PM Performed by: Omer JackWEATHERLY, Kala Gassmann Pre-anesthesia Checklist: Patient identified, Patient being monitored, Timeout performed, Emergency Drugs available and Suction available Patient Re-evaluated:Patient Re-evaluated prior to inductionOxygen Delivery Method: Circle system utilized Preoxygenation: Pre-oxygenation with 100% oxygen Intubation Type: IV induction Ventilation: Mask ventilation without difficulty LMA: LMA inserted LMA Size: 3.5 Tube type: Oral Number of attempts: 1 Placement Confirmation: positive ETCO2 and breath sounds checked- equal and bilateral Tube secured with: Tape Dental Injury: Teeth and Oropharynx as per pre-operative assessment

## 2017-02-01 NOTE — Progress Notes (Signed)
Anticoagulation monitoring(Lovenox):  83 yo  Female ordered Lovenox 30 mg Q24h  Filed Weights   01/25/17 1333 01/25/17 1730  Weight: 160 lb 9.6 oz (72.8 kg) 160 lb 9.6 oz (72.8 kg)   Body mass index is 28.45 kg/m.    Lab Results  Component Value Date   CREATININE 1.33 (H) 02/01/2017   CREATININE 1.52 (H) 01/29/2017   CREATININE 1.67 (H) 01/28/2017   Estimated Creatinine Clearance: 32.3 mL/min (by C-G formula based on SCr of 1.33 mg/dL (H)). Hemoglobin & Hematocrit     Component Value Date/Time   HGB 8.7 (L) 01/29/2017 0328   HGB 11.5 (L) 02/04/2014 1522   HCT 26.7 (L) 01/29/2017 0328   HCT 35.8 02/04/2014 1522     Per Protocol for Patient with estCrcl > 30 ml/min and BMI < 40, will transition to Lovenox 40 mg Q24h.

## 2017-02-01 NOTE — H&P (Signed)
[facility] History & Physical Update  The patient was interviewed and re-examined.  The patient's previous History and Physical has been reviewed and is unchanged.  There is no change in the plan of care. We plan to proceed with the scheduled procedure which is a left BKA.  [name], MD  02/01/2017, 2:18 PM

## 2017-02-01 NOTE — Progress Notes (Signed)
PT Cancellation Note  Patient Details Name: [name] [name] MRN: [id number] DOB: 1934-08-13   Cancelled Treatment:    Reason Eval/Treat Not Completed: Other (comment). Pt politely declines, as she is scheduled for surgery today. Pt to have left BKA and will need new PT orders post surgery.    Scot DockHeidi E Barnes, PTA 02/01/2017, 11:22 AM

## 2017-02-01 NOTE — Progress Notes (Signed)
[employer] [facility] - [place] at [facility]   PATIENT NAME: [name] [name]    MR#:  [id number]  DATE OF BIRTH:  February 25, 1934  SUBJECTIVE:  CHIEF COMPLAINT:   Chief Complaint  Patient presents with  . Fever  . Wound Check   Patient awaiting surgery denies any complaints   REVIEW OF SYSTEMS:  Review of Systems  Constitutional: Negative for chills, fever and weight loss.  HENT: Negative for nosebleeds and sore throat.   Eyes: Negative for blurred vision.  Respiratory: Negative for cough, shortness of breath and wheezing.   Cardiovascular: Negative for chest pain, orthopnea, leg swelling and PND.  Gastrointestinal: Negative for constipation, heartburn, nausea and vomiting.  Genitourinary: Negative for dysuria and urgency.  Musculoskeletal: Positive for joint pain (left foot). Negative for back pain.  Skin: Negative for rash.  Neurological: Negative for dizziness, speech change, focal weakness and headaches.  Endo/Heme/Allergies: Does not bruise/bleed easily.  Psychiatric/Behavioral: Negative for depression.   DRUG ALLERGIES:  No Known Allergies VITALS:  Blood pressure (!) 127/50, pulse 76, temperature (!) 96.7 F (35.9 C), temperature source Tympanic, resp. rate 16, height 5\' 3"  (1.6 m), weight 160 lb 9.6 oz (72.8 kg), SpO2 100 %. PHYSICAL EXAMINATION:  Physical Exam  Constitutional: She is oriented to person, place, and time and well-developed, well-nourished, and in no distress.  HENT:  Head: Normocephalic and atraumatic.  Eyes: Conjunctivae and EOM are normal. Pupils are equal, round, and reactive to light.  Neck: Normal range of motion. Neck supple. No tracheal deviation present. No thyromegaly present.  Cardiovascular: Normal rate, regular rhythm and normal heart sounds.   Pulmonary/Chest: Effort normal and breath sounds normal. No respiratory distress. She has no wheezes. She exhibits no tenderness.  Abdominal: Soft. Bowel sounds are normal. She exhibits no  distension. There is no tenderness.  Musculoskeletal: Normal range of motion.       Left foot: There is tenderness and bony tenderness.  Left heel necrotic ulcer with foul smelling drainage  Neurological: She is alert and oriented to person, place, and time. No cranial nerve deficit.  Skin: Skin is warm and dry. Lesion and rash noted.  Left heel necrotic ulcer with foul smelling drainage  Psychiatric: Mood and affect normal.   LABORATORY PANEL:   CBC  Recent Labs Lab 01/29/17 0328  WBC 5.6  HGB 8.7*  HCT 26.7*  PLT 351   ------------------------------------------------------------------------------------------------------------------ Chemistries   Recent Labs Lab 01/29/17 0328 02/01/17 0353  NA 138  --   K 4.2  --   CL 104  --   CO2 29  --   GLUCOSE 281*  --   BUN 49*  --   CREATININE 1.52* 1.33*  CALCIUM 8.4*  --    RADIOLOGY:  No results found. ASSESSMENT AND PLAN:  83 year old female with past medical history of CHF chronic diastolic, hypertension, diabetes, seizure previous CVA who presents to the hospital due to her subjective fevers and left foot ulcer.  * Left Foot diabetic ulcer/osteomyelitis- Confirmed on MRI appreciate podiatry input.   - Continued intravenous Zosyn. Stopped vanco  - Appreciate infectious disease & Vascular surgery input.  ampuation today * Diarrhea - resolved, this was likely due to laxatives which has been stopped - stool GI panel neg  * Hypotension - stop coreg and lisinopril, continue lasix  * Diabetes type 2 without complication-continue Lantus (increase to 18 units), Aspart increase to 5 units TID. sliding scale insulin. - Diabetic nurse coordinator c/s  * CHF-chronic diastolic CHF. -  Clinically patient is not in congestive heart failure. Continue Lasix, carvedilol, lisinopril.  * Hyperlipidemia-continue atorvastatin.  * COPD-no acute exacerbation-continue Dulera, albuterol inhaler as needed.  * Diabetic  neuropathy-continue gabapentin.   All the records are reviewed and case discussed with Care Management/Social Worker. Management plans discussed with the patient, RN, DR DEW and they are in agreement.  CODE STATUS: Full code  TOTAL TIME TAKING CARE OF THIS PATIENT: 32minutes.   More than 50% of the time was spent in counseling/coordination of care: YES  POSSIBLE D/C IN 2-3 DAYS, DEPENDING ON CLINICAL CONDITION.  And podiatry, vascular surgery and infectious disease workup   Auburn BilberryPATEL, Elizebeth Kluesner M.D on 02/01/2017 at 4:23 PM  Between 7am to 6pm - Pager - [phone]  After 6pm go to [email or web address] - Social research officer, governmentpassword EPAS [email or web address]  [facility] [place] [facility]  Office  [phone]  CC: Primary care physician; Barbette ReichmannHANDE,VISHWANATH, MD  Note: This dictation was prepared with Dragon dictation along with smaller phrase technology. Any transcriptional errors that result from this process are unintentional.

## 2017-02-01 NOTE — OR Nursing (Signed)
Patient had an episode of a wide complex tachycardia.  Dr. Priscella MannPenwarden notified and she just said to observe for any other episodes of same.  Patient remained in SR, borderline 1st degree heart block on monitor.

## 2017-02-01 NOTE — Progress Notes (Signed)
Pt with Below the knee amputation today but no pain medication ordered. Spoke with Dr. Madelon LipsVacchani may have percocet 5/325 q6h prn for pain.

## 2017-02-01 NOTE — Op Note (Signed)
   OPERATIVE NOTE   PROCEDURE: Left below-the-knee amputation  PRE-OPERATIVE DIAGNOSIS: Left foot gangrene  POST-OPERATIVE DIAGNOSIS: same as above  SURGEON: [name], MD  ASSISTANT(S): Dr. Renford DillsGregory G. Schnier, MD  ANESTHESIA: general  ESTIMATED BLOOD LOSS: 300 cc  FINDING(S): none  SPECIMEN(S):  Left below-the-knee amputation  INDICATIONS:   [name] [name] is a 83 y.o. female who presents with left leg gangrene/osteomyelitis.  The patient is scheduled for a left below-the-knee amputation.  I discussed in depth with the patient the risks, benefits, and alternatives to this procedure.  The patient is aware that the risk of this operation included but are not limited to:  bleeding, infection, myocardial infarction, stroke, death, failure to heal amputation wound, and possible need for more proximal amputation.  The patient is aware of the risks and agrees proceed forward with the procedure.  DESCRIPTION:  After full informed written consent was obtained from the patient, the patient was brought back to the operating room, and placed supine upon the operating table.  Prior to induction, the patient received IV antibiotics.  The patient was then prepped and draped in the standard fashion for a below-the-knee amputation.  After obtaining adequate anesthesia, the patient was prepped and draped in the standard fashion for a left below-the-knee amputation.  I marked out the anterior incision two finger breadths below the tibial tuberosity and then the marked out a posterior flap that was one third of the circumference of the calf in length.   I made the incisions for these flaps, and then dissected through the subcutaneous tissue, fascia, and muscle anteriorly.  I elevated  the periosteal tissue superiorly so that the tibia was about 3-4 cm shorter than the anterior skin flap.  I then transected the tibia with a power saw and then took a wedge off the tibia anteriorly with the power saw.  Then  I smoothed out the rough edges.  In a similar fashion, I cut back the fibula about two centimeters higher than the level of the tibia with a bone cutter.  I put a bone hook into the distal tibia and then used a large amputation knife to sharply develop a tissue plane through the muscle along the fibula.  In such fashion, the posterior flap was developed.  At this point, the specimen was passed off the field as the below-the-knee amputation.  At this point, I clamped all visibly bleeding arteries and veins using a combination of suture ligation with Silk suture and electrocautery.  Bleeding continued to be controlled with electrocautery and suture ligature.  The stump was washed off with sterile normal saline and no further active bleeding was noted.  I reapproximated the anterior and posterior fascia  with interrupted stitches of 0 Vicryl.  This was completed along the entire length of anterior and posterior fascia until there were no more loose space in the fascial line. I then placed a layer of 2-0 Vicryl sutures in the subcutaneous tissue. The skin was then  reapproximated with staples.  The stump was washed off and dried.  The incision was dressed with Xeroform and  then fluffs were applied.  Kerlix was wrapped around the leg and then gently an ACE wrap was applied.    COMPLICATIONS: none  CONDITION: stable   [name]  02/01/2017, 4:59 PM    This note was created with Dragon Medical transcription system. Any errors in dictation are purely unintentional.

## 2017-02-01 NOTE — Plan of Care (Signed)
Problem: Safety: Goal: Ability to remain free from injury will improve Outcome: Progressing Pt remained free from falls this shift. Back into chair with maximum assist.  Problem: Pain Managment: Goal: General experience of comfort will improve Outcome: Progressing No complaints of pain this shift.  Problem: Nutrition: Goal: Adequate nutrition will be maintained Outcome: Progressing Pt remained NPO after midnight for surgery later this afternoon.

## 2017-02-01 NOTE — Anesthesia Preprocedure Evaluation (Signed)
Anesthesia Evaluation  Patient identified by MRN, date of birth, ID band Patient awake    Reviewed: Allergy & Precautions, NPO status , Patient's Chart, lab work & pertinent test results  Airway Mallampati: II       Dental  (+) Missing   Pulmonary COPD, former smoker,     + decreased breath sounds      Cardiovascular hypertension, Pt. on medications and Pt. on home beta blockers + Peripheral Vascular Disease   Rhythm:Regular Rate:Normal     Neuro/Psych CVA    GI/Hepatic negative GI ROS, Neg liver ROS,   Endo/Other  diabetes, Type 1  Renal/GU negative Renal ROS     Musculoskeletal   Abdominal (+) + obese,   Peds  Hematology   Anesthesia Other Findings   Reproductive/Obstetrics                             Anesthesia Physical Anesthesia Plan  ASA: III  Anesthesia Plan: General   Post-op Pain Management:    Induction: Intravenous  Airway Management Planned: LMA  Additional Equipment:   Intra-op Plan:   Post-operative Plan: Extubation in OR  Informed Consent: I have reviewed the patients History and Physical, chart, labs and discussed the procedure including the risks, benefits and alternatives for the proposed anesthesia with the patient or authorized representative who has indicated his/her understanding and acceptance.     Plan Discussed with: CRNA  Anesthesia Plan Comments:         Anesthesia Quick Evaluation

## 2017-02-01 NOTE — Anesthesia Post-op Follow-up Note (Cosign Needed)
Anesthesia QCDR form completed.        

## 2017-02-02 ENCOUNTER — Encounter: Payer: Self-pay | Admitting: Vascular Surgery

## 2017-02-02 DIAGNOSIS — E1122 Type 2 diabetes mellitus with diabetic chronic kidney disease: Secondary | ICD-10-CM | POA: Diagnosis not present

## 2017-02-02 DIAGNOSIS — I5032 Chronic diastolic (congestive) heart failure: Secondary | ICD-10-CM | POA: Diagnosis not present

## 2017-02-02 DIAGNOSIS — N189 Chronic kidney disease, unspecified: Secondary | ICD-10-CM | POA: Diagnosis not present

## 2017-02-02 DIAGNOSIS — I130 Hypertensive heart and chronic kidney disease with heart failure and stage 1 through stage 4 chronic kidney disease, or unspecified chronic kidney disease: Secondary | ICD-10-CM | POA: Diagnosis not present

## 2017-02-02 DIAGNOSIS — L89890 Pressure ulcer of other site, unstageable: Secondary | ICD-10-CM | POA: Diagnosis not present

## 2017-02-02 LAB — GLUCOSE, CAPILLARY
GLUCOSE-CAPILLARY: 122 mg/dL — AB (ref 65–99)
Glucose-Capillary: 207 mg/dL — ABNORMAL HIGH (ref 65–99)
Glucose-Capillary: 233 mg/dL — ABNORMAL HIGH (ref 65–99)
Glucose-Capillary: 203 mg/dL — ABNORMAL HIGH (ref 65–99)

## 2017-02-02 MED ORDER — AMOXICILLIN-POT CLAVULANATE 875-125 MG PO TABS
1.0000 | ORAL_TABLET | Freq: Two times a day (BID) | ORAL | Status: DC
  Administered 2017-02-02 – 2017-02-04 (×5): 1 via ORAL
  Filled 2017-02-02 (×5): qty 1

## 2017-02-02 NOTE — Progress Notes (Signed)
ID SIgn off Pt s/p BKA WIll sign off. Follow routine recs of vascular surgery following BKA Please call if questions. No need for fu with me unless vascular recommends it

## 2017-02-02 NOTE — Evaluation (Signed)
Physical Therapy Re-Evaluation Patient Details Name: [name] [name] MRN: [id number] DOB: 07/25/1934 Today's Date: 02/02/2017   History of Present Illness  83 year old female with past medical history of CHF chronic diastolic, hypertension, diabetes, seizure, previous CVA who presents to the hospital due to her subjective fevers and left foot ulcer. L foot MRI showed chronic osteomyelitis posteriorly and inferiorly in the calcaneus.  Pt s/p L BKA 02/01/17 and new PT orders received.  Clinical Impression  PT consult received for PT re-eval post-op L BKA 02/01/17.  Pt demonstrates impaired L knee ROM, overall weakness, and significant assist levels with functional mobility.  Pt requiring mod assist x1 supine to sit; min to mod assist x2 sit to supine (and to boost up in bed), and max assist x2 to attempt partial standing and lateral scooting on edge of bed (limited ability for pt to perform this with significant 2 person assist).  Pt would benefit from skilled PT to address noted impairments and functional limitations.  Recommend pt discharge to STR when medically appropriate.     Follow Up Recommendations SNF    Equipment Recommendations   (currently recommend hoyer lift and manual w/c with pressure relieving cushion)    Recommendations for Other Services       Precautions / Restrictions Precautions Precautions: Fall Restrictions Weight Bearing Restrictions: Yes LLE Weight Bearing: Non weight bearing Other Position/Activity Restrictions: s/p L BKA      Mobility  Bed Mobility Overal bed mobility: Needs Assistance Bed Mobility: Supine to Sit;Sit to Supine     Supine to sit: Mod assist;HOB elevated Sit to supine: Min assist;Mod assist;+2 for physical assistance   General bed mobility comments: Assist for trunk and LE's; vc's for use of bedrail  Transfers Overall transfer level: Needs assistance Equipment used: None Transfers: Sit to/from Stand;Lateral/Scoot Transfers Sit to  Stand: Max assist;+2 physical assistance        Lateral/Scoot Transfers: Max assist;+2 physical assistance General transfer comment: x3 trials sit to/from stand (pt able to stand almost halfway with R knee blocked); x2 trials lateral scooting edge of bed (unable to clear bottom from bed; complicated d/t pt moving R LE and B UE's from positioning to assist); vc's and tactile cues required for UE and R LE placement during activities).  Ambulation/Gait             General Gait Details: Unable to transfer or ambulate at this time  Stairs            Wheelchair Mobility    Modified Rankin (Stroke Patients Only)       Balance Overall balance assessment: Needs assistance Sitting-balance support: Bilateral upper extremity supported (R LE supported) Sitting balance-Leahy Scale: Poor Sitting balance - Comments: pt leaning posteriorly and to R requiring min to mod assist to maintain sitting balance                                     Pertinent Vitals/Pain Pain Assessment: Faces Faces Pain Scale: Hurts a little bit Pain Location: L BKA incision site Pain Descriptors / Indicators: Guarding Pain Intervention(s): Limited activity within patient's tolerance;Monitored during session;Premedicated before session;Repositioned  Vitals (HR and O2) stable and WFL throughout treatment session.    Home Living Family/patient expects to be discharged to:: Private residence Living Arrangements: Children (Pt's daughter) Available Help at Discharge: Family Type of Home: House Home Access: Stairs to enter Entrance Stairs-Rails: Right Entrance Stairs-Number of Steps:  4 Home Layout: One level Home Equipment: Wheelchair - [employer] - 2 wheels;Bedside commode;Other (comment);Tub bench;Grab bars - tub/shower (per PT eval)      Prior Function Level of Independence: Needs assistance   Gait / Transfers Assistance Needed: Pt does not walk much; uses w/c mostly and transfers with  assist.  H/o falls.  ADL's / Homemaking Assistance Needed: Requires assist for ADLs and IADLs from family.        Hand Dominance        Extremity/Trunk Assessment   Upper Extremity Assessment Upper Extremity Assessment: Generalized weakness    Lower Extremity Assessment Lower Extremity Assessment: RLE deficits/detail;LLE deficits/detail RLE Deficits / Details: hip flexion, knee flexion/extension, and DF at least 3/5 LLE Deficits / Details: hip flexion at least 3/5; knee flexion/extension at least 2+/5       Communication   Communication: No difficulties  Cognition Arousal/Alertness: Awake/alert Behavior During Therapy: WFL for tasks assessed/performed;Anxious Overall Cognitive Status: History of cognitive impairments - at baseline   Orientation Level: Disoriented to;Time;Place;Situation (Oriented to person and DOB except for year)                  General Comments  Pt and pt's family agreeable to PT session.    Exercises Total Joint Exercises Short Arc Quad: Strengthening;Both;10 reps;Supine (AROM R; AAROM L) Heel Slides: AAROM;Strengthening;Both;10 reps;Supine Hip ABduction/ADduction: AAROM;Strengthening;Both;10 reps;Supine Straight Leg Raises: AAROM;Strengthening;Left;10 reps;Supine Goniometric ROM: L knee extension 5 degrees short of neutral; L knee flexion 70 degrees semi-supine in bed  Vc's and tactile cues required for above exercise technique.   Assessment/Plan    PT Assessment Patient needs continued PT services  PT Problem List Decreased strength;Decreased activity tolerance;Decreased range of motion;Decreased balance;Decreased mobility;Decreased cognition;Decreased knowledge of use of DME;Decreased knowledge of precautions;Decreased safety awareness;Impaired sensation;Decreased skin integrity;Pain          PT Treatment Interventions DME instruction;Gait training;Functional mobility training;Therapeutic activities;Therapeutic exercise;Neuromuscular  re-education;Balance training;Patient/family education;Cognitive remediation;Manual techniques    PT Goals (Current goals can be found in the Care Plan section)  Acute Rehab PT Goals Patient Stated Goal: Improve her strength and mobility PT Goal Formulation: With patient/family Time For Goal Achievement: 02/16/17 Potential to Achieve Goals: Fair    Frequency 7X/week   Barriers to discharge Decreased caregiver support (Level of assist)      Co-evaluation               End of Session Equipment Utilized During Treatment: Gait belt;Oxygen (2 L via nasal cannula) Activity Tolerance: Patient tolerated treatment well Patient left: in bed;with call bell/phone within reach;with bed alarm set (R heel protector donned; L LE elevated) Nurse Communication: Mobility status;Precautions         Time: 1914-7829 PT Time Calculation (min) (ACUTE ONLY): 28 min   Charges:   PT Evaluation $PT Re-evaluation: 1 Procedure PT Treatments $Therapeutic Exercise: 8-22 mins $Therapeutic Activity: 8-22 mins   PT G CodesHendricks Limes 02/28/17, 3:15 PM Hendricks Limes, PT [phone]

## 2017-02-02 NOTE — Consult Note (Signed)
   [facility]   02/02/2017  [name] 1934/12/19 [id number]    Spoke with inpatient LCSW, [name], after reading her note. Confirmed with [name] that [name] [name] will be going to SNF after all. Expressed appreciation of Bailey's collaboration and efforts in helping [name] [name] get her to the right level of care. Will update [facility] as [facility] will continue to follow.    Raiford NobleAtika Hall, MSN-Ed, RN,BSN [facility] Care [facility] Hospital Liaison [phone]

## 2017-02-02 NOTE — Plan of Care (Signed)
Patient has not voided in almost 12hr. 377cc bladder scan. Slight distention but no discomfort. Dr. Informed.

## 2017-02-02 NOTE — Progress Notes (Signed)
Inpatient Diabetes Program Recommendations  AACE/ADA: New Consensus Statement on Inpatient Glycemic Control (2015)  Target Ranges:  Prepandial:   less than 140 mg/dL      Peak postprandial:   less than 180 mg/dL (1-2 hours)      Critically ill patients:  140 - 180 mg/dL   Lab Results  Component Value Date   GLUCAP 207 (H) 02/02/2017   HGBA1C 8.6 (H) 11/11/2016    Review of Glycemic Control  Results for [name] [name], [name] [name] (MRN [id number]) as of 02/02/2017 09:39  Ref. Range 02/01/2017 07:47 02/01/2017 13:39 02/01/2017 17:02 02/01/2017 21:22 02/02/2017 07:41  Glucose-Capillary Latest Ref Range: 65 - 99 mg/dL 94 83 782103 (H) 956203 (H) 213207 (H)     Diabetes history:Type 2 Outpatient Diabetes medications: Lantus 10 units qday, Novolog 1-15 units tid  Current orders for Inpatient glycemic control: Lantus 18units qday, Novolog 0-9 units tid, Novolog 0-5 units qhs, Novolog 5 units tid with meals  Inpatient Diabetes Program Recommendations: NPO yesterday- CBG ideal.  Agree with current medications for blood sugar management.   Hold Novolog 5 units (mealtime insulin) if the patient eats less than 50%- DO NOT HOLD Novolog sliding scale even if she is not eating.   Susette RacerJulie Amonie Wisser, RN, BA, MHA, CDE Diabetes Coordinator Inpatient Diabetes Program  [phone] (Team Pager) [phone] [facility] Office) 02/02/2017 9:44 AM

## 2017-02-02 NOTE — Progress Notes (Signed)
Pt  Alert. Pt was able to tolerate some clear liquids upon return from surgery. Percocet given for pain with good results. Pt able to rest in between care. Dressing remaining dry and intact. Has been voiding without difficulty.

## 2017-02-02 NOTE — Progress Notes (Signed)
Clinical Child psychotherapistocial Worker (CSW) Interior and spatial designerdirector approved 30 day LOG for SNF. [facility]. CSW presented bed offer to patient and she accepted. CSW contacted patient's brother Jillyn HiddenGary and made him aware of above. Brother is agreeable [facility] and stated that her niece [name] will continue to pursue long term care medicaid. CSW contacted patient's niece [name] and made her aware of above. CSW emphasized to [name] that she will have to continue to work on the long term care medicaid application and removing the barriers. [name] verbalized her understanding.  Plan is for [facility] when medically [facility]. [facility] is aware of above.   Baker Hughes IncorporatedBailey Shalva Rozycki, LCSW [phone]

## 2017-02-02 NOTE — Progress Notes (Signed)
[employer] [facility] [place] [facility]   PATIENT NAME: [name] [name]    MR#:  [id number]  DATE OF BIRTH:  [birth year]  SUBJECTIVE:  CHIEF COMPLAINT:   Chief Complaint  Patient presents with  . Fever  . Wound Check   Patient underwent amputation of the lower extremity yesterday. The BKA. She is currently complaining of some pain in the leg   REVIEW OF SYSTEMS:  Review of Systems  Constitutional: Negative for chills, fever and weight loss.  HENT: Negative for nosebleeds and sore throat.   Eyes: Negative for blurred vision.  Respiratory: Negative for cough, shortness of breath and wheezing.   Cardiovascular: Negative for chest pain, orthopnea, leg swelling and PND.  Gastrointestinal: Negative for constipation, heartburn, nausea and vomiting.  Genitourinary: Negative for dysuria and urgency.  Musculoskeletal: Negative for back pain and joint pain (left foot).  Skin: Negative for rash.  Neurological: Negative for dizziness, speech change, focal weakness and headaches.  Endo/Heme/Allergies: Does not bruise/bleed easily.  Psychiatric/Behavioral: Negative for depression.   DRUG ALLERGIES:  No Known Allergies VITALS:  Blood pressure (!) 117/48, pulse 96, temperature 98.2 F (36.8 C), temperature source Oral, resp. rate 12, height 5\' 3"  (1.6 m), weight 160 lb 9.6 oz (72.8 kg), SpO2 100 %. PHYSICAL EXAMINATION:  Physical Exam  Constitutional: She is oriented to person, place, and time and well-developed, well-nourished, and in no distress.  HENT:  Head: Normocephalic and atraumatic.  Eyes: Conjunctivae and EOM are normal. Pupils are equal, round, and reactive to light.  Neck: Normal range of motion. Neck supple. No tracheal deviation present. No thyromegaly present.  Cardiovascular: Normal rate, regular rhythm and normal heart sounds.   Pulmonary/Chest: Effort normal and breath sounds normal. No respiratory distress. She has no wheezes. She exhibits no tenderness.   Abdominal: Soft. Bowel sounds are normal. She exhibits no distension. There is no tenderness.  Musculoskeletal: Normal range of motion.       Left foot: There is tenderness and bony tenderness.  Left BKA  Neurological: She is alert and oriented to person, place, and time. No cranial nerve deficit.  Skin: Lesion noted. No rash noted.  Left BKA  Psychiatric: Mood and affect normal.   LABORATORY PANEL:   CBC  Recent Labs Lab 01/29/17 0328  WBC 5.6  HGB 8.7*  HCT 26.7*  PLT 351   ------------------------------------------------------------------------------------------------------------------ Chemistries   Recent Labs Lab 01/29/17 0328 02/01/17 0353  NA 138  --   K 4.2  --   CL 104  --   CO2 29  --   GLUCOSE 281*  --   BUN 49*  --   CREATININE 1.52* 1.33*  CALCIUM 8.4*  --    RADIOLOGY:  No results found. ASSESSMENT AND PLAN:  83 year old female with past medical history of CHF chronic diastolic, hypertension, diabetes, seizure previous CVA who presents to the hospital due to her subjective fevers and left foot ulcer.  * Left Foot diabetic ulcer/osteomyelitis-  Status post bka of left Stop iv antibiotics Start oral antibiotics Case d/w dr. Wyn Quakerew recommends 3 days post op if not going to rehab, if going to rehab can go tomm   * Hypotension -Blood pressure stable  * Diabetes type 2 with circulatory complications Diabetic regimen is being adjusted  * CHF-chronic diastolic CHF. -Clinically patient is not in congestive heart failure. Continue Lasix, carvedilol, lisinopril.  * Hyperlipidemia-continue atorvastatin.  * COPD-no acute exacerbation-continue Dulera, albuterol inhaler as needed.  * Diabetic neuropathy-continue gabapentin.  All the records are reviewed and case discussed with Care Management/Social Worker. Management plans discussed with the patient, RN, DR DEW and they are in agreement.  CODE STATUS: Full code  TOTAL TIME TAKING CARE OF  THIS PATIENT: .   More than 50% of the time was spent in counseling/coordination of care: YES  POSSIBLE D/C IN 2-3 DAYS, DEPENDING ON CLINICAL CONDITION.  And podiatry, vascular surgery and infectious disease workup   Auburn Bilberry M.D on 02/02/2017 at 2:17 PM  Between 7am to 6pm - Pager - [phone]  After 6pm go to [email or web address] - Social research officer, government  [facility] [place] [facility]  Office  [phone]  CC: Primary care physician; Barbette Reichmann, MD  Note: This dictation was prepared with Dragon dictation along with smaller phrase technology. Any transcriptional errors that result from this process are unintentional.

## 2017-02-02 NOTE — Progress Notes (Signed)
Pharmacy Antibiotic Note  [name] [name] is a 83 y.o. female admitted on 01/25/2017 with Osteomyelitis.  Pharmacy has been consulted for piperacillin/tazobactam dosing. 2/14 PM left BKA  Plan: Will continue patient on piperacillin/tazobactam 3.375 IV EI every 8 hours.    Height: 5\' 3"  (160 cm) Weight: 160 lb 9.6 oz (72.8 kg) IBW/kg (Calculated) : 52.4  Temp (24hrs), Avg:97.9 F (36.6 C), Min:96.7 F (35.9 C), Max:98.7 F (37.1 C)   Recent Labs Lab 01/27/17 0503 01/28/17 0317 01/28/17 1258 01/29/17 0328 01/29/17 1219 02/01/17 0353  WBC 9.5  --   --  5.6  --   --   CREATININE 1.34* 1.67*  --  1.52*  --  1.33*  VANCOTROUGH  --   --   --   --  22*  --   VANCORANDOM  --   --  19  --   --   --     Estimated Creatinine Clearance: 32.3 mL/min (by C-G formula based on SCr of 1.33 mg/dL (H)).    No Known Allergies  Antimicrobials this admission: 2/7 Levofloxacin/cefepime x 1 dose 2/7 Vancomycin >>2/11 2/8 Zosyn  >>    Microbiology results: 2/7 BCx: NG 2/7 Wound cx: B frag  Thank you for allowing pharmacy to be a part of this patient's care.  Horris LatinoHolly Baxter Gonzalez, PharmD Pharmacy Resident 02/02/2017 8:25 AM

## 2017-02-02 NOTE — Plan of Care (Addendum)
**    Wrong patient ** Deleted text

## 2017-02-02 NOTE — Consult Note (Signed)
   [facility]   02/02/2017  [name] [name] 1934/04/11 [id number]   [facility] follow up. Discussed [name] [name] in a collaborative [facility] team meeting this morning. Spoke with [facility] RNCM as well. Telephone call made to inpatient RNCM to inquire whether [name] [name] would qualify for inpatient rehab. Also discussed whether PT could make recommendation for apparatus to protect her right foot when she does return home. Marcelino DusterMade Lisa, inpatient [facility], aware that [facility] Care [facility] will continue to follow. Will attempt to reach PT to request recommendation regarding protection for right lower extremity per [facility] MD Director.    Raiford NobleAtika Lindon Kiel, MSN-Ed, RN,BSN [facility] Care [facility] Hospital Liaison [phone]

## 2017-02-02 NOTE — Care Management (Signed)
Cm following patient disposition. S/P BKA 0214 PT pending.

## 2017-02-02 NOTE — Progress Notes (Signed)
[facility]  Daily Progress Note   Subjective  - 1 Day Post-Op  Doing well today.  Family says she already looks better.  Some post op pain.  PT getting ready to see patient  Objective Vitals:   02/01/17 2139 02/01/17 2334 02/02/17 0400 02/02/17 0743  BP: 104/81 (!) 126/52 (!) 116/52 (!) 117/48  Pulse: 94 (!) 102 (!) 105 96  Resp: 18 18 18 12   Temp: 97.9 F (36.6 C) 98.1 F (36.7 C) 98.1 F (36.7 C) 98.2 F (36.8 C)  TempSrc: Oral Oral Oral Oral  SpO2: 98% 100% 100% 100%  Weight:      Height:        Intake/Output Summary (Last 24 hours) at 02/02/17 1058 Last data filed at 02/02/17 0900  Gross per 24 hour  Intake              940 ml  Output              300 ml  Net              640 ml    PULM  CTAB CV  RRR VASC  Dressing C/D/I  Laboratory CBC    Component Value Date/Time   WBC 5.6 01/29/2017 0328   HGB 8.7 (L) 01/29/2017 0328   HGB 11.5 (L) 02/04/2014 1522   HCT 26.7 (L) 01/29/2017 0328   HCT 35.8 02/04/2014 1522   PLT 351 01/29/2017 0328   PLT 174 02/04/2014 1522    BMET    Component Value Date/Time   NA 138 01/29/2017 0328   NA 141 02/04/2014 1636   K 4.2 01/29/2017 0328   K 4.3 02/04/2014 1636   CL 104 01/29/2017 0328   CL 108 (H) 02/04/2014 1636   CO2 29 01/29/2017 0328   CO2 29 02/04/2014 1636   GLUCOSE 281 (H) 01/29/2017 0328   GLUCOSE 225 (H) 02/04/2014 1636   BUN 49 (H) 01/29/2017 0328   BUN 12 02/04/2014 1636   CREATININE 1.33 (H) 02/01/2017 0353   CREATININE 1.06 02/04/2014 1636   CALCIUM 8.4 (L) 01/29/2017 0328   CALCIUM 9.3 02/04/2014 1636   GFRNONAA 37 (L) 02/01/2017 0353   GFRNONAA 51 (L) 02/04/2014 1636   GFRAA 43 (L) 02/01/2017 0353   GFRAA 59 (L) 02/04/2014 1636    Assessment/Planning: POD #1 s/p left BKA   Doing well so far  Will need a couple of days of PT particularly since it sounds like she will have to go home and not to rehab.  Will benefit from PT/therapy as an outpatient or inpatient  rehab  Can stop IV Abx  Leave dressing on for 2-3 days    Festus BarrenJason Brigida Scotti  02/02/2017, 10:58 AM

## 2017-02-03 ENCOUNTER — Ambulatory Visit: Payer: Self-pay | Admitting: Pharmacist

## 2017-02-03 ENCOUNTER — Other Ambulatory Visit: Payer: Self-pay | Admitting: Pharmacist

## 2017-02-03 DIAGNOSIS — G309 Alzheimer's disease, unspecified: Secondary | ICD-10-CM

## 2017-02-03 DIAGNOSIS — F0280 Dementia in other diseases classified elsewhere without behavioral disturbance: Secondary | ICD-10-CM

## 2017-02-03 DIAGNOSIS — F0390 Unspecified dementia without behavioral disturbance: Secondary | ICD-10-CM

## 2017-02-03 LAB — CBC
HEMATOCRIT: 19.8 % — AB (ref 35.0–47.0)
Hemoglobin: 6.6 g/dL — ABNORMAL LOW (ref 12.0–16.0)
MCH: 27.4 pg (ref 26.0–34.0)
MCHC: 33.2 g/dL (ref 32.0–36.0)
MCV: 82.6 fL (ref 80.0–100.0)
Platelets: 262 10*3/uL (ref 150–440)
RBC: 2.4 MIL/uL — AB (ref 3.80–5.20)
RDW: 21.9 % — ABNORMAL HIGH (ref 11.5–14.5)
WBC: 9.1 10*3/uL (ref 3.6–11.0)

## 2017-02-03 LAB — GLUCOSE, CAPILLARY
GLUCOSE-CAPILLARY: 121 mg/dL — AB (ref 65–99)
GLUCOSE-CAPILLARY: 225 mg/dL — AB (ref 65–99)
Glucose-Capillary: 247 mg/dL — ABNORMAL HIGH (ref 65–99)
Glucose-Capillary: 247 mg/dL — ABNORMAL HIGH (ref 65–99)

## 2017-02-03 LAB — ABO/RH: ABO/RH(D): A POS

## 2017-02-03 LAB — PREPARE RBC (CROSSMATCH)

## 2017-02-03 LAB — HEMOGLOBIN: HEMOGLOBIN: 9.6 g/dL — AB (ref 12.0–16.0)

## 2017-02-03 LAB — SURGICAL PATHOLOGY

## 2017-02-03 MED ORDER — SODIUM CHLORIDE 0.9 % IV SOLN
Freq: Once | INTRAVENOUS | Status: AC
Start: 2017-02-03 — End: 2017-02-03
  Administered 2017-02-03: 12:00:00 via INTRAVENOUS

## 2017-02-03 MED ORDER — SODIUM CHLORIDE 0.9 % IV SOLN
Freq: Once | INTRAVENOUS | Status: AC
  Administered 2017-02-03: 07:00:00 via INTRAVENOUS

## 2017-02-03 NOTE — Progress Notes (Signed)
Blood transfusion complete at this time. Vital signs stable. No reactions noted.

## 2017-02-03 NOTE — Progress Notes (Signed)
Bladder scan showed of urine. MD notified. Order received to in and out cath patient.

## 2017-02-03 NOTE — Care Management Important Message (Signed)
Important Message  Patient Details  Name: [name] [name] MRN: [id number] Date of Birth: 08-Oct-1934   Medicare Important Message Given:  Yes Copy of initial IM given   Marily MemosLisa M Watson Robarge, RN 02/03/2017, 12:26 PM

## 2017-02-03 NOTE — Progress Notes (Signed)
[facility]  Daily Progress Note   Subjective  - 2 Days Post-Op  To get blood today.  Getting a PICC line. No major events.  Objective Vitals:   02/02/17 2032 02/03/17 0418 02/03/17 0828 02/03/17 0854  BP: (!) 121/48 (!) 117/48 (!) 111/45 (!) 107/44  Pulse: 94 97 95 95  Resp: 16 18 16 16   Temp: 99.1 F (37.3 C) 98.7 F (37.1 C) 98.4 F (36.9 C) 98.1 F (36.7 C)  TempSrc: Oral Oral Oral Oral  SpO2: 100% 100% 100% 100%  Weight:      Height:        Intake/Output Summary (Last 24 hours) at 02/03/17 1350 Last data filed at 02/03/17 0934  Gross per 24 hour  Intake              600 ml  Output                0 ml  Net              600 ml    PULM  CTAB CV  RRR VASC  Dressing C/D/I  Laboratory CBC    Component Value Date/Time   WBC 9.1 02/03/2017 0424   HGB 6.6 (L) 02/03/2017 0424   HGB 11.5 (L) 02/04/2014 1522   HCT 19.8 (L) 02/03/2017 0424   HCT 35.8 02/04/2014 1522   PLT 262 02/03/2017 0424   PLT 174 02/04/2014 1522    BMET    Component Value Date/Time   NA 138 01/29/2017 0328   NA 141 02/04/2014 1636   K 4.2 01/29/2017 0328   K 4.3 02/04/2014 1636   CL 104 01/29/2017 0328   CL 108 (H) 02/04/2014 1636   CO2 29 01/29/2017 0328   CO2 29 02/04/2014 1636   GLUCOSE 281 (H) 01/29/2017 0328   GLUCOSE 225 (H) 02/04/2014 1636   BUN 49 (H) 01/29/2017 0328   BUN 12 02/04/2014 1636   CREATININE 1.33 (H) 02/01/2017 0353   CREATININE 1.06 02/04/2014 1636   CALCIUM 8.4 (L) 01/29/2017 0328   CALCIUM 9.3 02/04/2014 1636   GFRNONAA 37 (L) 02/01/2017 0353   GFRNONAA 51 (L) 02/04/2014 1636   GFRAA 43 (L) 02/01/2017 0353   GFRAA 59 (L) 02/04/2014 1636    Assessment/Planning: POD #2 s/p left BKA   Overall doing well  To get blood for anemia.  Combination of acute blood loss from surgery and significant chronic anemia  To go to SNF tomorrow.  Please remove bulky dressing and place Kerlex and ACE wrap at discharge.  This can be changed every  other day.    Festus BarrenJason Mikelle Myrick  02/03/2017, 1:50 PM

## 2017-02-03 NOTE — Progress Notes (Signed)
PT Cancellation Note  Patient Details Name: Frederich Chickrnell Schauf MRN: [id number] DOB: [birth year]   Cancelled Treatment:    Reason Eval/Treat Not Completed: Medical issues which prohibited therapy.  Pt's Hgb 6.6 this morning and per notes is receiving a blood transfusion.  Per PT protocol, PT contraindicated for Hgb <7.1.  Will hold PT at this time and re-attempt PT treatment at a later date/time as medically appropriate.  Irving Burtonmily Viktoriya Glaspy 02/03/2017, 11:08 AM Hendricks LimesEmily Zareah Hunzeker, PT [phone]

## 2017-02-03 NOTE — Progress Notes (Signed)
Vital signs stable, no reactions noted at this time. Transfusion continues at 11420ml/hr.

## 2017-02-03 NOTE — Progress Notes (Signed)
Plan is for patient to D/C to [facility] on a 30 day LOG tomorrow, room 410-A if medically stable. Clinical Social Worker (CSW) met with patient's sister Lowella Grip at bedside who is in agreement with the plan. Sister completed admissions paper work for [employer] today. CSW faxed completed paper work to [employer] at [facility]. Patient's niece [name] is also aware of above. CSW attempted to contact patient's daughter Colletta [place] and made her aware however the phone line was busy. CSW will continue to follow and assist as needed.   [employer], LCSW [phone]

## 2017-02-03 NOTE — Progress Notes (Signed)
RN spoke with patient's niece [name] and she said that she spoke with [name], SW, about the [facility]. The niece is wanting to express her concern that she cannot make any decision for the patient and wants the social worker to call the patient's sister, Kateri McVee. RN will give [name] the phone number once she is available.   Harvie HeckMelanie Dryden Tapley, RN

## 2017-02-03 NOTE — Progress Notes (Signed)
Patient voided on own and bladder scan shows . Will continue to monitor.

## 2017-02-03 NOTE — Progress Notes (Signed)
Vital signs stable, transfusion started at this time.

## 2017-02-03 NOTE — Progress Notes (Signed)
MD notified of hgb of 6.6. MD to place orders.

## 2017-02-03 NOTE — Progress Notes (Signed)
Noted leaking from IV site at this time. Transfusion stopped. 3 nurses attempted to start a new IV site with no success. MD notified, order placed for midline for patient. Vascular access called and will be here at 1300 to start midline. Will re-infuse a new unit of blood at that time per MD order.

## 2017-02-03 NOTE — Patient Outreach (Signed)
[facility] Care Management  02/03/2017  [name] [name] July 27, 1934 [id number]  Unsuccessful telephone outreach to patient's family.  HIPAA compliant voicemail left.  Noted patient's hospitalization and unclear plan for SNF vs home after discharge.  Plan:  Will continue to follow to assist if patient returns home and follow-up on mail order prescriptions and any medication changes and needs after discharge.    Haynes Hoehnolleen Gilman Olazabal, PharmD, [facility] Clinical Pharmacist [facility] [phone]

## 2017-02-03 NOTE — Progress Notes (Signed)
Vital signs stable. Consent signed. Transfusion started at this time. Remained with patient for 15 minutes. No reactions noted. Will continue to monitor.

## 2017-02-03 NOTE — Progress Notes (Signed)
[facility] [place] [facility]   PATIENT NAME: [name] [name]    MR#:  [id number]  DATE OF BIRTH:  09-21-1934  SUBJECTIVE:  CHIEF COMPLAINT:   Chief Complaint  Patient presents with  . Fever  . Wound Check   Patient underwent amputation of the lower extremity yesterday. The BKA. She is currently complaining of some pain in the leg   REVIEW OF SYSTEMS:  Review of Systems  Constitutional: Negative for chills, fever and weight loss.  HENT: Negative for nosebleeds and sore throat.   Eyes: Negative for blurred vision.  Respiratory: Negative for cough, shortness of breath and wheezing.   Cardiovascular: Negative for chest pain, orthopnea, leg swelling and PND.  Gastrointestinal: Negative for constipation, heartburn, nausea and vomiting.  Genitourinary: Negative for dysuria and urgency.  Musculoskeletal: Negative for back pain and joint pain (left foot).  Skin: Negative for rash.  Neurological: Negative for dizziness, speech change, focal weakness and headaches.  Endo/Heme/Allergies: Does not bruise/bleed easily.  Psychiatric/Behavioral: Negative for depression.   DRUG ALLERGIES:  No Known Allergies VITALS:  Blood pressure (!) 106/39, pulse (!) 107, temperature 99 F (37.2 C), temperature source Oral, resp. rate 16, height 5\' 3"  (1.6 m), weight 160 lb 9.6 oz (72.8 kg), SpO2 100 %. PHYSICAL EXAMINATION:  Physical Exam  Constitutional: She is oriented to person, place, and time and well-developed, well-nourished, and in no distress.  HENT:  Head: Normocephalic and atraumatic.  Eyes: Conjunctivae and EOM are normal. Pupils are equal, round, and reactive to light.  Neck: Normal range of motion. Neck supple. No tracheal deviation present. No thyromegaly present.  Cardiovascular: Normal rate, regular rhythm and normal heart sounds.   Pulmonary/Chest: Effort normal and breath sounds normal. No respiratory distress. She has no wheezes. She exhibits no  tenderness.  Abdominal: Soft. Bowel sounds are normal. She exhibits no distension. There is no tenderness.  Musculoskeletal: Normal range of motion.       Left foot: There is tenderness and bony tenderness.  Left BKA  Neurological: She is alert and oriented to person, place, and time. No cranial nerve deficit.  Skin: Lesion noted. No rash noted.  Left BKA  Psychiatric: Mood and affect normal.   LABORATORY PANEL:   CBC  Recent Labs Lab 02/03/17 0424  WBC 9.1  HGB 6.6*  HCT 19.8*  PLT 262   ------------------------------------------------------------------------------------------------------------------ Chemistries   Recent Labs Lab 01/29/17 0328 02/01/17 0353  NA 138  --   K 4.2  --   CL 104  --   CO2 29  --   GLUCOSE 281*  --   BUN 49*  --   CREATININE 1.52* 1.33*  CALCIUM 8.4*  --    RADIOLOGY:  No results found. ASSESSMENT AND PLAN:  83 year old female with past medical history of CHF chronic diastolic, hypertension, diabetes, seizure previous CVA who presents to the hospital due to her subjective fevers and left foot ulcer.  * Left Foot diabetic ulcer/osteomyelitis-  Status post bka of left Stop iv antibiotics  3 more days oral antibiotics Dressing changes per Dr. Wyn Quaker on discharge :Please remove bulky dressing and place Kerlex and ACE wrap at discharge.  This can be changed every other day.   *Acute blood loss anemia patient is being transfused 1 unit of packed RBCs will recheck hemoglobin and transfuse as needed This is due to combination of surgery as well as chronic anemia   * Hypotension -Blood pressure stable  * Diabetes type 2 with  circulatory complications Diabetic regimen is being adjustedAs per her sugars  * CHF-chronic diastolic CHF. -Clinically patient is not in congestive heart failure. Continue Lasix, carvedilol, lisinopril.  * Hyperlipidemia-continue atorvastatin.  * COPD-no acute exacerbation-continue Dulera, albuterol inhaler as  needed.  * Diabetic neuropathy-continue gabapentin.  * compentancy  Child psychotherapistsocial worker requesting psych eval patient's competency All the records are reviewed and case discussed with Care Management/Social Worker. Management plans discussed with the patient, RN, DR DEW and they are in agreement.  CODE STATUS: Full code  TOTAL TIME TAKING CARE OF THIS PATIENT: 30min.   More than 50% of the time was spent in counseling/coordination of care: YES  POSSIBLE D/C IN 2-3 DAYS, DEPENDING ON CLINICAL CONDITION.  And podiatry, vascular surgery and infectious disease workup   Auburn BilberryPATEL, Safiatou Islam M.D on 02/03/2017 at 2:21 PM  Between 7am to 6pm - Pager - [phone]  After 6pm go to [email or web address] - Social research officer, governmentpassword [email or web address] [email or web address]  [facility] [facility]  Office  [phone]  CC: Primary care physician; Barbette ReichmannHANDE,VISHWANATH, MD  Note: This dictation was prepared with Dragon dictation along with smaller phrase technology. Any transcriptional errors that result from this process are unintentional.

## 2017-02-03 NOTE — Progress Notes (Signed)
Clinical Child psychotherapistocial Worker (CSW) received a call from patient's niece [name] this morning stating that patient's sister Kateri McVee and daughter Judeth CornfieldStephanie make the decisions for patient and may not want [facility] [place]. CSW contacted patient's daughter Judeth CornfieldStephanie to discuss D/C plan. Per Judeth CornfieldStephanie she lives with patient but does not provide 24/7 care. CSW emphasized that patient is a new BKA and max assist to sit up and will need 24/7 care. CSW explained explained that the wound will have to be kept clean or it could get infected. Judeth CornfieldStephanie verbalized her understanding and reported that she does not make decisions for patient. Per Judeth CornfieldStephanie the patient can make her own decisions. CSW explained to daughter that is patient is confused and it is unclear if she can make her own decisions. CSW also attempted to contact patient's sister Vee however she did not answer and a voicemail was left. CSW requested RN to order psych consult to determine if patient has capacity to make her own decisions.   Baker Hughes IncorporatedBailey Chandria Rookstool, LCSW [phone]

## 2017-02-03 NOTE — Consult Note (Signed)
[facility] Face-to-Face Psychiatry Consult   Reason for Consult:  Consult for 83 year old woman who is currently in the hospital having an amputation. Question arose about decision-making capacity Referring Physician:  Allena Katz Patient Identification: [name] [name] MRN:  [id number] Principal Diagnosis: Dementia Diagnosis:   Patient Active Problem List   Diagnosis Date Noted  . Dementia [F03.90] 02/03/2017  . Pressure ulcer of left heel, stage 4 (HCC) [L89.624] 01/26/2017  . Acute osteomyelitis of left foot (HCC) [M86.172] 01/25/2017  . Pressure injury of skin [L89.90] 12/26/2016  . Acute respiratory distress [R06.03] 11/21/2016  . Type 1 diabetes mellitus with diabetic heel ulcer (HCC) [N56.213, L97.409] 11/09/2016  . Acute respiratory failure with hypoxia (HCC) [J96.01] 09/15/2016  . Carpal tunnel syndrome [G56.00] 09/01/2016  . Hypertension [I10] 09/01/2016  . PVD (peripheral vascular disease) (HCC) [I73.9] 09/01/2016  . Open abdominal wall wound [S31.109A] 09/01/2016  . Abscess [L02.91] 08/21/2016  . Type 2 diabetes mellitus with complication (HCC) [E11.8] 05/20/2015  . Visual disturbance [H53.9] 05/20/2015  . H/O: CVA (cerebrovascular accident) [Z86.73] 01/21/2015  . Difficulty in walking [R26.2] 08/27/2014  . Diabetes (HCC) [E11.9] 08/20/2014  . Diabetic neuropathy (HCC) [E11.40] 08/20/2014  . Dizzy spells [R42] 08/20/2014  . Hand numbness [R20.0] 08/20/2014  . Obesity [E66.9] 08/20/2014    Total Time spent with patient: 1 hour  Subjective:   [name] [name] is a 83 y.o. female patient admitted with "they cut my leg off".  HPI:  Patient interviewed. Chart reviewed. 83 year old woman currently in the hospital having an amputation. Apparently there is still a little bit of question about discharge planning. Concern arose about her capacity. On interview today the patient was able to tell me that she had had her leg cutoff but when asked where she was she insisted that she was at her  father's house. She was not able to tell me the year but got it mixed up with the idea of a month. [name] able to tell me that she stays with her daughter [name] [name] and that her daughter [name] [name] has recently not been able to do her care completely because [name] [name] herself broke her leg. [name] able to give me some clear information but is unclear and garbled about a lot of other things. She is not reporting any depression not reporting any suicidal ideation not reporting any hallucinations. She does not however know where she is likely to go when she is discharged and when we started to discuss that she got very confused. Couldn't follow the question at all even though I asked her several times in as simple a manner as I could. Got distracted and started talking about her money and couldn't get off of that topic. She was not able to tell me what kind of medical problem she has at baseline. She was not able to give me any idea that she understood what sort of care she would be needing in the future. She did however indicate that she had placed her trust in her daughter [name] [name] to make decisions for her.  Social history: Had been living with her daughter. Apparently there is concern that her daughter can't provide the necessary care right now so the patient is likely to go to another kind of facility.  Medical history: Status post amputation of her right leg. Diabetes high blood pressure  Substance abuse history: Nonidentified patient denies any  Past Psychiatric History: No past psychiatric history known. Nothing in the chart about it. Patient denies knowing of any past psychiatric treatment  at all.  Risk to Self: Is patient at risk for suicide?: No Risk to Others:   Prior Inpatient Therapy:   Prior Outpatient Therapy:    Past Medical History:  Past Medical History:  Diagnosis Date  . Diabetes mellitus without complication (HCC)   . Hypertension   . Stroke [facility])     Past Surgical  History:  Procedure Laterality Date  . AMPUTATION Left 02/01/2017   Procedure: AMPUTATION BELOW KNEE;  Surgeon: Annice Needy, MD;  Location: [facility] ORS;  Service: Vascular;  Laterality: Left;  . LOWER EXTREMITY ANGIOGRAPHY Left 01/30/2017   Procedure: Lower Extremity Angiography;  Surgeon: Annice Needy, MD;  Location: [facility] INVASIVE CV LAB;  Service: Cardiovascular;  Laterality: Left;  . LOWER EXTREMITY INTERVENTION  01/30/2017   Procedure: Lower Extremity Intervention;  Surgeon: Annice Needy, MD;  Location: [facility] INVASIVE CV LAB;  Service: Cardiovascular;;  . PERIPHERAL VASCULAR CATHETERIZATION N/A 11/14/2016   Procedure: Lower Extremity Angiography;  Surgeon: Annice Needy, MD;  Location: [facility] INVASIVE CV LAB;  Service: Cardiovascular;  Laterality: N/A;  . PERIPHERAL VASCULAR CATHETERIZATION Right 11/21/2016   Procedure: Lower Extremity Angiography;  Surgeon: Annice Needy, MD;  Location: [facility] INVASIVE CV LAB;  Service: Cardiovascular;  Laterality: Right;  . PERIPHERAL VASCULAR CATHETERIZATION N/A 11/21/2016   Procedure: Abdominal Aortogram w/Lower Extremity;  Surgeon: Annice Needy, MD;  Location: [facility] INVASIVE CV LAB;  Service: Cardiovascular;  Laterality: N/A;  . PERIPHERAL VASCULAR CATHETERIZATION  11/21/2016   Procedure: Lower Extremity Intervention;  Surgeon: Annice Needy, MD;  Location: [facility] INVASIVE CV LAB;  Service: Cardiovascular;;   Family History:  Family History  Problem Relation Age of Onset  . Stroke Mother   . Stroke Father   . Cancer Father   . Heart disease Paternal Grandfather   . Diabetes Sister   . Diabetes Brother    Family Psychiatric  History: She is not aware of any family history Social History:  History  Alcohol Use No     History  Drug Use No    Social History   Social History  . Marital status: Married    Spouse name: N/A  . Number of children: N/A  . Years of education: N/A   Social History Main Topics  . Smoking status: Former Smoker    Types: Cigarettes  .  Smokeless tobacco: Never Used  . Alcohol use No  . Drug use: No  . Sexual activity: Not Asked   Other Topics Concern  . None   Social History Narrative  . None   Additional Social History:    Allergies:  No Known Allergies  Labs:  Results for orders placed or performed during the hospital encounter of 01/25/17 (from the past 48 hour(s))  Glucose, capillary     Status: Abnormal   Collection Time: 02/01/17  9:22 PM  Result Value Ref Range   Glucose-Capillary 203 (H) 65 - 99 mg/dL  Glucose, capillary     Status: Abnormal   Collection Time: 02/02/17  7:41 AM  Result Value Ref Range   Glucose-Capillary 207 (H) 65 - 99 mg/dL  Glucose, capillary     Status: Abnormal   Collection Time: 02/02/17 11:18 AM  Result Value Ref Range   Glucose-Capillary 233 (H) 65 - 99 mg/dL   Comment 1 Notify RN    Comment 2 Document in Chart   Glucose, capillary     Status: Abnormal   Collection Time: 02/02/17  4:02 PM  Result Value Ref  Range   Glucose-Capillary 203 (H) 65 - 99 mg/dL   Comment 1 Notify RN    Comment 2 Document in Chart   Glucose, capillary     Status: Abnormal   Collection Time: 02/02/17  9:52 PM  Result Value Ref Range   Glucose-Capillary 122 (H) 65 - 99 mg/dL   Comment 1 Notify RN   CBC     Status: Abnormal   Collection Time: 02/03/17  4:24 AM  Result Value Ref Range   WBC 9.1 3.6 - 11.0 K/uL   RBC 2.40 (L) 3.80 - 5.20 MIL/uL   Hemoglobin 6.6 (L) 12.0 - 16.0 g/dL   HCT 40.9 (L) 81.1 - 91.4 %   MCV 82.6 80.0 - 100.0 fL   MCH 27.4 26.0 - 34.0 pg   MCHC 33.2 32.0 - 36.0 g/dL   RDW 78.2 (H) 95.6 - 21.3 %   Platelets 262 150 - 440 K/uL  ABO/Rh     Status: None   Collection Time: 02/03/17  4:24 AM  Result Value Ref Range   ABO/RH(D) A POS   Prepare RBC     Status: None   Collection Time: 02/03/17  7:30 AM  Result Value Ref Range   Order Confirmation ORDER PROCESSED BY BLOOD BANK   Glucose, capillary     Status: Abnormal   Collection Time: 02/03/17  7:56 AM  Result  Value Ref Range   Glucose-Capillary 121 (H) 65 - 99 mg/dL   Comment 1 Notify RN    Comment 2 Document in Chart   Glucose, capillary     Status: Abnormal   Collection Time: 02/03/17 11:22 AM  Result Value Ref Range   Glucose-Capillary 225 (H) 65 - 99 mg/dL  Prepare RBC     Status: None   Collection Time: 02/03/17 12:30 PM  Result Value Ref Range   Order Confirmation ORDER PROCESSED BY BLOOD BANK   Glucose, capillary     Status: Abnormal   Collection Time: 02/03/17  3:47 PM  Result Value Ref Range   Glucose-Capillary 247 (H) 65 - 99 mg/dL   Comment 1 Notify RN    Comment 2 Document in Chart     Current Facility-Administered Medications  Medication Dose Route Frequency Provider Last Rate Last Dose  . acetaminophen (TYLENOL) tablet 650 mg  650 mg Oral Q6H PRN Houston Siren, MD   650 mg at 01/29/17 1503   Or  . acetaminophen (TYLENOL) suppository 650 mg  650 mg Rectal Q6H PRN Houston Siren, MD      . albuterol (PROVENTIL) (2.5 MG/3ML) 0.083% nebulizer solution 2.5 mg  2.5 mg Nebulization Q6H PRN Delsa Bern, RPH      . amoxicillin-clavulanate (AUGMENTIN) 875-125 MG per tablet 1 tablet  1 tablet Oral Q12H Auburn Bilberry, MD   1 tablet at 02/03/17 1019  . aspirin EC tablet 81 mg  81 mg Oral Daily Houston Siren, MD   81 mg at 02/03/17 1020  . atorvastatin (LIPITOR) tablet 10 mg  10 mg Oral QHS Houston Siren, MD   10 mg at 02/02/17 2209  . clopidogrel (PLAVIX) tablet 75 mg  75 mg Oral Daily Houston Siren, MD   75 mg at 02/03/17 1019  . enoxaparin (LOVENOX) injection 40 mg  40 mg Subcutaneous Q24H Vipul Shah, MD   40 mg at 02/02/17 2209  . furosemide (LASIX) tablet 20 mg  20 mg Oral BID Houston Siren, MD   20 mg at 02/03/17  1610  . gabapentin (NEURONTIN) capsule 200 mg  200 mg Oral BID Houston Siren, MD   200 mg at 02/03/17 1019  . insulin aspart (novoLOG) injection 0-5 Units  0-5 Units Subcutaneous QHS Houston Siren, MD   2 Units at 02/01/17 2132  . insulin aspart  (novoLOG) injection 0-9 Units  0-9 Units Subcutaneous TID WC Houston Siren, MD   3 Units at 02/03/17 1214  . insulin aspart (novoLOG) injection 5 Units  5 Units Subcutaneous TID WC Delfino Lovett, MD   5 Units at 02/03/17 1215  . insulin glargine (LANTUS) injection 18 Units  18 Units Subcutaneous Daily Delfino Lovett, MD   18 Units at 02/03/17 1020  . loratadine (CLARITIN) tablet 10 mg  10 mg Oral Daily Houston Siren, MD   10 mg at 02/03/17 1020  . mometasone-formoterol (DULERA) 200-5 MCG/ACT inhaler 2 puff  2 puff Inhalation BID Houston Siren, MD   2 puff at 02/03/17 0810  . ondansetron (ZOFRAN) tablet 4 mg  4 mg Oral Q6H PRN Houston Siren, MD       Or  . ondansetron [facility]) injection 4 mg  4 mg Intravenous Q6H PRN Houston Siren, MD   4 mg at 02/01/17 1912  . oxyCODONE-acetaminophen (PERCOCET/ROXICET) 5-325 MG per tablet 1 tablet  1 tablet Oral Q6H PRN Altamese Dilling, MD   1 tablet at 02/02/17 0505  . Vitamin D (Ergocalciferol) (DRISDOL) capsule 50,000 Units  50,000 Units Oral Weekly Houston Siren, MD   50,000 Units at 02/01/17 2019    Musculoskeletal: Strength & Muscle Tone: decreased Gait & Station: unable to stand Patient leans: N/A  Psychiatric Specialty Exam: Physical Exam  Nursing note and vitals reviewed. Constitutional: She appears well-developed and well-nourished.  HENT:  Head: Normocephalic and atraumatic.  Eyes: Conjunctivae are normal. Pupils are equal, round, and reactive to light.  Neck: Normal range of motion.  Cardiovascular: Regular rhythm and normal heart sounds.   Respiratory: Effort normal.  GI: Soft.  Musculoskeletal: Normal range of motion.       Legs: Neurological: She is alert.  Skin: Skin is warm and dry.  Psychiatric: Judgment normal. Her affect is blunt. Her speech is delayed. She is slowed. Thought content is not paranoid. She expresses no homicidal and no suicidal ideation. She exhibits abnormal recent memory and abnormal remote memory.      Review of Systems  Unable to perform ROS: Mental status change  Psychiatric/Behavioral: Negative for depression, substance abuse and suicidal ideas.    Blood pressure (!) 119/42, pulse 100, temperature 99.1 F (37.3 C), temperature source Oral, resp. rate 18, height 5\' 3"  (1.6 m), weight 72.8 kg (160 lb 9.6 oz), SpO2 100 %.Body mass index is 28.45 kg/m.  General Appearance: Casual  Eye Contact:  Fair  Speech:  Slow  Volume:  Decreased  Mood:  Euthymic  Affect:  Constricted  Thought Process:  Disorganized  Orientation:  Negative  Thought Content:  Illogical and Rumination  Suicidal Thoughts:  No  Homicidal Thoughts:  No  Memory:  Immediate;   Fair Recent;   Poor Remote;   Poor  Judgement:  Impaired  Insight:  Shallow  Psychomotor Activity:  Decreased  Concentration:  Concentration: Poor  Recall:  Fair  Fund of Knowledge:  Fair  Language:  Fair  Akathisia:  No  Handed:  Right  AIMS (if indicated):     Assets:  Communication Skills Desire for Improvement Financial Resources/Insurance Social Support  ADL's:  Impaired  Cognition:  Impaired,  Mild and Moderate  Sleep:        Treatment Plan Summary: Plan 83 year old woman who clinically is showing symptoms of dementia. Confused about where she is. Very poor memory and couldn't remember anything at 3 minutes. Could not answer questions about the future. Became easily confused. At this point I don't think the patient has the capacity to make reasonable decisions. If this is in part delirium related to medical issues that could change in the future but at least to today's exam she does not have capacity. Fortunately she is not expressing any paranoia and expresses some trust in her daughter whom she says has been taking care of her so far. No indication for any specific psychiatric medicine. If she is here after the weekend I will follow-up.  Disposition: Patient does not meet criteria for psychiatric inpatient  admission. Supportive therapy provided about ongoing stressors.  Mordecai RasmussenJohn Hanh Kertesz, MD 02/03/2017 5:05 PM

## 2017-02-04 DIAGNOSIS — I959 Hypotension, unspecified: Secondary | ICD-10-CM

## 2017-02-04 DIAGNOSIS — D62 Acute posthemorrhagic anemia: Secondary | ICD-10-CM

## 2017-02-04 DIAGNOSIS — Z89512 Acquired absence of left leg below knee: Secondary | ICD-10-CM

## 2017-02-04 LAB — TYPE AND SCREEN
ABO/RH(D): A POS
Antibody Screen: NEGATIVE
UNIT DIVISION: 0
Unit division: 0

## 2017-02-04 LAB — BASIC METABOLIC PANEL
Anion gap: 6 (ref 5–15)
BUN: 22 mg/dL — ABNORMAL HIGH (ref 6–20)
CALCIUM: 8.1 mg/dL — AB (ref 8.9–10.3)
CO2: 31 mmol/L (ref 22–32)
CREATININE: 1.13 mg/dL — AB (ref 0.44–1.00)
Chloride: 103 mmol/L (ref 101–111)
GFR calc non Af Amer: 45 mL/min — ABNORMAL LOW (ref >60)
GFR, EST AFRICAN AMERICAN: 52 mL/min — AB (ref >60)
Glucose, Bld: 249 mg/dL — ABNORMAL HIGH (ref 65–99)
Potassium: 3.9 mmol/L (ref 3.5–5.1)
SODIUM: 140 mmol/L (ref 135–145)

## 2017-02-04 LAB — CBC
HCT: 26.6 % — ABNORMAL LOW (ref 35.0–47.0)
Hemoglobin: 8.9 g/dL — ABNORMAL LOW (ref 12.0–16.0)
MCH: 28.2 pg (ref 26.0–34.0)
MCHC: 33.6 g/dL (ref 32.0–36.0)
MCV: 83.9 fL (ref 80.0–100.0)
Platelets: 245 10*3/uL (ref 150–440)
RBC: 3.16 MIL/uL — ABNORMAL LOW (ref 3.80–5.20)
RDW: 19.6 % — AB (ref 11.5–14.5)
WBC: 9.3 10*3/uL (ref 3.6–11.0)

## 2017-02-04 LAB — GLUCOSE, CAPILLARY
GLUCOSE-CAPILLARY: 196 mg/dL — AB (ref 65–99)
Glucose-Capillary: 252 mg/dL — ABNORMAL HIGH (ref 65–99)
Glucose-Capillary: 203 mg/dL — ABNORMAL HIGH (ref 65–99)

## 2017-02-04 MED ORDER — AMOXICILLIN-POT CLAVULANATE 875-125 MG PO TABS: 1.0000 | ORAL_TABLET | Freq: Two times a day (BID) | ORAL | 0 refills | Status: DC

## 2017-02-04 MED ORDER — OXYCODONE-ACETAMINOPHEN 5-325 MG PO TABS: 1.0000 | ORAL_TABLET | Freq: Four times a day (QID) | ORAL | 0 refills | Status: AC | PRN

## 2017-02-04 MED ORDER — INSULIN GLARGINE 100 UNIT/ML ~~LOC~~ SOLN: 18.0000 [IU] | Freq: Every day | SUBCUTANEOUS | 11 refills | Status: AC

## 2017-02-04 NOTE — Progress Notes (Signed)
Spoke with pt's family (sister). Pt will be discharging later today.

## 2017-02-04 NOTE — Progress Notes (Signed)
[facility] Pt will be transported via EMS. LMOM to family to notify of transfer.

## 2017-02-04 NOTE — Clinical Social Work Note (Signed)
[facility]. Facility is aware. CSW attempted to alert family and left VM with each contact number. CSW will con't to follow pending additional dc needs.  Argentina PonderKaren Martha Capers Hagmann, MSW, Theresia MajorsLCSWA [phone]

## 2017-02-04 NOTE — Progress Notes (Signed)
Physical Therapy Treatment Patient Details Name: Frederich Chickrnell Walling MRN: [id number] DOB: [birth year] Today's Date: 02/04/2017    History of Present Illness 83 year old female with past medical history of CHF chronic diastolic, hypertension, diabetes, seizure, previous CVA who presents to the hospital due to her subjective fevers and left foot ulcer. L foot MRI showed chronic osteomyelitis posteriorly and inferiorly in the calcaneus.  Pt s/p L BKA 02/01/17 and new PT orders received.    PT Comments    Participated in exercises as described below.  Pt allows limited ROM LLE with exercises due to pain and hesitation.  Rolling left and right in bed with rail and encouragement.  Min assist to get fully onto either side.  Sidelying to sit on left side of bed with mod a x 1.  Once sitting she is able to maintain sitting with BUE support and min guard/min assist x 1.  Unsafe to be left sitting unattended.  She was able to sit for 7 minutes today before fatigue with overall improved quality.       Follow Up Recommendations  SNF     Equipment Recommendations       Recommendations for Other Services  Pt did have some food pocketing from breakfast upon entering room.  She seemed unaware of it and needed encouragement to finish swallowing.  Pt may benefit from a speech evaluation.      Precautions / Restrictions Precautions Precautions: Fall Restrictions Weight Bearing Restrictions: Yes LLE Weight Bearing: Non weight bearing Other Position/Activity Restrictions: s/p L BKA    Mobility  Bed Mobility Overal bed mobility: Needs Assistance Bed Mobility: Rolling;Sidelying to Sit;Sit to Supine Rolling: Min assist Sidelying to sit: Min assist Supine to sit: Mod assist;HOB elevated Sit to supine: Mod assist   General bed mobility comments: requires verbal cues and encouragement  Transfers                 General transfer comment: did not attempt standing today  Ambulation/Gait              General Gait Details: Unable to transfer or ambulate at this time   Stairs            Wheelchair Mobility    Modified Rankin (Stroke Patients Only)       Balance Overall balance assessment: Needs assistance Sitting-balance support: Bilateral upper extremity supported Sitting balance-Leahy Scale: Poor Sitting balance - Comments: able to maintain upright holding rail and onto mattress.  post lean.  Hesitatnt to shift weight forward while sitting/trunk flexion                            Cognition Arousal/Alertness: Awake/alert Behavior During Therapy: WFL for tasks assessed/performed Overall Cognitive Status: History of cognitive impairments - at baseline                      Exercises Other Exercises Other Exercises: sitting edge of bed x 7 minutes with min guard/min assist Other Exercises: supine exercises for ankle pumps, SLR, Heel slides, SAQ, Ab/add 2 x 10 Other Exercises: seated LLE knee flex/extension x 10    General Comments        Pertinent Vitals/Pain Pain Assessment: Faces Faces Pain Scale: Hurts a little bit Pain Location: L BKA incision site  increased pain with movement Pain Descriptors / Indicators: Guarding;Sore Pain Intervention(s): Limited activity within patient's tolerance;Monitored during session    Home Living  Prior Function            PT Goals (current goals can now be found in the care plan section) Progress towards PT goals: Progressing toward goals    Frequency    7X/week      PT Plan Current plan remains appropriate    Co-evaluation             End of Session Equipment Utilized During Treatment: Oxygen Activity Tolerance: Patient tolerated treatment well Patient left: in bed;with call bell/phone within reach;with bed alarm set     Time: 9604-5409 PT Time Calculation (min) (ACUTE ONLY): 30 min  Charges:  $Gait Training: 8-22 mins $Therapeutic Exercise:  8-22 mins                    G Codes:      Danielle Dess 02/28/2017, 9:53 AM

## 2017-02-04 NOTE — Discharge Summary (Signed)
[facility] [place] [facility]   PATIENT NAME: [name] [name]    MR#:  [id number]  DATE OF BIRTH:  1934-07-03  DATE OF ADMISSION:  01/25/2017 ADMITTING PHYSICIAN: Houston Siren, MD  DATE OF DISCHARGE: No discharge date for patient encounter.  PRIMARY CARE PHYSICIAN: HANDE,VISHWANATH, MD     ADMISSION DIAGNOSIS:  Acute pulmonary edema (HCC) [J81.0] Osteomyelitis (HCC) [M86.9] Acute respiratory failure with hypoxia (HCC) [J96.01] Generalized weakness [R53.1] Anemia, unspecified type [D64.9] Osteomyelitis of left foot, unspecified type (HCC) [M86.9]  DISCHARGE DIAGNOSIS:  Principal Problem:   Acute osteomyelitis of left foot (HCC) Active Problems:   Pressure ulcer of left heel, stage 4 (HCC)   Dementia   S/P BKA (below knee amputation), left (HCC)   Acute posthemorrhagic anemia   Hypotension   SECONDARY DIAGNOSIS:   Past Medical History:  Diagnosis Date  . Diabetes mellitus without complication (HCC)   . Hypertension   . Stroke (HCC)     .pro HOSPITAL COURSE:   The patient is a 83 year old female with past medical history significant for history of chronic diastolic CHF, hypertension, diabetes, seizure disorder, previous stroke, who presents to the hospital with complaints of subjective fevers, left foot ulcer, drainage, pain. X-ray in emergency room revealed osteomyelitis of the heel. She was seen by vascular surgeon, Dr. Wyn Quaker, who discussed with podiatry service, that did not feel that patient's foot can be salvaged. The patient underwent percutaneous transluminal angioplasty of tibial peroneal trunk and distal popliteal artery with 4 mm diameter to 10 cm length 01/30/2017 by Dr. Wyn Quaker, she underwent below knee amputation of left leg on 10/01/2017. Postoperatively, she was noted to be anemic and hypotensive, requiring blood transfusion. After transfusion, patient's hemoglobin level improved to 8.9 on the day of discharge, blood pressure  improved to 132/56. Patient was felt to be stable to be discharged to skilled nursing facility for rehabilitation. Discussion by problem:  * Left Foot diabetic ulcer/osteomyelitis-  Status post left bka Now, off IV antibiotics, the patient is to continue 3 more days of oral antibiotics/Augmentin Dressing changes per Dr. Wyn Quaker on discharge :Please remove bulky dressing and place Kerlex and ACE wrap at discharge. This can be changed every other day.   *Acute blood loss anemia patient , status post 1 unit of packed RBCs transfusion, hemoglobin level has improved to 8.9 on the day of discharge. It is recommended to follow patient's hemoglobin level as outpatient and consider iron supplementation if needed. This is due to combination of surgery as well as chronic anemia   * Hypotension -Blood pressure has improved after transfusion  * Diabetes type 2 with circulatory complications Diabetic regimen is being adjusted as per her sugars, blood glucose levels are ranging between 122 250, follow patient's blood glucose levels and advanced medications as needed  * CHF-chronic diastolic CHF. -Clinically patient is not in congestive heart failure. Continue Lasix, carvedilol, lisinopril.  * Hyperlipidemia-continue atorvastatin.  * COPD-no acute exacerbation-continue Dulera, albuterol inhaler as needed.  * Diabetic neuropathy-continue gabapentin.  *Dementia, supportive therapy, she is incompetent to make decisions for medical care, per psychiatrist  DISCHARGE CONDITIONS:   Stable  CONSULTS OBTAINED:  Treatment Team:  Recardo Evangelist, DPM Annice Needy, MD Audery Amel, MD  DRUG ALLERGIES:  No Known Allergies  DISCHARGE MEDICATIONS:   Current Discharge Medication List    START taking these medications   Details  amoxicillin-clavulanate (AUGMENTIN) 875-125 MG tablet Take 1 tablet by mouth every 12 (twelve) hours. Qty: 6  tablet, Refills: 0    oxyCODONE-acetaminophen  (PERCOCET/ROXICET) 5-325 MG tablet Take 1 tablet by mouth every 6 (six) hours as needed for moderate pain or severe pain. Qty: 30 tablet, Refills: 0      CONTINUE these medications which have CHANGED   Details  insulin glargine (LANTUS) 100 UNIT/ML injection Inject 0.18 mLs (18 Units total) into the skin daily. Qty: 10 mL, Refills: 11      CONTINUE these medications which have NOT CHANGED   Details  acetaminophen (TYLENOL) 325 MG tablet Take 2 tablets (650 mg total) by mouth every 6 (six) hours as needed for mild pain (temp > 101.5).    albuterol (PROVENTIL HFA;VENTOLIN HFA) 108 (90 Base) MCG/ACT inhaler Inhale 2 puffs into the lungs every 6 (six) hours as needed for wheezing or shortness of breath. Qty: 1 Inhaler, Refills: 1    aspirin 81 MG tablet Chew 81 mg by mouth daily.    atorvastatin (LIPITOR) 10 MG tablet Take 1 tablet by mouth at bedtime.     carvedilol (COREG) 3.125 MG tablet Take 1 tablet (3.125 mg total) by mouth 2 (two) times daily with a meal. Qty: 30 tablet, Refills: 0    clopidogrel (PLAVIX) 75 MG tablet Take 1 tablet (75 mg total) by mouth daily. Qty: 30 tablet, Refills: 1    collagenase (SANTYL) ointment Apply topically daily. Qty: 15 g, Refills: 0    diphenhydrAMINE (BENADRYL) 25 mg capsule Take 25 mg by mouth every 6 (six) hours as needed for itching.    fluticasone-salmeterol (ADVAIR HFA) 115-21 MCG/ACT inhaler Inhale 2 puffs into the lungs 2 (two) times daily. Qty: 1 Inhaler, Refills: 0    furosemide (LASIX) 20 MG tablet Take 1 tablet (20 mg total) by mouth 2 (two) times daily. Qty: 60 tablet, Refills: 0    gabapentin (NEURONTIN) 100 MG capsule Take 2 capsules by mouth 2 (two) times daily. Take 2 capsules (200mg ) twice a day    insulin aspart (NOVOLOG) 100 UNIT/ML injection Inject 0-15 Units into the skin 3 (three) times daily with meals. Qty: 100 mL, Refills: 0    lisinopril (PRINIVIL,ZESTRIL) 2.5 MG tablet Take 1 tablet (2.5 mg total) by mouth  daily. Qty: 30 tablet, Refills: 0    loratadine (CLARITIN) 10 MG tablet Take 10 mg by mouth daily.    potassium chloride SA (K-DUR,KLOR-CON) 20 MEQ tablet Take 20 mEq by mouth daily.    Vitamin D, Ergocalciferol, (DRISDOL) 50000 units CAPS capsule Take 1 capsule by mouth once a week.         DISCHARGE INSTRUCTIONS:    The patient is to follow-up with primary care physician, vascular surgeon as outpatient  If you experience worsening of your admission symptoms, develop shortness of breath, life threatening emergency, suicidal or homicidal thoughts you must seek medical attention immediately by calling 911 or calling your MD immediately  if symptoms less severe.  You Must read complete instructions/literature along with all the possible adverse reactions/side effects for all the Medicines you take and that have been prescribed to you. Take any new Medicines after you have completely understood and accept all the possible adverse reactions/side effects.   Please note  You were cared for by a hospitalist during your hospital stay. If you have any questions about your discharge medications or the care you received while you were in the hospital after you are discharged, you can call the unit and asked to speak with the hospitalist on call if the hospitalist that took care  of you is not available. Once you are discharged, your primary care physician will handle any further medical issues. Please note that NO REFILLS for any discharge medications will be authorized once you are discharged, as it is imperative that you return to your primary care physician (or establish a relationship with a primary care physician if you do not have one) for your aftercare needs so that they can reassess your need for medications and monitor your lab values.    Today   CHIEF COMPLAINT:   Chief Complaint  Patient presents with  . Fever  . Wound Check    HISTORY OF PRESENT ILLNESS:  [name] [name]  is  a 83 y.o. female with a known history of chronic diastolic CHF, hypertension, diabetes, seizure disorder, previous stroke, who presents to the hospital with complaints of subjective fevers, left foot ulcer, drainage, pain. X-ray in emergency room revealed osteomyelitis of the heel. She was seen by vascular surgeon, Dr. Wyn Quaker, who discussed with podiatry service, that did not feel that patient's foot can be salvaged. The patient underwent percutaneous transluminal angioplasty of tibial peroneal trunk and distal popliteal artery with 4 mm diameter to 10 cm length 01/30/2017 by Dr. Wyn Quaker, she underwent below knee amputation of left leg on 10/01/2017. Postoperatively, she was noted to be anemic and hypotensive, requiring blood transfusion. After transfusion, patient's hemoglobin level improved to 8.9 on the day of discharge, blood pressure improved to 132/56. Patient was felt to be stable to be discharged to skilled nursing facility for rehabilitation. Discussion by problem:  * Left Foot diabetic ulcer/osteomyelitis-  Status post left bka Now, off IV antibiotics, the patient is to continue 3 more days of oral antibiotics/Augmentin Dressing changes per Dr. Wyn Quaker on discharge :Please remove bulky dressing and place Kerlex and ACE wrap at discharge. This can be changed every other day.   *Acute blood loss anemia patient , status post 1 unit of packed RBCs transfusion, hemoglobin level has improved to 8.9 on the day of discharge. It is recommended to follow patient's hemoglobin level as outpatient and consider iron supplementation if needed. This is due to combination of surgery as well as chronic anemia   * Hypotension -Blood pressure has improved after transfusion  * Diabetes type 2 with circulatory complications Diabetic regimen is being adjusted as per her sugars, blood glucose levels are ranging between 122 250, follow patient's blood glucose levels and advanced medications as needed  * CHF-chronic  diastolic CHF. -Clinically patient is not in congestive heart failure. Continue Lasix, carvedilol, lisinopril.  * Hyperlipidemia-continue atorvastatin.  * COPD-no acute exacerbation-continue Dulera, albuterol inhaler as needed.  * Diabetic neuropathy-continue gabapentin.  *Dementia, supportive therapy, she is incompetent to make decisions for medical care, per psychiatrist  VITAL SIGNS:  Blood pressure (!) 132/56, pulse 81, temperature 97.6 F (36.4 C), temperature source Axillary, resp. rate 18, height 5\' 3"  (1.6 m), weight 72.8 kg (160 lb 9.6 oz), SpO2 100 %.  I/O:   Intake/Output Summary (Last 24 hours) at 02/04/17 0959 Last data filed at 02/03/17 1650  Gross per 24 hour  Intake              448 ml  Output              250 ml  Net              198 ml    PHYSICAL EXAMINATION:  GENERAL:  83 y.o.-year-old patient lying in the bed with no acute distress.  EYES:  Pupils equal, round, reactive to light and accommodation. No scleral icterus. Extraocular muscles intact.  HEENT: Head atraumatic, normocephalic. Oropharynx and nasopharynx clear.  NECK:  Supple, no jugular venous distention. No thyroid enlargement, no tenderness.  LUNGS: Normal breath sounds bilaterally, no wheezing, rales,rhonchi or crepitation. No use of accessory muscles of respiration.  CARDIOVASCULAR: S1, S2 normal. No murmurs, rubs, or gallops.  ABDOMEN: Soft, non-tender, non-distended. Bowel sounds present. No organomegaly or mass.  EXTREMITIES: No pedal edema, cyanosis, or clubbing. Left lower extremity BKA, dressed NEUROLOGIC: Cranial nerves II through XII are intact. Muscle strength 5/5 in all extremities. Sensation intact. Gait not checked.  PSYCHIATRIC: The patient is alert and oriented x 3.  SKIN: No obvious rash, lesion, or ulcer.   DATA REVIEW:   CBC  Recent Labs Lab 02/04/17 0243  WBC 9.3  HGB 8.9*  HCT 26.6*  PLT 245    Chemistries   Recent Labs Lab 02/04/17 0243  NA 140  K 3.9  CL  103  CO2 31  GLUCOSE 249*  BUN 22*  CREATININE 1.13*  CALCIUM 8.1*    Cardiac Enzymes No results for input(s): TROPONINI in the last 168 hours.  Microbiology Results  Results for orders placed or performed during the hospital encounter of 01/25/17  Culture, blood (routine x 2)     Status: None   Collection Time: 01/25/17 12:26 PM  Result Value Ref Range Status   Specimen Description BLOOD RIGHT FA  Final   Special Requests BOTTLES DRAWN AEROBIC AND ANAEROBIC BCHV  Final   Culture NO GROWTH 5 DAYS  Final   Report Status 01/30/2017 FINAL  Final  Culture, blood (routine x 2)     Status: None   Collection Time: 01/25/17  1:20 PM  Result Value Ref Range Status   Specimen Description BLOOD R FA  Final   Special Requests BOTTLES DRAWN AEROBIC AND ANAEROBIC BCAV  Final   Culture NO GROWTH 5 DAYS  Final   Report Status 01/30/2017 FINAL  Final  Aerobic/Anaerobic Culture (surgical/deep wound)     Status: None   Collection Time: 01/25/17  7:00 PM  Result Value Ref Range Status   Specimen Description FOOT  Final   Special Requests NONE  Final   Gram Stain NO WBC SEEN RARE GRAM VARIABLE ROD   Final   Culture   Final    RARE NORMAL SKIN FLORA ABUNDANT BACTEROIDES FRAGILIS BETA LACTAMASE POSITIVE Performed at [facility], [address] [place], [place] [address]    Report Status 01/31/2017 FINAL  Final  Gastrointestinal Panel by PCR , Stool     Status: None   Collection Time: 01/29/17  9:48 AM  Result Value Ref Range Status   Campylobacter species NOT DETECTED NOT DETECTED Final   Plesimonas shigelloides NOT DETECTED NOT DETECTED Final   Salmonella species NOT DETECTED NOT DETECTED Final   Yersinia enterocolitica NOT DETECTED NOT DETECTED Final   Vibrio species NOT DETECTED NOT DETECTED Final   Vibrio cholerae NOT DETECTED NOT DETECTED Final   Enteroaggregative E coli (EAEC) NOT DETECTED NOT DETECTED Final   Enteropathogenic E coli (EPEC) NOT DETECTED NOT DETECTED Final    Enterotoxigenic E coli (ETEC) NOT DETECTED NOT DETECTED Final   Shiga like toxin producing E coli (STEC) NOT DETECTED NOT DETECTED Final   Shigella/Enteroinvasive E coli (EIEC) NOT DETECTED NOT DETECTED Final   Cryptosporidium NOT DETECTED NOT DETECTED Final   Cyclospora cayetanensis NOT DETECTED NOT DETECTED Final   Entamoeba histolytica  NOT DETECTED NOT DETECTED Final   Giardia lamblia NOT DETECTED NOT DETECTED Final   Adenovirus F40/41 NOT DETECTED NOT DETECTED Final   Astrovirus NOT DETECTED NOT DETECTED Final   Norovirus GI/GII NOT DETECTED NOT DETECTED Final   Rotavirus A NOT DETECTED NOT DETECTED Final   Sapovirus (I, II, IV, and V) NOT DETECTED NOT DETECTED Final    RADIOLOGY:  No results found.  EKG:   Orders placed or performed during the hospital encounter of 12/24/16  . ED EKG  . ED EKG  . EKG 12-Lead  . EKG 12-Lead  . EKG 12-Lead  . EKG 12-Lead  . EKG 12-Lead  . EKG 12-Lead  . EKG      Management plans discussed with the patient, family and they are in agreement.  CODE STATUS:     Code Status Orders        Start     Ordered   01/25/17 1729  Full code  Continuous     01/25/17 1729    Code Status History    Date Active Date Inactive Code Status Order ID Comments User Context   12/25/2016  2:35 AM 12/29/2016  8:10 PM Full Code [id number]  Tonye Royaltylexis Hugelmeyer, DO Inpatient   11/21/2016 12:02 AM 11/23/2016  9:38 PM Full Code [id number]  Lewie LoronMagadalene S Tukov, NP ED   09/15/2016  3:07 AM 09/15/2016 10:43 AM Full Code [id number]  Tonye RoyaltyAlexis Hugelmeyer, DO ED   08/21/2016 11:23 PM 08/25/2016  7:05 PM Full Code [id number]  Tonye RoyaltyAlexis Hugelmeyer, DO Inpatient      TOTAL TIME TAKING CARE OF THIS PATIENT: 40 minutes.    Katharina CaperVAICKUTE,Vinny Taranto M.D on 02/04/2017 at 9:59 AM  Between 7am to 6pm - Pager - [phone]  After 6pm go to [email or web address] - password [email or web address] [email or web address]  [facility]  Office  [phone]  CC: Primary care physician; Barbette ReichmannHANDE,VISHWANATH, MD

## 2017-02-06 ENCOUNTER — Encounter: Payer: Self-pay | Admitting: *Deleted

## 2017-02-06 DIAGNOSIS — I639 Cerebral infarction, unspecified: Secondary | ICD-10-CM | POA: Diagnosis not present

## 2017-02-06 DIAGNOSIS — E559 Vitamin D deficiency, unspecified: Secondary | ICD-10-CM | POA: Diagnosis not present

## 2017-02-06 DIAGNOSIS — J309 Allergic rhinitis, unspecified: Secondary | ICD-10-CM | POA: Diagnosis not present

## 2017-02-06 DIAGNOSIS — R278 Other lack of coordination: Secondary | ICD-10-CM | POA: Diagnosis not present

## 2017-02-06 DIAGNOSIS — I10 Essential (primary) hypertension: Secondary | ICD-10-CM | POA: Diagnosis not present

## 2017-02-06 DIAGNOSIS — I5041 Acute combined systolic (congestive) and diastolic (congestive) heart failure: Secondary | ICD-10-CM | POA: Diagnosis not present

## 2017-02-06 DIAGNOSIS — E876 Hypokalemia: Secondary | ICD-10-CM | POA: Diagnosis not present

## 2017-02-06 DIAGNOSIS — J449 Chronic obstructive pulmonary disease, unspecified: Secondary | ICD-10-CM | POA: Diagnosis not present

## 2017-02-06 DIAGNOSIS — M6281 Muscle weakness (generalized): Secondary | ICD-10-CM | POA: Diagnosis not present

## 2017-02-06 DIAGNOSIS — E1140 Type 2 diabetes mellitus with diabetic neuropathy, unspecified: Secondary | ICD-10-CM | POA: Diagnosis not present

## 2017-02-06 DIAGNOSIS — E785 Hyperlipidemia, unspecified: Secondary | ICD-10-CM | POA: Diagnosis not present

## 2017-02-06 DIAGNOSIS — M869 Osteomyelitis, unspecified: Secondary | ICD-10-CM | POA: Diagnosis not present

## 2017-02-06 DIAGNOSIS — R41841 Cognitive communication deficit: Secondary | ICD-10-CM | POA: Diagnosis not present

## 2017-02-07 DIAGNOSIS — E039 Hypothyroidism, unspecified: Secondary | ICD-10-CM | POA: Diagnosis not present

## 2017-02-07 DIAGNOSIS — E1140 Type 2 diabetes mellitus with diabetic neuropathy, unspecified: Secondary | ICD-10-CM | POA: Diagnosis not present

## 2017-02-07 DIAGNOSIS — E119 Type 2 diabetes mellitus without complications: Secondary | ICD-10-CM | POA: Diagnosis not present

## 2017-02-07 DIAGNOSIS — I739 Peripheral vascular disease, unspecified: Secondary | ICD-10-CM | POA: Diagnosis not present

## 2017-02-07 DIAGNOSIS — E7800 Pure hypercholesterolemia, unspecified: Secondary | ICD-10-CM | POA: Diagnosis not present

## 2017-02-07 DIAGNOSIS — J449 Chronic obstructive pulmonary disease, unspecified: Secondary | ICD-10-CM | POA: Diagnosis not present

## 2017-02-07 DIAGNOSIS — E790 Hyperuricemia without signs of inflammatory arthritis and tophaceous disease: Secondary | ICD-10-CM | POA: Diagnosis not present

## 2017-02-07 DIAGNOSIS — Z89511 Acquired absence of right leg below knee: Secondary | ICD-10-CM | POA: Diagnosis not present

## 2017-02-07 DIAGNOSIS — R41841 Cognitive communication deficit: Secondary | ICD-10-CM | POA: Diagnosis not present

## 2017-02-07 DIAGNOSIS — I10 Essential (primary) hypertension: Secondary | ICD-10-CM | POA: Diagnosis not present

## 2017-02-07 DIAGNOSIS — M86172 Other acute osteomyelitis, left ankle and foot: Secondary | ICD-10-CM | POA: Diagnosis not present

## 2017-02-07 DIAGNOSIS — M869 Osteomyelitis, unspecified: Secondary | ICD-10-CM | POA: Diagnosis not present

## 2017-02-07 DIAGNOSIS — R278 Other lack of coordination: Secondary | ICD-10-CM | POA: Diagnosis not present

## 2017-02-07 DIAGNOSIS — Z79899 Other long term (current) drug therapy: Secondary | ICD-10-CM | POA: Diagnosis not present

## 2017-02-07 DIAGNOSIS — M6281 Muscle weakness (generalized): Secondary | ICD-10-CM | POA: Diagnosis not present

## 2017-02-07 DIAGNOSIS — I639 Cerebral infarction, unspecified: Secondary | ICD-10-CM | POA: Diagnosis not present

## 2017-02-07 DIAGNOSIS — E559 Vitamin D deficiency, unspecified: Secondary | ICD-10-CM | POA: Diagnosis not present

## 2017-02-07 DIAGNOSIS — L97528 Non-pressure chronic ulcer of other part of left foot with other specified severity: Secondary | ICD-10-CM | POA: Diagnosis not present

## 2017-02-08 DIAGNOSIS — R278 Other lack of coordination: Secondary | ICD-10-CM | POA: Diagnosis not present

## 2017-02-08 DIAGNOSIS — M6281 Muscle weakness (generalized): Secondary | ICD-10-CM | POA: Diagnosis not present

## 2017-02-08 DIAGNOSIS — R41841 Cognitive communication deficit: Secondary | ICD-10-CM | POA: Diagnosis not present

## 2017-02-08 DIAGNOSIS — M869 Osteomyelitis, unspecified: Secondary | ICD-10-CM | POA: Diagnosis not present

## 2017-02-08 DIAGNOSIS — D649 Anemia, unspecified: Secondary | ICD-10-CM | POA: Diagnosis not present

## 2017-02-09 DIAGNOSIS — M869 Osteomyelitis, unspecified: Secondary | ICD-10-CM | POA: Diagnosis not present

## 2017-02-09 DIAGNOSIS — R278 Other lack of coordination: Secondary | ICD-10-CM | POA: Diagnosis not present

## 2017-02-09 DIAGNOSIS — R41841 Cognitive communication deficit: Secondary | ICD-10-CM | POA: Diagnosis not present

## 2017-02-09 DIAGNOSIS — M6281 Muscle weakness (generalized): Secondary | ICD-10-CM | POA: Diagnosis not present

## 2017-02-10 ENCOUNTER — Other Ambulatory Visit: Payer: Self-pay | Admitting: *Deleted

## 2017-02-10 ENCOUNTER — Ambulatory Visit: Payer: Self-pay | Admitting: Pharmacist

## 2017-02-10 DIAGNOSIS — R41841 Cognitive communication deficit: Secondary | ICD-10-CM | POA: Diagnosis not present

## 2017-02-10 DIAGNOSIS — M869 Osteomyelitis, unspecified: Secondary | ICD-10-CM | POA: Diagnosis not present

## 2017-02-10 DIAGNOSIS — M6281 Muscle weakness (generalized): Secondary | ICD-10-CM | POA: Diagnosis not present

## 2017-02-10 DIAGNOSIS — R278 Other lack of coordination: Secondary | ICD-10-CM | POA: Diagnosis not present

## 2017-02-10 NOTE — Patient Outreach (Signed)
[facility]) Care Management  02/10/2017  Frederich Chickrnell Deshmukh 1934-12-15 [id number]   Phone call to the discharge planner-Lakivia at the [facility] to check status of patient's progress in rehab.  Per WUJWJXBLakivia, patient is in their facility under long term care. She does not know the details of this yet, however a care planning meeting is scheduled for Monday, 02/13/17 at 3:00pm with patient's sister to confirm this long term plan.  Per JYNWGNFlakivia, she will assist the family with completing the Medicaid process.   Plan: This social worker will contact the discharge planner back following the care plan meeting on 02/13/17 to confirm long term plan.   Adriana ReamsChrystal Delle Andrzejewski, LCSW [facility] Care Management [phone]

## 2017-02-11 DIAGNOSIS — M6281 Muscle weakness (generalized): Secondary | ICD-10-CM | POA: Diagnosis not present

## 2017-02-11 DIAGNOSIS — R278 Other lack of coordination: Secondary | ICD-10-CM | POA: Diagnosis not present

## 2017-02-11 DIAGNOSIS — R41841 Cognitive communication deficit: Secondary | ICD-10-CM | POA: Diagnosis not present

## 2017-02-11 DIAGNOSIS — M869 Osteomyelitis, unspecified: Secondary | ICD-10-CM | POA: Diagnosis not present

## 2017-02-13 ENCOUNTER — Other Ambulatory Visit: Payer: Self-pay | Admitting: *Deleted

## 2017-02-13 DIAGNOSIS — M86172 Other acute osteomyelitis, left ankle and foot: Secondary | ICD-10-CM | POA: Diagnosis not present

## 2017-02-13 DIAGNOSIS — I639 Cerebral infarction, unspecified: Secondary | ICD-10-CM | POA: Diagnosis not present

## 2017-02-13 DIAGNOSIS — R278 Other lack of coordination: Secondary | ICD-10-CM | POA: Diagnosis not present

## 2017-02-13 DIAGNOSIS — R41841 Cognitive communication deficit: Secondary | ICD-10-CM | POA: Diagnosis not present

## 2017-02-13 DIAGNOSIS — E1140 Type 2 diabetes mellitus with diabetic neuropathy, unspecified: Secondary | ICD-10-CM | POA: Diagnosis not present

## 2017-02-13 DIAGNOSIS — Z89511 Acquired absence of right leg below knee: Secondary | ICD-10-CM | POA: Diagnosis not present

## 2017-02-13 DIAGNOSIS — E559 Vitamin D deficiency, unspecified: Secondary | ICD-10-CM | POA: Diagnosis not present

## 2017-02-13 DIAGNOSIS — I10 Essential (primary) hypertension: Secondary | ICD-10-CM | POA: Diagnosis not present

## 2017-02-13 DIAGNOSIS — L97528 Non-pressure chronic ulcer of other part of left foot with other specified severity: Secondary | ICD-10-CM | POA: Diagnosis not present

## 2017-02-13 DIAGNOSIS — J449 Chronic obstructive pulmonary disease, unspecified: Secondary | ICD-10-CM | POA: Diagnosis not present

## 2017-02-13 DIAGNOSIS — M869 Osteomyelitis, unspecified: Secondary | ICD-10-CM | POA: Diagnosis not present

## 2017-02-13 DIAGNOSIS — I739 Peripheral vascular disease, unspecified: Secondary | ICD-10-CM | POA: Diagnosis not present

## 2017-02-13 DIAGNOSIS — M6281 Muscle weakness (generalized): Secondary | ICD-10-CM | POA: Diagnosis not present

## 2017-02-13 NOTE — Telephone Encounter (Signed)
This encounter was created in error - please disregard.

## 2017-02-13 NOTE — Patient Outreach (Signed)
[facility]) Care Management  02/13/2017  [name] [name] Aug 01, 1934 [id number]   Phone call to WUJWJXBLakivia diischarge planner at the [facility] to follow up on telephonic care planning meeting with patient's family.  Per JYNWGNFLakivia, she was still on the conference call with patient's and and stated that she would call this social work back once the call was completed.     [name] ReamsChrystal Leianna Barga, LCSW [facility] Care Management [phone]

## 2017-02-13 NOTE — Addendum Note (Signed)
Addended by: Wenda OverlandLAND, Kaire Stary M on: 02/13/2017 03:15 PM   Modules accepted: Level of Service, SmartSet

## 2017-02-14 DIAGNOSIS — R41841 Cognitive communication deficit: Secondary | ICD-10-CM | POA: Diagnosis not present

## 2017-02-14 DIAGNOSIS — M6281 Muscle weakness (generalized): Secondary | ICD-10-CM | POA: Diagnosis not present

## 2017-02-14 DIAGNOSIS — R278 Other lack of coordination: Secondary | ICD-10-CM | POA: Diagnosis not present

## 2017-02-14 DIAGNOSIS — M869 Osteomyelitis, unspecified: Secondary | ICD-10-CM | POA: Diagnosis not present

## 2017-02-15 ENCOUNTER — Other Ambulatory Visit: Payer: Self-pay | Admitting: *Deleted

## 2017-02-15 DIAGNOSIS — M6281 Muscle weakness (generalized): Secondary | ICD-10-CM | POA: Diagnosis not present

## 2017-02-15 DIAGNOSIS — M869 Osteomyelitis, unspecified: Secondary | ICD-10-CM | POA: Diagnosis not present

## 2017-02-15 DIAGNOSIS — J9601 Acute respiratory failure with hypoxia: Secondary | ICD-10-CM | POA: Diagnosis not present

## 2017-02-15 DIAGNOSIS — R41841 Cognitive communication deficit: Secondary | ICD-10-CM | POA: Diagnosis not present

## 2017-02-15 DIAGNOSIS — I639 Cerebral infarction, unspecified: Secondary | ICD-10-CM | POA: Diagnosis not present

## 2017-02-15 DIAGNOSIS — I10 Essential (primary) hypertension: Secondary | ICD-10-CM | POA: Diagnosis not present

## 2017-02-15 DIAGNOSIS — E559 Vitamin D deficiency, unspecified: Secondary | ICD-10-CM | POA: Diagnosis not present

## 2017-02-15 DIAGNOSIS — I5020 Unspecified systolic (congestive) heart failure: Secondary | ICD-10-CM | POA: Diagnosis not present

## 2017-02-15 DIAGNOSIS — Z5189 Encounter for other specified aftercare: Secondary | ICD-10-CM | POA: Diagnosis not present

## 2017-02-15 DIAGNOSIS — Z89511 Acquired absence of right leg below knee: Secondary | ICD-10-CM | POA: Diagnosis not present

## 2017-02-15 DIAGNOSIS — I739 Peripheral vascular disease, unspecified: Secondary | ICD-10-CM | POA: Diagnosis not present

## 2017-02-15 DIAGNOSIS — E1140 Type 2 diabetes mellitus with diabetic neuropathy, unspecified: Secondary | ICD-10-CM | POA: Diagnosis not present

## 2017-02-15 DIAGNOSIS — M86172 Other acute osteomyelitis, left ankle and foot: Secondary | ICD-10-CM | POA: Diagnosis not present

## 2017-02-15 DIAGNOSIS — Z9181 History of falling: Secondary | ICD-10-CM | POA: Diagnosis not present

## 2017-02-15 DIAGNOSIS — R278 Other lack of coordination: Secondary | ICD-10-CM | POA: Diagnosis not present

## 2017-02-15 DIAGNOSIS — R42 Dizziness and giddiness: Secondary | ICD-10-CM | POA: Diagnosis not present

## 2017-02-15 DIAGNOSIS — L97528 Non-pressure chronic ulcer of other part of left foot with other specified severity: Secondary | ICD-10-CM | POA: Diagnosis not present

## 2017-02-15 DIAGNOSIS — J449 Chronic obstructive pulmonary disease, unspecified: Secondary | ICD-10-CM | POA: Diagnosis not present

## 2017-02-15 NOTE — Patient Outreach (Signed)
[facility]) Care Management  02/15/2017  [name] [name] Nov 26, 1934 [id number]   Phone call to discharge planner [name] [facility] to follow up on telephonic family meeting that tooke place last week.  Per [name], the final decision has not been made, however the family is leaning more towards patient returning home.  Patient's discharge date not determined yet, however she will call me once patient's discharge date is established.   Adriana ReamsChrystal Lendell Gallick, LCSW [facility] Care Management [phone]

## 2017-02-16 DIAGNOSIS — M869 Osteomyelitis, unspecified: Secondary | ICD-10-CM | POA: Diagnosis not present

## 2017-02-16 DIAGNOSIS — R41841 Cognitive communication deficit: Secondary | ICD-10-CM | POA: Diagnosis not present

## 2017-02-16 DIAGNOSIS — M6281 Muscle weakness (generalized): Secondary | ICD-10-CM | POA: Diagnosis not present

## 2017-02-16 DIAGNOSIS — R278 Other lack of coordination: Secondary | ICD-10-CM | POA: Diagnosis not present

## 2017-02-17 ENCOUNTER — Other Ambulatory Visit: Payer: Self-pay | Admitting: *Deleted

## 2017-02-17 DIAGNOSIS — L97528 Non-pressure chronic ulcer of other part of left foot with other specified severity: Secondary | ICD-10-CM | POA: Diagnosis not present

## 2017-02-17 DIAGNOSIS — I10 Essential (primary) hypertension: Secondary | ICD-10-CM | POA: Diagnosis not present

## 2017-02-17 DIAGNOSIS — I639 Cerebral infarction, unspecified: Secondary | ICD-10-CM | POA: Diagnosis not present

## 2017-02-17 DIAGNOSIS — E559 Vitamin D deficiency, unspecified: Secondary | ICD-10-CM | POA: Diagnosis not present

## 2017-02-17 DIAGNOSIS — M869 Osteomyelitis, unspecified: Secondary | ICD-10-CM | POA: Diagnosis not present

## 2017-02-17 DIAGNOSIS — E1140 Type 2 diabetes mellitus with diabetic neuropathy, unspecified: Secondary | ICD-10-CM | POA: Diagnosis not present

## 2017-02-17 DIAGNOSIS — R278 Other lack of coordination: Secondary | ICD-10-CM | POA: Diagnosis not present

## 2017-02-17 DIAGNOSIS — I739 Peripheral vascular disease, unspecified: Secondary | ICD-10-CM | POA: Diagnosis not present

## 2017-02-17 DIAGNOSIS — J449 Chronic obstructive pulmonary disease, unspecified: Secondary | ICD-10-CM | POA: Diagnosis not present

## 2017-02-17 DIAGNOSIS — M6281 Muscle weakness (generalized): Secondary | ICD-10-CM | POA: Diagnosis not present

## 2017-02-17 DIAGNOSIS — Z89511 Acquired absence of right leg below knee: Secondary | ICD-10-CM | POA: Diagnosis not present

## 2017-02-17 DIAGNOSIS — M86172 Other acute osteomyelitis, left ankle and foot: Secondary | ICD-10-CM | POA: Diagnosis not present

## 2017-02-17 DIAGNOSIS — R41841 Cognitive communication deficit: Secondary | ICD-10-CM | POA: Diagnosis not present

## 2017-02-17 NOTE — Patient Outreach (Signed)
[facility]) Care Management  02/17/2017  [name] [name] June 07, 1934 [id number]   Phone call on 02/16/17 from [name] [name], case manager at [facility] who was not aware of patient's hospitalization and [facility]iv[facility] stay [facility].  She was able to confirm that patient's  Case is still open with them and patient is eligible to receive 6 hours a week of personal care services [facility]. Patient to receive 2 hours a day of care 3 days per week.   Plan: This Child psychotherapistsocial worker will follow up with [name] [name] to confirm patient's discharge date.   [name] ReamsChrystal Herman Mell, LCSW [facility] Care Management [phone]

## 2017-02-20 ENCOUNTER — Other Ambulatory Visit: Payer: Self-pay | Admitting: *Deleted

## 2017-02-20 DIAGNOSIS — M869 Osteomyelitis, unspecified: Secondary | ICD-10-CM | POA: Diagnosis not present

## 2017-02-20 DIAGNOSIS — R41841 Cognitive communication deficit: Secondary | ICD-10-CM | POA: Diagnosis not present

## 2017-02-20 DIAGNOSIS — M6281 Muscle weakness (generalized): Secondary | ICD-10-CM | POA: Diagnosis not present

## 2017-02-20 DIAGNOSIS — R278 Other lack of coordination: Secondary | ICD-10-CM | POA: Diagnosis not present

## 2017-02-20 NOTE — Patient Outreach (Addendum)
[facility]) Care Management  02/20/2017  [name] Chickrnell Saldivar [birth year] [id number]   Incoming call from [name] [name] sister of [name] [name], to update me on [facility] [place].  [name] [name] discussed concern regarding progress with medicaid application not being completed yet, and states patient anticipated discharge from facility is 3/13 or 19 at the latest. Discussed that I would relay her concerns  with [name] [name] , [facility] LCSW that has been in communication with social worker at facility.  Plan Discussed update and concerns from [name] [name] with Toll BrothersChrystal Land, LCSW.  Late entry for 02/04/17  closure of nursing program entered on this date,[facility] and [facility] to [facility]. In bakset message has been sent to CMA.   [name] GaribaldiKimberly Geraldene Eisel, RN, [facility] Care Management [phone]- Mobile [phone]- Toll Free Main Office

## 2017-02-20 NOTE — Patient Outreach (Signed)
[facility]) Care Management  02/20/2017  [name] [name] [birth year] [id number]   Phone call to discharge planner [name]  [facility] who confirmed that patient's family has not yet completed patient's medicaid application,.    Phone call to [name] who states that patient's daughter has the application and has not yet compelted it. [name] [name] states that she will fill in what patient's daughter is not able to complete.  This Child psychotherapistsocial worker completed some of the initial information by phone, however [name] [name] will have to provide the Department of Social Services with the supporting documents.   [name] ReamsChrystal Licet Dunphy, LCSW [facility] Care Management [phone]

## 2017-02-21 ENCOUNTER — Other Ambulatory Visit: Payer: Self-pay | Admitting: *Deleted

## 2017-02-21 DIAGNOSIS — R278 Other lack of coordination: Secondary | ICD-10-CM | POA: Diagnosis not present

## 2017-02-21 DIAGNOSIS — R41841 Cognitive communication deficit: Secondary | ICD-10-CM | POA: Diagnosis not present

## 2017-02-21 DIAGNOSIS — M6281 Muscle weakness (generalized): Secondary | ICD-10-CM | POA: Diagnosis not present

## 2017-02-21 DIAGNOSIS — M869 Osteomyelitis, unspecified: Secondary | ICD-10-CM | POA: Diagnosis not present

## 2017-02-21 DIAGNOSIS — G894 Chronic pain syndrome: Secondary | ICD-10-CM | POA: Diagnosis not present

## 2017-02-21 NOTE — Patient Outreach (Signed)
[facility]) Care Management  02/21/2017  Frederich Chickrnell Forget 05-Nov-1934 [id number]   Phone call to Erie NoeVanessa who works in the [employer] at the [facility] to confirm patient's discharge date and Medicaid status.  Per Erie NoeVanessa, patient will have to discharge on 03/05/17 if she does not complete the Medicaid application.  Per Erie NoeVanessa, patient is wanting to discharge home and is not interested in Long Term Care. She along with social work will speak to her again today about the possibility of long term care and will inform this Child psychotherapistsocial worker of the outcome.  Per Erie NoeVanessa, financial concerns may be driving the reluctance to apply for Medicaid.  Plan:  This Child psychotherapistsocial worker will assist patient with Medicaid application post [facility].            If patient agrees to long term care, this social worker will resume assistance with completing the application for long term Medicaid            This social worker will continue to follow patient while at the [facility] to assist with discharge planning.    Adriana ReamsChrystal Land, LCSW [facility] Care Management [phone]

## 2017-02-22 DIAGNOSIS — R278 Other lack of coordination: Secondary | ICD-10-CM | POA: Diagnosis not present

## 2017-02-22 DIAGNOSIS — M869 Osteomyelitis, unspecified: Secondary | ICD-10-CM | POA: Diagnosis not present

## 2017-02-22 DIAGNOSIS — M6281 Muscle weakness (generalized): Secondary | ICD-10-CM | POA: Diagnosis not present

## 2017-02-22 DIAGNOSIS — R41841 Cognitive communication deficit: Secondary | ICD-10-CM | POA: Diagnosis not present

## 2017-02-23 ENCOUNTER — Other Ambulatory Visit: Payer: Self-pay | Admitting: *Deleted

## 2017-02-23 DIAGNOSIS — M869 Osteomyelitis, unspecified: Secondary | ICD-10-CM | POA: Diagnosis not present

## 2017-02-23 DIAGNOSIS — R41841 Cognitive communication deficit: Secondary | ICD-10-CM | POA: Diagnosis not present

## 2017-02-23 DIAGNOSIS — M6281 Muscle weakness (generalized): Secondary | ICD-10-CM | POA: Diagnosis not present

## 2017-02-23 DIAGNOSIS — R278 Other lack of coordination: Secondary | ICD-10-CM | POA: Diagnosis not present

## 2017-02-23 NOTE — Patient Outreach (Signed)
[facility]) Care Management  02/23/2017  Frederich Chickrnell Stary 10-26-1934 [id number]   Phone call to the discharge planner at the [facility] to discuss patient's long term care plan.  Per Charlynne PanderLakievia, patient's sister visited yesterday and discussed difficulty in that the family is having a hard time deciding if patient should stay long term or return home.  Per Sherlyn HayLakeivia, no one person wants to take the lead on this issue.  Scheduling a care planning meeting has been difficult, as it keeps getting put off.  Patient's competency in question, a mental health consult has been ordered. Per Sherlyn HayLakeivia, the business office has completed a medicaid application, however needs additional information before a final decision is made.  Per discharge planner, patient's DME needs have not been identified yet, patient continues to work with PT and OT.   Plan: Care planning meeting to be scheduled

## 2017-02-24 DIAGNOSIS — M6281 Muscle weakness (generalized): Secondary | ICD-10-CM | POA: Diagnosis not present

## 2017-02-24 DIAGNOSIS — M869 Osteomyelitis, unspecified: Secondary | ICD-10-CM | POA: Diagnosis not present

## 2017-02-24 DIAGNOSIS — R278 Other lack of coordination: Secondary | ICD-10-CM | POA: Diagnosis not present

## 2017-02-24 DIAGNOSIS — R41841 Cognitive communication deficit: Secondary | ICD-10-CM | POA: Diagnosis not present

## 2017-02-25 DIAGNOSIS — S81802A Unspecified open wound, left lower leg, initial encounter: Secondary | ICD-10-CM | POA: Diagnosis not present

## 2017-02-26 DIAGNOSIS — M869 Osteomyelitis, unspecified: Secondary | ICD-10-CM | POA: Diagnosis not present

## 2017-02-26 DIAGNOSIS — R278 Other lack of coordination: Secondary | ICD-10-CM | POA: Diagnosis not present

## 2017-02-26 DIAGNOSIS — R41841 Cognitive communication deficit: Secondary | ICD-10-CM | POA: Diagnosis not present

## 2017-02-26 DIAGNOSIS — M6281 Muscle weakness (generalized): Secondary | ICD-10-CM | POA: Diagnosis not present

## 2017-02-27 DIAGNOSIS — R41841 Cognitive communication deficit: Secondary | ICD-10-CM | POA: Diagnosis not present

## 2017-02-27 DIAGNOSIS — R278 Other lack of coordination: Secondary | ICD-10-CM | POA: Diagnosis not present

## 2017-02-27 DIAGNOSIS — M6281 Muscle weakness (generalized): Secondary | ICD-10-CM | POA: Diagnosis not present

## 2017-02-27 DIAGNOSIS — M869 Osteomyelitis, unspecified: Secondary | ICD-10-CM | POA: Diagnosis not present

## 2017-02-28 ENCOUNTER — Other Ambulatory Visit: Payer: Self-pay | Admitting: *Deleted

## 2017-02-28 DIAGNOSIS — L97528 Non-pressure chronic ulcer of other part of left foot with other specified severity: Secondary | ICD-10-CM | POA: Diagnosis not present

## 2017-02-28 DIAGNOSIS — I10 Essential (primary) hypertension: Secondary | ICD-10-CM | POA: Diagnosis not present

## 2017-02-28 DIAGNOSIS — M6281 Muscle weakness (generalized): Secondary | ICD-10-CM | POA: Diagnosis not present

## 2017-02-28 DIAGNOSIS — R41841 Cognitive communication deficit: Secondary | ICD-10-CM | POA: Diagnosis not present

## 2017-02-28 DIAGNOSIS — M869 Osteomyelitis, unspecified: Secondary | ICD-10-CM | POA: Diagnosis not present

## 2017-02-28 DIAGNOSIS — R278 Other lack of coordination: Secondary | ICD-10-CM | POA: Diagnosis not present

## 2017-02-28 DIAGNOSIS — E559 Vitamin D deficiency, unspecified: Secondary | ICD-10-CM | POA: Diagnosis not present

## 2017-02-28 DIAGNOSIS — L299 Pruritus, unspecified: Secondary | ICD-10-CM | POA: Diagnosis not present

## 2017-02-28 DIAGNOSIS — L89510 Pressure ulcer of right ankle, unstageable: Secondary | ICD-10-CM | POA: Diagnosis not present

## 2017-02-28 DIAGNOSIS — I739 Peripheral vascular disease, unspecified: Secondary | ICD-10-CM | POA: Diagnosis not present

## 2017-02-28 DIAGNOSIS — I639 Cerebral infarction, unspecified: Secondary | ICD-10-CM | POA: Diagnosis not present

## 2017-02-28 DIAGNOSIS — Z89511 Acquired absence of right leg below knee: Secondary | ICD-10-CM | POA: Diagnosis not present

## 2017-02-28 DIAGNOSIS — L98419 Non-pressure chronic ulcer of buttock with unspecified severity: Secondary | ICD-10-CM | POA: Diagnosis not present

## 2017-02-28 DIAGNOSIS — E1140 Type 2 diabetes mellitus with diabetic neuropathy, unspecified: Secondary | ICD-10-CM | POA: Diagnosis not present

## 2017-02-28 DIAGNOSIS — J449 Chronic obstructive pulmonary disease, unspecified: Secondary | ICD-10-CM | POA: Diagnosis not present

## 2017-02-28 NOTE — Patient Outreach (Signed)
[facility]) Care Management  02/28/2017  [name] [name] 21-Oct-1934 [id number]   Phone call from patient's niece stating that she has not been able to confirm if patient is coming home or not.  Per [name], she has placed a call to [name] in the business office at the [facility] to confirm but she is out of the office until Thursday.  She will call [name] back on Thursday and will call this social worker back to confirm patient's long term plan.     [name] ReamsChrystal Esmeralda Malay, LCSW [facility] Care Management [phone]

## 2017-03-01 DIAGNOSIS — R41841 Cognitive communication deficit: Secondary | ICD-10-CM | POA: Diagnosis not present

## 2017-03-01 DIAGNOSIS — M869 Osteomyelitis, unspecified: Secondary | ICD-10-CM | POA: Diagnosis not present

## 2017-03-01 DIAGNOSIS — R278 Other lack of coordination: Secondary | ICD-10-CM | POA: Diagnosis not present

## 2017-03-01 DIAGNOSIS — M6281 Muscle weakness (generalized): Secondary | ICD-10-CM | POA: Diagnosis not present

## 2017-03-02 ENCOUNTER — Other Ambulatory Visit: Payer: Self-pay | Admitting: *Deleted

## 2017-03-02 DIAGNOSIS — R41841 Cognitive communication deficit: Secondary | ICD-10-CM | POA: Diagnosis not present

## 2017-03-02 DIAGNOSIS — M6281 Muscle weakness (generalized): Secondary | ICD-10-CM | POA: Diagnosis not present

## 2017-03-02 DIAGNOSIS — R278 Other lack of coordination: Secondary | ICD-10-CM | POA: Diagnosis not present

## 2017-03-02 DIAGNOSIS — M869 Osteomyelitis, unspecified: Secondary | ICD-10-CM | POA: Diagnosis not present

## 2017-03-02 NOTE — Patient Outreach (Addendum)
[facility]) Care Management  [place]  03/02/2017   Aleia Larocca 1934/07/07 [id number]  Subjective:  Met with patient at [facility]  Patient discussed that she has a problem with itching .     Objective  Patient resting in bed, that has air overlay mattress.    Assessment:  CoVisit with [name] , LCSW  At  [facility] in [place] discussed patient discharge anticipated discharge plan  with social workers, physical therapist, regarding  patient current progress, discharge needs whether patient will be returning home or benefit  from continued long term care.  Patient currently requires total care, she is able to fed herself with setup assistance, patient has 24 hour assist care needs.   [name] oriented to name, day of week  Per report patient has stage 2 wound to sacrum and a wound to the right foot/heel area ,and orders to remove staples from amputation surgery site today.   If plan for patient to return home she will benefit from receiving prescriptions for medical equipment prior to discharge in order to have setup in home prior to patient arrival home. Anticipated discharge date is 3/19, end of 30 day letter of guarantee if patient has a safe discharge plan.  Full list of medical equipment needs have not yet been determined but anticipate need for air overlay for hospital bed at home , hoyer lift , slide board, wheelchair with leg rest.    Plan:   Discussed with social worker at [facility] that if patient return home she will need orders for equipment faxed to agency of patient/family choice sooner to have in place at discharge .  Plan early home visit if patient discharged to home. Will continue collaboration with social worker regarding discharge disposition.   Joylene Draft, RN, [place] Management [phone]- Mobile [phone]- Toll Free Main Office

## 2017-03-02 NOTE — Patient Outreach (Signed)
[facility]) Care Management  [facility] Social Work  03/02/2017  [name] [name] July 29, 1934 [id number]  Subjective:  Co-visit with RNCM Ander Purpura to meet with patient and the treatment staff at the [facility] This Child psychotherapist spoke with Allean Found in the Business office who confirmed that she has completed a Medicaid application for patient to remain at the [facility] for long term care.  Authorization pending. This Child psychotherapist as well as [facility] RNCM also spoke with the discharge planners on the unit [place] who both have concerns about patient discharging home with limited hands on support.  PT Lorri Frederick confirms that patient requires total (24 hour) care.  Discharge needs discussed if patient does return home  On 03/06/17.  Patient anticipated DME needs,  wheelchair with leg rest, hoyer lift, 3 in 1 commode, slide board, air mattress. Patient would also discharge with [facility] including wound care due to wounds on sacrum and right foot/heel area.  Per patient's treatment team, patient's discharge will be cancelled if it is determined that she would not be safe to discharge.  Objective:   Encounter Medications:  Outpatient Encounter Prescriptions as of 03/02/2017  Medication Sig  . acetaminophen (TYLENOL) 325 MG tablet Take 2 tablets (650 mg total) by mouth every 6 (six) hours as needed for mild pain (temp > 101.5).  Marland Kitchen albuterol (PROVENTIL HFA;VENTOLIN HFA) 108 (90 Base) MCG/ACT inhaler Inhale 2 puffs into the lungs every 6 (six) hours as needed for wheezing or shortness of breath.  Marland Kitchen amoxicillin-clavulanate (AUGMENTIN) 875-125 MG tablet Take 1 tablet by mouth every 12 (twelve) hours.  Marland Kitchen aspirin 81 MG tablet Chew 81 mg by mouth daily.  Marland Kitchen atorvastatin (LIPITOR) 10 MG tablet Take 1 tablet by mouth at bedtime.   . carvedilol (COREG) 3.125 MG tablet Take 1 tablet (3.125 mg total) by mouth 2 (two) times daily with a meal.  . clopidogrel (PLAVIX) 75 MG tablet Take 1  tablet (75 mg total) by mouth daily.  . collagenase (SANTYL) ointment Apply topically daily.  . diphenhydrAMINE (BENADRYL) 25 mg capsule Take 25 mg by mouth every 6 (six) hours as needed for itching.  . fluticasone-salmeterol (ADVAIR HFA) 115-21 MCG/ACT inhaler Inhale 2 puffs into the lungs 2 (two) times daily.  . furosemide (LASIX) 20 MG tablet Take 1 tablet (20 mg total) by mouth 2 (two) times daily.  Marland Kitchen gabapentin (NEURONTIN) 100 MG capsule Take 2 capsules by mouth 2 (two) times daily. Take 2 capsules (200mg ) twice a day  . insulin aspart (NOVOLOG) 100 UNIT/ML injection Inject 0-15 Units into the skin 3 (three) times daily with meals.  . insulin glargine (LANTUS) 100 UNIT/ML injection Inject 0.18 mLs (18 Units total) into the skin daily.  Marland Kitchen lisinopril (PRINIVIL,ZESTRIL) 2.5 MG tablet Take 1 tablet (2.5 mg total) by mouth daily.  Marland Kitchen loratadine (CLARITIN) 10 MG tablet Take 10 mg by mouth daily.  Marland Kitchen oxyCODONE-acetaminophen (PERCOCET/ROXICET) 5-325 MG tablet Take 1 tablet by mouth every 6 (six) hours as needed for moderate pain or severe pain.  . potassium chloride SA (K-DUR,KLOR-CON) 20 MEQ tablet Take 20 mEq by mouth daily.  . Vitamin D, Ergocalciferol, (DRISDOL) 50000 units CAPS capsule Take 1 capsule by mouth once a week.   No facility-administered encounter medications on file as of 03/02/2017.     Functional Status:  In your present state of health, do you have any difficulty performing the following activities: 01/25/2017 01/25/2017  Hearing? - N  Vision? - N  Difficulty  concentrating or making decisions? - N  Walking or climbing stairs? - Y  Dressing or bathing? - Y  Doing errands, shopping? Y -  Quarry managerreparing Food and eating ? - -  Using the Toilet? - -  In the past six months, have you accidently leaked urine? - -  Do you have problems with loss of bowel control? - -  Managing your Medications? - -  Managing your Finances? - -  Housekeeping or managing your Housekeeping? - -  Some recent  data might be hidden    Fall/Depression Screening:  PHQ 2/9 Scores 01/17/2017 11/30/2016  PHQ - 2 Score 1 0    Assessment:  Patient oriented to name and day of the week.  Appeared disoriented at times, would jump from topic to topic. Complained of itching.  According to PT patient requires total care, long term care recommended by treatment team.  Patient's anticipated discharge is scheduled for 03/06/17, however if they feel that it will be a unsafe discharge, patient would be able to stay.  This social worker's contact information provided as well as RNCM for follow up related to patient's discharge plan.  Plan:  [facility] staff provided with this social worker's contact information if patient discharges.  Recommended that patient's DME and HH needs be in place before discharge home.  This Child psychotherapistsocial worker will continue to collaborate with [facility] and [facility] regarding discharge disposition.    Adriana ReamsChrystal Jacory Kamel, LCSW [facility] Care Management [phone]

## 2017-03-03 DIAGNOSIS — M869 Osteomyelitis, unspecified: Secondary | ICD-10-CM | POA: Diagnosis not present

## 2017-03-03 DIAGNOSIS — R278 Other lack of coordination: Secondary | ICD-10-CM | POA: Diagnosis not present

## 2017-03-03 DIAGNOSIS — M6281 Muscle weakness (generalized): Secondary | ICD-10-CM | POA: Diagnosis not present

## 2017-03-03 DIAGNOSIS — R41841 Cognitive communication deficit: Secondary | ICD-10-CM | POA: Diagnosis not present

## 2017-03-05 DIAGNOSIS — M6281 Muscle weakness (generalized): Secondary | ICD-10-CM | POA: Diagnosis not present

## 2017-03-05 DIAGNOSIS — R278 Other lack of coordination: Secondary | ICD-10-CM | POA: Diagnosis not present

## 2017-03-05 DIAGNOSIS — M869 Osteomyelitis, unspecified: Secondary | ICD-10-CM | POA: Diagnosis not present

## 2017-03-05 DIAGNOSIS — R41841 Cognitive communication deficit: Secondary | ICD-10-CM | POA: Diagnosis not present

## 2017-03-06 ENCOUNTER — Other Ambulatory Visit: Payer: Self-pay | Admitting: *Deleted

## 2017-03-06 DIAGNOSIS — R41841 Cognitive communication deficit: Secondary | ICD-10-CM | POA: Diagnosis not present

## 2017-03-06 DIAGNOSIS — M6281 Muscle weakness (generalized): Secondary | ICD-10-CM | POA: Diagnosis not present

## 2017-03-06 DIAGNOSIS — M869 Osteomyelitis, unspecified: Secondary | ICD-10-CM | POA: Diagnosis not present

## 2017-03-06 DIAGNOSIS — R278 Other lack of coordination: Secondary | ICD-10-CM | POA: Diagnosis not present

## 2017-03-06 NOTE — Patient Outreach (Signed)
[facility]) Care Management  03/06/2017  [name] [name] 1934-11-05 [id number]   [facility] to check patient's placement status.  Per the discharge planner, [name] [name], patient has not discharged.  Per [name] [name], she has not been given an updated  regarding patient's long term plan.   Plan: This social worker will the discharge planner for follow up on patient's long term plan on 03/07/17.    Adriana ReamsChrystal Nila Winker, LCSW [facility] Care Management [phone]

## 2017-03-06 NOTE — Patient Outreach (Signed)
[facility]) Care Management  03/06/2017  Frederich Chickrnell Markos 1934/01/15 [id number]  Incoming call for Evalina Fieldctive [name] [name], sister of patient , she is calling with concern regarding [name] [name] discharge plans.  Drema HalonOctive discussed she [facility] in [place] over the weekend and she discussed concern regarding infection at amputation site and she has been started on antibiotics.  Drema HalonOctive inquiring if we had any new information regarding patient discharge plan, states she has called and left a message with Vicky at business office regarding medicaid application . Drema HalonOctive discussed she has made inquiry at local rehab regarding cost, states she does not have access to patient financial information to help with medicaid application .  I have encouraged patient to discuss with [name] that is handling patient finances, and to discuss as family best option for patient discharge plans based on her current needs. She states they cannot afford to pay for in home services.   Plan Will discuss this conversation with [name] Land, LCSW.and continue collaboration regarding discharge needs. Encourage family discussion of needs and provide support and update with information.   Egbert GaribaldiKimberly Jelesa Mangini, RN, [facility] Care Management [phone] Mobile [phone] Toll Free Main Office

## 2017-03-07 DIAGNOSIS — M869 Osteomyelitis, unspecified: Secondary | ICD-10-CM | POA: Diagnosis not present

## 2017-03-07 DIAGNOSIS — R41841 Cognitive communication deficit: Secondary | ICD-10-CM | POA: Diagnosis not present

## 2017-03-07 DIAGNOSIS — R278 Other lack of coordination: Secondary | ICD-10-CM | POA: Diagnosis not present

## 2017-03-07 DIAGNOSIS — M6281 Muscle weakness (generalized): Secondary | ICD-10-CM | POA: Diagnosis not present

## 2017-03-08 ENCOUNTER — Other Ambulatory Visit: Payer: Self-pay | Admitting: *Deleted

## 2017-03-08 DIAGNOSIS — R41841 Cognitive communication deficit: Secondary | ICD-10-CM | POA: Diagnosis not present

## 2017-03-08 DIAGNOSIS — M869 Osteomyelitis, unspecified: Secondary | ICD-10-CM | POA: Diagnosis not present

## 2017-03-08 DIAGNOSIS — R278 Other lack of coordination: Secondary | ICD-10-CM | POA: Diagnosis not present

## 2017-03-08 DIAGNOSIS — M6281 Muscle weakness (generalized): Secondary | ICD-10-CM | POA: Diagnosis not present

## 2017-03-08 NOTE — Patient Outreach (Addendum)
[facility]) Care Management  03/08/2017  [name] [name] Aug 28, 1934 [id number]   Phone call to the discharge planner on 03/07/17 [name] who confirmed that patient is pursuing long term care for [facility]. Patient being treated for an infection from her surgical site.  Per [name], patient has not followed up with the surgeon that completed her amputation, however a follow up appointment will be made.  Phone call made to care manager at [name] and informed her of patient's long term care plan.  [facility]'s [facility] as patient has now [facility]ed [facility] [place].  Patient's provider's office to be notified as well as RNCM [name] [name].   [name] ReamsChrystal Miller [place], LCSW [facility] Care Management [phone]

## 2017-03-09 DIAGNOSIS — Z79899 Other long term (current) drug therapy: Secondary | ICD-10-CM | POA: Diagnosis not present

## 2017-03-09 DIAGNOSIS — M869 Osteomyelitis, unspecified: Secondary | ICD-10-CM | POA: Diagnosis not present

## 2017-03-09 DIAGNOSIS — M6281 Muscle weakness (generalized): Secondary | ICD-10-CM | POA: Diagnosis not present

## 2017-03-09 DIAGNOSIS — R41841 Cognitive communication deficit: Secondary | ICD-10-CM | POA: Diagnosis not present

## 2017-03-09 DIAGNOSIS — R278 Other lack of coordination: Secondary | ICD-10-CM | POA: Diagnosis not present

## 2017-03-10 DIAGNOSIS — R41841 Cognitive communication deficit: Secondary | ICD-10-CM | POA: Diagnosis not present

## 2017-03-10 DIAGNOSIS — M869 Osteomyelitis, unspecified: Secondary | ICD-10-CM | POA: Diagnosis not present

## 2017-03-10 DIAGNOSIS — M6281 Muscle weakness (generalized): Secondary | ICD-10-CM | POA: Diagnosis not present

## 2017-03-10 DIAGNOSIS — R278 Other lack of coordination: Secondary | ICD-10-CM | POA: Diagnosis not present

## 2017-03-13 DIAGNOSIS — R41841 Cognitive communication deficit: Secondary | ICD-10-CM | POA: Diagnosis not present

## 2017-03-13 DIAGNOSIS — M6281 Muscle weakness (generalized): Secondary | ICD-10-CM | POA: Diagnosis not present

## 2017-03-13 DIAGNOSIS — M869 Osteomyelitis, unspecified: Secondary | ICD-10-CM | POA: Diagnosis not present

## 2017-03-13 DIAGNOSIS — R278 Other lack of coordination: Secondary | ICD-10-CM | POA: Diagnosis not present

## 2017-03-14 ENCOUNTER — Ambulatory Visit (INDEPENDENT_AMBULATORY_CARE_PROVIDER_SITE_OTHER): Payer: Medicare HMO | Admitting: Vascular Surgery

## 2017-03-14 ENCOUNTER — Encounter (INDEPENDENT_AMBULATORY_CARE_PROVIDER_SITE_OTHER): Payer: Self-pay | Admitting: Vascular Surgery

## 2017-03-14 VITALS — BP 122/63 | HR 83 | Resp 15 | Ht 62.0 in | Wt 160.0 lb

## 2017-03-14 DIAGNOSIS — M869 Osteomyelitis, unspecified: Secondary | ICD-10-CM | POA: Diagnosis not present

## 2017-03-14 DIAGNOSIS — R41841 Cognitive communication deficit: Secondary | ICD-10-CM | POA: Diagnosis not present

## 2017-03-14 DIAGNOSIS — Z89512 Acquired absence of left leg below knee: Secondary | ICD-10-CM

## 2017-03-14 DIAGNOSIS — R278 Other lack of coordination: Secondary | ICD-10-CM | POA: Diagnosis not present

## 2017-03-14 DIAGNOSIS — Z794 Long term (current) use of insulin: Secondary | ICD-10-CM

## 2017-03-14 DIAGNOSIS — F0280 Dementia in other diseases classified elsewhere without behavioral disturbance: Secondary | ICD-10-CM

## 2017-03-14 DIAGNOSIS — G309 Alzheimer's disease, unspecified: Secondary | ICD-10-CM

## 2017-03-14 DIAGNOSIS — M6281 Muscle weakness (generalized): Secondary | ICD-10-CM | POA: Diagnosis not present

## 2017-03-14 DIAGNOSIS — E118 Type 2 diabetes mellitus with unspecified complications: Secondary | ICD-10-CM

## 2017-03-15 DIAGNOSIS — R278 Other lack of coordination: Secondary | ICD-10-CM | POA: Diagnosis not present

## 2017-03-15 DIAGNOSIS — M6281 Muscle weakness (generalized): Secondary | ICD-10-CM | POA: Diagnosis not present

## 2017-03-15 DIAGNOSIS — M869 Osteomyelitis, unspecified: Secondary | ICD-10-CM | POA: Diagnosis not present

## 2017-03-15 DIAGNOSIS — R41841 Cognitive communication deficit: Secondary | ICD-10-CM | POA: Diagnosis not present

## 2017-03-16 ENCOUNTER — Encounter: Payer: Self-pay | Admitting: *Deleted

## 2017-03-16 DIAGNOSIS — R278 Other lack of coordination: Secondary | ICD-10-CM | POA: Diagnosis not present

## 2017-03-16 DIAGNOSIS — R41841 Cognitive communication deficit: Secondary | ICD-10-CM | POA: Diagnosis not present

## 2017-03-16 DIAGNOSIS — M869 Osteomyelitis, unspecified: Secondary | ICD-10-CM | POA: Diagnosis not present

## 2017-03-16 DIAGNOSIS — M6281 Muscle weakness (generalized): Secondary | ICD-10-CM | POA: Diagnosis not present

## 2017-03-16 NOTE — Progress Notes (Addendum)
Patient ID: [name] [name], female   DOB: 1934/07/11, 83 y.o.   MRN: [id number]  Chief Complaint  Patient presents with  . Routine Post Op    Post op amputation    HPI [name] [name] is a 83 y.o. female.  Patient returns 6 weeks after Left BKA for non-healing ulceration and infection.  She is doing well.  Her stump is basically healed and her staples are out.  No fever or chills.  No signs of infection.   Past Medical History:  Diagnosis Date  . Diabetes mellitus without complication (HCC)   . Hypertension   . Stroke [facility](HCC)     Past Surgical History:  Procedure Laterality Date  . AMPUTATION Left 02/01/2017   Procedure: AMPUTATION BELOW KNEE;  Surgeon: Annice NeedyJason S Dew, MD;  Location: [facility] ORS;  Service: Vascular;  Laterality: Left;  . LOWER EXTREMITY ANGIOGRAPHY Left 01/30/2017   Procedure: Lower Extremity Angiography;  Surgeon: Annice NeedyJason S Dew, MD;  Location: [facility] INVASIVE CV LAB;  Service: Cardiovascular;  Laterality: Left;  . LOWER EXTREMITY INTERVENTION  01/30/2017   Procedure: Lower Extremity Intervention;  Surgeon: Annice NeedyJason S Dew, MD;  Location: [facility] INVASIVE CV LAB;  Service: Cardiovascular;;  . PERIPHERAL VASCULAR CATHETERIZATION N/A 11/14/2016   Procedure: Lower Extremity Angiography;  Surgeon: Annice NeedyJason S Dew, MD;  Location: [facility] INVASIVE CV LAB;  Service: Cardiovascular;  Laterality: N/A;  . PERIPHERAL VASCULAR CATHETERIZATION Right 11/21/2016   Procedure: Lower Extremity Angiography;  Surgeon: Annice NeedyJason S Dew, MD;  Location: [facility] INVASIVE CV LAB;  Service: Cardiovascular;  Laterality: Right;  . PERIPHERAL VASCULAR CATHETERIZATION N/A 11/21/2016   Procedure: Abdominal Aortogram w/Lower Extremity;  Surgeon: Annice NeedyJason S Dew, MD;  Location: [facility] INVASIVE CV LAB;  Service: Cardiovascular;  Laterality: N/A;  . PERIPHERAL VASCULAR CATHETERIZATION  11/21/2016   Procedure: Lower Extremity Intervention;  Surgeon: Annice NeedyJason S Dew, MD;  Location: [facility] INVASIVE CV LAB;  Service: Cardiovascular;;      No  Known Allergies  Current Outpatient Prescriptions  Medication Sig Dispense Refill  . acetaminophen (TYLENOL) 325 MG tablet Take 2 tablets (650 mg total) by mouth every 6 (six) hours as needed for mild pain (temp > 101.5).    Marland Kitchen. albuterol (PROVENTIL HFA;VENTOLIN HFA) 108 (90 Base) MCG/ACT inhaler Inhale 2 puffs into the lungs every 6 (six) hours as needed for wheezing or shortness of breath. 1 Inhaler 1  . aspirin 81 MG tablet Chew 81 mg by mouth daily.    Marland Kitchen. atorvastatin (LIPITOR) 10 MG tablet Take 1 tablet by mouth at bedtime.     . carvedilol (COREG) 3.125 MG tablet Take 1 tablet (3.125 mg total) by mouth 2 (two) times daily with a meal. 30 tablet 0  . clopidogrel (PLAVIX) 75 MG tablet Take 1 tablet (75 mg total) by mouth daily. 30 tablet 1  . collagenase (SANTYL) ointment Apply topically daily. 15 g 0  . diphenhydrAMINE (BENADRYL) 25 mg capsule Take 25 mg by mouth every 6 (six) hours as needed for itching.    . ferrous sulfate 325 (65 FE) MG tablet Take 325 mg by mouth daily with breakfast.    . fluticasone furoate-vilanterol (BREO ELLIPTA) 100-25 MCG/INH AEPB Inhale 1 puff into the lungs daily.    . fluticasone-salmeterol (ADVAIR HFA) 115-21 MCG/ACT inhaler Inhale 2 puffs into the lungs 2 (two) times daily. 1 Inhaler 0  . furosemide (LASIX) 20 MG tablet Take 1 tablet (20 mg total) by mouth 2 (two) times daily. 60 tablet 0  . gabapentin (NEURONTIN) 100  MG capsule Take 2 capsules by mouth 2 (two) times daily. Take 2 capsules (200mg ) twice a day    . hydrOXYzine (ATARAX/VISTARIL) 10 MG tablet Take 10 mg by mouth 3 (three) times daily as needed.    . insulin aspart (NOVOLOG) 100 UNIT/ML injection Inject 0-15 Units into the skin 3 (three) times daily with meals. 100 mL 0  . insulin glargine (LANTUS) 100 UNIT/ML injection Inject 0.18 mLs (18 Units total) into the skin daily. 10 mL 11  . lisinopril (PRINIVIL,ZESTRIL) 2.5 MG tablet Take 1 tablet (2.5 mg total) by mouth daily. 30 tablet 0  . loratadine  (CLARITIN) 10 MG tablet Take 10 mg by mouth daily.    Marland Kitchen oxyCODONE-acetaminophen (PERCOCET/ROXICET) 5-325 MG tablet Take 1 tablet by mouth every 6 (six) hours as needed for moderate pain or severe pain. 30 tablet 0  . potassium chloride SA (K-DUR,KLOR-CON) 20 MEQ tablet Take 20 mEq by mouth daily.    . Vitamin D, Ergocalciferol, (DRISDOL) 50000 units CAPS capsule Take 1 capsule by mouth once a week.     No current facility-administered medications for this visit.         Physical Exam BP 122/63 (BP Location: Left Arm)   Pulse 83   Resp 15   Ht 5\' 2"  (1.575 m)   Wt 72.6 kg (160 lb)   BMI 29.26 kg/m  Gen:  WD/WN, NAD Skin: incision C/D/I     Assessment/Plan:  Dementia Likely a poor candidate for BKA prosthesis.  Type 2 diabetes mellitus with complication (HCC) blood glucose control important in reducing the progression of atherosclerotic disease. Also, involved in wound healing. On appropriate medications.   S/P BKA (below knee amputation), left (HCC) Wound is healing well.  Had no complications post op.  RTC about 3 months.      [name] 03/16/2017, 2:53 PM   This note was created with Dragon medical transcription system.  Any errors from dictation are unintentional.

## 2017-03-16 NOTE — Assessment & Plan Note (Signed)
Likely a poor candidate for BKA prosthesis.

## 2017-03-16 NOTE — Assessment & Plan Note (Signed)
blood glucose control important in reducing the progression of atherosclerotic disease. Also, involved in wound healing. On appropriate medications.  

## 2017-03-16 NOTE — Assessment & Plan Note (Signed)
Wound is healing well.  Had no complications post op.  RTC about 3 months.

## 2017-03-17 ENCOUNTER — Other Ambulatory Visit: Payer: Self-pay | Admitting: Pharmacist

## 2017-03-17 DIAGNOSIS — E1140 Type 2 diabetes mellitus with diabetic neuropathy, unspecified: Secondary | ICD-10-CM | POA: Diagnosis not present

## 2017-03-17 DIAGNOSIS — R278 Other lack of coordination: Secondary | ICD-10-CM | POA: Diagnosis not present

## 2017-03-17 DIAGNOSIS — L603 Nail dystrophy: Secondary | ICD-10-CM | POA: Diagnosis not present

## 2017-03-17 DIAGNOSIS — I739 Peripheral vascular disease, unspecified: Secondary | ICD-10-CM | POA: Diagnosis not present

## 2017-03-17 DIAGNOSIS — R41841 Cognitive communication deficit: Secondary | ICD-10-CM | POA: Diagnosis not present

## 2017-03-17 DIAGNOSIS — B351 Tinea unguium: Secondary | ICD-10-CM | POA: Diagnosis not present

## 2017-03-17 DIAGNOSIS — M6281 Muscle weakness (generalized): Secondary | ICD-10-CM | POA: Diagnosis not present

## 2017-03-17 DIAGNOSIS — Q845 Enlarged and hypertrophic nails: Secondary | ICD-10-CM | POA: Diagnosis not present

## 2017-03-17 DIAGNOSIS — M869 Osteomyelitis, unspecified: Secondary | ICD-10-CM | POA: Diagnosis not present

## 2017-03-17 NOTE — Patient Outreach (Signed)
[facility]) Care Management  03/17/2017  [name] [name] January 04, 1934 [id number]  This [facility] Pharmacist is covering for [facility] CM Pharmacist Colleen.  [facility] Pharmacist was following patient to assist patient with obtaining medications from mail order once patient discharged from SNF.  Per review of note from [facility] LCSW Chrystal in chart from 03/08/17, patient is pursuing long term care and still admitted to SNF.    Plan:  Will close pharmacy case at this time as patient in SNF >10 days.   If [facility] CM Pharmacist services are needed in the future, [facility] CM Pharmacy is happy to assist if patient remains eligible for services.   Tommye Standard, PharmD, [facility] Clinical Pharmacist [facility] [phone]

## 2017-03-18 DIAGNOSIS — Z5189 Encounter for other specified aftercare: Secondary | ICD-10-CM | POA: Diagnosis not present

## 2017-03-18 DIAGNOSIS — I5020 Unspecified systolic (congestive) heart failure: Secondary | ICD-10-CM | POA: Diagnosis not present

## 2017-03-18 DIAGNOSIS — M6281 Muscle weakness (generalized): Secondary | ICD-10-CM | POA: Diagnosis not present

## 2017-03-18 DIAGNOSIS — J9601 Acute respiratory failure with hypoxia: Secondary | ICD-10-CM | POA: Diagnosis not present

## 2017-03-18 DIAGNOSIS — R42 Dizziness and giddiness: Secondary | ICD-10-CM | POA: Diagnosis not present

## 2017-03-18 DIAGNOSIS — E1140 Type 2 diabetes mellitus with diabetic neuropathy, unspecified: Secondary | ICD-10-CM | POA: Diagnosis not present

## 2017-03-18 DIAGNOSIS — Z9181 History of falling: Secondary | ICD-10-CM | POA: Diagnosis not present

## 2017-03-20 DIAGNOSIS — G894 Chronic pain syndrome: Secondary | ICD-10-CM | POA: Diagnosis not present

## 2017-03-21 DIAGNOSIS — M869 Osteomyelitis, unspecified: Secondary | ICD-10-CM | POA: Diagnosis not present

## 2017-03-21 DIAGNOSIS — M6281 Muscle weakness (generalized): Secondary | ICD-10-CM | POA: Diagnosis not present

## 2017-03-21 DIAGNOSIS — R41841 Cognitive communication deficit: Secondary | ICD-10-CM | POA: Diagnosis not present

## 2017-03-21 DIAGNOSIS — R278 Other lack of coordination: Secondary | ICD-10-CM | POA: Diagnosis not present

## 2017-03-23 DIAGNOSIS — M869 Osteomyelitis, unspecified: Secondary | ICD-10-CM | POA: Diagnosis not present

## 2017-03-23 DIAGNOSIS — R41841 Cognitive communication deficit: Secondary | ICD-10-CM | POA: Diagnosis not present

## 2017-03-23 DIAGNOSIS — M6281 Muscle weakness (generalized): Secondary | ICD-10-CM | POA: Diagnosis not present

## 2017-03-23 DIAGNOSIS — R278 Other lack of coordination: Secondary | ICD-10-CM | POA: Diagnosis not present

## 2017-03-24 DIAGNOSIS — R278 Other lack of coordination: Secondary | ICD-10-CM | POA: Diagnosis not present

## 2017-03-24 DIAGNOSIS — M6281 Muscle weakness (generalized): Secondary | ICD-10-CM | POA: Diagnosis not present

## 2017-03-24 DIAGNOSIS — R41841 Cognitive communication deficit: Secondary | ICD-10-CM | POA: Diagnosis not present

## 2017-03-24 DIAGNOSIS — M869 Osteomyelitis, unspecified: Secondary | ICD-10-CM | POA: Diagnosis not present

## 2017-03-27 DIAGNOSIS — R41841 Cognitive communication deficit: Secondary | ICD-10-CM | POA: Diagnosis not present

## 2017-03-27 DIAGNOSIS — M6281 Muscle weakness (generalized): Secondary | ICD-10-CM | POA: Diagnosis not present

## 2017-03-27 DIAGNOSIS — Z79899 Other long term (current) drug therapy: Secondary | ICD-10-CM | POA: Diagnosis not present

## 2017-03-27 DIAGNOSIS — M869 Osteomyelitis, unspecified: Secondary | ICD-10-CM | POA: Diagnosis not present

## 2017-03-27 DIAGNOSIS — R278 Other lack of coordination: Secondary | ICD-10-CM | POA: Diagnosis not present

## 2017-03-28 DIAGNOSIS — M869 Osteomyelitis, unspecified: Secondary | ICD-10-CM | POA: Diagnosis not present

## 2017-03-28 DIAGNOSIS — R41841 Cognitive communication deficit: Secondary | ICD-10-CM | POA: Diagnosis not present

## 2017-03-28 DIAGNOSIS — R278 Other lack of coordination: Secondary | ICD-10-CM | POA: Diagnosis not present

## 2017-03-28 DIAGNOSIS — M6281 Muscle weakness (generalized): Secondary | ICD-10-CM | POA: Diagnosis not present

## 2017-03-29 DIAGNOSIS — L89890 Pressure ulcer of other site, unstageable: Secondary | ICD-10-CM | POA: Diagnosis not present

## 2017-03-29 DIAGNOSIS — E1140 Type 2 diabetes mellitus with diabetic neuropathy, unspecified: Secondary | ICD-10-CM | POA: Diagnosis not present

## 2017-03-29 DIAGNOSIS — M869 Osteomyelitis, unspecified: Secondary | ICD-10-CM | POA: Diagnosis not present

## 2017-03-29 DIAGNOSIS — R41841 Cognitive communication deficit: Secondary | ICD-10-CM | POA: Diagnosis not present

## 2017-03-29 DIAGNOSIS — R278 Other lack of coordination: Secondary | ICD-10-CM | POA: Diagnosis not present

## 2017-03-29 DIAGNOSIS — E1365 Other specified diabetes mellitus with hyperglycemia: Secondary | ICD-10-CM | POA: Diagnosis not present

## 2017-03-29 DIAGNOSIS — M6281 Muscle weakness (generalized): Secondary | ICD-10-CM | POA: Diagnosis not present

## 2017-03-30 DIAGNOSIS — R41841 Cognitive communication deficit: Secondary | ICD-10-CM | POA: Diagnosis not present

## 2017-03-30 DIAGNOSIS — M6281 Muscle weakness (generalized): Secondary | ICD-10-CM | POA: Diagnosis not present

## 2017-03-30 DIAGNOSIS — M869 Osteomyelitis, unspecified: Secondary | ICD-10-CM | POA: Diagnosis not present

## 2017-03-30 DIAGNOSIS — R278 Other lack of coordination: Secondary | ICD-10-CM | POA: Diagnosis not present

## 2017-03-31 DIAGNOSIS — R278 Other lack of coordination: Secondary | ICD-10-CM | POA: Diagnosis not present

## 2017-03-31 DIAGNOSIS — M869 Osteomyelitis, unspecified: Secondary | ICD-10-CM | POA: Diagnosis not present

## 2017-03-31 DIAGNOSIS — M6281 Muscle weakness (generalized): Secondary | ICD-10-CM | POA: Diagnosis not present

## 2017-03-31 DIAGNOSIS — R41841 Cognitive communication deficit: Secondary | ICD-10-CM | POA: Diagnosis not present

## 2017-04-03 DIAGNOSIS — R41841 Cognitive communication deficit: Secondary | ICD-10-CM | POA: Diagnosis not present

## 2017-04-03 DIAGNOSIS — R278 Other lack of coordination: Secondary | ICD-10-CM | POA: Diagnosis not present

## 2017-04-03 DIAGNOSIS — M869 Osteomyelitis, unspecified: Secondary | ICD-10-CM | POA: Diagnosis not present

## 2017-04-03 DIAGNOSIS — M6281 Muscle weakness (generalized): Secondary | ICD-10-CM | POA: Diagnosis not present

## 2017-04-04 DIAGNOSIS — R41841 Cognitive communication deficit: Secondary | ICD-10-CM | POA: Diagnosis not present

## 2017-04-04 DIAGNOSIS — M6281 Muscle weakness (generalized): Secondary | ICD-10-CM | POA: Diagnosis not present

## 2017-04-04 DIAGNOSIS — R278 Other lack of coordination: Secondary | ICD-10-CM | POA: Diagnosis not present

## 2017-04-04 DIAGNOSIS — M869 Osteomyelitis, unspecified: Secondary | ICD-10-CM | POA: Diagnosis not present

## 2017-04-05 DIAGNOSIS — M869 Osteomyelitis, unspecified: Secondary | ICD-10-CM | POA: Diagnosis not present

## 2017-04-05 DIAGNOSIS — M6281 Muscle weakness (generalized): Secondary | ICD-10-CM | POA: Diagnosis not present

## 2017-04-05 DIAGNOSIS — R41841 Cognitive communication deficit: Secondary | ICD-10-CM | POA: Diagnosis not present

## 2017-04-05 DIAGNOSIS — R278 Other lack of coordination: Secondary | ICD-10-CM | POA: Diagnosis not present

## 2017-04-06 DIAGNOSIS — M6281 Muscle weakness (generalized): Secondary | ICD-10-CM | POA: Diagnosis not present

## 2017-04-06 DIAGNOSIS — M869 Osteomyelitis, unspecified: Secondary | ICD-10-CM | POA: Diagnosis not present

## 2017-04-06 DIAGNOSIS — R41841 Cognitive communication deficit: Secondary | ICD-10-CM | POA: Diagnosis not present

## 2017-04-06 DIAGNOSIS — R278 Other lack of coordination: Secondary | ICD-10-CM | POA: Diagnosis not present

## 2017-04-07 DIAGNOSIS — M869 Osteomyelitis, unspecified: Secondary | ICD-10-CM | POA: Diagnosis not present

## 2017-04-07 DIAGNOSIS — R41841 Cognitive communication deficit: Secondary | ICD-10-CM | POA: Diagnosis not present

## 2017-04-07 DIAGNOSIS — M6281 Muscle weakness (generalized): Secondary | ICD-10-CM | POA: Diagnosis not present

## 2017-04-07 DIAGNOSIS — R278 Other lack of coordination: Secondary | ICD-10-CM | POA: Diagnosis not present

## 2017-04-10 DIAGNOSIS — R278 Other lack of coordination: Secondary | ICD-10-CM | POA: Diagnosis not present

## 2017-04-10 DIAGNOSIS — R41841 Cognitive communication deficit: Secondary | ICD-10-CM | POA: Diagnosis not present

## 2017-04-10 DIAGNOSIS — M6281 Muscle weakness (generalized): Secondary | ICD-10-CM | POA: Diagnosis not present

## 2017-04-10 DIAGNOSIS — M869 Osteomyelitis, unspecified: Secondary | ICD-10-CM | POA: Diagnosis not present

## 2017-04-11 DIAGNOSIS — M6281 Muscle weakness (generalized): Secondary | ICD-10-CM | POA: Diagnosis not present

## 2017-04-11 DIAGNOSIS — R278 Other lack of coordination: Secondary | ICD-10-CM | POA: Diagnosis not present

## 2017-04-11 DIAGNOSIS — R41841 Cognitive communication deficit: Secondary | ICD-10-CM | POA: Diagnosis not present

## 2017-04-11 DIAGNOSIS — M869 Osteomyelitis, unspecified: Secondary | ICD-10-CM | POA: Diagnosis not present

## 2017-04-12 DIAGNOSIS — M6281 Muscle weakness (generalized): Secondary | ICD-10-CM | POA: Diagnosis not present

## 2017-04-12 DIAGNOSIS — M869 Osteomyelitis, unspecified: Secondary | ICD-10-CM | POA: Diagnosis not present

## 2017-04-12 DIAGNOSIS — R278 Other lack of coordination: Secondary | ICD-10-CM | POA: Diagnosis not present

## 2017-04-12 DIAGNOSIS — R41841 Cognitive communication deficit: Secondary | ICD-10-CM | POA: Diagnosis not present

## 2017-04-13 DIAGNOSIS — R278 Other lack of coordination: Secondary | ICD-10-CM | POA: Diagnosis not present

## 2017-04-13 DIAGNOSIS — M869 Osteomyelitis, unspecified: Secondary | ICD-10-CM | POA: Diagnosis not present

## 2017-04-13 DIAGNOSIS — R41841 Cognitive communication deficit: Secondary | ICD-10-CM | POA: Diagnosis not present

## 2017-04-13 DIAGNOSIS — M6281 Muscle weakness (generalized): Secondary | ICD-10-CM | POA: Diagnosis not present

## 2017-04-14 DIAGNOSIS — R278 Other lack of coordination: Secondary | ICD-10-CM | POA: Diagnosis not present

## 2017-04-14 DIAGNOSIS — M6281 Muscle weakness (generalized): Secondary | ICD-10-CM | POA: Diagnosis not present

## 2017-04-14 DIAGNOSIS — R41841 Cognitive communication deficit: Secondary | ICD-10-CM | POA: Diagnosis not present

## 2017-04-14 DIAGNOSIS — G894 Chronic pain syndrome: Secondary | ICD-10-CM | POA: Diagnosis not present

## 2017-04-14 DIAGNOSIS — M869 Osteomyelitis, unspecified: Secondary | ICD-10-CM | POA: Diagnosis not present

## 2017-04-17 DIAGNOSIS — M869 Osteomyelitis, unspecified: Secondary | ICD-10-CM | POA: Diagnosis not present

## 2017-04-17 DIAGNOSIS — Z9181 History of falling: Secondary | ICD-10-CM | POA: Diagnosis not present

## 2017-04-17 DIAGNOSIS — R41841 Cognitive communication deficit: Secondary | ICD-10-CM | POA: Diagnosis not present

## 2017-04-17 DIAGNOSIS — E1140 Type 2 diabetes mellitus with diabetic neuropathy, unspecified: Secondary | ICD-10-CM | POA: Diagnosis not present

## 2017-04-17 DIAGNOSIS — R42 Dizziness and giddiness: Secondary | ICD-10-CM | POA: Diagnosis not present

## 2017-04-17 DIAGNOSIS — R278 Other lack of coordination: Secondary | ICD-10-CM | POA: Diagnosis not present

## 2017-04-17 DIAGNOSIS — Z5189 Encounter for other specified aftercare: Secondary | ICD-10-CM | POA: Diagnosis not present

## 2017-04-17 DIAGNOSIS — J9601 Acute respiratory failure with hypoxia: Secondary | ICD-10-CM | POA: Diagnosis not present

## 2017-04-17 DIAGNOSIS — M6281 Muscle weakness (generalized): Secondary | ICD-10-CM | POA: Diagnosis not present

## 2017-04-17 DIAGNOSIS — I5020 Unspecified systolic (congestive) heart failure: Secondary | ICD-10-CM | POA: Diagnosis not present

## 2017-04-18 DIAGNOSIS — R278 Other lack of coordination: Secondary | ICD-10-CM | POA: Diagnosis not present

## 2017-04-18 DIAGNOSIS — M6281 Muscle weakness (generalized): Secondary | ICD-10-CM | POA: Diagnosis not present

## 2017-04-18 DIAGNOSIS — R41841 Cognitive communication deficit: Secondary | ICD-10-CM | POA: Diagnosis not present

## 2017-04-18 DIAGNOSIS — M869 Osteomyelitis, unspecified: Secondary | ICD-10-CM | POA: Diagnosis not present

## 2017-04-19 DIAGNOSIS — M6281 Muscle weakness (generalized): Secondary | ICD-10-CM | POA: Diagnosis not present

## 2017-04-19 DIAGNOSIS — M869 Osteomyelitis, unspecified: Secondary | ICD-10-CM | POA: Diagnosis not present

## 2017-04-19 DIAGNOSIS — R278 Other lack of coordination: Secondary | ICD-10-CM | POA: Diagnosis not present

## 2017-04-19 DIAGNOSIS — R41841 Cognitive communication deficit: Secondary | ICD-10-CM | POA: Diagnosis not present

## 2017-04-20 DIAGNOSIS — R278 Other lack of coordination: Secondary | ICD-10-CM | POA: Diagnosis not present

## 2017-04-20 DIAGNOSIS — M869 Osteomyelitis, unspecified: Secondary | ICD-10-CM | POA: Diagnosis not present

## 2017-04-20 DIAGNOSIS — R41841 Cognitive communication deficit: Secondary | ICD-10-CM | POA: Diagnosis not present

## 2017-04-20 DIAGNOSIS — M6281 Muscle weakness (generalized): Secondary | ICD-10-CM | POA: Diagnosis not present

## 2017-04-21 DIAGNOSIS — M6281 Muscle weakness (generalized): Secondary | ICD-10-CM | POA: Diagnosis not present

## 2017-04-21 DIAGNOSIS — R41841 Cognitive communication deficit: Secondary | ICD-10-CM | POA: Diagnosis not present

## 2017-04-21 DIAGNOSIS — R278 Other lack of coordination: Secondary | ICD-10-CM | POA: Diagnosis not present

## 2017-04-21 DIAGNOSIS — M869 Osteomyelitis, unspecified: Secondary | ICD-10-CM | POA: Diagnosis not present

## 2017-04-24 DIAGNOSIS — M6281 Muscle weakness (generalized): Secondary | ICD-10-CM | POA: Diagnosis not present

## 2017-04-24 DIAGNOSIS — M869 Osteomyelitis, unspecified: Secondary | ICD-10-CM | POA: Diagnosis not present

## 2017-04-24 DIAGNOSIS — R278 Other lack of coordination: Secondary | ICD-10-CM | POA: Diagnosis not present

## 2017-04-24 DIAGNOSIS — R41841 Cognitive communication deficit: Secondary | ICD-10-CM | POA: Diagnosis not present

## 2017-04-24 DIAGNOSIS — E119 Type 2 diabetes mellitus without complications: Secondary | ICD-10-CM | POA: Diagnosis not present

## 2017-04-24 DIAGNOSIS — Z79899 Other long term (current) drug therapy: Secondary | ICD-10-CM | POA: Diagnosis not present

## 2017-04-25 DIAGNOSIS — R41841 Cognitive communication deficit: Secondary | ICD-10-CM | POA: Diagnosis not present

## 2017-04-25 DIAGNOSIS — E118 Type 2 diabetes mellitus with unspecified complications: Secondary | ICD-10-CM | POA: Diagnosis not present

## 2017-04-25 DIAGNOSIS — M869 Osteomyelitis, unspecified: Secondary | ICD-10-CM | POA: Diagnosis not present

## 2017-04-25 DIAGNOSIS — R278 Other lack of coordination: Secondary | ICD-10-CM | POA: Diagnosis not present

## 2017-04-25 DIAGNOSIS — N390 Urinary tract infection, site not specified: Secondary | ICD-10-CM | POA: Diagnosis not present

## 2017-04-25 DIAGNOSIS — R5383 Other fatigue: Secondary | ICD-10-CM | POA: Diagnosis not present

## 2017-04-25 DIAGNOSIS — M6281 Muscle weakness (generalized): Secondary | ICD-10-CM | POA: Diagnosis not present

## 2017-04-26 DIAGNOSIS — Z79899 Other long term (current) drug therapy: Secondary | ICD-10-CM | POA: Diagnosis not present

## 2017-04-26 DIAGNOSIS — E039 Hypothyroidism, unspecified: Secondary | ICD-10-CM | POA: Diagnosis not present

## 2017-04-26 DIAGNOSIS — M869 Osteomyelitis, unspecified: Secondary | ICD-10-CM | POA: Diagnosis not present

## 2017-04-26 DIAGNOSIS — M6281 Muscle weakness (generalized): Secondary | ICD-10-CM | POA: Diagnosis not present

## 2017-04-26 DIAGNOSIS — R278 Other lack of coordination: Secondary | ICD-10-CM | POA: Diagnosis not present

## 2017-04-26 DIAGNOSIS — R41841 Cognitive communication deficit: Secondary | ICD-10-CM | POA: Diagnosis not present

## 2017-04-27 DIAGNOSIS — M6281 Muscle weakness (generalized): Secondary | ICD-10-CM | POA: Diagnosis not present

## 2017-04-27 DIAGNOSIS — Z89511 Acquired absence of right leg below knee: Secondary | ICD-10-CM | POA: Diagnosis not present

## 2017-04-27 DIAGNOSIS — R140 Abdominal distension (gaseous): Secondary | ICD-10-CM | POA: Diagnosis not present

## 2017-04-27 DIAGNOSIS — M869 Osteomyelitis, unspecified: Secondary | ICD-10-CM | POA: Diagnosis not present

## 2017-04-27 DIAGNOSIS — R278 Other lack of coordination: Secondary | ICD-10-CM | POA: Diagnosis not present

## 2017-04-27 DIAGNOSIS — J449 Chronic obstructive pulmonary disease, unspecified: Secondary | ICD-10-CM | POA: Diagnosis not present

## 2017-04-27 DIAGNOSIS — K5900 Constipation, unspecified: Secondary | ICD-10-CM | POA: Diagnosis not present

## 2017-04-27 DIAGNOSIS — R41841 Cognitive communication deficit: Secondary | ICD-10-CM | POA: Diagnosis not present

## 2017-04-27 DIAGNOSIS — R300 Dysuria: Secondary | ICD-10-CM | POA: Diagnosis not present

## 2017-04-27 DIAGNOSIS — I10 Essential (primary) hypertension: Secondary | ICD-10-CM | POA: Diagnosis not present

## 2017-04-27 DIAGNOSIS — L89619 Pressure ulcer of right heel, unspecified stage: Secondary | ICD-10-CM | POA: Diagnosis not present

## 2017-04-27 DIAGNOSIS — R54 Age-related physical debility: Secondary | ICD-10-CM | POA: Diagnosis not present

## 2017-04-27 DIAGNOSIS — E1140 Type 2 diabetes mellitus with diabetic neuropathy, unspecified: Secondary | ICD-10-CM | POA: Diagnosis not present

## 2017-04-27 DIAGNOSIS — I739 Peripheral vascular disease, unspecified: Secondary | ICD-10-CM | POA: Diagnosis not present

## 2017-04-28 DIAGNOSIS — M6281 Muscle weakness (generalized): Secondary | ICD-10-CM | POA: Diagnosis not present

## 2017-04-28 DIAGNOSIS — M869 Osteomyelitis, unspecified: Secondary | ICD-10-CM | POA: Diagnosis not present

## 2017-04-28 DIAGNOSIS — R278 Other lack of coordination: Secondary | ICD-10-CM | POA: Diagnosis not present

## 2017-04-28 DIAGNOSIS — R41841 Cognitive communication deficit: Secondary | ICD-10-CM | POA: Diagnosis not present

## 2017-04-30 DIAGNOSIS — Z79899 Other long term (current) drug therapy: Secondary | ICD-10-CM | POA: Diagnosis not present

## 2017-05-01 DIAGNOSIS — M869 Osteomyelitis, unspecified: Secondary | ICD-10-CM | POA: Diagnosis not present

## 2017-05-01 DIAGNOSIS — R278 Other lack of coordination: Secondary | ICD-10-CM | POA: Diagnosis not present

## 2017-05-01 DIAGNOSIS — M6281 Muscle weakness (generalized): Secondary | ICD-10-CM | POA: Diagnosis not present

## 2017-05-01 DIAGNOSIS — R41841 Cognitive communication deficit: Secondary | ICD-10-CM | POA: Diagnosis not present

## 2017-05-02 DIAGNOSIS — M869 Osteomyelitis, unspecified: Secondary | ICD-10-CM | POA: Diagnosis not present

## 2017-05-02 DIAGNOSIS — M6281 Muscle weakness (generalized): Secondary | ICD-10-CM | POA: Diagnosis not present

## 2017-05-02 DIAGNOSIS — R7989 Other specified abnormal findings of blood chemistry: Secondary | ICD-10-CM | POA: Diagnosis not present

## 2017-05-02 DIAGNOSIS — R278 Other lack of coordination: Secondary | ICD-10-CM | POA: Diagnosis not present

## 2017-05-02 DIAGNOSIS — R41841 Cognitive communication deficit: Secondary | ICD-10-CM | POA: Diagnosis not present

## 2017-05-02 DIAGNOSIS — I959 Hypotension, unspecified: Secondary | ICD-10-CM | POA: Diagnosis not present

## 2017-05-02 DIAGNOSIS — N390 Urinary tract infection, site not specified: Secondary | ICD-10-CM | POA: Diagnosis not present

## 2017-05-03 DIAGNOSIS — M6281 Muscle weakness (generalized): Secondary | ICD-10-CM | POA: Diagnosis not present

## 2017-05-03 DIAGNOSIS — N390 Urinary tract infection, site not specified: Secondary | ICD-10-CM | POA: Diagnosis not present

## 2017-05-03 DIAGNOSIS — R41841 Cognitive communication deficit: Secondary | ICD-10-CM | POA: Diagnosis not present

## 2017-05-03 DIAGNOSIS — G894 Chronic pain syndrome: Secondary | ICD-10-CM | POA: Diagnosis not present

## 2017-05-03 DIAGNOSIS — M869 Osteomyelitis, unspecified: Secondary | ICD-10-CM | POA: Diagnosis not present

## 2017-05-03 DIAGNOSIS — R278 Other lack of coordination: Secondary | ICD-10-CM | POA: Diagnosis not present

## 2017-05-04 DIAGNOSIS — R41841 Cognitive communication deficit: Secondary | ICD-10-CM | POA: Diagnosis not present

## 2017-05-04 DIAGNOSIS — R278 Other lack of coordination: Secondary | ICD-10-CM | POA: Diagnosis not present

## 2017-05-04 DIAGNOSIS — M6281 Muscle weakness (generalized): Secondary | ICD-10-CM | POA: Diagnosis not present

## 2017-05-04 DIAGNOSIS — M869 Osteomyelitis, unspecified: Secondary | ICD-10-CM | POA: Diagnosis not present

## 2017-05-04 DIAGNOSIS — F0150 Vascular dementia without behavioral disturbance: Secondary | ICD-10-CM | POA: Diagnosis not present

## 2017-05-05 DIAGNOSIS — M869 Osteomyelitis, unspecified: Secondary | ICD-10-CM | POA: Diagnosis not present

## 2017-05-05 DIAGNOSIS — R278 Other lack of coordination: Secondary | ICD-10-CM | POA: Diagnosis not present

## 2017-05-05 DIAGNOSIS — R7989 Other specified abnormal findings of blood chemistry: Secondary | ICD-10-CM | POA: Diagnosis not present

## 2017-05-05 DIAGNOSIS — M6281 Muscle weakness (generalized): Secondary | ICD-10-CM | POA: Diagnosis not present

## 2017-05-05 DIAGNOSIS — R41841 Cognitive communication deficit: Secondary | ICD-10-CM | POA: Diagnosis not present

## 2017-05-08 DIAGNOSIS — M869 Osteomyelitis, unspecified: Secondary | ICD-10-CM | POA: Diagnosis not present

## 2017-05-08 DIAGNOSIS — M6281 Muscle weakness (generalized): Secondary | ICD-10-CM | POA: Diagnosis not present

## 2017-05-08 DIAGNOSIS — E860 Dehydration: Secondary | ICD-10-CM | POA: Diagnosis not present

## 2017-05-08 DIAGNOSIS — R41841 Cognitive communication deficit: Secondary | ICD-10-CM | POA: Diagnosis not present

## 2017-05-08 DIAGNOSIS — R278 Other lack of coordination: Secondary | ICD-10-CM | POA: Diagnosis not present

## 2017-05-09 DIAGNOSIS — R41841 Cognitive communication deficit: Secondary | ICD-10-CM | POA: Diagnosis not present

## 2017-05-09 DIAGNOSIS — M869 Osteomyelitis, unspecified: Secondary | ICD-10-CM | POA: Diagnosis not present

## 2017-05-09 DIAGNOSIS — M6281 Muscle weakness (generalized): Secondary | ICD-10-CM | POA: Diagnosis not present

## 2017-05-09 DIAGNOSIS — Z79899 Other long term (current) drug therapy: Secondary | ICD-10-CM | POA: Diagnosis not present

## 2017-05-09 DIAGNOSIS — R278 Other lack of coordination: Secondary | ICD-10-CM | POA: Diagnosis not present

## 2017-05-10 DIAGNOSIS — M6281 Muscle weakness (generalized): Secondary | ICD-10-CM | POA: Diagnosis not present

## 2017-05-10 DIAGNOSIS — R278 Other lack of coordination: Secondary | ICD-10-CM | POA: Diagnosis not present

## 2017-05-10 DIAGNOSIS — M869 Osteomyelitis, unspecified: Secondary | ICD-10-CM | POA: Diagnosis not present

## 2017-05-10 DIAGNOSIS — R41841 Cognitive communication deficit: Secondary | ICD-10-CM | POA: Diagnosis not present

## 2017-05-11 DIAGNOSIS — Z79899 Other long term (current) drug therapy: Secondary | ICD-10-CM | POA: Diagnosis not present

## 2017-05-11 DIAGNOSIS — M869 Osteomyelitis, unspecified: Secondary | ICD-10-CM | POA: Diagnosis not present

## 2017-05-11 DIAGNOSIS — R41841 Cognitive communication deficit: Secondary | ICD-10-CM | POA: Diagnosis not present

## 2017-05-11 DIAGNOSIS — R278 Other lack of coordination: Secondary | ICD-10-CM | POA: Diagnosis not present

## 2017-05-11 DIAGNOSIS — M6281 Muscle weakness (generalized): Secondary | ICD-10-CM | POA: Diagnosis not present

## 2017-05-12 DIAGNOSIS — R41841 Cognitive communication deficit: Secondary | ICD-10-CM | POA: Diagnosis not present

## 2017-05-12 DIAGNOSIS — M869 Osteomyelitis, unspecified: Secondary | ICD-10-CM | POA: Diagnosis not present

## 2017-05-12 DIAGNOSIS — R278 Other lack of coordination: Secondary | ICD-10-CM | POA: Diagnosis not present

## 2017-05-12 DIAGNOSIS — M6281 Muscle weakness (generalized): Secondary | ICD-10-CM | POA: Diagnosis not present

## 2017-05-13 DIAGNOSIS — N179 Acute kidney failure, unspecified: Secondary | ICD-10-CM | POA: Diagnosis not present

## 2017-05-13 DIAGNOSIS — L89612 Pressure ulcer of right heel, stage 2: Secondary | ICD-10-CM | POA: Diagnosis not present

## 2017-05-13 DIAGNOSIS — F0390 Unspecified dementia without behavioral disturbance: Secondary | ICD-10-CM | POA: Diagnosis not present

## 2017-05-13 DIAGNOSIS — Z89512 Acquired absence of left leg below knee: Secondary | ICD-10-CM | POA: Diagnosis not present

## 2017-05-13 DIAGNOSIS — E1310 Other specified diabetes mellitus with ketoacidosis without coma: Secondary | ICD-10-CM | POA: Diagnosis not present

## 2017-05-13 DIAGNOSIS — L89312 Pressure ulcer of right buttock, stage 2: Secondary | ICD-10-CM | POA: Diagnosis not present

## 2017-05-13 DIAGNOSIS — E860 Dehydration: Secondary | ICD-10-CM | POA: Diagnosis not present

## 2017-05-13 DIAGNOSIS — M869 Osteomyelitis, unspecified: Secondary | ICD-10-CM | POA: Diagnosis not present

## 2017-05-13 DIAGNOSIS — R41841 Cognitive communication deficit: Secondary | ICD-10-CM | POA: Diagnosis not present

## 2017-05-13 DIAGNOSIS — R404 Transient alteration of awareness: Secondary | ICD-10-CM | POA: Diagnosis not present

## 2017-05-13 DIAGNOSIS — E870 Hyperosmolality and hypernatremia: Secondary | ICD-10-CM | POA: Diagnosis not present

## 2017-05-13 DIAGNOSIS — E1101 Type 2 diabetes mellitus with hyperosmolarity with coma: Secondary | ICD-10-CM | POA: Diagnosis not present

## 2017-05-13 DIAGNOSIS — R278 Other lack of coordination: Secondary | ICD-10-CM | POA: Diagnosis not present

## 2017-05-13 DIAGNOSIS — Z794 Long term (current) use of insulin: Secondary | ICD-10-CM | POA: Diagnosis not present

## 2017-05-13 DIAGNOSIS — R6889 Other general symptoms and signs: Secondary | ICD-10-CM | POA: Diagnosis not present

## 2017-05-13 DIAGNOSIS — E1110 Type 2 diabetes mellitus with ketoacidosis without coma: Secondary | ICD-10-CM | POA: Diagnosis not present

## 2017-05-13 DIAGNOSIS — M6281 Muscle weakness (generalized): Secondary | ICD-10-CM | POA: Diagnosis not present

## 2017-05-16 DIAGNOSIS — L89619 Pressure ulcer of right heel, unspecified stage: Secondary | ICD-10-CM | POA: Diagnosis not present

## 2017-05-16 DIAGNOSIS — R41841 Cognitive communication deficit: Secondary | ICD-10-CM | POA: Diagnosis not present

## 2017-05-16 DIAGNOSIS — I739 Peripheral vascular disease, unspecified: Secondary | ICD-10-CM | POA: Diagnosis not present

## 2017-05-16 DIAGNOSIS — R300 Dysuria: Secondary | ICD-10-CM | POA: Diagnosis not present

## 2017-05-16 DIAGNOSIS — R278 Other lack of coordination: Secondary | ICD-10-CM | POA: Diagnosis not present

## 2017-05-16 DIAGNOSIS — E1140 Type 2 diabetes mellitus with diabetic neuropathy, unspecified: Secondary | ICD-10-CM | POA: Diagnosis not present

## 2017-05-16 DIAGNOSIS — Z89511 Acquired absence of right leg below knee: Secondary | ICD-10-CM | POA: Diagnosis not present

## 2017-05-16 DIAGNOSIS — R54 Age-related physical debility: Secondary | ICD-10-CM | POA: Diagnosis not present

## 2017-05-16 DIAGNOSIS — R140 Abdominal distension (gaseous): Secondary | ICD-10-CM | POA: Diagnosis not present

## 2017-05-16 DIAGNOSIS — M869 Osteomyelitis, unspecified: Secondary | ICD-10-CM | POA: Diagnosis not present

## 2017-05-16 DIAGNOSIS — M6281 Muscle weakness (generalized): Secondary | ICD-10-CM | POA: Diagnosis not present

## 2017-05-16 DIAGNOSIS — J449 Chronic obstructive pulmonary disease, unspecified: Secondary | ICD-10-CM | POA: Diagnosis not present

## 2017-05-16 DIAGNOSIS — I10 Essential (primary) hypertension: Secondary | ICD-10-CM | POA: Diagnosis not present

## 2017-05-18 DIAGNOSIS — Z5189 Encounter for other specified aftercare: Secondary | ICD-10-CM | POA: Diagnosis not present

## 2017-05-18 DIAGNOSIS — M6281 Muscle weakness (generalized): Secondary | ICD-10-CM | POA: Diagnosis not present

## 2017-05-18 DIAGNOSIS — E119 Type 2 diabetes mellitus without complications: Secondary | ICD-10-CM | POA: Diagnosis not present

## 2017-05-18 DIAGNOSIS — R42 Dizziness and giddiness: Secondary | ICD-10-CM | POA: Diagnosis not present

## 2017-05-18 DIAGNOSIS — I5020 Unspecified systolic (congestive) heart failure: Secondary | ICD-10-CM | POA: Diagnosis not present

## 2017-05-18 DIAGNOSIS — J9601 Acute respiratory failure with hypoxia: Secondary | ICD-10-CM | POA: Diagnosis not present

## 2017-05-18 DIAGNOSIS — Z9181 History of falling: Secondary | ICD-10-CM | POA: Diagnosis not present

## 2017-05-18 DIAGNOSIS — E1140 Type 2 diabetes mellitus with diabetic neuropathy, unspecified: Secondary | ICD-10-CM | POA: Diagnosis not present

## 2017-05-19 DIAGNOSIS — R300 Dysuria: Secondary | ICD-10-CM | POA: Diagnosis not present

## 2017-05-19 DIAGNOSIS — I10 Essential (primary) hypertension: Secondary | ICD-10-CM | POA: Diagnosis not present

## 2017-05-19 DIAGNOSIS — R140 Abdominal distension (gaseous): Secondary | ICD-10-CM | POA: Diagnosis not present

## 2017-05-19 DIAGNOSIS — I739 Peripheral vascular disease, unspecified: Secondary | ICD-10-CM | POA: Diagnosis not present

## 2017-05-19 DIAGNOSIS — L89619 Pressure ulcer of right heel, unspecified stage: Secondary | ICD-10-CM | POA: Diagnosis not present

## 2017-05-19 DIAGNOSIS — J449 Chronic obstructive pulmonary disease, unspecified: Secondary | ICD-10-CM | POA: Diagnosis not present

## 2017-05-19 DIAGNOSIS — R54 Age-related physical debility: Secondary | ICD-10-CM | POA: Diagnosis not present

## 2017-05-19 DIAGNOSIS — Z89511 Acquired absence of right leg below knee: Secondary | ICD-10-CM | POA: Diagnosis not present

## 2017-05-19 DIAGNOSIS — E1140 Type 2 diabetes mellitus with diabetic neuropathy, unspecified: Secondary | ICD-10-CM | POA: Diagnosis not present

## 2017-05-21 DIAGNOSIS — N390 Urinary tract infection, site not specified: Secondary | ICD-10-CM | POA: Diagnosis not present

## 2017-05-21 DIAGNOSIS — E43 Unspecified severe protein-calorie malnutrition: Secondary | ICD-10-CM | POA: Diagnosis not present

## 2017-05-21 DIAGNOSIS — I110 Hypertensive heart disease with heart failure: Secondary | ICD-10-CM | POA: Diagnosis not present

## 2017-05-21 DIAGNOSIS — G931 Anoxic brain damage, not elsewhere classified: Secondary | ICD-10-CM | POA: Diagnosis not present

## 2017-05-21 DIAGNOSIS — E860 Dehydration: Secondary | ICD-10-CM | POA: Diagnosis not present

## 2017-05-21 DIAGNOSIS — R404 Transient alteration of awareness: Secondary | ICD-10-CM | POA: Diagnosis not present

## 2017-05-21 DIAGNOSIS — I509 Heart failure, unspecified: Secondary | ICD-10-CM | POA: Diagnosis not present

## 2017-05-21 DIAGNOSIS — R402411 Glasgow coma scale score 13-15, in the field [EMT or ambulance]: Secondary | ICD-10-CM | POA: Diagnosis not present

## 2017-05-21 DIAGNOSIS — F0390 Unspecified dementia without behavioral disturbance: Secondary | ICD-10-CM | POA: Diagnosis not present

## 2017-05-21 DIAGNOSIS — E162 Hypoglycemia, unspecified: Secondary | ICD-10-CM | POA: Diagnosis not present

## 2017-05-21 DIAGNOSIS — K625 Hemorrhage of anus and rectum: Secondary | ICD-10-CM | POA: Diagnosis not present

## 2017-05-21 DIAGNOSIS — G9341 Metabolic encephalopathy: Secondary | ICD-10-CM | POA: Diagnosis not present

## 2017-05-21 DIAGNOSIS — K626 Ulcer of anus and rectum: Secondary | ICD-10-CM | POA: Diagnosis not present

## 2017-05-21 DIAGNOSIS — T83511A Infection and inflammatory reaction due to indwelling urethral catheter, initial encounter: Secondary | ICD-10-CM | POA: Diagnosis not present

## 2017-05-21 DIAGNOSIS — J9601 Acute respiratory failure with hypoxia: Secondary | ICD-10-CM | POA: Diagnosis not present

## 2017-05-21 DIAGNOSIS — E785 Hyperlipidemia, unspecified: Secondary | ICD-10-CM | POA: Diagnosis not present

## 2017-05-21 DIAGNOSIS — R6889 Other general symptoms and signs: Secondary | ICD-10-CM | POA: Diagnosis not present

## 2017-05-21 DIAGNOSIS — R1310 Dysphagia, unspecified: Secondary | ICD-10-CM | POA: Diagnosis not present

## 2017-05-21 DIAGNOSIS — I6789 Other cerebrovascular disease: Secondary | ICD-10-CM | POA: Diagnosis not present

## 2017-05-21 DIAGNOSIS — F0150 Vascular dementia without behavioral disturbance: Secondary | ICD-10-CM | POA: Diagnosis not present

## 2017-05-21 DIAGNOSIS — R532 Functional quadriplegia: Secondary | ICD-10-CM | POA: Diagnosis not present

## 2017-05-21 DIAGNOSIS — A419 Sepsis, unspecified organism: Secondary | ICD-10-CM | POA: Diagnosis not present

## 2017-05-21 DIAGNOSIS — J690 Pneumonitis due to inhalation of food and vomit: Secondary | ICD-10-CM | POA: Diagnosis not present

## 2017-05-21 DIAGNOSIS — K5732 Diverticulitis of large intestine without perforation or abscess without bleeding: Secondary | ICD-10-CM | POA: Diagnosis not present

## 2017-05-21 DIAGNOSIS — D500 Iron deficiency anemia secondary to blood loss (chronic): Secondary | ICD-10-CM | POA: Diagnosis not present

## 2017-05-21 DIAGNOSIS — K6289 Other specified diseases of anus and rectum: Secondary | ICD-10-CM | POA: Diagnosis not present

## 2017-05-21 DIAGNOSIS — E870 Hyperosmolality and hypernatremia: Secondary | ICD-10-CM | POA: Diagnosis not present

## 2017-05-21 DIAGNOSIS — L89153 Pressure ulcer of sacral region, stage 3: Secondary | ICD-10-CM | POA: Diagnosis not present

## 2017-06-16 ENCOUNTER — Ambulatory Visit (INDEPENDENT_AMBULATORY_CARE_PROVIDER_SITE_OTHER): Payer: Medicare HMO | Admitting: Vascular Surgery

## 2017-06-16 ENCOUNTER — Other Ambulatory Visit (INDEPENDENT_AMBULATORY_CARE_PROVIDER_SITE_OTHER): Payer: Self-pay | Admitting: Vascular Surgery

## 2017-06-16 ENCOUNTER — Encounter (INDEPENDENT_AMBULATORY_CARE_PROVIDER_SITE_OTHER): Payer: Medicare HMO

## 2017-06-16 DIAGNOSIS — I739 Peripheral vascular disease, unspecified: Secondary | ICD-10-CM

## 2017-06-16 DIAGNOSIS — Z89512 Acquired absence of left leg below knee: Secondary | ICD-10-CM

## 2017-06-17 DIAGNOSIS — J9601 Acute respiratory failure with hypoxia: Secondary | ICD-10-CM | POA: Diagnosis not present

## 2017-06-17 DIAGNOSIS — Z5189 Encounter for other specified aftercare: Secondary | ICD-10-CM | POA: Diagnosis not present

## 2017-06-17 DIAGNOSIS — I5020 Unspecified systolic (congestive) heart failure: Secondary | ICD-10-CM | POA: Diagnosis not present

## 2017-06-17 DIAGNOSIS — M6281 Muscle weakness (generalized): Secondary | ICD-10-CM | POA: Diagnosis not present

## 2017-06-17 DIAGNOSIS — E1140 Type 2 diabetes mellitus with diabetic neuropathy, unspecified: Secondary | ICD-10-CM | POA: Diagnosis not present

## 2017-06-17 DIAGNOSIS — Z9181 History of falling: Secondary | ICD-10-CM | POA: Diagnosis not present

## 2017-06-17 DIAGNOSIS — R42 Dizziness and giddiness: Secondary | ICD-10-CM | POA: Diagnosis not present

## 2017-06-18 DEATH — deceased

## 2017-11-20 IMAGING — DX DG CHEST 1V PORT
1 series · 1 of 1 positions shown · non-contrast
Comparison: 09/15/2016

CLINICAL DATA: PICC line placement

EXAM:
PORTABLE CHEST 1 VIEW

[chest ap]
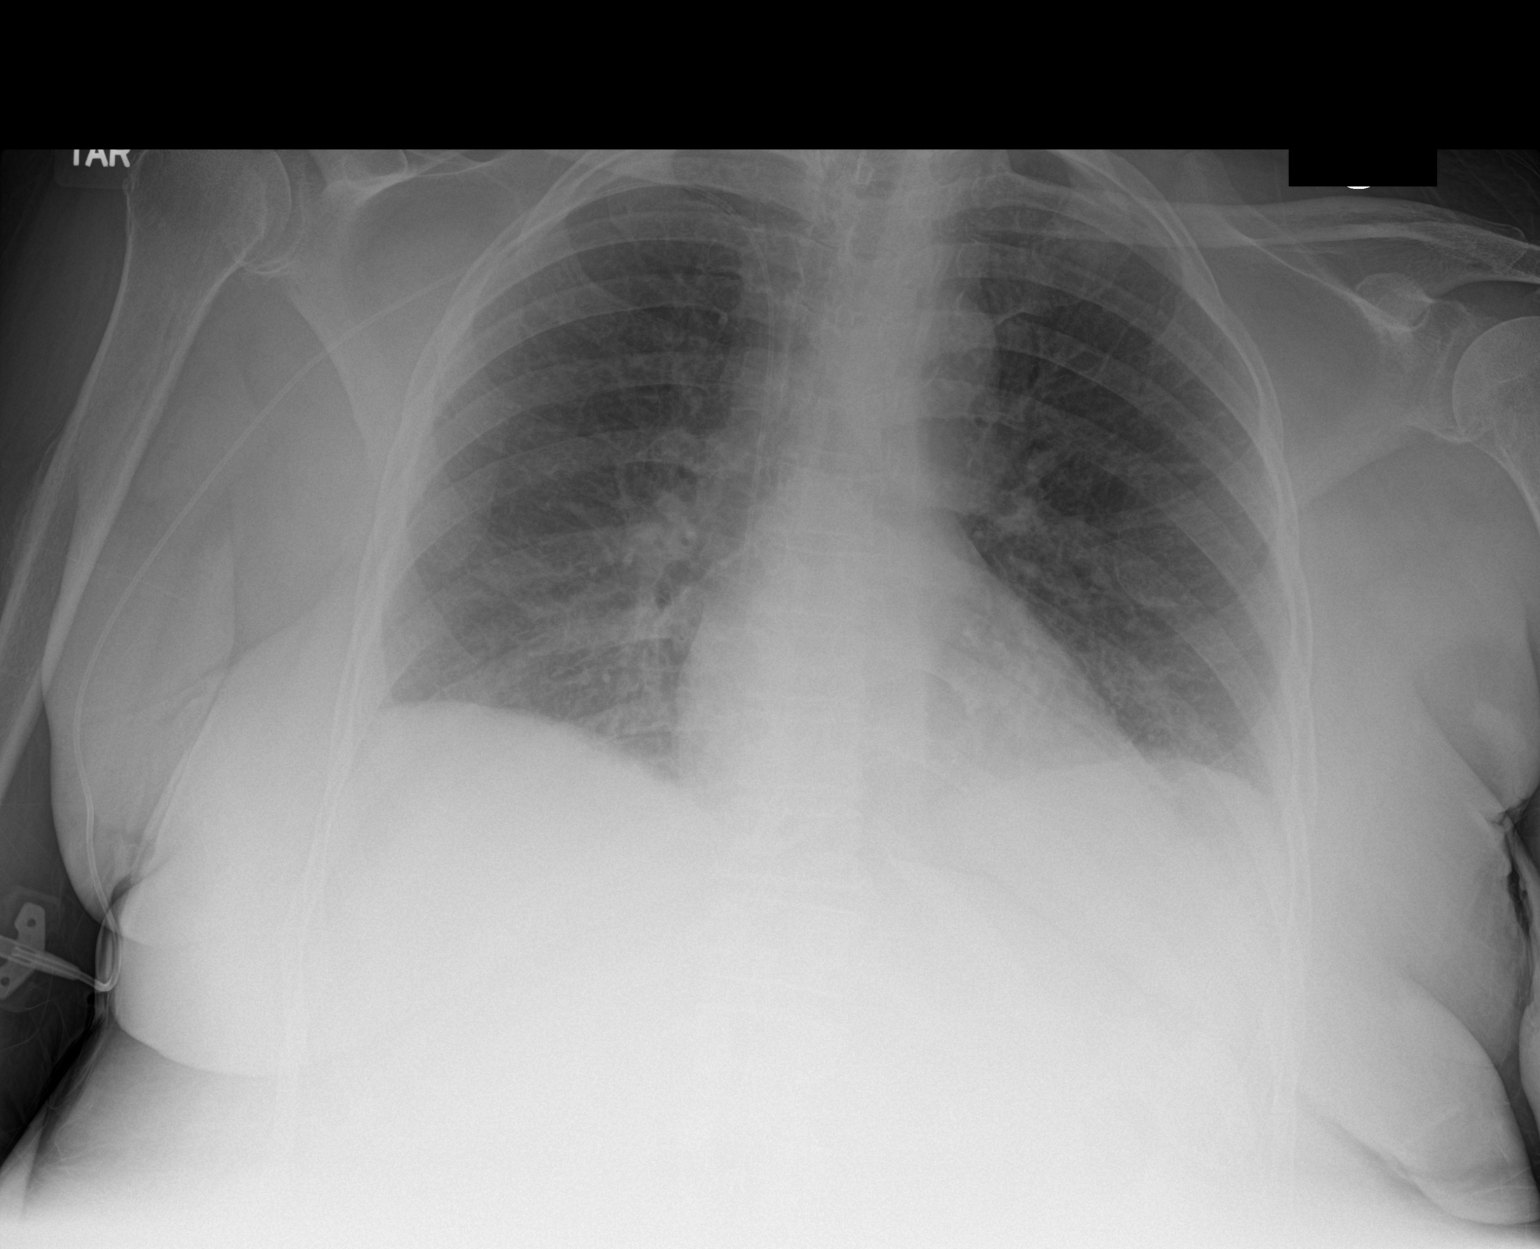

[1 of 1 positions shown; findings below may reference images not displayed]

FINDINGS: Cardiomediastinal silhouette is unremarkable. No infiltrate or
pulmonary edema. There is right arm PICC line with tip in SVC right
atrium junction. No pneumothorax.
IMPRESSION: Right arm PICC line in place.  No pneumothorax.

## 2017-11-26 IMAGING — DX DG FOOT COMPLETE 3+V*L*
3 series · 3 of 3 positions shown · non-contrast
Comparison: 11/09/2016, 11/11/2016

CLINICAL DATA: Possible sepsis.  Left foot ulcer.

EXAM:
LEFT FOOT - COMPLETE 3+ VIEW

[foot ap]
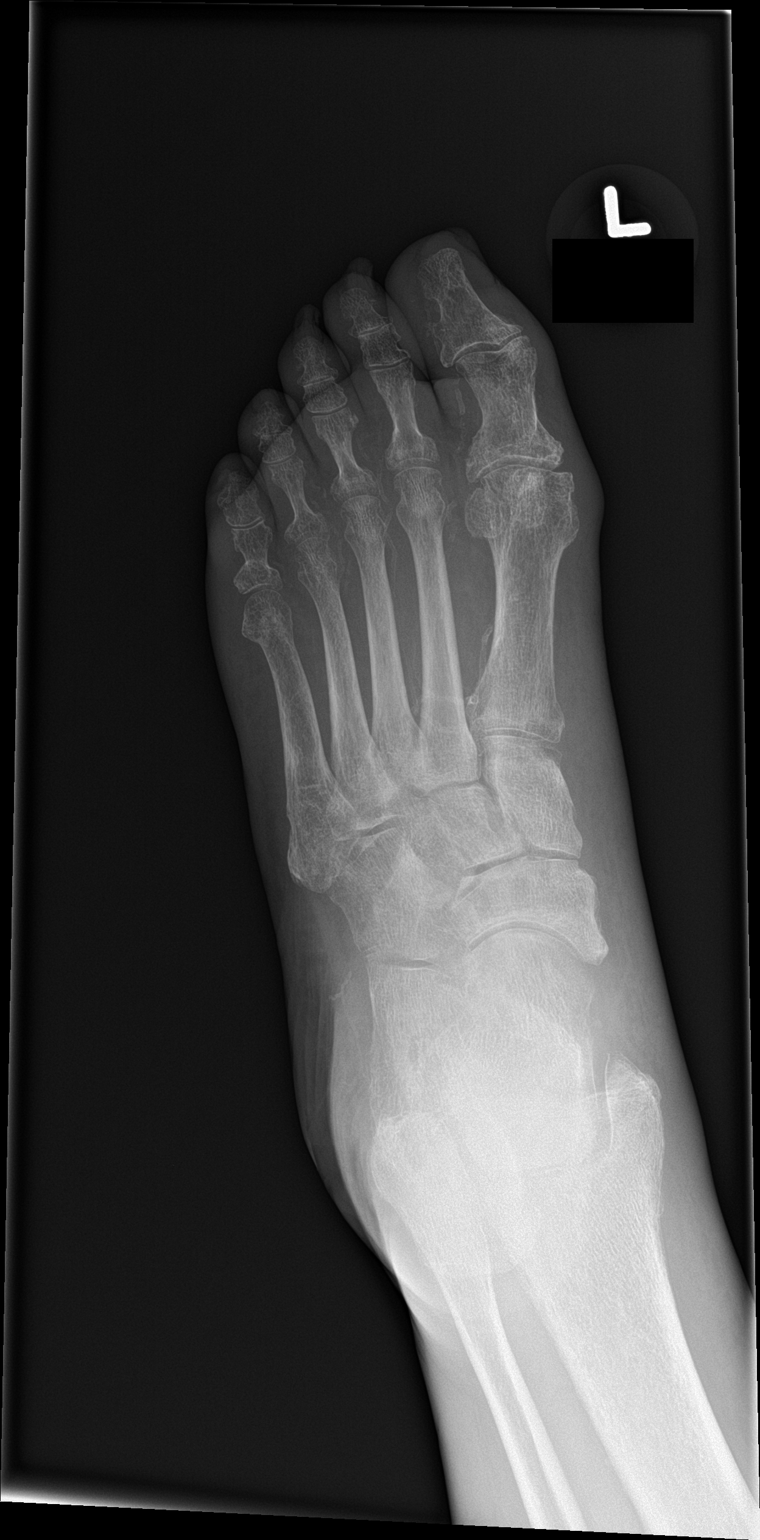

[foot obl]
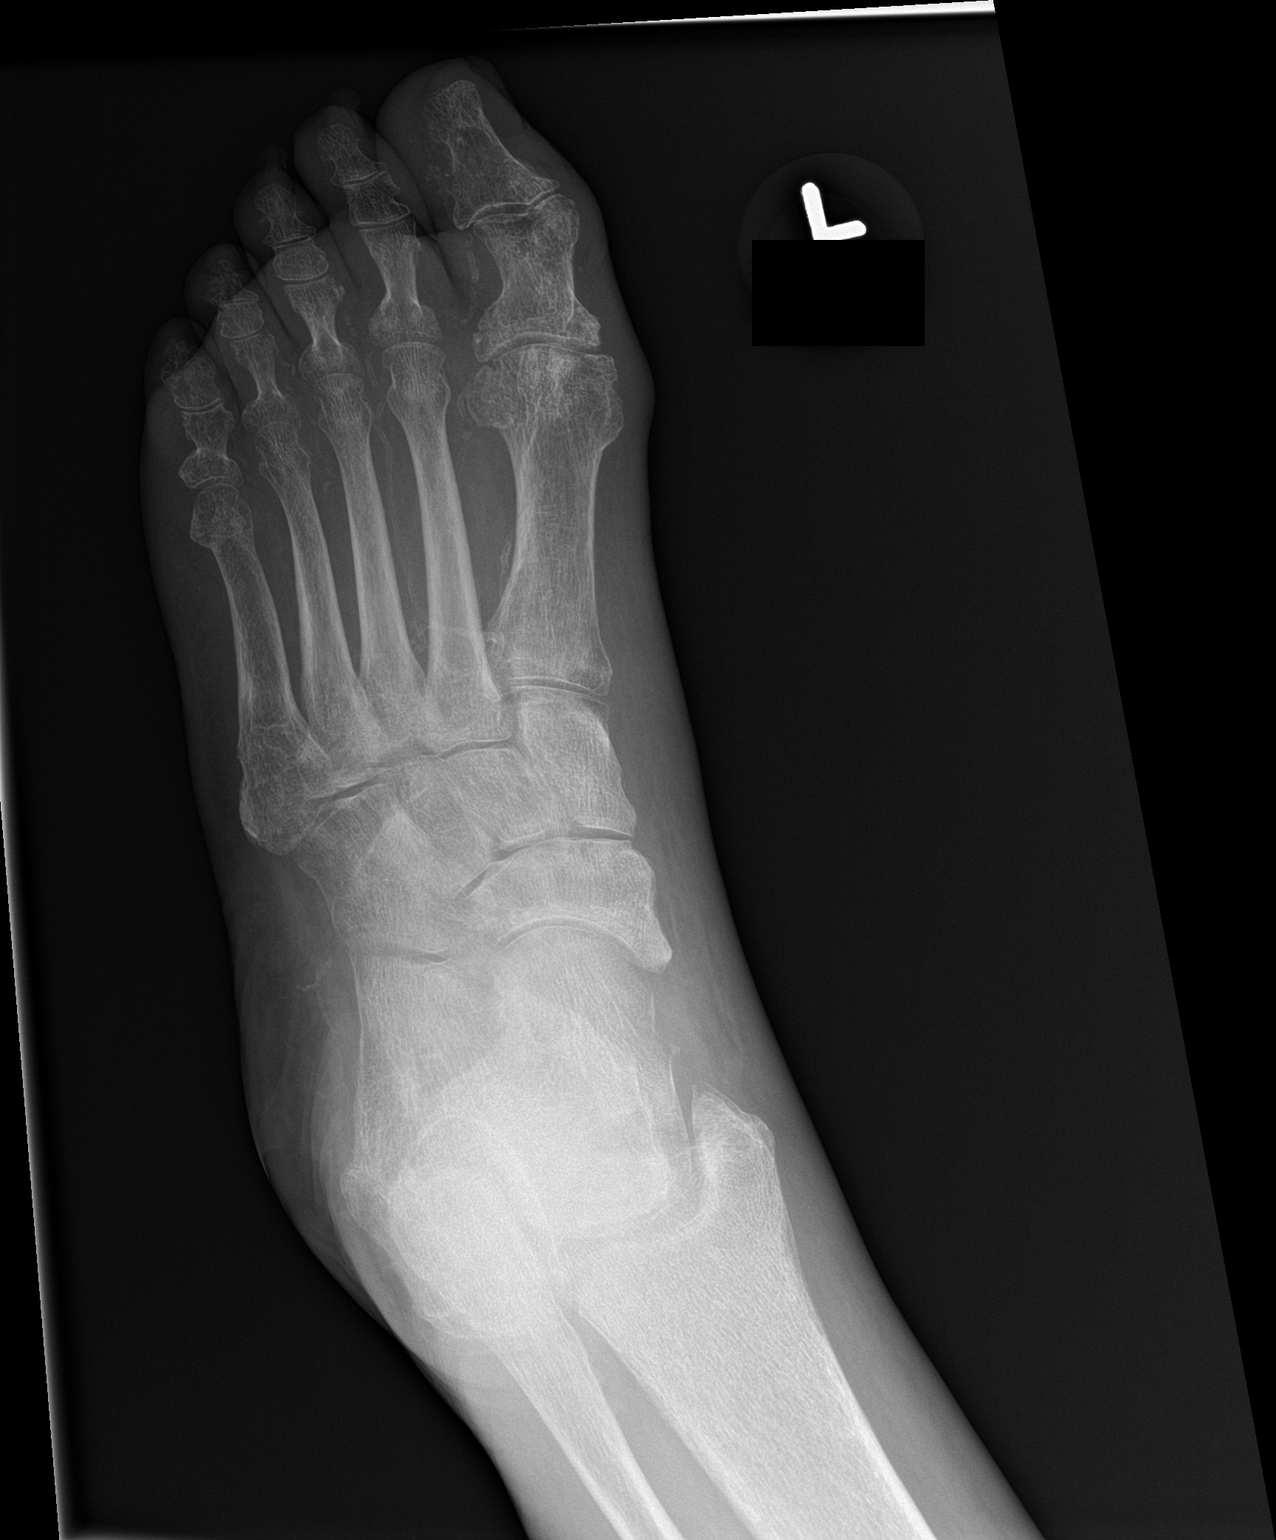

[foot lat]
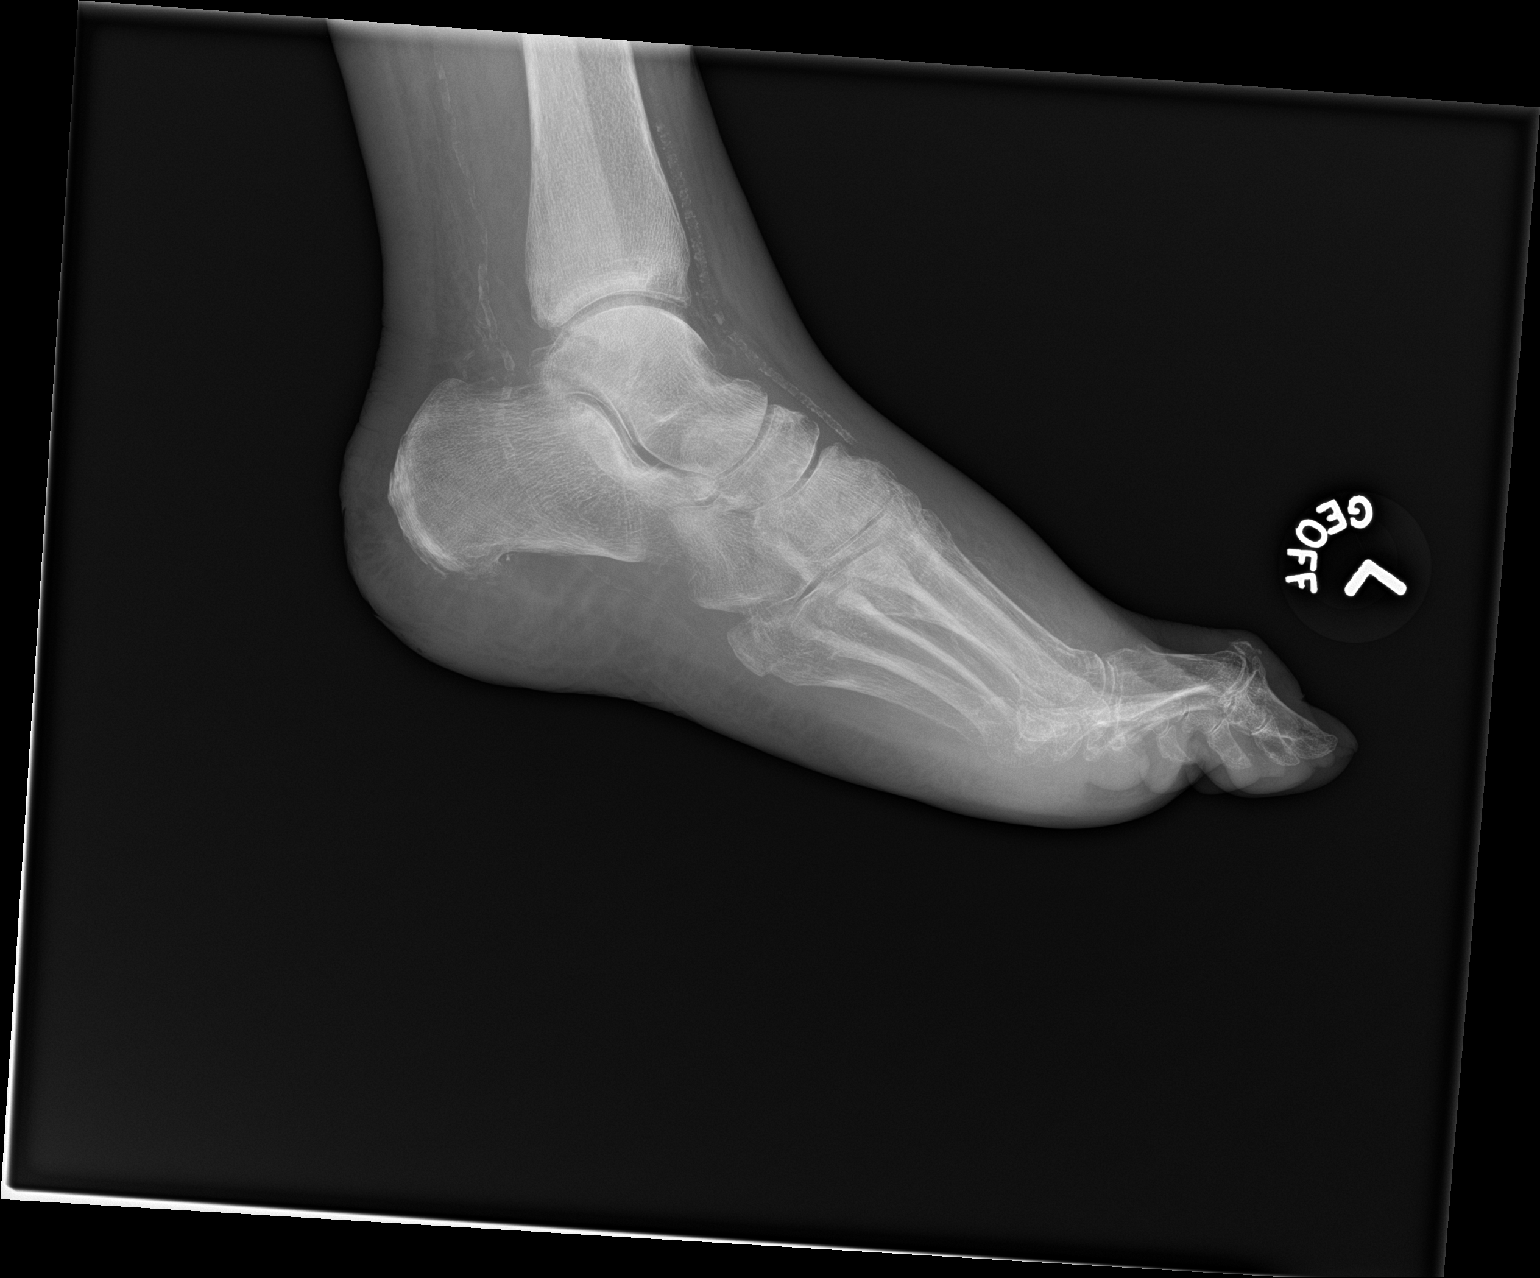

[3 of 3 positions shown; findings below may reference images not displayed]

FINDINGS: Negative for acute fracture or dislocation. No radiopaque foreign
body. No bony destruction.

No soft tissue gas.
IMPRESSION: No acute findings. No radiographic evidence of osteomyelitis. No
soft tissue gas.

## 2017-11-27 IMAGING — DX DG CHEST 1V PORT
1 series · 1 of 1 positions shown · non-contrast
Comparison: 11/20/2016 chest x-ray.

CLINICAL DATA: 82-year-old female with pulmonary edema. Subsequent
encounter.

EXAM:
PORTABLE CHEST 1 VIEW

[chest ap]
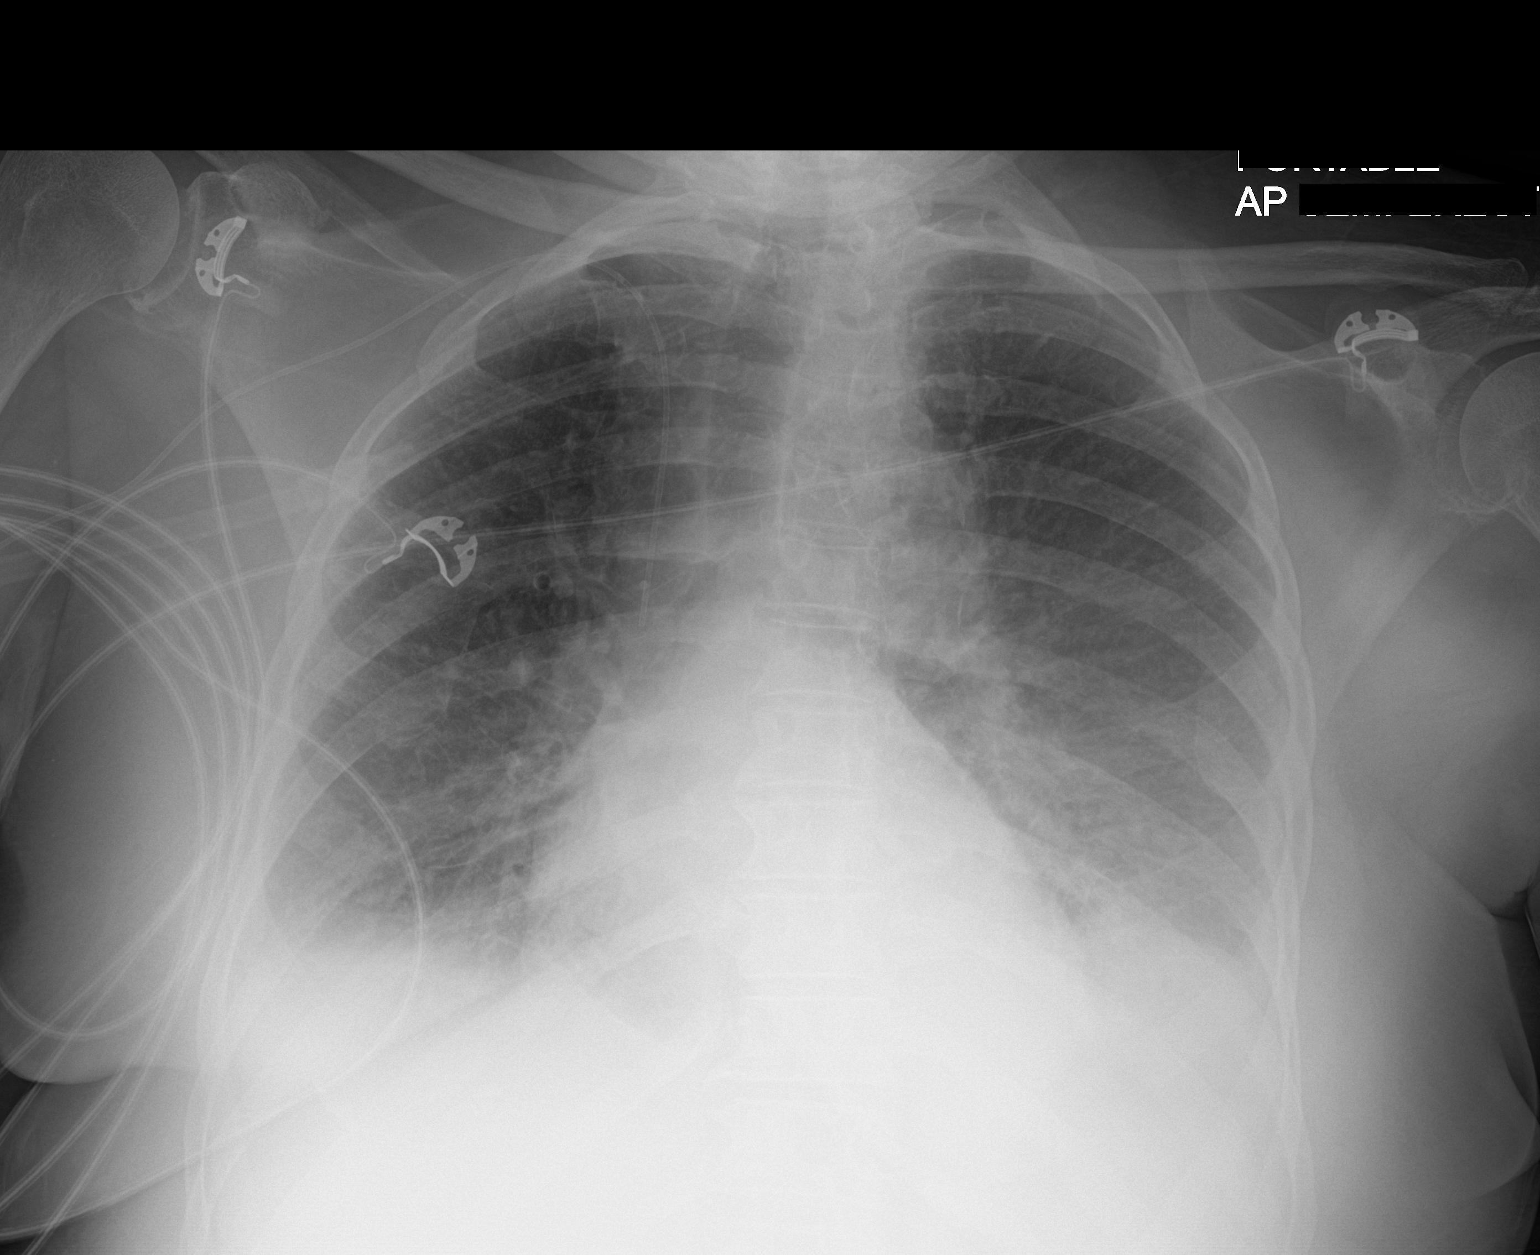

[1 of 1 positions shown; findings below may reference images not displayed]

FINDINGS: Asymmetric airspace disease slightly greater on the left minimally
changed from prior examination which may represent pulmonary edema
with pleural effusions.

Cannot exclude basilar infiltrate in the proper clinical setting.

No gross pneumothorax.

Right PICC line tip mid superior vena cava level.

Heart size top-normal.
IMPRESSION: Asymmetric airspace disease slightly greater on the left minimally
changed from prior examination which may represent pulmonary edema
with pleural effusions.

Cannot exclude basilar infiltrate in the proper clinical setting.
